# Patient Record
Sex: Female | Born: 1975 | Race: Black or African American | Hispanic: No | Marital: Married | Smoking: Current every day smoker
Health system: Southern US, Community
[De-identification: ages and names within clinical notes are randomized; demographics above are authoritative.]

## PROBLEM LIST (undated history)

## (undated) ENCOUNTER — Emergency Department (HOSPITAL_COMMUNITY)

## (undated) DIAGNOSIS — R42 Dizziness and giddiness: Secondary | ICD-10-CM

## (undated) DIAGNOSIS — R55 Syncope and collapse: Secondary | ICD-10-CM

## (undated) DIAGNOSIS — K219 Gastro-esophageal reflux disease without esophagitis: Secondary | ICD-10-CM

## (undated) DIAGNOSIS — R350 Frequency of micturition: Secondary | ICD-10-CM

## (undated) DIAGNOSIS — G08 Intracranial and intraspinal phlebitis and thrombophlebitis: Secondary | ICD-10-CM

## (undated) DIAGNOSIS — J189 Pneumonia, unspecified organism: Secondary | ICD-10-CM

## (undated) DIAGNOSIS — O24419 Gestational diabetes mellitus in pregnancy, unspecified control: Secondary | ICD-10-CM

## (undated) DIAGNOSIS — I2510 Atherosclerotic heart disease of native coronary artery without angina pectoris: Secondary | ICD-10-CM

## (undated) DIAGNOSIS — N921 Excessive and frequent menstruation with irregular cycle: Secondary | ICD-10-CM

## (undated) DIAGNOSIS — I213 ST elevation (STEMI) myocardial infarction of unspecified site: Secondary | ICD-10-CM

## (undated) DIAGNOSIS — I249 Acute ischemic heart disease, unspecified: Secondary | ICD-10-CM

## (undated) DIAGNOSIS — D573 Sickle-cell trait: Secondary | ICD-10-CM

## (undated) DIAGNOSIS — R011 Cardiac murmur, unspecified: Secondary | ICD-10-CM

## (undated) DIAGNOSIS — I5022 Chronic systolic (congestive) heart failure: Secondary | ICD-10-CM

## (undated) DIAGNOSIS — F17200 Nicotine dependence, unspecified, uncomplicated: Secondary | ICD-10-CM

## (undated) DIAGNOSIS — K8020 Calculus of gallbladder without cholecystitis without obstruction: Secondary | ICD-10-CM

## (undated) DIAGNOSIS — H5500 Unspecified nystagmus: Secondary | ICD-10-CM

## (undated) DIAGNOSIS — R070 Pain in throat: Secondary | ICD-10-CM

## (undated) DIAGNOSIS — I10 Essential (primary) hypertension: Secondary | ICD-10-CM

## (undated) DIAGNOSIS — J069 Acute upper respiratory infection, unspecified: Secondary | ICD-10-CM

## (undated) DIAGNOSIS — B351 Tinea unguium: Secondary | ICD-10-CM

## (undated) DIAGNOSIS — E669 Obesity, unspecified: Secondary | ICD-10-CM

## (undated) DIAGNOSIS — G43909 Migraine, unspecified, not intractable, without status migrainosus: Secondary | ICD-10-CM

## (undated) HISTORY — DX: Calculus of gallbladder without cholecystitis without obstruction: K80.20

## (undated) HISTORY — DX: Acute upper respiratory infection, unspecified: J06.9

## (undated) HISTORY — DX: Tinea unguium: B35.1

## (undated) HISTORY — DX: Nicotine dependence, unspecified, uncomplicated: F17.200

## (undated) HISTORY — DX: Morbid (severe) obesity due to excess calories: E66.01

## (undated) HISTORY — DX: Syncope and collapse: R55

## (undated) HISTORY — DX: ST elevation (STEMI) myocardial infarction of unspecified site: I21.3

## (undated) HISTORY — DX: Dizziness and giddiness: R42

## (undated) HISTORY — DX: Chronic systolic (congestive) heart failure: I50.22

## (undated) HISTORY — DX: Pain in throat: R07.0

## (undated) HISTORY — DX: Unspecified nystagmus: H55.00

## (undated) HISTORY — DX: Excessive and frequent menstruation with irregular cycle: N92.1

## (undated) HISTORY — DX: Frequency of micturition: R35.0

## (undated) HISTORY — DX: Obesity, unspecified: E66.9

---

## 1996-04-19 HISTORY — PX: WISDOM TOOTH EXTRACTION: SHX21

## 1997-07-25 ENCOUNTER — Emergency Department (HOSPITAL_COMMUNITY): Admission: EM | Admit: 1997-07-25 | Discharge: 1997-07-25 | Payer: Self-pay | Admitting: Emergency Medicine

## 1997-10-09 ENCOUNTER — Encounter: Admission: RE | Admit: 1997-10-09 | Discharge: 1997-10-09 | Payer: Self-pay | Admitting: Family Medicine

## 1997-10-22 ENCOUNTER — Encounter: Admission: RE | Admit: 1997-10-22 | Discharge: 1997-10-22 | Payer: Self-pay | Admitting: Family Medicine

## 1998-02-21 ENCOUNTER — Encounter: Admission: RE | Admit: 1998-02-21 | Discharge: 1998-02-21 | Payer: Self-pay | Admitting: Family Medicine

## 1998-05-07 ENCOUNTER — Encounter: Admission: RE | Admit: 1998-05-07 | Discharge: 1998-05-07 | Payer: Self-pay | Admitting: Family Medicine

## 1998-05-22 ENCOUNTER — Encounter: Admission: RE | Admit: 1998-05-22 | Discharge: 1998-05-22 | Payer: Self-pay | Admitting: Family Medicine

## 1998-06-02 ENCOUNTER — Encounter: Admission: RE | Admit: 1998-06-02 | Discharge: 1998-06-02 | Payer: Self-pay | Admitting: Sports Medicine

## 1998-06-26 ENCOUNTER — Inpatient Hospital Stay (HOSPITAL_COMMUNITY): Admission: AD | Admit: 1998-06-26 | Discharge: 1998-06-26 | Payer: Self-pay | Admitting: Obstetrics & Gynecology

## 1998-07-08 ENCOUNTER — Inpatient Hospital Stay (HOSPITAL_COMMUNITY): Admission: AD | Admit: 1998-07-08 | Discharge: 1998-07-08 | Payer: Self-pay | Admitting: *Deleted

## 1998-07-16 ENCOUNTER — Encounter: Admission: RE | Admit: 1998-07-16 | Discharge: 1998-07-16 | Payer: Self-pay | Admitting: Family Medicine

## 1998-07-16 ENCOUNTER — Other Ambulatory Visit: Admission: RE | Admit: 1998-07-16 | Discharge: 1998-07-16 | Payer: Self-pay | Admitting: Obstetrics & Gynecology

## 1998-07-23 ENCOUNTER — Encounter: Admission: RE | Admit: 1998-07-23 | Discharge: 1998-07-23 | Payer: Self-pay | Admitting: Family Medicine

## 1998-09-12 ENCOUNTER — Inpatient Hospital Stay (HOSPITAL_COMMUNITY): Admission: AD | Admit: 1998-09-12 | Discharge: 1998-09-12 | Payer: Self-pay | Admitting: Obstetrics & Gynecology

## 1998-10-06 ENCOUNTER — Emergency Department (HOSPITAL_COMMUNITY): Admission: EM | Admit: 1998-10-06 | Discharge: 1998-10-06 | Payer: Self-pay | Admitting: Emergency Medicine

## 1998-11-06 ENCOUNTER — Encounter: Admission: RE | Admit: 1998-11-06 | Discharge: 1998-11-06 | Payer: Self-pay | Admitting: Family Medicine

## 1999-01-05 ENCOUNTER — Inpatient Hospital Stay (HOSPITAL_COMMUNITY): Admission: AD | Admit: 1999-01-05 | Discharge: 1999-01-05 | Payer: Self-pay | Admitting: Obstetrics

## 1999-02-09 ENCOUNTER — Encounter: Admission: RE | Admit: 1999-02-09 | Discharge: 1999-02-09 | Payer: Self-pay | Admitting: Family Medicine

## 1999-03-09 ENCOUNTER — Emergency Department (HOSPITAL_COMMUNITY): Admission: EM | Admit: 1999-03-09 | Discharge: 1999-03-09 | Payer: Self-pay | Admitting: Emergency Medicine

## 1999-03-10 ENCOUNTER — Encounter: Payer: Self-pay | Admitting: Emergency Medicine

## 1999-07-07 ENCOUNTER — Emergency Department (HOSPITAL_COMMUNITY): Admission: EM | Admit: 1999-07-07 | Discharge: 1999-07-07 | Payer: Self-pay

## 1999-07-13 ENCOUNTER — Encounter: Admission: RE | Admit: 1999-07-13 | Discharge: 1999-07-13 | Payer: Self-pay | Admitting: Family Medicine

## 1999-08-26 ENCOUNTER — Inpatient Hospital Stay (HOSPITAL_COMMUNITY): Admission: EM | Admit: 1999-08-26 | Discharge: 1999-08-26 | Payer: Self-pay | Admitting: Obstetrics

## 1999-12-29 ENCOUNTER — Emergency Department (HOSPITAL_COMMUNITY): Admission: EM | Admit: 1999-12-29 | Discharge: 1999-12-29 | Payer: Self-pay | Admitting: *Deleted

## 1999-12-29 ENCOUNTER — Encounter: Payer: Self-pay | Admitting: Emergency Medicine

## 2000-02-03 ENCOUNTER — Encounter: Admission: RE | Admit: 2000-02-03 | Discharge: 2000-02-03 | Payer: Self-pay | Admitting: Family Medicine

## 2000-04-15 ENCOUNTER — Encounter: Payer: Self-pay | Admitting: Obstetrics

## 2000-04-15 ENCOUNTER — Inpatient Hospital Stay (HOSPITAL_COMMUNITY): Admission: AD | Admit: 2000-04-15 | Discharge: 2000-04-15 | Payer: Self-pay | Admitting: Obstetrics & Gynecology

## 2000-04-28 ENCOUNTER — Other Ambulatory Visit: Admission: RE | Admit: 2000-04-28 | Discharge: 2000-04-28 | Payer: Self-pay | Admitting: *Deleted

## 2000-06-24 ENCOUNTER — Inpatient Hospital Stay (HOSPITAL_COMMUNITY): Admission: AD | Admit: 2000-06-24 | Discharge: 2000-07-01 | Payer: Self-pay | Admitting: Obstetrics & Gynecology

## 2000-06-24 ENCOUNTER — Encounter: Payer: Self-pay | Admitting: Obstetrics & Gynecology

## 2000-07-02 ENCOUNTER — Inpatient Hospital Stay (HOSPITAL_COMMUNITY): Admission: AD | Admit: 2000-07-02 | Discharge: 2000-07-02 | Payer: Self-pay | Admitting: Obstetrics

## 2000-07-06 ENCOUNTER — Inpatient Hospital Stay (HOSPITAL_COMMUNITY): Admission: AD | Admit: 2000-07-06 | Discharge: 2000-07-06 | Payer: Self-pay | Admitting: Obstetrics

## 2000-07-06 ENCOUNTER — Encounter (INDEPENDENT_AMBULATORY_CARE_PROVIDER_SITE_OTHER): Payer: Self-pay

## 2000-07-09 ENCOUNTER — Inpatient Hospital Stay (HOSPITAL_COMMUNITY): Admission: AD | Admit: 2000-07-09 | Discharge: 2000-07-11 | Payer: Self-pay | Admitting: *Deleted

## 2000-07-09 ENCOUNTER — Encounter: Payer: Self-pay | Admitting: *Deleted

## 2000-07-13 ENCOUNTER — Encounter: Admission: RE | Admit: 2000-07-13 | Discharge: 2000-07-13 | Payer: Self-pay | Admitting: Obstetrics & Gynecology

## 2000-07-13 ENCOUNTER — Inpatient Hospital Stay (HOSPITAL_COMMUNITY): Admission: AD | Admit: 2000-07-13 | Discharge: 2000-07-13 | Payer: Self-pay | Admitting: Obstetrics

## 2000-07-13 ENCOUNTER — Encounter: Payer: Self-pay | Admitting: Obstetrics

## 2000-07-14 ENCOUNTER — Encounter (INDEPENDENT_AMBULATORY_CARE_PROVIDER_SITE_OTHER): Payer: Self-pay | Admitting: Specialist

## 2000-07-14 ENCOUNTER — Inpatient Hospital Stay (HOSPITAL_COMMUNITY): Admission: AD | Admit: 2000-07-14 | Discharge: 2000-07-15 | Payer: Self-pay | Admitting: Obstetrics

## 2000-09-24 ENCOUNTER — Inpatient Hospital Stay (HOSPITAL_COMMUNITY): Admission: AD | Admit: 2000-09-24 | Discharge: 2000-09-24 | Payer: Self-pay | Admitting: Obstetrics

## 2000-10-04 ENCOUNTER — Encounter: Admission: RE | Admit: 2000-10-04 | Discharge: 2000-10-04 | Payer: Self-pay | Admitting: Family Medicine

## 2001-07-15 ENCOUNTER — Inpatient Hospital Stay (HOSPITAL_COMMUNITY): Admission: AD | Admit: 2001-07-15 | Discharge: 2001-07-15 | Payer: Self-pay | Admitting: *Deleted

## 2001-07-24 ENCOUNTER — Encounter: Admission: RE | Admit: 2001-07-24 | Discharge: 2001-07-24 | Payer: Self-pay | Admitting: Family Medicine

## 2001-07-31 ENCOUNTER — Ambulatory Visit (HOSPITAL_COMMUNITY): Admission: RE | Admit: 2001-07-31 | Discharge: 2001-07-31 | Payer: Self-pay | Admitting: Family Medicine

## 2001-08-08 ENCOUNTER — Encounter: Admission: RE | Admit: 2001-08-08 | Discharge: 2001-08-08 | Payer: Self-pay | Admitting: Family Medicine

## 2001-09-07 ENCOUNTER — Encounter: Admission: RE | Admit: 2001-09-07 | Discharge: 2001-09-07 | Payer: Self-pay | Admitting: Family Medicine

## 2001-09-07 ENCOUNTER — Inpatient Hospital Stay (HOSPITAL_COMMUNITY): Admission: AD | Admit: 2001-09-07 | Discharge: 2001-09-07 | Payer: Self-pay | Admitting: *Deleted

## 2001-09-07 ENCOUNTER — Other Ambulatory Visit: Admission: RE | Admit: 2001-09-07 | Discharge: 2001-09-07 | Payer: Self-pay | Admitting: Family Medicine

## 2001-10-16 ENCOUNTER — Encounter: Admission: RE | Admit: 2001-10-16 | Discharge: 2001-10-16 | Payer: Self-pay | Admitting: Family Medicine

## 2001-10-18 ENCOUNTER — Encounter: Admission: RE | Admit: 2001-10-18 | Discharge: 2001-10-18 | Payer: Self-pay | Admitting: Family Medicine

## 2001-10-24 ENCOUNTER — Ambulatory Visit (HOSPITAL_COMMUNITY): Admission: RE | Admit: 2001-10-24 | Discharge: 2001-10-24 | Payer: Self-pay | Admitting: Family Medicine

## 2001-11-15 ENCOUNTER — Encounter: Admission: RE | Admit: 2001-11-15 | Discharge: 2001-11-15 | Payer: Self-pay | Admitting: Family Medicine

## 2001-11-16 ENCOUNTER — Encounter: Admission: RE | Admit: 2001-11-16 | Discharge: 2001-11-16 | Payer: Self-pay | Admitting: Obstetrics and Gynecology

## 2001-11-20 ENCOUNTER — Encounter: Admission: RE | Admit: 2001-11-20 | Discharge: 2001-11-20 | Payer: Self-pay | Admitting: Family Medicine

## 2001-11-27 ENCOUNTER — Encounter: Admission: RE | Admit: 2001-11-27 | Discharge: 2001-11-27 | Payer: Self-pay | Admitting: Family Medicine

## 2001-12-13 ENCOUNTER — Encounter: Admission: RE | Admit: 2001-12-13 | Discharge: 2001-12-13 | Payer: Self-pay | Admitting: Family Medicine

## 2001-12-28 ENCOUNTER — Encounter: Admission: RE | Admit: 2001-12-28 | Discharge: 2001-12-28 | Payer: Self-pay | Admitting: Family Medicine

## 2002-01-02 ENCOUNTER — Encounter: Admission: RE | Admit: 2002-01-02 | Discharge: 2002-01-02 | Payer: Self-pay | Admitting: Family Medicine

## 2002-01-12 ENCOUNTER — Encounter: Admission: RE | Admit: 2002-01-12 | Discharge: 2002-01-12 | Payer: Self-pay | Admitting: Family Medicine

## 2002-01-15 ENCOUNTER — Encounter: Admission: RE | Admit: 2002-01-15 | Discharge: 2002-01-15 | Payer: Self-pay | Admitting: Sports Medicine

## 2002-01-20 ENCOUNTER — Encounter: Payer: Self-pay | Admitting: *Deleted

## 2002-01-20 ENCOUNTER — Inpatient Hospital Stay (HOSPITAL_COMMUNITY): Admission: AD | Admit: 2002-01-20 | Discharge: 2002-01-23 | Payer: Self-pay | Admitting: *Deleted

## 2002-01-24 ENCOUNTER — Inpatient Hospital Stay (HOSPITAL_COMMUNITY): Admission: AD | Admit: 2002-01-24 | Discharge: 2002-01-24 | Payer: Self-pay | Admitting: Obstetrics and Gynecology

## 2002-01-26 ENCOUNTER — Encounter: Admission: RE | Admit: 2002-01-26 | Discharge: 2002-01-26 | Payer: Self-pay | Admitting: Family Medicine

## 2002-01-30 ENCOUNTER — Encounter: Admission: RE | Admit: 2002-01-30 | Discharge: 2002-01-30 | Payer: Self-pay | Admitting: Obstetrics and Gynecology

## 2002-02-08 ENCOUNTER — Encounter: Admission: RE | Admit: 2002-02-08 | Discharge: 2002-02-08 | Payer: Self-pay | Admitting: *Deleted

## 2002-02-09 ENCOUNTER — Encounter: Admission: RE | Admit: 2002-02-09 | Discharge: 2002-02-09 | Payer: Self-pay | Admitting: Family Medicine

## 2002-02-22 ENCOUNTER — Encounter: Admission: RE | Admit: 2002-02-22 | Discharge: 2002-02-22 | Payer: Self-pay | Admitting: *Deleted

## 2002-02-22 ENCOUNTER — Inpatient Hospital Stay (HOSPITAL_COMMUNITY): Admission: AD | Admit: 2002-02-22 | Discharge: 2002-02-22 | Payer: Self-pay | Admitting: *Deleted

## 2002-02-26 ENCOUNTER — Encounter: Admission: RE | Admit: 2002-02-26 | Discharge: 2002-02-26 | Payer: Self-pay | Admitting: Family Medicine

## 2002-02-28 ENCOUNTER — Inpatient Hospital Stay (HOSPITAL_COMMUNITY): Admission: RE | Admit: 2002-02-28 | Discharge: 2002-02-28 | Payer: Self-pay | Admitting: *Deleted

## 2002-03-06 ENCOUNTER — Encounter: Admission: RE | Admit: 2002-03-06 | Discharge: 2002-03-06 | Payer: Self-pay | Admitting: Sports Medicine

## 2002-03-07 ENCOUNTER — Encounter: Admission: RE | Admit: 2002-03-07 | Discharge: 2002-03-07 | Payer: Self-pay | Admitting: *Deleted

## 2002-03-07 ENCOUNTER — Encounter (HOSPITAL_COMMUNITY): Admission: RE | Admit: 2002-03-07 | Discharge: 2002-03-14 | Payer: Self-pay | Admitting: *Deleted

## 2002-03-13 ENCOUNTER — Encounter: Admission: RE | Admit: 2002-03-13 | Discharge: 2002-03-13 | Payer: Self-pay | Admitting: Family Medicine

## 2002-03-14 ENCOUNTER — Encounter: Admission: RE | Admit: 2002-03-14 | Discharge: 2002-03-14 | Payer: Self-pay | Admitting: *Deleted

## 2002-03-19 ENCOUNTER — Inpatient Hospital Stay (HOSPITAL_COMMUNITY): Admission: AD | Admit: 2002-03-19 | Discharge: 2002-03-21 | Payer: Self-pay | Admitting: *Deleted

## 2002-04-19 HISTORY — PX: BARTHOLIN GLAND CYST EXCISION: SHX565

## 2002-05-16 ENCOUNTER — Encounter: Admission: RE | Admit: 2002-05-16 | Discharge: 2002-05-16 | Payer: Self-pay | Admitting: Family Medicine

## 2002-05-30 ENCOUNTER — Encounter: Admission: RE | Admit: 2002-05-30 | Discharge: 2002-05-30 | Payer: Self-pay | Admitting: Family Medicine

## 2002-06-25 ENCOUNTER — Encounter: Admission: RE | Admit: 2002-06-25 | Discharge: 2002-06-25 | Payer: Self-pay | Admitting: Family Medicine

## 2002-07-02 ENCOUNTER — Encounter: Admission: RE | Admit: 2002-07-02 | Discharge: 2002-07-02 | Payer: Self-pay | Admitting: Family Medicine

## 2002-07-09 ENCOUNTER — Encounter: Admission: RE | Admit: 2002-07-09 | Discharge: 2002-07-09 | Payer: Self-pay | Admitting: Family Medicine

## 2002-07-30 ENCOUNTER — Encounter: Admission: RE | Admit: 2002-07-30 | Discharge: 2002-07-30 | Payer: Self-pay | Admitting: Sports Medicine

## 2002-08-28 ENCOUNTER — Encounter: Admission: RE | Admit: 2002-08-28 | Discharge: 2002-08-28 | Payer: Self-pay | Admitting: Family Medicine

## 2002-09-25 ENCOUNTER — Encounter: Admission: RE | Admit: 2002-09-25 | Discharge: 2002-09-25 | Payer: Self-pay | Admitting: Family Medicine

## 2002-11-28 ENCOUNTER — Encounter: Admission: RE | Admit: 2002-11-28 | Discharge: 2002-11-28 | Payer: Self-pay | Admitting: Family Medicine

## 2003-04-01 ENCOUNTER — Encounter: Admission: RE | Admit: 2003-04-01 | Discharge: 2003-04-01 | Payer: Self-pay | Admitting: Family Medicine

## 2003-04-09 ENCOUNTER — Encounter: Admission: RE | Admit: 2003-04-09 | Discharge: 2003-04-09 | Payer: Self-pay | Admitting: Family Medicine

## 2003-06-24 ENCOUNTER — Encounter: Admission: RE | Admit: 2003-06-24 | Discharge: 2003-06-24 | Payer: Self-pay | Admitting: Family Medicine

## 2003-07-07 ENCOUNTER — Emergency Department (HOSPITAL_COMMUNITY): Admission: EM | Admit: 2003-07-07 | Discharge: 2003-07-07 | Payer: Self-pay | Admitting: *Deleted

## 2003-07-07 ENCOUNTER — Emergency Department (HOSPITAL_COMMUNITY): Admission: AD | Admit: 2003-07-07 | Discharge: 2003-07-07 | Payer: Self-pay | Admitting: Family Medicine

## 2003-09-04 ENCOUNTER — Other Ambulatory Visit: Admission: RE | Admit: 2003-09-04 | Discharge: 2003-09-04 | Payer: Self-pay | Admitting: Family Medicine

## 2003-09-04 ENCOUNTER — Encounter (INDEPENDENT_AMBULATORY_CARE_PROVIDER_SITE_OTHER): Payer: Self-pay | Admitting: *Deleted

## 2003-09-04 ENCOUNTER — Encounter: Admission: RE | Admit: 2003-09-04 | Discharge: 2003-09-04 | Payer: Self-pay | Admitting: Family Medicine

## 2003-09-19 ENCOUNTER — Encounter: Admission: RE | Admit: 2003-09-19 | Discharge: 2003-09-19 | Payer: Self-pay | Admitting: Family Medicine

## 2004-04-19 HISTORY — PX: EXCISIONAL HEMORRHOIDECTOMY: SHX1541

## 2004-04-27 ENCOUNTER — Ambulatory Visit: Payer: Self-pay | Admitting: Family Medicine

## 2004-04-29 ENCOUNTER — Ambulatory Visit: Payer: Self-pay | Admitting: Family Medicine

## 2004-06-05 ENCOUNTER — Ambulatory Visit: Payer: Self-pay | Admitting: Family Medicine

## 2004-06-05 ENCOUNTER — Ambulatory Visit (HOSPITAL_COMMUNITY): Admission: RE | Admit: 2004-06-05 | Discharge: 2004-06-05 | Payer: Self-pay | Admitting: *Deleted

## 2004-07-21 ENCOUNTER — Emergency Department (HOSPITAL_COMMUNITY): Admission: EM | Admit: 2004-07-21 | Discharge: 2004-07-21 | Payer: Self-pay | Admitting: Family Medicine

## 2004-10-12 ENCOUNTER — Emergency Department (HOSPITAL_COMMUNITY): Admission: EM | Admit: 2004-10-12 | Discharge: 2004-10-12 | Payer: Self-pay | Admitting: Family Medicine

## 2004-10-20 ENCOUNTER — Emergency Department (HOSPITAL_COMMUNITY): Admission: EM | Admit: 2004-10-20 | Discharge: 2004-10-21 | Payer: Self-pay | Admitting: Emergency Medicine

## 2005-01-02 ENCOUNTER — Emergency Department (HOSPITAL_COMMUNITY): Admission: EM | Admit: 2005-01-02 | Discharge: 2005-01-03 | Payer: Self-pay | Admitting: Emergency Medicine

## 2005-01-04 ENCOUNTER — Encounter: Payer: Self-pay | Admitting: Obstetrics and Gynecology

## 2005-01-04 ENCOUNTER — Emergency Department (HOSPITAL_COMMUNITY): Admission: EM | Admit: 2005-01-04 | Discharge: 2005-01-04 | Payer: Self-pay | Admitting: Emergency Medicine

## 2005-01-06 ENCOUNTER — Inpatient Hospital Stay (HOSPITAL_COMMUNITY): Admission: AD | Admit: 2005-01-06 | Discharge: 2005-01-06 | Payer: Self-pay | Admitting: Obstetrics and Gynecology

## 2005-01-19 ENCOUNTER — Ambulatory Visit: Payer: Self-pay | Admitting: Sports Medicine

## 2005-01-27 ENCOUNTER — Ambulatory Visit (HOSPITAL_COMMUNITY): Admission: RE | Admit: 2005-01-27 | Discharge: 2005-01-27 | Payer: Self-pay | Admitting: Internal Medicine

## 2005-02-17 ENCOUNTER — Encounter (INDEPENDENT_AMBULATORY_CARE_PROVIDER_SITE_OTHER): Payer: Self-pay | Admitting: *Deleted

## 2005-02-17 ENCOUNTER — Encounter (INDEPENDENT_AMBULATORY_CARE_PROVIDER_SITE_OTHER): Payer: Self-pay | Admitting: Family Medicine

## 2005-02-17 LAB — CONVERTED CEMR LAB: Pap Smear: NORMAL

## 2005-02-19 ENCOUNTER — Inpatient Hospital Stay (HOSPITAL_COMMUNITY): Admission: AD | Admit: 2005-02-19 | Discharge: 2005-02-20 | Payer: Self-pay | Admitting: *Deleted

## 2005-02-19 ENCOUNTER — Ambulatory Visit: Payer: Self-pay | Admitting: Obstetrics and Gynecology

## 2005-02-26 ENCOUNTER — Ambulatory Visit: Payer: Self-pay | Admitting: Family Medicine

## 2005-03-28 ENCOUNTER — Emergency Department (HOSPITAL_COMMUNITY): Admission: EM | Admit: 2005-03-28 | Discharge: 2005-03-28 | Payer: Self-pay | Admitting: Emergency Medicine

## 2005-04-01 ENCOUNTER — Ambulatory Visit: Payer: Self-pay | Admitting: Family Medicine

## 2005-04-02 ENCOUNTER — Ambulatory Visit (HOSPITAL_COMMUNITY): Admission: RE | Admit: 2005-04-02 | Discharge: 2005-04-02 | Payer: Self-pay | Admitting: Family Medicine

## 2005-04-16 ENCOUNTER — Ambulatory Visit (HOSPITAL_COMMUNITY): Admission: RE | Admit: 2005-04-16 | Discharge: 2005-04-16 | Payer: Self-pay | Admitting: Family Medicine

## 2005-04-19 HISTORY — PX: TUBAL LIGATION: SHX77

## 2005-04-27 ENCOUNTER — Ambulatory Visit: Payer: Self-pay | Admitting: Family Medicine

## 2005-05-27 ENCOUNTER — Ambulatory Visit: Payer: Self-pay | Admitting: Family Medicine

## 2005-06-01 ENCOUNTER — Ambulatory Visit: Payer: Self-pay | Admitting: Sports Medicine

## 2005-06-02 ENCOUNTER — Ambulatory Visit: Payer: Self-pay | Admitting: Family Medicine

## 2005-06-24 ENCOUNTER — Ambulatory Visit: Payer: Self-pay | Admitting: Family Medicine

## 2005-07-08 ENCOUNTER — Ambulatory Visit: Payer: Self-pay | Admitting: Family Medicine

## 2005-07-20 ENCOUNTER — Ambulatory Visit: Payer: Self-pay | Admitting: Family Medicine

## 2005-08-03 ENCOUNTER — Ambulatory Visit: Payer: Self-pay | Admitting: Sports Medicine

## 2005-08-19 ENCOUNTER — Ambulatory Visit: Payer: Self-pay | Admitting: Family Medicine

## 2005-08-26 ENCOUNTER — Ambulatory Visit: Payer: Self-pay | Admitting: Family Medicine

## 2005-09-01 ENCOUNTER — Ambulatory Visit: Payer: Self-pay | Admitting: Family Medicine

## 2005-09-08 ENCOUNTER — Ambulatory Visit: Payer: Self-pay | Admitting: Family Medicine

## 2005-09-08 ENCOUNTER — Inpatient Hospital Stay (HOSPITAL_COMMUNITY): Admission: AD | Admit: 2005-09-08 | Discharge: 2005-09-10 | Payer: Self-pay | Admitting: Family Medicine

## 2005-10-21 ENCOUNTER — Ambulatory Visit: Payer: Self-pay | Admitting: Family Medicine

## 2006-06-01 ENCOUNTER — Emergency Department (HOSPITAL_COMMUNITY): Admission: EM | Admit: 2006-06-01 | Discharge: 2006-06-01 | Payer: Self-pay | Admitting: Family Medicine

## 2006-06-12 ENCOUNTER — Emergency Department (HOSPITAL_COMMUNITY): Admission: EM | Admit: 2006-06-12 | Discharge: 2006-06-12 | Payer: Self-pay | Admitting: Family Medicine

## 2006-06-16 DIAGNOSIS — N921 Excessive and frequent menstruation with irregular cycle: Secondary | ICD-10-CM | POA: Insufficient documentation

## 2006-06-16 DIAGNOSIS — F17200 Nicotine dependence, unspecified, uncomplicated: Secondary | ICD-10-CM | POA: Insufficient documentation

## 2006-06-16 DIAGNOSIS — D573 Sickle-cell trait: Secondary | ICD-10-CM | POA: Insufficient documentation

## 2006-06-16 DIAGNOSIS — E669 Obesity, unspecified: Secondary | ICD-10-CM | POA: Insufficient documentation

## 2006-06-17 ENCOUNTER — Encounter (INDEPENDENT_AMBULATORY_CARE_PROVIDER_SITE_OTHER): Payer: Self-pay | Admitting: *Deleted

## 2006-07-04 ENCOUNTER — Encounter: Payer: Self-pay | Admitting: *Deleted

## 2006-07-04 ENCOUNTER — Telehealth (INDEPENDENT_AMBULATORY_CARE_PROVIDER_SITE_OTHER): Payer: Self-pay | Admitting: *Deleted

## 2006-07-04 ENCOUNTER — Emergency Department (HOSPITAL_COMMUNITY): Admission: EM | Admit: 2006-07-04 | Discharge: 2006-07-04 | Payer: Self-pay | Admitting: Emergency Medicine

## 2006-07-05 ENCOUNTER — Encounter: Payer: Self-pay | Admitting: Family Medicine

## 2006-07-05 ENCOUNTER — Ambulatory Visit: Payer: Self-pay | Admitting: Family Medicine

## 2006-07-05 ENCOUNTER — Encounter (INDEPENDENT_AMBULATORY_CARE_PROVIDER_SITE_OTHER): Payer: Self-pay | Admitting: Family Medicine

## 2006-07-05 LAB — CONVERTED CEMR LAB
BUN: 9 mg/dL (ref 6–23)
CO2: 25 meq/L (ref 19–32)
Calcium: 9.9 mg/dL (ref 8.4–10.5)
Chloride: 106 meq/L (ref 96–112)
Creatinine, Ser: 0.66 mg/dL (ref 0.40–1.20)
TSH: 0.671 microintl units/mL (ref 0.350–5.50)
Total Bilirubin: 0.5 mg/dL (ref 0.3–1.2)

## 2006-07-11 ENCOUNTER — Emergency Department (HOSPITAL_COMMUNITY): Admission: EM | Admit: 2006-07-11 | Discharge: 2006-07-11 | Payer: Self-pay | Admitting: Emergency Medicine

## 2006-07-11 ENCOUNTER — Telehealth (INDEPENDENT_AMBULATORY_CARE_PROVIDER_SITE_OTHER): Payer: Self-pay | Admitting: *Deleted

## 2006-07-12 ENCOUNTER — Telehealth: Payer: Self-pay | Admitting: *Deleted

## 2006-07-13 ENCOUNTER — Ambulatory Visit: Payer: Self-pay | Admitting: Sports Medicine

## 2006-07-13 DIAGNOSIS — R12 Heartburn: Secondary | ICD-10-CM | POA: Insufficient documentation

## 2006-07-13 DIAGNOSIS — K8020 Calculus of gallbladder without cholecystitis without obstruction: Secondary | ICD-10-CM | POA: Insufficient documentation

## 2006-07-13 DIAGNOSIS — J069 Acute upper respiratory infection, unspecified: Secondary | ICD-10-CM | POA: Insufficient documentation

## 2006-09-19 ENCOUNTER — Ambulatory Visit: Payer: Self-pay | Admitting: Sports Medicine

## 2006-09-19 DIAGNOSIS — B351 Tinea unguium: Secondary | ICD-10-CM | POA: Insufficient documentation

## 2006-09-28 ENCOUNTER — Telehealth (INDEPENDENT_AMBULATORY_CARE_PROVIDER_SITE_OTHER): Payer: Self-pay | Admitting: Family Medicine

## 2006-10-03 ENCOUNTER — Telehealth (INDEPENDENT_AMBULATORY_CARE_PROVIDER_SITE_OTHER): Payer: Self-pay | Admitting: *Deleted

## 2006-10-06 ENCOUNTER — Telehealth (INDEPENDENT_AMBULATORY_CARE_PROVIDER_SITE_OTHER): Payer: Self-pay | Admitting: *Deleted

## 2006-10-07 ENCOUNTER — Emergency Department (HOSPITAL_COMMUNITY): Admission: EM | Admit: 2006-10-07 | Discharge: 2006-10-07 | Payer: Self-pay | Admitting: Emergency Medicine

## 2006-10-17 ENCOUNTER — Telehealth: Payer: Self-pay | Admitting: *Deleted

## 2006-10-19 ENCOUNTER — Telehealth: Payer: Self-pay | Admitting: *Deleted

## 2006-10-24 ENCOUNTER — Ambulatory Visit: Payer: Self-pay | Admitting: Family Medicine

## 2006-10-24 ENCOUNTER — Telehealth: Payer: Self-pay | Admitting: *Deleted

## 2006-10-24 ENCOUNTER — Encounter (INDEPENDENT_AMBULATORY_CARE_PROVIDER_SITE_OTHER): Payer: Self-pay | Admitting: Family Medicine

## 2006-10-24 DIAGNOSIS — R55 Syncope and collapse: Secondary | ICD-10-CM | POA: Insufficient documentation

## 2006-10-24 DIAGNOSIS — R070 Pain in throat: Secondary | ICD-10-CM | POA: Insufficient documentation

## 2007-03-02 ENCOUNTER — Encounter (INDEPENDENT_AMBULATORY_CARE_PROVIDER_SITE_OTHER): Payer: Self-pay | Admitting: Family Medicine

## 2008-02-26 ENCOUNTER — Encounter: Payer: Self-pay | Admitting: *Deleted

## 2008-07-10 ENCOUNTER — Encounter (INDEPENDENT_AMBULATORY_CARE_PROVIDER_SITE_OTHER): Payer: Self-pay | Admitting: Family Medicine

## 2008-07-10 ENCOUNTER — Ambulatory Visit: Payer: Self-pay | Admitting: Family Medicine

## 2008-07-10 ENCOUNTER — Encounter: Payer: Self-pay | Admitting: Family Medicine

## 2008-07-10 DIAGNOSIS — R350 Frequency of micturition: Secondary | ICD-10-CM | POA: Insufficient documentation

## 2008-07-10 LAB — CONVERTED CEMR LAB
ALT: 14 units/L (ref 0–35)
Bilirubin Urine: NEGATIVE
CO2: 25 meq/L (ref 19–32)
Calcium: 9.3 mg/dL (ref 8.4–10.5)
Chloride: 104 meq/L (ref 96–112)
Cholesterol: 164 mg/dL (ref 0–200)
Creatinine, Ser: 0.86 mg/dL (ref 0.40–1.20)
GC Probe Amp, Genital: NEGATIVE
Glucose, Urine, Semiquant: NEGATIVE
HCT: 36.1 % (ref 36.0–46.0)
Hemoglobin: 12.3 g/dL (ref 12.0–15.0)
Platelets: 381 10*3/uL (ref 150–400)
RBC: 4.4 M/uL (ref 3.87–5.11)
RPR Ser Ql: REACTIVE — AB
RPR Titer: 1:1 {titer}
Specific Gravity, Urine: 1.02
Total CHOL/HDL Ratio: 4
WBC: 11.1 10*3/uL — ABNORMAL HIGH (ref 4.0–10.5)
pH: 6

## 2008-07-11 ENCOUNTER — Encounter: Payer: Self-pay | Admitting: Family Medicine

## 2008-07-11 ENCOUNTER — Telehealth: Payer: Self-pay | Admitting: Family Medicine

## 2008-08-21 ENCOUNTER — Telehealth: Payer: Self-pay | Admitting: Family Medicine

## 2008-08-21 ENCOUNTER — Ambulatory Visit: Payer: Self-pay | Admitting: Family Medicine

## 2008-08-22 ENCOUNTER — Encounter: Payer: Self-pay | Admitting: Family Medicine

## 2008-08-29 ENCOUNTER — Ambulatory Visit: Payer: Self-pay | Admitting: Family Medicine

## 2008-08-29 DIAGNOSIS — R42 Dizziness and giddiness: Secondary | ICD-10-CM | POA: Insufficient documentation

## 2008-09-13 ENCOUNTER — Telehealth: Payer: Self-pay | Admitting: *Deleted

## 2009-02-27 ENCOUNTER — Emergency Department (HOSPITAL_COMMUNITY): Admission: EM | Admit: 2009-02-27 | Discharge: 2009-02-27 | Payer: Self-pay | Admitting: Emergency Medicine

## 2010-02-02 ENCOUNTER — Ambulatory Visit: Payer: Self-pay | Admitting: Family Medicine

## 2010-03-26 ENCOUNTER — Emergency Department (HOSPITAL_COMMUNITY): Admission: EM | Admit: 2010-03-26 | Discharge: 2009-07-02 | Payer: Self-pay | Admitting: Emergency Medicine

## 2010-05-15 ENCOUNTER — Emergency Department (HOSPITAL_COMMUNITY)
Admission: EM | Admit: 2010-05-15 | Discharge: 2010-05-15 | Payer: Self-pay | Source: Home / Self Care | Admitting: Emergency Medicine

## 2010-05-16 ENCOUNTER — Emergency Department (HOSPITAL_COMMUNITY)
Admission: EM | Admit: 2010-05-16 | Discharge: 2010-05-17 | Payer: Self-pay | Source: Home / Self Care | Admitting: Emergency Medicine

## 2010-05-17 ENCOUNTER — Ambulatory Visit (HOSPITAL_COMMUNITY)
Admission: RE | Admit: 2010-05-17 | Discharge: 2010-05-17 | Payer: Self-pay | Source: Home / Self Care | Attending: Emergency Medicine | Admitting: Emergency Medicine

## 2010-05-18 ENCOUNTER — Encounter: Payer: Self-pay | Admitting: *Deleted

## 2010-05-18 ENCOUNTER — Ambulatory Visit
Admission: RE | Admit: 2010-05-18 | Discharge: 2010-05-18 | Payer: Self-pay | Source: Home / Self Care | Attending: Family Medicine | Admitting: Family Medicine

## 2010-05-18 DIAGNOSIS — M79609 Pain in unspecified limb: Secondary | ICD-10-CM | POA: Insufficient documentation

## 2010-05-20 ENCOUNTER — Encounter: Payer: Self-pay | Admitting: *Deleted

## 2010-05-20 ENCOUNTER — Encounter: Payer: Self-pay | Admitting: Family Medicine

## 2010-05-20 ENCOUNTER — Ambulatory Visit (INDEPENDENT_AMBULATORY_CARE_PROVIDER_SITE_OTHER): Payer: Self-pay | Admitting: Family Medicine

## 2010-05-20 DIAGNOSIS — M79609 Pain in unspecified limb: Secondary | ICD-10-CM

## 2010-05-21 NOTE — Assessment & Plan Note (Signed)
Summary: BP/KH   Vital Signs:  Patient profile:   35 year old female Height:      67 inches Weight:      320.13 pounds BMI:     50.32 BSA:     2.47 Temp:     98.1 degrees F Pulse rate:   67 / minute BP sitting:   125 / 71  Vitals Entered By: Jone Baseman CMA (February 02, 2010 11:59 AM) CC: dizzy, elevated BP  Is Patient Diabetic? No Pain Assessment Patient in pain? no        Primary Care Provider:  . [facility]  CC:  dizzy and elevated BP .  History of Present Illness: 1) Lightheaded / elevated BP?: Single epsiode of lightheadedness at work when she stood up quickly - checked BP with nurse at job, BP was noted to be 190/97 - she is 125 / 71 today. Has history of postural hypotension. Has had Holter monitor in 2008 which showed occasional PACs and PVCs. Reports some "palpitations" - "feels like her heart is beating hard in her neck but not skipping beats".  Reports that she stays well hydrated - most of her liquid intake is in the form of caffeinated sodas as below. Labs from this month  BMET: 136 / 4.1 / 101 / 20 / 20 / 0.65 < 95 Calcium 9.4 LFTS: AST 16, ALT 20 HDL 48, LDL 114 TSH 1.810, free t3 2.6, free T4 9.5 (ALL WNL) UA: spec grav > 1.030, 1+ protien o/w unremarkable  CBC 15.7 > 13.4 / 39.3 < 450   2) Obesity: Drinks large amount (up to two 2L) of Pepsi per day (works for [employer]). Sedentary. Has tried multiple "diets" - slim fast, nutrisystems, weight watchers. Labs from this month at work with A1C 5.4, TSH 1.81  3) Tobacco use: 1/2 pack per day. Precontemplative.   4) Onychomycosis: Toenails bilaterally. Received script for Lamisil but never filled. Would like script refilled.   ROS: Denies chest pain, dyspnea, LE edema, plyuria, polydispia, vision change, focal neuro signs, headache, syncope,   No meds currently on med rec     Habits & Providers  Alcohol-Tobacco-Diet     Tobacco Status: never     Cigarette Packs/Day: 0.5  Allergies  (verified): No Known Drug Allergies  Family History: Reviewed history from 06/16/2006 and no changes required. mother and father have sickle cell trait, multiple family members have diabetes  Social History: Reviewed history from 09/19/2006 and no changes required. single; lives with 3 daughters, husband larry  ; smokes 5-6 cigs/day; Packs/Day:  0.5  Physical Exam  General:  Morbidly obese; nad Head:  Normocephalic and atraumatic without obvious abnormalities. No apparent alopecia or balding. Eyes:  pupils equal, round and reactive to light ,  Mouth:  moist membranes  Neck:  no thryomegaly, no carotid bruits  Lungs:  CTAD w/o wheeze or crackles  Heart:  RRR, no murmurs  Abdomen:  obese, NT/ND Pulses:  2+ radials  Extremities:  no edema  Neurologic:  alert & oriented X3, cranial nerves II-XII intact, and strength normal in all extremities.   Skin:  onychomycosis bilateral toenails    Impression & Recommendations:  Problem # 1:  POSTURAL LIGHTHEADEDNESS (ICD-780.4) Assessment Deteriorated  Likely secondary to mild dehydration (given UA w/ spec grav > 1.030), hemoconcentration on CBC. Advised to drink more water, decrease caffeine intake. Patient to follow with Dr. Karie Schwalbe in one month. Will keep track of episodes in symptom diary.   Orders: [facility]-  Est  Level 4 (99214)  Problem # 2:  DERMATOPHYTOSIS OF NAIL (ICD-110.1) Assessment: Unchanged  Refilled script for terbinafine.   Her updated medication list for this problem includes:    Terbinafine Hcl 250 Mg Tabs (Terbinafine hcl) .Marland Kitchen... Take 1 tab by mouth daily for 12 weeks  Orders: [facility]- Est  Level 4 (02725)  Problem # 3:  OBESITY, MORBID (ICD-278.01) Advised decreased soda and juice intake. Advised increased activity. Reviewed labs. Essentially normal. Will add to flowsheet. Follow with Dr. Karie Schwalbe in one month.   Problem # 4:  TOBACCO DEPENDENCE (ICD-305.1)  Precontemplative. Follow with Dr. Karie Schwalbe. Address again at next appointment.    Orders: [facility]- Est  Level 4 (36644)  Complete Medication List: 1)  Terbinafine Hcl 250 Mg Tabs (Terbinafine hcl) .... Take 1 tab by mouth daily for 12 weeks  Patient Instructions: 1)  Make an appointment with Dr. Karie Schwalbe. in one month 2)  Write down how often you are having palpitations. 3)  Cut back on caffeine - DRINK MORE WATER   Orders Added: 1)  [facility]- Est  Level 4 [03474]

## 2010-05-27 NOTE — Letter (Signed)
Summary: Work Excuse  [facility]  [address]   [place], [place] [address]   Phone: [phone]  Fax: [phone]    Today's Date: May 20, 2010  Name of Patient: Angelica Brown  The above named patient had a medical visit today at:  1:00 pm.  Please take this into consideration when reviewing the time away from work.    Special Instructions:  [  ] None  [  ] To be off the remainder of today, returning to the normal work  schedule tomorrow.  [  ] To be off until the next scheduled appointment on ______________________.  [  ] Other ________________________________________________________________ ________________________________________________________________________   Sincerely yours,   Loralee Pacas CMA

## 2010-05-27 NOTE — Letter (Signed)
Summary: Work Excuse  [facility]  [address]   [place], [place] [address]   Phone: [phone]  Fax: [phone]    Today's Date: May 18, 2010  Name of Patient: Angelica Brown  The above named patient had a medical visit today at:  11:30 am .  Please take this into consideration when reviewing the time away from work.    Special Instructions:  [  ] None  [ x ] To be off the remainder of today, returning to the normal work / school schedule 05/20/2010.  [  ] To be off until the next scheduled appointment on ______________________.  [ ]  Other ________________________________________________________________ ________________________________________________________________________   Sincerely yours,   Loralee Pacas CMA

## 2010-05-27 NOTE — Letter (Signed)
Summary: Work Excuse  [facility]  [address]   [place], [place] [address]   Phone: [phone]  Fax: [phone]    Today's Date: May 20, 2010  Name of Patient: Angelica Brown  The above named patient had a medical visit today.  Please take this into consideration when reviewing the time away from work/school.    Special Instructions:  [  ] None  [  ] To be off the remainder of today, returning to the normal work 05/22/2010.  [  ] To be off until the next scheduled appointment on ______________________.  [  ] Other ________________________________________________________________ ________________________________________________________________________   Sincerely yours,   Loralee Pacas CMA
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
## 2010-05-27 NOTE — Assessment & Plan Note (Signed)
Summary: severe leg pain/eo   Vital Signs:  Patient profile:   35 year old female Height:      67 inches Weight:      318 pounds Temp:     99.5 degrees F oral Pulse rate:   87 / minute Pulse rhythm:   regular BP sitting:   126 / 80  (left arm)  Vitals Entered By: Loralee Pacas CMA (May 18, 2010 2:24 PM)  Primary Provider:  . [facility]   History of Present Illness: 1. right leg pain and swelling Patient has had this pain and swelling that started one week prior. started with tingling in her right toes as well as lower extremity pain that was throbbing in nature, worse at rest, mildly better when walking. she was evaluated in the ER where she had an ultrasound of the right LE to r/o DVT. This was normal and did not show DVT. She has some sensory symptoms, she complains or her right foot feeling dead on light palpation. on deep palpation she is very tender. Her pulses are palpable. she has no diabetes. She does have non-pitting tender edema and varicose veins.   Allergies: No Known Drug Allergies  Review of Systems       see hpi  Physical Exam  Pulses:  R and L carotid,radial,femoral,dorsalis pedis and posterior tibial pulses are full and equal bilaterally Extremities:  right leg edema. varicose veins.  swelling of right calf. tender right lower ext. tense, not hot to touch, no cellulitis.   Impression & Recommendations:  Problem # 1:  LEG PAIN, RIGHT (ICD-729.5) Likely chronic venous insufficiency that has worsened. we will give her compression stockings and OTC NSAIDS. she will follow up in one week. She may need a referral to vascular surgery for arterial studies.  Orders: [facility]- Est Level  3 ([address]109)  Complete Medication List: 1)  Terbinafine Hcl 250 Mg Tabs (Terbinafine hcl) .... Take 1 tab by mouth daily for 12 weeks  Patient Instructions: 1)  follow up in one week. 2)  Please have fasting labs done prior to your next visit.   Orders Added: 1)   [facility]- Est Level  3 [60454]

## 2010-06-05 ENCOUNTER — Encounter: Payer: Self-pay | Admitting: *Deleted

## 2010-09-04 NOTE — Discharge Summary (Signed)
[facility]  Patient:    Angelica Brown, Angelica Brown                      MRN: [id number] Adm. Date:  [id number] Disc. Date: 07/01/00 Attending:  Antionette Char Dictator:   Ocie Doyne, M.D. CC:         Cheree Ditto, M.D.   Discharge Summary  DATE OF BIRTH:                1975-12-20  DISCHARGE DIAGNOSES:          1. A 17-week twin gestation.                               2. Intrauterine fetal demise of Twin A.                               3. Preterm contractions.  DISCHARGE MEDICATIONS:        1. Prenatal vitamins one p.o. q.d.                               2. Tylenol 650 mg p.o. q.6h. p.r.n. pain.                               3. Albuterol metered dose inhaler p.r.n.                                  wheezing.  ADMISSION HISTORY AND PHYSICAL:                 This 34 year old G3, P2-0-0-2 at 16-3/[redacted] weeks gestation presented to [facility] complaining of 12-hour history of mild abdominal cramping gradually increasing in intensity and vaginal bleeding and spotting. She denied passing any blood clots. She noted that the vaginal discharge was dark and had noted a two-day history of a change in vaginal odor, somewhat fishy smelling but denied any discharge other than the bleeding. She noted that her cramps, to her, felt like labor. Her prior prenatal care has been with Dr. Rodman Key at [facility] and she missed her February visit because she was in [place] for a funeral.  PAST OBSTETRIC HISTORY:       Remarkable for two normal spontaneous vaginal deliveries.  PAST MEDICAL HISTORY:         Otherwise noncontributory.  PHYSICAL EXAMINATION:         Vital signs were stable. She was not orthostatic. Abdomen was soft, nontender, nondistended. There was no rebound tenderness. On vaginal exam, there was a small amount of bright red blood. Cervix was closed, long, and high. There was no cervical motion tenderness, no adnexal tenderness.  ADMISSION  LABORATORIES:       Group B strep negative. GC and Chlamydia probes negative.  Complete OB ultrasound showed intrauterine fetal demise of Twin A at approximate gestational age of [redacted] weeks 4 days. There was no amniotic fluid surrounding the Twin A gestation. Twin B was in breech position with amniotic fluid within normal limits. No significant abnormalities were noted. Cervical length was recorded as 3.9 cm with a closed internal os.  HOSPITAL COURSE:  The patient was admitted to the antenatal unit on complete bed rest. Unasyn IV was started and fetal heart tones were checked by Doppler ultrasound b.i.d. She remained afebrile with stable vital signs. She continued to have intermittent abdominal pain and lower abdomen cramping with mild to moderate spotting and passing small clots approximately 2-3 cm in size. This patient was discussed in detail in rounds and our assessment was that she continued to pass blood and small clots most likely because of an intrauterine infection of Twin A. Following administration of antibiotics, she remained afebrile throughout her hospitalization with only occasional uterine irritability, did not settle into a contraction pattern, and remained stable. After hospital day two she was allowed to ambulate ad lib and was managed expectantly. On the date of discharge, March 15, she was continuing to have mild lower abdominal cramping which was adequately controlled with Tylenol and continued to pass small amounts of bright red blood from the vagina. Her cervical exam was fingertip, long, and high. The remainder of her physical exam was unremarkable and her vital signs were stable. Doppler fetal heart rate for Twin B was 130s-140s. She completed a seven-day course of IV Unasyn and was discharged home in stable condition. She will remain on partial bed rest at home with no heavy lifting, complete pelvic rest, and nothing per vagina until vagina. She has  been given preterm labor precautions and she will follow up in the [facility] next week. She will continue to follow up with Dr. Marina Goodell at [facility] for routine medical care. DD:  07/01/00 TD:  07/01/00 Job: [id number]

## 2010-09-04 NOTE — Op Note (Signed)
[name], Angelica Brown               ACCOUNT NO.:  [id number]   MEDICAL RECORD NO.:  [id number]          PATIENT TYPE:  INP   LOCATION:  9101                          FACILITY:  [facility]   PHYSICIAN:  Angie B. Merlene Morse, MD  DATE OF BIRTH:  Mar 12, 1976   DATE OF PROCEDURE:  09/09/2005  DATE OF DISCHARGE:                                 OPERATIVE REPORT   PREOPERATIVE DIAGNOSIS:  Multiparity, desires permanent sterilization.   POSTOPERATIVE DIAGNOSIS:  Multiparity, desires permanent sterilization.   PROCEDURE:  Postpartum tubal ligation, Filshie clip.   SURGEON:  Dr. Okey Dupre   ASSISTANT:  Dr. Kathlyn Sacramento   ANESTHESIA:  Epidural.   COMPLICATIONS:  None.   ESTIMATED BLOOD LOSS:  Less than 20 mL.   INDICATIONS:  A 35 year old G5, P4, postpartum day #1, status post SVD who  desires permanent sterilization.  The risks and benefits of the procedure  were discussed with the patient including risk of failure 35:1000 with  increased risk of ectopic gestation pregnancy.   FINDINGS:  Normal uterus, tubes, and ovaries.   DESCRIPTION OF PROCEDURE:  The patient was taken to the operating room where  epidural anesthesia was found to be adequate.  A small transverse  infraumbilical skin incision was then made with a scalpel.  The incision was  then carried through the underlying fascia until the peritoneum was  identified and entered.  The peritoneum was noted to be free of any  adhesions.  An incision was extended with the Metzenbaum scissors.  The  patient's right fallopian tube was identified and brought to the incision,  grasped with the Babcock.  Tube was then followed out to the fimbria, and a  Babcock clamp was then used to grasp the tube approximately 4 cm from the  cornual region.  A Filshie clip was placed.  Good hemostasis was noted, and  the tube was returned to the abdomen.  The left fallopian tube was then like  guided with Filshie in a similar fashion.  Excellent hemostasis was  noted,  and that tube was returned to the abdomen.  Peritoneum and fascia were  closed in a single layer.  Skin was closed in a subcuticular  fashion, and 6 mL of Marcaine was given after the procedure locally.  Dermabond was placed in the incision.  The patient tolerated the procedure  well.  Sponge, lap, and needle counts were correct x2.  The patient was  taken to the recovery room in stable condition.           ______________________________  August Saucer Merlene Morse, MD     ABC/MEDQ  D:  09/09/2005  T:  09/09/2005  Job:  [id number]

## 2010-09-04 NOTE — Discharge Summary (Signed)
   NAME:  Angelica Brown, Angelica Brown                         ACCOUNT NO.:  [id number]   MEDICAL RECORD NO.:  [id number]                   PATIENT TYPE:  INP   LOCATION:  9134                                 [facility]ILITY:  [facility]   PHYSICIAN:  Phil D. Okey Dupre, M.D.                  DATE OF BIRTH:  1976/04/08   DATE OF ADMISSION:  01/20/2002  DATE OF DISCHARGE:                                 DISCHARGE SUMMARY   HISTORY OF PRESENT ILLNESS:  The patient is a 35 year old gravida 4, para 2-  1-0-2 31 weeks 4 days gestation who was admitted on October 4 because of  recurrent Bartholin gland abscesses which were draining.  The patient was  seen on the a.m. of the sixth.  It was obvious that she was not draining the  Bartholin abscess which was huge on the right side.  She was taken to the OR  and under spinal anesthesia incision and drainage and marsupialization was  carried out with a pack of iodoform placed into the abscess opening.  During  the procedure it was noted that she also had a left Bartholin gland abscess,  but smaller, but significant in that it dissected up to the top of the labia  on the left side.  This was I&Dd and marsupialized as well and iodoform  packed.  Today, the seventh, on day of discharge the patient is doing well,  although still in some discomfort from the pack being in there.  After seven  previous attempts by different physicians to I&D this as an outpatient, she  is very willing to leave the pack in and to continue sitz baths at home.  The patient is a gestational diabetic.  We are awaiting diabetic teaching  prior to her discharge.  She will then be discharged on Zithromax and  Percocet and referred next week to the [facility] at [facility] to  be followed immediately by appointment for the GYN clinic to evaluate the  abscesses and consider removal of the packs.  She will be on sitz baths  three to four times a day at home.  She understands the seriousness of this  recurrence.   DISCHARGE DIAGNOSES:  1. Recurrent bilateral Bartholin gland abscesses.  2. Gestational diabetes.  3. A 34 and [redacted] week gestation, intrauterine.                                               Phil D. Okey Dupre, M.D.    PDR/MEDQ  D:  01/23/2002  T:  01/23/2002  Job:  [id number]   cc:   [facility]

## 2010-09-04 NOTE — Discharge Summary (Signed)
[facility]  Patient:    Angelica Brown, Angelica Brown                      MRN: [id number] Adm. Date:  [id number] Disc. Date: [id number] Attending:  Michaelle Copas Dictator:   Ocie Doyne, M.D.                           Discharge Summary  DISCHARGE DIAGNOSES:          Twin gestation at 25 weeks with missed abortion of twin A at 12 weeks, twin B is viable.  DISCHARGE MEDICATIONS:        1. Prenatal vitamins one p.o. b.i.d.                               2. Tylenol 325 mg one to two tabs p.o. q.6h.                                  p.r.n. pain.  HISTORY AND PHYSICAL:         This 35 year old G5, P2-0-2-2 at approximately 42 weeks and 3/7 gestational age of a twin pregnancy with missed abortion at 12 weeks and one viable fetus presented to maternity admissions complaining of abdominal cramping 10/10 in severity and increased vaginal bleeding with passage of several clots.  She had previously been seen in maternity admissions twice over the past one week and prior to that had been admitted for one week and treated with IV antibiotics.  She has not passed any recognizable fetal tissue.  PHYSICAL EXAMINATION  VITAL SIGNS:                  Afebrile with stable vital signs.  ABDOMEN:                      Soft, obese, nontender, nondistended with no rebound and no guarding.  Fetal heart rate was 160 baseline by Doppler.  LABORATORIES:                 CBC:  White blood cell count 20.0, hemoglobin 10.5, hematocrit 29.2, platelets 363,000 with an absolute neutrophil count of 15.2.                                OB ultrasound shows AFI within normal limits for [redacted] weeks gestation at 4.3 cm, a fundal posterior grade 1 placenta, a cervical length of 5.1, and baby A was noted to have no fluid and was difficult to image.  Baby B had adequate fluid and growth was concordant with approximately [redacted] weeks gestational age.  HOSPITAL COURSE:              Angelica Brown was  admitted for close monitoring and observation for a possible evolving infection.  Her pain was controlled with IV morphine and oral Percocet and she received IV rehydration.  Approximately 12 hours following admission she was feeling considerably better.  She described her abdominal pain as significantly milder and with decreased bleeding.  She continued to have approximately six uterine contractions an hour.  A repeat CBC showed white blood cells of 16.3, hemoglobin 9.1, and she was started on ferrous sulfate 325 mg b.i.d.  She continued  to do well over the next 24 hours, her primary complaint being difficulty getting comfortable in bed and trouble sleeping.  She did complain of a headache but stated that her cramping was essentially stable and did not have an increase in bleeding. At the time of discharge her white count was 15.8, hemoglobin 9.2.  She remained afebrile with a benign abdominal examination and the patient indicated that she was comfortable with discharge home with close followup. She will remain on bed rest at home.  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  Followup will be at the high risk clinic on July 13, 2000 and she will return to see Dr. Gavin Potters for prenatal care on July 19, 2000 at 1 p.m.  Preterm labor precautions were provided.  CONDITION ON DISCHARGE:       Stable. DD:  07/11/00 TD:  07/12/00 Job: [id number]

## 2010-09-04 NOTE — Discharge Summary (Signed)
[facility]  Patient:    Angelica Brown, Angelica Brown                      MRN: [id number] Adm. Date:  [id number] Disc. Date: 07/01/00 Attending:  Antionette Char Dictator:   Ocie Doyne, M.D. CC:         Cheree Ditto, M.D.   Discharge Summary  DATE OF BIRTH:                07-31-1975  DISCHARGE DIAGNOSES:          1. 17-week twin gestation.                               2. Intrauterine fetal demise of twin A.                               3. Preterm contractions.  DISCHARGE MEDICATIONS:        1. Prenatal vitamins one p.o. day.                               2. Tylenol 650 mg p.o. q.6h. p.r.n. pain.                               3. Albuterol metered dose inhaler p.r.n.                                  wheezing.  ADMISSION HISTORY AND PHYSICAL:                     This 35 year old, G3, P2-0-0-2, at 16-3/[redacted] weeks gestation presented to the [facility]U complaining of a 12-hour history of mild abdominal cramping gradually increasing in intensity and vaginal bleeding and spotting.  She denied passing blood clots, but noted that the vaginal discharge was dark.  She had noted a two-day history of a change in vaginal odor, somewhat fishy smelling, but denied any discharge other than the bleeding.  She noted that her cramps to her felt like labor.  Her prior prenatal care has been with Cheree Ditto, M.D., at [facility]. [facility].  She missed her February visit because she was in [place] for a funeral.  PAST OBSTETRICAL HISTORY:     Remarkable for two normal spontaneous vaginal deliveries.  PAST MEDICAL HISTORY:         Otherwise noncontributory.  PHYSICAL EXAMINATION:         Vital signs were stable.  She was not orthostatic.  ABDOMEN:                      Soft, nontender, nondistended.  There was no rebound tenderness.  PELVIC:                       On vaginal exam, there was a small amount of bright red blood.  The cervix was closed, long, and high.   There was no cervical motion tenderness and no adnexal tenderness.  ADMISSION LABORATORY DATA:    Group B streptococcus negative.  GC and chlamydia probes negative.  The complete OB ultrasound showed intrauterine fetal demise of twin A  at approximate gestational age of [redacted] weeks 4 days. There was no amniotic fluid surrounding the twin A gestation.  Twin B was in breech position.  Amniotic fluid within normal limits.  No significant abnormalities were noted.  Cervical length was recorded as 3.9 cm with a closed internal os.  HOSPITAL COURSE:              The patient was admitted to the antenatal unit on complete bed rest.  Unasyn IV was started and fetal heart tones were checked by Doppler ultrasound b.i.d.  She remained afebrile with stable vital signs.  She continued to have intermittent abdominal pain and lower abdominal cramping with mild to moderate spotting and passing small clots approximately 2-3 cm in size.  The patient was discussed in details in rounds.  Our assessment was that she continued to pass blood and small clots most likely because of an intrauterine infection of twin A.  Following administration of antibiotics, she remained afebrile throughout her hospitalization with only occasional uterine irritability.  She did not settle into a contraction pattern and remained stable.  After hospital day #2, she was allowed to ambulate and was managed expectantly.  On the day of discharge, she as continuing to have mild lower abdominal cramping which was adequately controlled with Tylenol and continued to pass small amounts of bright red blood from the vagina.  Her cervical exam was fingertip, long, and high.  The remainder of her physical exam was unremarkable and her vital signs were stable.  Doppler fetal heart rates for twin B were in the 130s-140s.  She completed a seven-day course of IV Unasyn.  DISPOSITION:                  She was discharged home in stable  condition.  ACTIVITY:                     She will remain on partial bed rest at home with no heavy lifting, complete pelvic rest, and nothing per vagina until delivery. She has been given preterm labor precautions.  FOLLOW-UP:                    She will follow up in the high-risk OB clinic next week.  She will continue to follow up with Cheree Ditto, M.D., at [facility]. [facility] for routine medical care. DD:  07/01/00 TD:  07/01/00 Job: [id number]

## 2010-09-04 NOTE — Op Note (Signed)
NAME:  Angelica Brown, Angelica Brown                         ACCOUNT NO.:  [id number]   MEDICAL RECORD NO.:  [id number]                   PATIENT TYPE:  INP   LOCATION:  9134                                 FACILITY:  [facility]   PHYSICIAN:  Phil D. Okey Dupre, M.D.                  DATE OF BIRTH:  07/28/1975   DATE OF PROCEDURE:  01/22/2002  DATE OF DISCHARGE:                                 OPERATIVE REPORT   PROCEDURE:  Incision and drainage and marsupialization bilateral Bartholin's  abscesses.   PREOPERATIVE DIAGNOSES:  Right Bartholin abscess, recurrent.   POSTOPERATIVE DIAGNOSES:  Bilateral Bartholin abscesses.   OPERATIVE FINDINGS:  On examination there was a large Bartholin abscess on  the right side draining from the top of the labia majoris on that side  through a small pin hole a heavy yellowish-green purulent material.  The  abscess encompassed most of the labia on that side and measured 7 x 5 x 5  cm.  When moved away from the left side of the vagina, a 3 x 3 cm abscess  could be delineated on that side.  We made a linear incision just inside the  hymenal ring in the lower third of the vagina on the right side.   PROCEDURE AS FOLLOWS:  Under satisfactory spinal anesthesia with the patient  in a dorsal supine position, the patient's perineum and vagina were prepped  and draped in the usual sterile manner.  Findings were as aforementioned.  The labia was held on the right side so that the hymenal ring could be  easily visualized and just outside the hymenal ring a linear incision of 2  cm was made vertically.  This was stretched with a hemostat to form a  circular incision of approximately 2 cm in diameter.  Approximately 40 cc of  foul purulent material extruded from the cyst cavity.  Interrupted figure-of-  eight sutures were used around the entire cavity to suture mucosa to assist  lining as forming a circular opening.  Once this was done, the stay sutures  were held long.  Attention went  to the left side in which a smaller incision  in the same area was made approximately 1.5 cm.  The cyst also on this side  extended well up into the labia and figure-of-eight sutures were placed  around __________ .  Both sides were then packed with iodoform gauze.  On  the left, especially, the patient was so friable that care had to be taken  to lay the sutures down very gently or it would tear through the tissues.  At the end there seemed to be minimal bleeding.  The iodoform packs were cut  short so that the weight of water from the sitz baths would hopefully keep  them in place for a while.  Minimal blood loss during the procedure, less  than 100 cc.  The patient tolerated  the procedure well and was transferred  to the recovery room in satisfactory condition.                                               Phil D. Okey Dupre, M.D.    PDR/MEDQ  D:  01/22/2002  T:  01/22/2002  Job:  [id number]

## 2010-09-04 NOTE — Discharge Summary (Signed)
[facility] [place]  Patient:    Angelica Brown, Angelica Brown                      MRN: [id number] Adm. Date:  [id number] Disc. Date: [id number] Attending:  Tammi Sou Dictator:   Ocie Doyne, M.D.                           Discharge Summary  DISCHARGE MEDICATIONS:        1. Ibuprofen 800 mg p.o. q.8h. p.r.n. pain.                               2. Prenatal vitamin one p.o. b.i.d. x 6 weeks.                               3. Ambien 10 mg p.o. q.h.s. p.r.n. sleep.  DISCHARGE DIAGNOSES:          1. Spontaneous vaginal delivery of twin                                  gestation at 19 weeks, nonviable.                               2. Blood loss anemia.  ADMISSION HISTORY AND PHYSICAL:                 This 35 year old G5, P2-0-2-2 at 19-6/[redacted] weeks gestation with intrauterine pregnancy of twins presented to [facility] via EMS complaining of cramping, vaginal bleeding, and passage of fetal tissue at home. Twin A was a known 12-week intrauterine fetal demise. She had been cramping intermittently over the past three weeks with bleeding but reported both had increased the night that she passed the fetal tissue. The patient brought Twin A for inspection and on examination it was found to be complete. Initially, Twin B had a Doppler fetal heart tone of 170. Sterile speculum exam revealed clots in the vagina and bulging membranes. Sterile vaginal exam: cervix dilated to 6 cm, 100% effaced with a bulging bag. The patient continued cramping, contracting approximately every two minutes and went on to deliver Twin B ______ . The cord was clamped and cut. The infant was wrapped in a towel and given to the patient to hold at her request. Twin B appeared approximately 18-20 weeks size, had no spontaneous respirations but some limb movement was present. The placenta delivered intact spontaneously, appeared partially calcified. Cervix, vagina, and perineum were explored; no lacerations  were identified. The uterine fundus was massaged and was firm. Estimated blood loss was less than 100 cc. The attending physician on call was notified of a delivery. The placenta and both fetuses were sent to pathology for evaluation. Twin B was pronounced dead approximately one to two minutes following delivery. There were no respirations, no cardiac activity, and no movement at that time.  HOSPITAL COURSE:              [name] was offered bereavement counseling and a pastoral care visit for support. She and the father of the baby had multiple questions about the reason for pregnancy loss and support was offered to both the patient and father of the baby.  She has had minimal lochia and pain is adequately controlled on p.o. medications, although she complains of some difficulty sleeping at night. On postpartum day zero, she had the opportunity to hold both Twin A and Twin B and was provided with photographs and footprints, in addition to bereavement counseling. 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 She is discharged home in stable condition and will follow up in six weeks for a postpartum check. She will continue on b.i.d. prenatal vitamins for treatment of her blood loss anemia. The patient is encouraged to seek counseling and support. She indicates that she has a strong support system at home and the patient was advised to return immediately if she is having thoughts of self harm or has any concerns about her safety. DD:  07/15/00 TD:  07/17/00 Job: [id number]

## 2011-05-11 ENCOUNTER — Emergency Department (INDEPENDENT_AMBULATORY_CARE_PROVIDER_SITE_OTHER)
Admission: EM | Admit: 2011-05-11 | Discharge: 2011-05-11 | Disposition: A | Discharge status: Home / Self Care | Payer: PRIVATE HEALTH INSURANCE | Source: Home / Self Care | Attending: Emergency Medicine | Admitting: Emergency Medicine

## 2011-05-11 ENCOUNTER — Encounter (HOSPITAL_COMMUNITY): Payer: Self-pay | Admitting: Emergency Medicine

## 2011-05-11 DIAGNOSIS — J029 Acute pharyngitis, unspecified: Secondary | ICD-10-CM

## 2011-05-11 MED ORDER — FEXOFENADINE-PSEUDOEPHED ER 60-120 MG PO TB12: 1.0000 | ORAL_TABLET | Freq: Two times a day (BID) | ORAL | Status: DC

## 2011-05-11 MED ORDER — ACETAMINOPHEN-CODEINE #3 300-30 MG PO TABS: 1.0000 | ORAL_TABLET | Freq: Four times a day (QID) | ORAL | Status: AC | PRN

## 2011-05-11 NOTE — ED Provider Notes (Signed)
History     CSN: [id number]  Arrival date & time 05/11/11  1754   First MD Initiated Contact with Patient 05/11/11 1806      Chief Complaint  Patient presents with  . Sore Throat    (Consider location/radiation/quality/duration/timing/severity/associated sxs/prior treatment) HPI Comments: Sore throat x 1 week, minimal cough , some congestion at first it was green, no clear mucous both eras hurt"  Patient is a 36 y.o. female presenting with pharyngitis. The history is provided by the patient.  Sore Throat This is a new problem. The current episode started more than 1 week ago. The problem occurs constantly. The problem has not changed since onset.Pertinent negatives include no chest pain, no abdominal pain, no headaches and no shortness of breath. The symptoms are aggravated by swallowing.    History reviewed. No pertinent past medical history.  History reviewed. No pertinent past surgical history.  History reviewed. No pertinent family history.  History  Substance Use Topics  . Smoking status: Current Everyday Smoker -- 0.5 packs/day for 10 years    Types: Cigarettes  . Smokeless tobacco: Not on file  . Alcohol Use: No    OB History    Grav Para Term Preterm Abortions TAB SAB Ect Mult Living                  Review of Systems  Constitutional: Negative for fever and diaphoresis.  HENT: Positive for ear pain, congestion and sore throat. Negative for rhinorrhea, trouble swallowing, neck stiffness, voice change, sinus pressure and ear discharge.   Respiratory: Negative for shortness of breath.   Cardiovascular: Negative for chest pain.  Gastrointestinal: Negative for abdominal pain.  Skin: Negative for rash.  Neurological: Negative for headaches.    Allergies  Review of patient's allergies indicates no known allergies.  Home Medications   Current Outpatient Rx  Name Route Sig Dispense Refill  . MELOXICAM 15 MG PO TABS Oral Take 15 mg by mouth daily. For pain       . TERBINAFINE HCL 250 MG PO TABS Oral Take 250 mg by mouth daily. For 12 weeks       BP 109/56  Pulse 86  Temp(Src) 99.1 F (37.3 C) (Oral)  Resp 20  SpO2 97%  LMP 04/13/2011  Physical Exam  Nursing note and vitals reviewed. Constitutional: She appears well-developed and well-nourished. No distress.  HENT:  Head: Normocephalic.  Right Ear: Tympanic membrane normal.  Left Ear: Tympanic membrane normal.  Mouth/Throat: Posterior oropharyngeal erythema present. No oropharyngeal exudate.  Neck: Neck supple. No thyromegaly present.  Cardiovascular: Normal rate.   Pulmonary/Chest: Effort normal and breath sounds normal. No respiratory distress.  Skin: Skin is warm. No erythema.    ED Course  Procedures (including critical care time)   Labs Reviewed  POCT RAPID STREP A ([facility] URG CARE ONLY)   No results found.   No diagnosis found.    MDM: PHARYNGITIS WITH OTALGIA- NEGATIVE STREP TEST-        Jimmie Molly, MD 05/11/11 [phone]

## 2011-05-11 NOTE — ED Notes (Signed)
Pt. Stated, I've had a sore throat for a week with a little cough

## 2011-05-28 ENCOUNTER — Emergency Department (INDEPENDENT_AMBULATORY_CARE_PROVIDER_SITE_OTHER)
Admission: EM | Admit: 2011-05-28 | Discharge: 2011-05-28 | Disposition: A | Discharge status: Home / Self Care | Payer: PRIVATE HEALTH INSURANCE | Source: Home / Self Care | Attending: Family Medicine | Admitting: Family Medicine

## 2011-05-28 ENCOUNTER — Encounter (HOSPITAL_COMMUNITY): Payer: Self-pay | Admitting: *Deleted

## 2011-05-28 DIAGNOSIS — J020 Streptococcal pharyngitis: Secondary | ICD-10-CM

## 2011-05-28 DIAGNOSIS — D573 Sickle-cell trait: Secondary | ICD-10-CM | POA: Insufficient documentation

## 2011-05-28 HISTORY — DX: Essential (primary) hypertension: I10

## 2011-05-28 HISTORY — DX: Sickle-cell trait: D57.3

## 2011-05-28 MED ORDER — PENICILLIN V POTASSIUM 500 MG PO TABS: 500.0000 mg | ORAL_TABLET | Freq: Three times a day (TID) | ORAL | Status: AC

## 2011-05-28 MED ORDER — IBUPROFEN 600 MG PO TABS: 600.0000 mg | ORAL_TABLET | Freq: Three times a day (TID) | ORAL | Status: AC | PRN

## 2011-05-28 NOTE — ED Notes (Signed)
Seen her 2 weeks for same.  Says getting worse - needs antibiotic. Cough- slightly productive. No fever. Has finished med ( except codeine) .

## 2011-05-28 NOTE — ED Provider Notes (Signed)
History     CSN: [id number]  Arrival date & time 05/28/11  4098   First MD Initiated Contact with Patient 05/28/11 2010      Chief Complaint  Patient presents with  . Sore Throat    (Consider location/radiation/quality/duration/timing/severity/associated sxs/prior treatment) HPI Comments: 36 y/o smoker  female h/o HTH here c/o sore throat for 2 weeks was seen here about 1 week ago diagnosed with a URI has a negative rapid strept test. Cold resolved but persistent sore throat, pain with swallowing and bilateral ear pain. No fever,  Sporadic cough with clear sputum, no chest pain no wheezing. No abdominal pain, nausea or vomiting. no rash.    Past Medical History  Diagnosis Date  . Sickle cell trait   . Hypertension     Past Surgical History  Procedure Date  . Tubal ligation 2007  . Wisdom tooth extraction 1998  . Bartholin gland cyst excision 2004    No family history on file.  History  Substance Use Topics  . Smoking status: Current Everyday Smoker -- 10 years    Types: Cigarettes  . Smokeless tobacco: Never Used  . Alcohol Use: Yes    OB History    Grav Para Term Preterm Abortions TAB SAB Ect Mult Living   5 5 4 1     1 4       Review of Systems  Constitutional: Negative for fever, chills and appetite change.  HENT: Positive for ear pain, sore throat and trouble swallowing. Negative for neck pain, voice change, sinus pressure and ear discharge.   Eyes: Negative for discharge.  Respiratory: Positive for cough. Negative for shortness of breath and wheezing.   Cardiovascular: Negative for chest pain and leg swelling.  Gastrointestinal: Negative for nausea, vomiting and abdominal pain.  Skin: Negative for rash.  Neurological: Negative for headaches.    Allergies  Review of patient's allergies indicates no known allergies.  Home Medications   Current Outpatient Rx  Name Route Sig Dispense Refill  . FEXOFENADINE-PSEUDOEPHED ER 60-120 MG PO TB12 Oral Take 1  tablet by mouth every 12 (twelve) hours. 30 tablet 0  . IBUPROFEN 600 MG PO TABS Oral Take 1 tablet (600 mg total) by mouth every 8 (eight) hours as needed for pain or fever. 20 tablet 0  . MELOXICAM 15 MG PO TABS Oral Take 15 mg by mouth daily. For pain     . PENICILLIN V POTASSIUM 500 MG PO TABS Oral Take 1 tablet (500 mg total) by mouth 3 (three) times daily. 30 tablet 0  . TERBINAFINE HCL 250 MG PO TABS Oral Take 250 mg by mouth daily. For 12 weeks       BP 126/71  Pulse 71  Temp(Src) 98 F (36.7 C) (Oral)  Resp 20  SpO2 99%  LMP 05/15/2011  Physical Exam  Nursing note and vitals reviewed. Constitutional: She is oriented to person, place, and time. She appears well-developed and well-nourished. No distress.  HENT:  Head: Normocephalic and atraumatic.       Nasal Congestion with erythema and swelling of nasal turbinates, clear rhinorrhea. Significant pharyngeal erythema no exudates. No uvula deviation. No trismus. TM's with increased vascular markings and some dullness bilaterally no swelling or bulging   Eyes: Conjunctivae and EOM are normal. Pupils are equal, round, and reactive to light. Right eye exhibits no discharge. Left eye exhibits no discharge.  Neck: Neck supple. No JVD present.  Cardiovascular: Normal rate, regular rhythm and normal heart sounds.  Pulmonary/Chest: Effort normal and breath sounds normal. No respiratory distress. She has no wheezes. She has no rales. She exhibits no tenderness.  Lymphadenopathy:    She has no cervical adenopathy.  Neurological: She is alert and oriented to person, place, and time.  Skin: No rash noted.    ED Course  Procedures (including critical care time)  Labs Reviewed  POCT RAPID STREP A ([facility] CARE ONLY) - Abnormal; Notable for the following:    Streptococcus, Group A Screen (Direct) POSITIVE (*)    All other components within normal limits  LAB REPORT - SCANNED   No results found.   1. Strep throat       MDM    Positive strept test. Treated with penicillin V, plus symptomatic treatment.         Sharin Grave, MD 05/29/11 1346

## 2011-07-27 ENCOUNTER — Encounter (HOSPITAL_COMMUNITY): Payer: Self-pay | Admitting: Emergency Medicine

## 2011-07-27 ENCOUNTER — Emergency Department (HOSPITAL_COMMUNITY): Payer: No Typology Code available for payment source

## 2011-07-27 ENCOUNTER — Emergency Department (HOSPITAL_COMMUNITY)
Admission: EM | Admit: 2011-07-27 | Discharge: 2011-07-27 | Disposition: A | Discharge status: Home / Self Care | Payer: No Typology Code available for payment source | Attending: Emergency Medicine | Admitting: Emergency Medicine

## 2011-07-27 DIAGNOSIS — R109 Unspecified abdominal pain: Secondary | ICD-10-CM | POA: Insufficient documentation

## 2011-07-27 DIAGNOSIS — R51 Headache: Secondary | ICD-10-CM | POA: Insufficient documentation

## 2011-07-27 DIAGNOSIS — M549 Dorsalgia, unspecified: Secondary | ICD-10-CM | POA: Insufficient documentation

## 2011-07-27 DIAGNOSIS — Y92410 Unspecified street and highway as the place of occurrence of the external cause: Secondary | ICD-10-CM | POA: Insufficient documentation

## 2011-07-27 MED ORDER — HYDROCODONE-ACETAMINOPHEN 5-325 MG PO TABS: 2.0000 | ORAL_TABLET | ORAL | Status: AC | PRN

## 2011-07-27 MED ORDER — DIAZEPAM 5 MG PO TABS: 5.0000 mg | ORAL_TABLET | Freq: Three times a day (TID) | ORAL | Status: AC | PRN

## 2011-07-27 MED ORDER — IBUPROFEN 800 MG PO TABS: 800.0000 mg | ORAL_TABLET | Freq: Three times a day (TID) | ORAL | Status: AC
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
## 2011-07-27 NOTE — ED Provider Notes (Signed)
Medical screening examination/treatment/procedure(s) were performed by non-physician practitioner and as supervising physician I was immediately available for consultation/collaboration.  Cayden Rautio, MD 07/27/11 2317 

## 2011-09-24 ENCOUNTER — Encounter: Payer: No Typology Code available for payment source | Admitting: Family Medicine

## 2011-10-15 ENCOUNTER — Encounter: Payer: No Typology Code available for payment source | Admitting: Family Medicine

## 2011-10-19 ENCOUNTER — Encounter: Payer: No Typology Code available for payment source | Admitting: Family Medicine

## 2011-11-30 ENCOUNTER — Encounter: Payer: No Typology Code available for payment source | Admitting: Family Medicine

## 2011-12-02 DIAGNOSIS — F17200 Nicotine dependence, unspecified, uncomplicated: Secondary | ICD-10-CM | POA: Insufficient documentation

## 2011-12-02 DIAGNOSIS — R404 Transient alteration of awareness: Secondary | ICD-10-CM | POA: Insufficient documentation

## 2011-12-02 DIAGNOSIS — I10 Essential (primary) hypertension: Secondary | ICD-10-CM | POA: Insufficient documentation

## 2011-12-02 DIAGNOSIS — D573 Sickle-cell trait: Secondary | ICD-10-CM | POA: Insufficient documentation

## 2011-12-02 LAB — COMPREHENSIVE METABOLIC PANEL
ALT: 17 U/L (ref 0–35)
AST: 16 U/L (ref 0–37)
Calcium: 9.3 mg/dL (ref 8.4–10.5)
Sodium: 143 mEq/L (ref 135–145)
Total Protein: 7.5 g/dL (ref 6.0–8.3)

## 2011-12-02 LAB — CBC WITH DIFFERENTIAL/PLATELET
Basophils Absolute: 0.1 10*3/uL (ref 0.0–0.1)
Eosinophils Absolute: 0.2 10*3/uL (ref 0.0–0.7)
Eosinophils Relative: 2 % (ref 0–5)
MCH: 29.1 pg (ref 26.0–34.0)
MCV: 81 fL (ref 78.0–100.0)
Platelets: 354 10*3/uL (ref 150–400)
RDW: 14.2 % (ref 11.5–15.5)
WBC: 13.4 10*3/uL — ABNORMAL HIGH (ref 4.0–10.5)
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
## 2011-12-03 ENCOUNTER — Emergency Department (HOSPITAL_COMMUNITY)
Admission: EM | Admit: 2011-12-03 | Discharge: 2011-12-03 | Disposition: A | Discharge status: Home / Self Care | Payer: Medicaid Other | Attending: Emergency Medicine | Admitting: Emergency Medicine

## 2011-12-03 DIAGNOSIS — R55 Syncope and collapse: Secondary | ICD-10-CM

## 2011-12-03 LAB — URINALYSIS, ROUTINE W REFLEX MICROSCOPIC
Bilirubin Urine: NEGATIVE
Hgb urine dipstick: NEGATIVE
Nitrite: NEGATIVE
Specific Gravity, Urine: 1.028 (ref 1.005–1.030)
pH: 6 (ref 5.0–8.0)

## 2011-12-03 LAB — URINE MICROSCOPIC-ADD ON

## 2011-12-03 NOTE — ED Notes (Signed)
Discharge instructions reviewed.  Pt verbalized understanding.  Pt condition stable, no respiratory compromise noted.  Skin warm and dry.  Alert and oriented x 4.  No signs of distress. Family present at discharge.
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
## 2011-12-06 ENCOUNTER — Ambulatory Visit (INDEPENDENT_AMBULATORY_CARE_PROVIDER_SITE_OTHER): Payer: Self-pay | Admitting: Cardiovascular Disease

## 2011-12-06 ENCOUNTER — Encounter: Payer: Self-pay | Admitting: *Deleted

## 2011-12-06 VITALS — BP 112/72 | HR 68 | Ht 69.0 in | Wt 329.8 lb

## 2011-12-06 DIAGNOSIS — R55 Syncope and collapse: Secondary | ICD-10-CM

## 2011-12-06 DIAGNOSIS — E669 Obesity, unspecified: Secondary | ICD-10-CM

## 2011-12-06 DIAGNOSIS — R079 Chest pain, unspecified: Secondary | ICD-10-CM

## 2011-12-06 NOTE — Assessment & Plan Note (Signed)
In setting of nonspecific ECG changes and syncope will have her come back for stress myovue Document any abnormal response to exercise and R/O CAD
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
## 2011-12-06 NOTE — Assessment & Plan Note (Signed)
Discussed low carb diet and exercise  

## 2011-12-06 NOTE — Assessment & Plan Note (Signed)
Vagal sounding episodes  Previous negative w/u including holter.  F/U echo and event monitor.

## 2011-12-06 NOTE — Patient Instructions (Signed)
Your physician recommends that you schedule a follow-up appointment in:  AS NEEDED Your physician has requested that you have an echocardiogram. Echocardiography is a painless test that uses sound waves to create images of your heart. It provides your doctor with information about the size and shape of your heart and how well your heart's chambers and valves are working. This procedure takes approximately one hour. There are no restrictions for this procedure. DX SYNCOPE Your physician has requested that you have en exercise stress myoview. For further information please visit [email or web address] Please follow instruction sheet, as given. DX CHEST PAIN Your physician has recommended that you wear an event monitor. Event monitors are medical devices that record the heart's electrical activity. Doctors most often [place] these monitors to diagnose arrhythmias. Arrhythmias are problems with the speed or rhythm of the heartbeat. The monitor is a small, portable device. You can wear one while you do your normal daily activities. This is usually used to diagnose what is causing palpitations/syncope (passing out). DX SYNCOPE

## 2011-12-08 ENCOUNTER — Ambulatory Visit (HOSPITAL_COMMUNITY): Payer: Medicaid Other | Attending: Cardiology | Admitting: Radiology

## 2011-12-08 ENCOUNTER — Encounter (INDEPENDENT_AMBULATORY_CARE_PROVIDER_SITE_OTHER): Payer: Medicaid Other

## 2011-12-08 VITALS — BP 101/60 | Ht 69.0 in | Wt 330.0 lb

## 2011-12-08 DIAGNOSIS — R55 Syncope and collapse: Secondary | ICD-10-CM | POA: Insufficient documentation

## 2011-12-08 DIAGNOSIS — R079 Chest pain, unspecified: Secondary | ICD-10-CM

## 2011-12-08 DIAGNOSIS — F17200 Nicotine dependence, unspecified, uncomplicated: Secondary | ICD-10-CM | POA: Insufficient documentation

## 2011-12-08 DIAGNOSIS — R002 Palpitations: Secondary | ICD-10-CM | POA: Insufficient documentation

## 2011-12-08 DIAGNOSIS — R5383 Other fatigue: Secondary | ICD-10-CM | POA: Insufficient documentation

## 2011-12-08 DIAGNOSIS — E669 Obesity, unspecified: Secondary | ICD-10-CM | POA: Insufficient documentation

## 2011-12-08 DIAGNOSIS — R0789 Other chest pain: Secondary | ICD-10-CM | POA: Insufficient documentation

## 2011-12-08 DIAGNOSIS — R0602 Shortness of breath: Secondary | ICD-10-CM

## 2011-12-08 DIAGNOSIS — R5381 Other malaise: Secondary | ICD-10-CM | POA: Insufficient documentation

## 2011-12-08 MED ORDER — TECHNETIUM TC 99M TETROFOSMIN IV KIT
30.0000 | PACK | Freq: Once | INTRAVENOUS | Status: AC | PRN
  Administered 2011-12-08: 30 via INTRAVENOUS

## 2011-12-08 NOTE — Progress Notes (Signed)
[facility] SITE 3 NUCLEAR MED [address] [place] [address] [phone]  Cardiology Nuclear Med Study  Angelica Brown is a 36 y.o. female     MRN : [id number]     DOB: [birth year]  Procedure Date: 12/08/2011  Nuclear Med Background Indication for Stress Test:  Evaluation for Ischemia, and [facility] ED on 12-03-11:Syncope. Follow -Up office visit with Dr. Charlton Haws on 12-06-11 History:  No prior known history of CAD Cardiac Risk Factors: Lipids, Obesity and Smoker  Symptoms: Chest Pressure and Chest Tightness with/without exertion (last occurrence 2 months ago),  Dizziness, DOE, Fatigue with Exertion, Light-Headedness, Nausea, Near Syncope, Palpitations, Rapid HR, SOB and Syncope   Nuclear Pre-Procedure Caffeine/Decaff Intake:  None > 12 hrs NPO After: 11:30pm   Lungs:  clear O2 Sat: 97% on room air. IV 0.9% NS with Angio Cath:  20g  IV Site: L Antecubital x 1, tolerated well IV Started by:  Irean Hong, RN  Chest Size (in):  42 Cup Size: DD  Height: 5\' 9"  (1.753 m)  Weight:  330 lb (149.687 kg)  BMI:  Body mass index is 48.73 kg/(m^2). Tech Comments:  Baseline BP 87/58, HR 61 sitting, and BP 101/60 , HR 84 standing     Nuclear Med Study 1 or 2 day study: 2 day  Stress Test Type:  Stress  Reading UY:QIHKV Crenshaw,M.D. Order Authorizing Provider:  Charlton Haws, MD  Resting Radionuclide: Technetium 17m Tetrofosmin  Resting Radionuclide Dose: 33.0 mCi   On    12-09-11  Stress Radionuclide:  Technetium 50m Tetrofosmin  Stress Radionuclide Dose: 33.0 mCi   On       12-08-11          Stress Protocol Rest HR: 61 Stress HR: 162  Rest BP: 101/60 Stress BP: 164/52  Exercise Time (min): 7:16 METS: 8.5   Predicted Max HR: 186 bpm % Max HR: 87.1 bpm Rate Pressure Product: 42595   Dose of Adenosine (mg):  n/a Dose of Lexiscan: n/a mg  Dose of Atropine (mg): n/a Dose of Dobutamine: n/a mcg/kg/min (at max HR)  Stress Test Technologist: Irean Hong, RN   Nuclear Technologist:  Domenic Polite, CNMT     Rest Procedure:  Myocardial perfusion imaging was performed at rest 45 minutes following the intravenous administration of Technetium 14m Tetrofosmin. Rest ECG: NSR  With T wave inversion with standing (inferior, anterior,Lateral) Stress Procedure:  The patient performed treadmill exercise using a Bruce  Protocol for 7 minutes and 16 seconds, RPE-17 minutes. The patient stopped due to DOE, Fatigue, and denied any chest pain.  There were nonspecific ST-T wave changes.  There were occasional PVC's, Bigeminy and Trigeminy.Technetium 84m Tetrofosmin was injected at peak exercise and myocardial perfusion imaging was performed after a brief delay. Stress ECG: No significant ST segment change suggestive of ischemia.  QPS Raw Data Images:  Acquisition technically good; LVE. Stress Images:  There is decreased uptake in the anterior wall. Rest Images:  There is decreased uptake in the anterior wall. Subtraction (SDS):  No evidence of ischemia. Transient Ischemic Dilatation (Normal <1.22):  0.87 Lung/Heart Ratio (Normal <0.45):  0.40  Quantitative Gated Spect Images QGS EDV:  149 ml QGS ESV:  78 ml  Impression Exercise Capacity:  Fair exercise capacity. BP Response:  Normal blood pressure response. Clinical Symptoms:  There is dyspnea. ECG Impression:  No significant ST segment change suggestive of ischemia. Comparison with Prior Nuclear Study: No previous nuclear study performed  Overall Impression:  Normal stress nuclear study with a small, mild, fixed anterior defect suggestive of soft tissue attenuation; no ischemia. Cannot R/O left breast nodule; suggest clinical exam and mammogram.  LV Ejection Fraction: 48%.  LV Wall Motion:  LV function appears better than calculated EF; suggest echocardiogram to better assess.   Angelica Brown

## 2011-12-09 ENCOUNTER — Ambulatory Visit (HOSPITAL_COMMUNITY): Payer: Medicaid Other | Attending: Cardiology | Admitting: Radiology

## 2011-12-09 DIAGNOSIS — F17200 Nicotine dependence, unspecified, uncomplicated: Secondary | ICD-10-CM | POA: Insufficient documentation

## 2011-12-09 DIAGNOSIS — I079 Rheumatic tricuspid valve disease, unspecified: Secondary | ICD-10-CM | POA: Insufficient documentation

## 2011-12-09 DIAGNOSIS — R55 Syncope and collapse: Secondary | ICD-10-CM | POA: Insufficient documentation

## 2011-12-09 DIAGNOSIS — R0989 Other specified symptoms and signs involving the circulatory and respiratory systems: Secondary | ICD-10-CM

## 2011-12-09 DIAGNOSIS — I059 Rheumatic mitral valve disease, unspecified: Secondary | ICD-10-CM | POA: Insufficient documentation

## 2011-12-09 MED ORDER — TECHNETIUM TC 99M TETROFOSMIN IV KIT
30.0000 | PACK | Freq: Once | INTRAVENOUS | Status: AC | PRN
  Administered 2011-12-09: 30 via INTRAVENOUS

## 2011-12-09 NOTE — Progress Notes (Signed)
Echocardiogram performed.  

## 2011-12-27 ENCOUNTER — Encounter: Payer: Self-pay | Admitting: *Deleted

## 2012-01-06 ENCOUNTER — Telehealth: Payer: Self-pay | Admitting: *Deleted

## 2012-01-06 NOTE — Telephone Encounter (Signed)
LMTCB RE MONITOR RESULTS PER DR NISHAN  NSR  NO ARRYTHMIAS./CY

## 2012-01-12 ENCOUNTER — Encounter: Payer: Self-pay | Admitting: Family Medicine

## 2012-01-13 ENCOUNTER — Encounter: Payer: Self-pay | Admitting: *Deleted

## 2012-01-13 NOTE — Telephone Encounter (Signed)
RESULT NOTE  MAILED TO PT .Angelica Brown

## 2013-01-17 ENCOUNTER — Ambulatory Visit (INDEPENDENT_AMBULATORY_CARE_PROVIDER_SITE_OTHER): Payer: Medicaid Other | Admitting: Family Medicine

## 2013-01-17 ENCOUNTER — Other Ambulatory Visit (HOSPITAL_COMMUNITY)
Admission: RE | Admit: 2013-01-17 | Discharge: 2013-01-17 | Disposition: A | Discharge status: Home / Self Care | Payer: Medicaid Other | Source: Ambulatory Visit | Attending: Family Medicine | Admitting: Family Medicine

## 2013-01-17 ENCOUNTER — Telehealth: Payer: Self-pay | Admitting: Genetic Counselor

## 2013-01-17 ENCOUNTER — Encounter: Payer: Self-pay | Admitting: Family Medicine

## 2013-01-17 VITALS — BP 109/76 | HR 56 | Temp 97.9°F | Ht 69.0 in | Wt 323.0 lb

## 2013-01-17 DIAGNOSIS — Z131 Encounter for screening for diabetes mellitus: Secondary | ICD-10-CM

## 2013-01-17 DIAGNOSIS — Z0000 Encounter for general adult medical examination without abnormal findings: Secondary | ICD-10-CM

## 2013-01-17 DIAGNOSIS — Z803 Family history of malignant neoplasm of breast: Secondary | ICD-10-CM

## 2013-01-17 DIAGNOSIS — Z113 Encounter for screening for infections with a predominantly sexual mode of transmission: Secondary | ICD-10-CM | POA: Insufficient documentation

## 2013-01-17 DIAGNOSIS — Z1151 Encounter for screening for human papillomavirus (HPV): Secondary | ICD-10-CM | POA: Insufficient documentation

## 2013-01-17 DIAGNOSIS — Z01419 Encounter for gynecological examination (general) (routine) without abnormal findings: Secondary | ICD-10-CM | POA: Insufficient documentation

## 2013-01-17 DIAGNOSIS — Z13220 Encounter for screening for lipoid disorders: Secondary | ICD-10-CM

## 2013-01-17 DIAGNOSIS — Z124 Encounter for screening for malignant neoplasm of cervix: Secondary | ICD-10-CM

## 2013-01-17 DIAGNOSIS — F17200 Nicotine dependence, unspecified, uncomplicated: Secondary | ICD-10-CM

## 2013-01-17 LAB — LIPID PANEL
Cholesterol: 176 mg/dL (ref 0–200)
LDL Cholesterol: 112 mg/dL — ABNORMAL HIGH (ref 0–99)
VLDL: 15 mg/dL (ref 0–40)

## 2013-01-17 LAB — POCT WET PREP (WET MOUNT): Clue Cells Wet Prep Whiff POC: NEGATIVE

## 2013-01-17 NOTE — Telephone Encounter (Signed)
lvom for pt to return call in re to referral.  °

## 2013-01-17 NOTE — Assessment & Plan Note (Signed)
Patient has a strong family history of breast cancer. She is currently 3 years younger than her grandmother was at the time she was diagnosed. Neither myself nor the patient are sure if any genetic testing has been done on her family members. Clinical breast exam today is non-concerning. After discussion with Dr. Mauricio Po and review of the available literature, will refer patient to genetics for genetic counseling and determination of how to proceed with her breast cancer screening. She may require a mammogram, but I would like her to meet with the genetic counselor first in the event there is additional testing that needs to be done. Patient is agreeable to this plan.
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
## 2013-01-17 NOTE — Assessment & Plan Note (Signed)
Discussed importance of regular exercise, with heart rate increasing during exercise. Patient also desires to go to nutritionist for dietary counseling. Given business card for Dr. Gerilyn Pilgrim.

## 2013-01-17 NOTE — Assessment & Plan Note (Addendum)
Encouraged patient's desire to quit smoking. Given card for 1800quitnow line.

## 2013-01-17 NOTE — Patient Instructions (Addendum)
It was nice to meet you today!  I will call you if your test results are not normal.  Otherwise, I will send you a letter.  If you do not hear from me with in 2 weeks please call our office.     Come back to talk about the dizziness and toenails at a separate visit.  I am referring you to genetics for your history of breast cancer. You will get a phone call to schedule this appointment.  See the card regarding the 1800 quit now line to stop smoking. Also see Dr. Gerilyn Pilgrim card regarding nutrition.  Be well, Dr. Pollie Meyer

## 2013-01-18 LAB — T.PALLIDUM AB, TOTAL: T pallidum Antibodies (TP-PA): 0.17 S/CO (ref <0.90)

## 2013-01-25 ENCOUNTER — Encounter: Payer: Self-pay | Admitting: Family Medicine

## 2013-02-06 ENCOUNTER — Telehealth: Payer: Self-pay | Admitting: *Deleted

## 2013-02-06 NOTE — Telephone Encounter (Signed)
Received referral in workque.  Called and left a message for pt to return my call so I can schedule her for a genetic appt.

## 2013-02-14 ENCOUNTER — Ambulatory Visit: Payer: Medicaid Other | Admitting: Family Medicine

## 2013-02-14 ENCOUNTER — Telehealth: Payer: Self-pay | Admitting: *Deleted

## 2013-02-14 NOTE — Telephone Encounter (Signed)
Confirmed 04/05/13 genetic appt w/ pt.

## 2013-04-05 ENCOUNTER — Encounter: Payer: Medicaid Other | Admitting: Genetic Counselor

## 2013-04-05 ENCOUNTER — Other Ambulatory Visit: Payer: Medicaid Other

## 2013-04-09 ENCOUNTER — Telehealth: Payer: Self-pay | Admitting: *Deleted

## 2013-04-09 NOTE — Telephone Encounter (Signed)
Received a referral in the workque to get pt rescheduled for genetics because she missed her appt.  Left her a message to call me back.

## 2013-04-16 ENCOUNTER — Telehealth: Payer: Self-pay | Admitting: *Deleted

## 2013-04-16 ENCOUNTER — Ambulatory Visit (INDEPENDENT_AMBULATORY_CARE_PROVIDER_SITE_OTHER): Payer: Medicaid Other | Admitting: Family Medicine

## 2013-04-16 ENCOUNTER — Encounter: Payer: Self-pay | Admitting: Family Medicine

## 2013-04-16 VITALS — BP 106/74 | HR 55 | Temp 98.1°F | Ht 69.0 in | Wt 334.0 lb

## 2013-04-16 DIAGNOSIS — B351 Tinea unguium: Secondary | ICD-10-CM

## 2013-04-16 LAB — COMPREHENSIVE METABOLIC PANEL
ALT: 27 U/L (ref 0–35)
AST: 20 U/L (ref 0–37)
Alkaline Phosphatase: 59 U/L (ref 39–117)
BUN: 11 mg/dL (ref 6–23)
Creat: 0.73 mg/dL (ref 0.50–1.10)
Sodium: 140 mEq/L (ref 135–145)
Total Bilirubin: 0.4 mg/dL (ref 0.3–1.2)
Total Protein: 6.6 g/dL (ref 6.0–8.3)

## 2013-04-16 MED ORDER — TERBINAFINE HCL 250 MG PO TABS: 250.0000 mg | ORAL_TABLET | Freq: Every day | ORAL | Status: DC

## 2013-04-16 NOTE — Progress Notes (Signed)
Patient ID: Angelica Brown, female   DOB: 09-26-1975, 37 y.o.   MRN: [id number]  HPI:  Toenails: has had fungal infection of her toenails for 6-7 years. Several years ago she took terbinafine twice daily for about 30 days. It did not help. Her toenails get very sore. She has tried multiple over the counter topical fungal medicines but these haven't worked. Denies fevers. Sometimes her toes get red when they are irritated. She soaks them in warm water.   ROS: See HPI  PMFSH: hx onychomycosis  PHYSICAL EXAM: BP 106/74  Pulse 55  Temp(Src) 98.1 F (36.7 C) (Oral)  Ht 5\' 9"  (1.753 m)  Wt 334 lb (151.501 kg)  BMI 49.30 kg/m2  LMP 04/09/2013 Gen: NAD, pleasant, cooperative HEENT: NCAT Lungs: NWOB Neuro: grossly nonfocal, speech intact Ext: 2+DP pulses bilaterally. Mild tinea pedis present in between fourth and fifth toes bilaterally. No other skin breakdown or sores. All toenails on both feet show signs of onychomycosis with thickening and pointing of the nails.  ASSESSMENT/PLAN:  See problem based charting for assessment/plan.   FOLLOW UP: F/u in 6 weeks for repeat CMET. F/u in 6 months for recheck of toenails.  [name], MD [facility]

## 2013-04-16 NOTE — Patient Instructions (Addendum)
It was great to see you again today!  I am prescribing terbinafine for your toenails.  Return in 6 weeks to have your labs redrawn (you can schedule this on the way out today).  Return for an office visit in 6 months to recheck the toenails.  Be well, Dr. Pollie Meyer   Terbinafine tablets What is this medicine? TERBINAFINE (TER bin a feen) is an antifungal medicine. It is used to treat certain kinds of fungal or yeast infections. This medicine may be used for other purposes; ask your health care provider or pharmacist if you have questions. COMMON BRAND NAME(S): Lamisil, Terbinex What should I tell my health care provider before I take this medicine? They need to know if you have any of these conditions: -drink alcoholic beverages -kidney disease -liver disease -an unusual or allergic reaction to terbinafine, other medicines, foods, dyes, or preservatives -pregnant or trying to get pregnant -breast-feeding How should I use this medicine? Take this medicine by mouth with a full glass of water. Follow the directions on the prescription label. You can take this medicine with food or on an empty stomach. Take your medicine at regular intervals. Do not take your medicine more often than directed. Do not skip doses or stop your medicine early even if you feel better. Do not stop taking except on your doctor's advice. Talk to your pediatrician regarding the use of this medicine in children. Special care may be needed. Overdosage: If you think you have taken too much of this medicine contact a poison control center or emergency room at once. NOTE: This medicine is only for you. Do not share this medicine with others. What if I miss a dose? If you miss a dose, take it as soon as you can. If it is almost time for your next dose, take only that dose. Do not take double or extra doses. What may interact with this medicine? Do not take this medicine with any of the following  medications: -thioridazine This medicine may also interact with the following medications: -beta-blockers -caffeine -cimetidine -cyclosporine -medicines for depression, anxiety, or psychotic disturbances -medicines for fungal infections like fluconazole and ketoconazole -medicines for irregular heartbeat like amiodarone, flecainide and propafenone -rifampin -warfarin This list may not describe all possible interactions. Give your health care provider a list of all the medicines, herbs, non-prescription drugs, or dietary supplements you use. Also tell them if you smoke, drink alcohol, or use illegal drugs. Some items may interact with your medicine. What should I watch for while using this medicine? Visit your doctor or health care provider regularly. Tell your doctor right away if you have nausea or vomiting, loss of appetite, stomach pain on your right upper side, yellow skin, dark urine, light stools, or are over tired. Some fungal infections need many weeks or months of treatment to cure. If you are taking this medicine for a long time, you will need to have important blood work done. What side effects may I notice from receiving this medicine? Side effects that you should report to your doctor or health care professional as soon as possible: -allergic reactions like skin rash or hives, swelling of the face, lips, or tongue -changes in vision -dark urine -fever or infection -general ill feeling or flu-like symptoms -light-colored stools -loss of appetite, nausea -redness, blistering, peeling or loosening of the skin, including inside the mouth -right upper belly pain -unusually weak or tired -yellowing of the eyes or skin Side effects that usually do not require medical  attention (report to your doctor or health care professional if they continue or are bothersome): -changes in taste -diarrhea -hair loss -muscle or joint pain -stomach gas -stomach upset This list may not  describe all possible side effects. Call your doctor for medical advice about side effects. You may report side effects to FDA at 1-800-FDA-1088. Where should I keep my medicine? Keep out of the reach of children. Store at room temperature below 25 degrees C (77 degrees F). Protect from light. Throw away any unused medicine after the expiration date. NOTE: This sheet is a summary. It may not cover all possible information. If you have questions about this medicine, talk to your doctor, pharmacist, or health care provider.  2014, Elsevier/Gold Standard. (2007-06-16 16:28:07)    Onychomycosis/Fungal Toenails  WHAT IS IT? An infection that lies within the keratin of your nail plate that is caused by a fungus.  WHY ME? Fungal infections affect all ages, sexes, races, and creeds.  There may be many factors that predispose you to a fungal infection such as age, coexisting medical conditions such as diabetes, or an autoimmune disease; stress, medications, fatigue, genetics, etc.  Bottom line: fungus thrives in a warm, moist environment and your shoes offer such a location.  IS IT CONTAGIOUS? Theoretically, yes.  You do not want to share shoes, nail clippers or files with someone who has fungal toenails.  Walking around barefoot in the same room or sleeping in the same bed is unlikely to transfer the organism.  It is important to realize, however, that fungus can spread easily from one nail to the next on the same foot.  HOW DO WE TREAT THIS?  There are several ways to treat this condition.  Treatment may depend on many factors such as age, medications, pregnancy, liver and kidney conditions, etc.  It is best to ask your doctor which options are available to you.  1. No treatment.   Unlike many other medical concerns, you can live with this condition.  However for many people this can be a painful condition and may lead to ingrown toenails or a bacterial infection.  It is recommended that you keep the  nails cut short to help reduce the amount of fungal nail. 2. Topical treatment.  These range from herbal remedies to prescription strength nail lacquers.  About 40-50% effective, topicals require twice daily application for approximately 9 to 12 months or until an entirely new nail has grown out.  The most effective topicals are medical grade medications available through physicians offices. 3. Oral antifungal medications.  With an 80-90% cure rate, the most common oral medication requires 3 to 4 months of therapy and stays in your system for a year as the new nail grows out.  Oral antifungal medications do require blood work to make sure it is a safe drug for you.  A liver function panel will be performed prior to starting the medication and after the first month of treatment.  It is important to have the blood work performed to avoid any harmful side effects.  In general, this medication safe but blood work is required. 4. Laser Therapy.  This treatment is performed by applying a specialized laser to the affected nail plate.  This therapy is noninvasive, fast, and non-painful.  It is not covered by insurance and is therefore, out of pocket.  The results have been very good with a 80-95% cure rate.  The [facility] is the only practice in the area to  offer this therapy. 5. Permanent Nail Avulsion.  Removing the entire nail so that a new nail will not grow back.

## 2013-04-16 NOTE — Telephone Encounter (Signed)
Left message for pt to return my call so I can reschedule her missed genetic appt.

## 2013-04-16 NOTE — Assessment & Plan Note (Addendum)
Will treat with terbinafine x 3 months. Check CMET today to ensure baseline LFT's normal. Will repeat CMET in 6 weeks to monitor for safety. Handout given to pt and course of treatment explained. F/u in 6 months to recheck toenails.

## 2013-04-17 ENCOUNTER — Encounter: Payer: Self-pay | Admitting: Family Medicine

## 2013-04-20 ENCOUNTER — Telehealth: Payer: Self-pay | Admitting: *Deleted

## 2013-04-20 NOTE — Telephone Encounter (Signed)
Left message for a return phone call to reschedule patient's missed genetics appt. Awaiting patient response.

## 2013-04-23 ENCOUNTER — Encounter: Payer: Self-pay | Admitting: *Deleted

## 2013-04-23 NOTE — Progress Notes (Signed)
Unable to get in touch with pt.  Mailed letter for pt to contact the office.  Emailed referring to make them aware.

## 2013-05-21 ENCOUNTER — Other Ambulatory Visit: Payer: Medicaid Other

## 2013-05-21 DIAGNOSIS — B351 Tinea unguium: Secondary | ICD-10-CM

## 2013-05-21 LAB — COMPREHENSIVE METABOLIC PANEL
ALBUMIN: 3.8 g/dL (ref 3.5–5.2)
ALK PHOS: 59 U/L (ref 39–117)
ALT: 10 U/L (ref 0–35)
AST: 10 U/L (ref 0–37)
BILIRUBIN TOTAL: 0.3 mg/dL (ref 0.2–1.2)
BUN: 10 mg/dL (ref 6–23)
CO2: 28 mEq/L (ref 19–32)
Calcium: 9.1 mg/dL (ref 8.4–10.5)
Chloride: 104 mEq/L (ref 96–112)
Creat: 0.66 mg/dL (ref 0.50–1.10)
GLUCOSE: 89 mg/dL (ref 70–99)
POTASSIUM: 4.1 meq/L (ref 3.5–5.3)
SODIUM: 142 meq/L (ref 135–145)
TOTAL PROTEIN: 6.6 g/dL (ref 6.0–8.3)

## 2013-05-21 NOTE — Progress Notes (Signed)
CMP DONE TODAY Angelica Brown 

## 2013-05-22 ENCOUNTER — Encounter: Payer: Self-pay | Admitting: Family Medicine

## 2013-10-24 ENCOUNTER — Ambulatory Visit: Payer: Medicaid Other | Admitting: Family Medicine

## 2013-12-06 ENCOUNTER — Emergency Department (INDEPENDENT_AMBULATORY_CARE_PROVIDER_SITE_OTHER): Payer: Self-pay

## 2013-12-06 ENCOUNTER — Encounter (HOSPITAL_COMMUNITY): Payer: Self-pay | Admitting: Emergency Medicine

## 2013-12-06 ENCOUNTER — Emergency Department (INDEPENDENT_AMBULATORY_CARE_PROVIDER_SITE_OTHER)
Admission: EM | Admit: 2013-12-06 | Discharge: 2013-12-06 | Disposition: A | Discharge status: Home / Self Care | Payer: Self-pay | Source: Home / Self Care | Attending: Emergency Medicine | Admitting: Emergency Medicine

## 2013-12-06 DIAGNOSIS — J069 Acute upper respiratory infection, unspecified: Secondary | ICD-10-CM

## 2013-12-06 DIAGNOSIS — J189 Pneumonia, unspecified organism: Secondary | ICD-10-CM

## 2013-12-06 LAB — CBC WITH DIFFERENTIAL/PLATELET
Basophils Absolute: 0.1 10*3/uL (ref 0.0–0.1)
Basophils Relative: 0 % (ref 0–1)
Eosinophils Absolute: 0.2 10*3/uL (ref 0.0–0.7)
Eosinophils Relative: 1 % (ref 0–5)
HCT: 37.5 % (ref 36.0–46.0)
HEMOGLOBIN: 13.5 g/dL (ref 12.0–15.0)
Lymphocytes Relative: 18 % (ref 12–46)
Lymphs Abs: 2.7 10*3/uL (ref 0.7–4.0)
MCH: 29.3 pg (ref 26.0–34.0)
MCHC: 36 g/dL (ref 30.0–36.0)
MCV: 81.3 fL (ref 78.0–100.0)
MONO ABS: 1 10*3/uL (ref 0.1–1.0)
Monocytes Relative: 7 % (ref 3–12)
Neutro Abs: 11 10*3/uL — ABNORMAL HIGH (ref 1.7–7.7)
Neutrophils Relative %: 74 % (ref 43–77)
Platelets: 362 10*3/uL (ref 150–400)
RBC: 4.61 MIL/uL (ref 3.87–5.11)
RDW: 14.4 % (ref 11.5–15.5)
WBC: 14.9 10*3/uL — ABNORMAL HIGH (ref 4.0–10.5)

## 2013-12-06 LAB — POCT I-STAT, CHEM 8
BUN: 4 mg/dL — ABNORMAL LOW (ref 6–23)
CALCIUM ION: 1.19 mmol/L (ref 1.12–1.23)
CHLORIDE: 104 meq/L (ref 96–112)
Creatinine, Ser: 0.8 mg/dL (ref 0.50–1.10)
Glucose, Bld: 99 mg/dL (ref 70–99)
HEMATOCRIT: 42 % (ref 36.0–46.0)
Hemoglobin: 14.3 g/dL (ref 12.0–15.0)
Potassium: 3.9 mEq/L (ref 3.7–5.3)
SODIUM: 139 meq/L (ref 137–147)
TCO2: 25 mmol/L (ref 0–100)

## 2013-12-06 MED ORDER — DEXAMETHASONE SODIUM PHOSPHATE 10 MG/ML IJ SOLN
INTRAMUSCULAR | Status: AC
  Filled 2013-12-06: qty 1

## 2013-12-06 MED ORDER — ALBUTEROL SULFATE (2.5 MG/3ML) 0.083% IN NEBU
5.0000 mg | INHALATION_SOLUTION | Freq: Once | RESPIRATORY_TRACT | Status: AC
  Administered 2013-12-06: 5 mg via RESPIRATORY_TRACT

## 2013-12-06 MED ORDER — DEXAMETHASONE SODIUM PHOSPHATE 10 MG/ML IJ SOLN
10.0000 mg | Freq: Once | INTRAMUSCULAR | Status: AC
  Administered 2013-12-06: 10 mg via INTRAMUSCULAR

## 2013-12-06 MED ORDER — MINOCYCLINE HCL 100 MG PO CAPS: 100.0000 mg | ORAL_CAPSULE | Freq: Two times a day (BID) | ORAL | Status: DC

## 2013-12-06 MED ORDER — IPRATROPIUM BROMIDE 0.02 % IN SOLN
0.5000 mg | Freq: Once | RESPIRATORY_TRACT | Status: AC
  Administered 2013-12-06: 0.5 mg via RESPIRATORY_TRACT

## 2013-12-06 MED ORDER — ALBUTEROL SULFATE (2.5 MG/3ML) 0.083% IN NEBU
INHALATION_SOLUTION | RESPIRATORY_TRACT | Status: AC
  Filled 2013-12-06: qty 3

## 2013-12-06 MED ORDER — ALBUTEROL SULFATE HFA 108 (90 BASE) MCG/ACT IN AERS: 2.0000 | INHALATION_SPRAY | RESPIRATORY_TRACT | Status: DC | PRN

## 2013-12-06 MED ORDER — CEFTRIAXONE SODIUM 1 G IJ SOLR
1.0000 g | Freq: Once | INTRAMUSCULAR | Status: AC
  Administered 2013-12-06: 1 g via INTRAMUSCULAR

## 2013-12-06 MED ORDER — AEROCHAMBER PLUS FLO-VU LARGE MISC: 1.0000 | Freq: Once | Status: DC

## 2013-12-06 MED ORDER — CEFTRIAXONE SODIUM 1 G IJ SOLR
INTRAMUSCULAR | Status: AC
  Filled 2013-12-06: qty 10

## 2013-12-06 MED ORDER — LIDOCAINE HCL (PF) 1 % IJ SOLN
INTRAMUSCULAR | Status: AC
  Filled 2013-12-06: qty 5

## 2013-12-06 MED ORDER — IPRATROPIUM-ALBUTEROL 0.5-2.5 (3) MG/3ML IN SOLN
RESPIRATORY_TRACT | Status: AC
  Filled 2013-12-06: qty 3

## 2013-12-06 MED ORDER — PREDNISONE 10 MG PO TABS: ORAL_TABLET | ORAL | Status: DC

## 2013-12-06 MED ORDER — ALBUTEROL SULFATE HFA 108 (90 BASE) MCG/ACT IN AERS
INHALATION_SPRAY | RESPIRATORY_TRACT | Status: AC
  Filled 2013-12-06: qty 6.7

## 2013-12-06 MED ORDER — GUAIFENESIN-CODEINE 100-10 MG/5ML PO SOLN: 10.0000 mL | ORAL | Status: DC | PRN

## 2013-12-06 NOTE — ED Provider Notes (Signed)
CSN: [id number]     Arrival date & time 12/06/13  1759 History   First MD Initiated Contact with Patient 12/06/13 1809     Chief Complaint  Patient presents with  . Fever   (Consider location/radiation/quality/duration/timing/severity/associated sxs/prior Treatment) HPI Comments: 38 year old female presents complaining of being sick for 4 days. She started with a scratchy throat and has since developed gradual worsening of cough, chest tightness, vomiting, shortness of breath, chest heaviness, wheezing, right ear pain, headache, and body aches. She denies any recent travel or sick contacts. She has been taking over-the-counter medications without relief.  Patient is a 38 y.o. female presenting with fever.  Fever Associated symptoms: chest pain, chills, congestion, cough, ear pain, headaches, myalgias, sore throat and vomiting   Associated symptoms: no dysuria, no nausea and no rash     Past Medical History  Diagnosis Date  . Sickle cell trait   . Hypertension   . Dermatophytosis of nail   . OBESITY, NOS   . OBESITY, MORBID   . TOBACCO DEPENDENCE   . URI   . CHOLELITHIASIS   . Metrorrhagia   . SYNCOPE   . POSTURAL LIGHTHEADEDNESS   . Throat pain   . Heartburn   . Urinary frequency    Past Surgical History  Procedure Laterality Date  . Tubal ligation  2007  . Wisdom tooth extraction  1998  . Bartholin gland cyst excision  2004   History reviewed. No pertinent family history. History  Substance Use Topics  . Smoking status: Current Every Day Smoker -- 10 years    Types: Cigarettes  . Smokeless tobacco: Never Used  . Alcohol Use: No   OB History   Grav Para Term Preterm Abortions TAB SAB Ect Mult Living   5 5 4 1     1 4      Review of Systems  Constitutional: Positive for fever, chills and fatigue.  HENT: Positive for congestion, ear pain and sore throat. Negative for postnasal drip and sinus pressure.   Eyes: Negative for visual disturbance.  Respiratory: Positive  for cough, chest tightness, shortness of breath and wheezing.   Cardiovascular: Positive for chest pain. Negative for palpitations and leg swelling.  Gastrointestinal: Positive for vomiting. Negative for nausea and abdominal pain.  Endocrine: Negative for polydipsia and polyuria.  Genitourinary: Negative for dysuria, urgency and frequency.  Musculoskeletal: Positive for arthralgias and myalgias.  Skin: Negative for rash.  Neurological: Positive for headaches. Negative for dizziness, weakness and light-headedness.  All other systems reviewed and are negative.   Allergies  Review of patient's allergies indicates no known allergies.  Home Medications   Prior to Admission medications   Medication Sig Start Date End Date Taking? Authorizing Provider  Pseudoeph-Doxylamine-DM-APAP (NYQUIL PO) Take by mouth.   Yes Historical Provider, MD  guaiFENesin-codeine 100-10 MG/5ML syrup Take 10 mLs by mouth every 4 (four) hours as needed for cough. 12/06/13   Graylon Good, PA-C  minocycline (MINOCIN) 100 MG capsule Take 1 capsule (100 mg total) by mouth 2 (two) times daily. 12/06/13   Graylon Good, PA-C  predniSONE (DELTASONE) 10 MG tablet 4 tabs PO QD for 4 days; 3 tabs PO QD for 3 days; 2 tabs PO QD for 2 days; 1 tab PO QD for 1 day 12/06/13   Graylon Good, PA-C  terbinafine (LAMISIL) 250 MG tablet Take 1 tablet (250 mg total) by mouth daily. 04/16/13   Latrelle Dodrill, MD   BP 134/82  Pulse 109  Temp(Src) 101.2 F (38.4 C) (Oral)  Resp 20  SpO2 96%  LMP 09/29/2013 Physical Exam  Nursing note and vitals reviewed. Constitutional: She is oriented to person, place, and time. Vital signs are normal. She appears well-developed and well-nourished. No distress.  HENT:  Head: Normocephalic and atraumatic.  Right Ear: External ear normal. Tympanic membrane is injected.  Left Ear: External ear normal. Tympanic membrane is not injected.  Nose: Nose normal.  Mouth/Throat: Oropharynx is clear  and moist. No oropharyngeal exudate.  Eyes: Conjunctivae are normal. Right eye exhibits no discharge. Left eye exhibits no discharge.  Neck: Normal range of motion. Neck supple.  Cardiovascular: Normal rate, regular rhythm and normal heart sounds.   Pulmonary/Chest: Effort normal. No respiratory distress. She has wheezes (diffuse, expiratory). She has no rhonchi. She has rales (LUL).  Neurological: She is alert and oriented to person, place, and time. She has normal strength. Coordination normal.  Skin: Skin is warm and dry. No rash noted. She is not diaphoretic.  Psychiatric: She has a normal mood and affect. Judgment normal.    ED Course  Procedures (including critical care time) Labs Review Labs Reviewed  CBC WITH DIFFERENTIAL - Abnormal; Notable for the following:    WBC 14.9 (*)    Neutro Abs 11.0 (*)    All other components within normal limits  POCT I-STAT, CHEM 8 - Abnormal; Notable for the following:    BUN 4 (*)    All other components within normal limits    Imaging Review Dg Chest 2 View  12/06/2013   CLINICAL DATA:  Fever.  Wheezing.  EXAM: CHEST  2 VIEW  COMPARISON:  Chest x-ray 07/27/2011.  FINDINGS: Mediastinum and hilar structures normal. Borderline cardiomegaly. Normal pulmonary vascularity. Diffuse interstitial prominence noted. This suggests mild pneumonitis. No pleural effusion or pneumothorax.  IMPRESSION: 1. Diffuse mild interstitial prominence suggesting pneumonitis. 2. Borderline cardiomegaly. Normal pulmonary vascularity. No pleural effusion.   Electronically Signed   By: Maisie Fus  Register   On: 12/06/2013 19:20     MDM   1. URI (upper respiratory infection)   2. Pneumonitis    After 2 breathing treatments, she is breathing comfortably. Oxygen saturation is 97%. Wheezes have resolved. Her symptoms are suggestive of pneumonia but her chest x-ray does not support this. This may be an atypical pneumonia versus very early, versus viral infection. Given the  gradual worsening, her level of discomfort, will cover with antibiotics. She was given a gram of Rocephin and discharged with minocycline to cover atypical pneumonia. Also given a shot of Decadron here and discharged with prednisone. She has been instructed to followup here on Saturday for a recheck. She will go to the emergency department if worsening.   Meds ordered this encounter  Medications  . Pseudoeph-Doxylamine-DM-APAP (NYQUIL PO)    Sig: Take by mouth.  Marland Kitchen albuterol (PROVENTIL) (2.5 MG/3ML) 0.083% nebulizer solution 5 mg    Sig:   . ipratropium (ATROVENT) nebulizer solution 0.5 mg    Sig:   . cefTRIAXone (ROCEPHIN) injection 1 g    Sig:   . dexamethasone (DECADRON) injection 10 mg    Sig:   . albuterol (PROVENTIL) (2.5 MG/3ML) 0.083% nebulizer solution 5 mg    Sig:   . ipratropium (ATROVENT) nebulizer solution 0.5 mg    Sig:   . minocycline (MINOCIN) 100 MG capsule    Sig: Take 1 capsule (100 mg total) by mouth 2 (two) times daily.    Dispense:  14 capsule  Refill:  0    Order Specific Question:  Supervising Provider    Answer:  Lorenz CoasterKELLER, DAVID C [id number]  . guaiFENesin-codeine 100-10 MG/5ML syrup    Sig: Take 10 mLs by mouth every 4 (four) hours as needed for cough.    Dispense:  120 mL    Refill:  0    Order Specific Question:  Supervising Provider    Answer:  Lorenz CoasterKELLER, DAVID C [id number]]  . predniSONE (DELTASONE) 10 MG tablet    Sig: 4 tabs PO QD for 4 days; 3 tabs PO QD for 3 days; 2 tabs PO QD for 2 days; 1 tab PO QD for 1 day    Dispense:  30 tablet    Refill:  0    Order Specific Question:  Supervising Provider    Answer:  Lorenz CoasterKELLER, DAVID C [id number]]  . albuterol (PROVENTIL HFA;VENTOLIN HFA) 108 (90 BASE) MCG/ACT inhaler 2 puff    Sig:   . AEROCHAMBER PLUS FLO-VU LARGE MISC 1 each    Sig:       Graylon GoodZachary H Terrill Wauters, PA-C 12/06/13 2200

## 2013-12-06 NOTE — ED Notes (Signed)
Pt has  Symptoms  Of  fever    Cough   /  Congested      With  Body  Aches       And      Shortness  Of breath            X   4  Days         Pt  Reports  Symptoms  Not  releived  By otc  meds

## 2013-12-06 NOTE — Discharge Instructions (Signed)
Upper Respiratory Infection, Adult °An upper respiratory infection (URI) is also sometimes known as the common cold. The upper respiratory tract includes the nose, sinuses, throat, trachea, and bronchi. Bronchi are the airways leading to the lungs. Most people improve within 1 week, but symptoms can last up to 2 weeks. A residual cough may last even longer.  °CAUSES °Many different viruses can infect the tissues lining the upper respiratory tract. The tissues become irritated and inflamed and often become very moist. Mucus production is also common. A cold is contagious. You can easily spread the virus to others by oral contact. This includes kissing, sharing a glass, coughing, or sneezing. Touching your mouth or nose and then touching a surface, which is then touched by another person, can also spread the virus. °SYMPTOMS  °Symptoms typically develop 1 to 3 days after you come in contact with a cold virus. Symptoms vary from person to person. They may include: °· Runny nose. °· Sneezing. °· Nasal congestion. °· Sinus irritation. °· Sore throat. °· Loss of voice (laryngitis). °· Cough. °· Fatigue. °· Muscle aches. °· Loss of appetite. °· Headache. °· Low-grade fever. °DIAGNOSIS  °You might diagnose your own cold based on familiar symptoms, since most people get a cold 2 to 3 times a year. Your caregiver can confirm this based on your exam. Most importantly, your caregiver can check that your symptoms are not due to another disease such as strep throat, sinusitis, pneumonia, asthma, or epiglottitis. Blood tests, throat tests, and X-rays are not necessary to diagnose a common cold, but they may sometimes be helpful in excluding other more serious diseases. Your caregiver will decide if any further tests are required. °RISKS AND COMPLICATIONS  °You may be at risk for a more severe case of the common cold if you smoke cigarettes, have chronic heart disease (such as heart failure) or lung disease (such as asthma), or if  you have a weakened immune system. The very young and very old are also at risk for more serious infections. Bacterial sinusitis, middle ear infections, and bacterial pneumonia can complicate the common cold. The common cold can worsen asthma and chronic obstructive pulmonary disease (COPD). Sometimes, these complications can require emergency medical care and may be life-threatening. °PREVENTION  °The best way to protect against getting a cold is to practice good hygiene. Avoid oral or hand contact with people with cold symptoms. Wash your hands often if contact occurs. There is no clear evidence that vitamin C, vitamin E, echinacea, or exercise reduces the chance of developing a cold. However, it is always recommended to get plenty of rest and practice good nutrition. °TREATMENT  °Treatment is directed at relieving symptoms. There is no cure. Antibiotics are not effective, because the infection is caused by a virus, not by bacteria. Treatment may include: °· Increased fluid intake. Sports drinks offer valuable electrolytes, sugars, and fluids. °· Breathing heated mist or steam (vaporizer or shower). °· Eating chicken soup or other clear broths, and maintaining good nutrition. °· Getting plenty of rest. °· Using gargles or lozenges for comfort. °· Controlling fevers with ibuprofen or acetaminophen as directed by your caregiver. °· Increasing usage of your inhaler if you have asthma. °Zinc gel and zinc lozenges, taken in the first 24 hours of the common cold, can shorten the duration and lessen the severity of symptoms. Pain medicines may help with fever, muscle aches, and throat pain. A variety of non-prescription medicines are available to treat congestion and runny nose. Your caregiver   can make recommendations and may suggest nasal or lung inhalers for other symptoms.  HOME CARE INSTRUCTIONS   Only take over-the-counter or prescription medicines for pain, discomfort, or fever as directed by your  caregiver.  Use a warm mist humidifier or inhale steam from a shower to increase air moisture. This may keep secretions moist and make it easier to breathe.  Drink enough water and fluids to keep your urine clear or pale yellow.  Rest as needed.  Return to work when your temperature has returned to normal or as your caregiver advises. You may need to stay home longer to avoid infecting others. You can also use a face mask and careful hand washing to prevent spread of the virus. SEEK MEDICAL CARE IF:   After the first few days, you feel you are getting worse rather than better.  You need your caregiver's advice about medicines to control symptoms.  You develop chills, worsening shortness of breath, or brown or red sputum. These may be signs of pneumonia.  You develop yellow or brown nasal discharge or pain in the face, especially when you bend forward. These may be signs of sinusitis.  You develop a fever, swollen neck glands, pain with swallowing, or white areas in the back of your throat. These may be signs of strep throat. SEEK IMMEDIATE MEDICAL CARE IF:   You have a fever.  You develop severe or persistent headache, ear pain, sinus pain, or chest pain.  You develop wheezing, a prolonged cough, cough up blood, or have a change in your usual mucus (if you have chronic lung disease).  You develop sore muscles or a stiff neck. Document Released: 09/29/2000 Document Revised: 06/28/2011 Document Reviewed: 07/11/2013 [facility] Patient Information 2015 [employer], [place]. This information is not intended to replace advice given to you by your health care provider. Make sure you discuss any questions you have with your health care provider.  Pneumonia Pneumonia is an infection of the lungs.  CAUSES Pneumonia may be caused by bacteria or a virus. Usually, these infections are caused by breathing infectious particles into the lungs (respiratory tract). SIGNS AND SYMPTOMS    Cough.  Fever.  Chest pain.  Increased rate of breathing.  Wheezing.  Mucus production. DIAGNOSIS  If you have the common symptoms of pneumonia, your health care provider will typically confirm the diagnosis with a chest X-ray. The X-ray will show an abnormality in the lung (pulmonary infiltrate) if you have pneumonia. Other tests of your blood, urine, or sputum may be done to find the specific cause of your pneumonia. Your health care provider may also do tests (blood gases or pulse oximetry) to see how well your lungs are working. TREATMENT  Some forms of pneumonia may be spread to other people when you cough or sneeze. You may be asked to wear a mask before and during your exam. Pneumonia that is caused by bacteria is treated with antibiotic medicine. Pneumonia that is caused by the influenza virus may be treated with an antiviral medicine. Most other viral infections must run their course. These infections will not respond to antibiotics.  HOME CARE INSTRUCTIONS   Cough suppressants may be used if you are losing too much rest. However, coughing protects you by clearing your lungs. You should avoid using cough suppressants if you can.  Your health care provider may have prescribed medicine if he or she thinks your pneumonia is caused by bacteria or influenza. Finish your medicine even if you start to feel better.  Your health care provider may also prescribe an expectorant. This loosens the mucus to be coughed up.  Take medicines only as directed by your health care provider.  Do not smoke. Smoking is a common cause of bronchitis and can contribute to pneumonia. If you are a smoker and continue to smoke, your cough may last several weeks after your pneumonia has cleared.  A cold steam vaporizer or humidifier in your room or home may help loosen mucus.  Coughing is often worse at night. Sleeping in a semi-upright position in a recliner or using a couple pillows under your head will  help with this.  Get rest as you feel it is needed. Your body will usually let you know when you need to rest. PREVENTION A pneumococcal shot (vaccine) is available to prevent a common bacterial cause of pneumonia. This is usually suggested for:  People over 24 years old.  Patients on chemotherapy.  People with chronic lung problems, such as bronchitis or emphysema.  People with immune system problems. If you are over 65 or have a high risk condition, you may receive the pneumococcal vaccine if you have not received it before. In some countries, a routine influenza vaccine is also recommended. This vaccine can help prevent some cases of pneumonia.You may be offered the influenza vaccine as part of your care. If you smoke, it is time to quit. You may receive instructions on how to stop smoking. Your health care provider can provide medicines and counseling to help you quit. SEEK MEDICAL CARE IF: You have a fever. SEEK IMMEDIATE MEDICAL CARE IF:   Your illness becomes worse. This is especially true if you are elderly or weakened from any other disease.  You cannot control your cough with suppressants and are losing sleep.  You begin coughing up blood.  You develop pain which is getting worse or is uncontrolled with medicines.  Any of the symptoms which initially brought you in for treatment are getting worse rather than better.  You develop shortness of breath or chest pain. MAKE SURE YOU:   Understand these instructions.  Will watch your condition.  Will get help right away if you are not doing well or get worse. Document Released: 04/05/2005 Document Revised: 08/20/2013 Document Reviewed: 06/25/2010 [facility] Patient Information 2015 [place], [place]. This information is not intended to replace advice given to you by your health care provider. Make sure you discuss any questions you have with your health care provider.

## 2013-12-06 NOTE — ED Provider Notes (Signed)
Medical screening examination/treatment/procedure(s) were performed by non-physician practitioner and as supervising physician I was immediately available for consultation/collaboration.  Adama Ferber, M.D.  Bryley Kovacevic C Inez Stantz, MD 12/06/13 2255 

## 2014-02-18 ENCOUNTER — Encounter (HOSPITAL_COMMUNITY): Payer: Self-pay | Admitting: Emergency Medicine

## 2014-03-07 ENCOUNTER — Emergency Department (HOSPITAL_COMMUNITY): Payer: Self-pay

## 2014-03-07 ENCOUNTER — Encounter (HOSPITAL_COMMUNITY): Payer: Self-pay | Admitting: Emergency Medicine

## 2014-03-07 ENCOUNTER — Emergency Department (HOSPITAL_COMMUNITY)
Admission: EM | Admit: 2014-03-07 | Discharge: 2014-03-07 | Disposition: A | Discharge status: Home / Self Care | Payer: Self-pay | Attending: Emergency Medicine | Admitting: Emergency Medicine

## 2014-03-07 DIAGNOSIS — Y9289 Other specified places as the place of occurrence of the external cause: Secondary | ICD-10-CM | POA: Insufficient documentation

## 2014-03-07 DIAGNOSIS — Z792 Long term (current) use of antibiotics: Secondary | ICD-10-CM | POA: Insufficient documentation

## 2014-03-07 DIAGNOSIS — R52 Pain, unspecified: Secondary | ICD-10-CM

## 2014-03-07 DIAGNOSIS — Z720 Tobacco use: Secondary | ICD-10-CM | POA: Insufficient documentation

## 2014-03-07 DIAGNOSIS — S6992XA Unspecified injury of left wrist, hand and finger(s), initial encounter: Secondary | ICD-10-CM | POA: Insufficient documentation

## 2014-03-07 DIAGNOSIS — Y998 Other external cause status: Secondary | ICD-10-CM | POA: Insufficient documentation

## 2014-03-07 DIAGNOSIS — Z8709 Personal history of other diseases of the respiratory system: Secondary | ICD-10-CM | POA: Insufficient documentation

## 2014-03-07 DIAGNOSIS — Z8742 Personal history of other diseases of the female genital tract: Secondary | ICD-10-CM | POA: Insufficient documentation

## 2014-03-07 DIAGNOSIS — Z8719 Personal history of other diseases of the digestive system: Secondary | ICD-10-CM | POA: Insufficient documentation

## 2014-03-07 DIAGNOSIS — Z8619 Personal history of other infectious and parasitic diseases: Secondary | ICD-10-CM | POA: Insufficient documentation

## 2014-03-07 DIAGNOSIS — Z862 Personal history of diseases of the blood and blood-forming organs and certain disorders involving the immune mechanism: Secondary | ICD-10-CM | POA: Insufficient documentation

## 2014-03-07 DIAGNOSIS — I10 Essential (primary) hypertension: Secondary | ICD-10-CM | POA: Insufficient documentation

## 2014-03-07 DIAGNOSIS — Z79899 Other long term (current) drug therapy: Secondary | ICD-10-CM | POA: Insufficient documentation

## 2014-03-07 DIAGNOSIS — W228XXA Striking against or struck by other objects, initial encounter: Secondary | ICD-10-CM | POA: Insufficient documentation

## 2014-03-07 DIAGNOSIS — Y9389 Activity, other specified: Secondary | ICD-10-CM | POA: Insufficient documentation

## 2014-03-07 MED ORDER — IBUPROFEN 600 MG PO TABS: 600.0000 mg | ORAL_TABLET | Freq: Four times a day (QID) | ORAL | Status: DC | PRN

## 2014-03-07 MED ORDER — HYDROCODONE-ACETAMINOPHEN 5-325 MG PO TABS: 2.0000 | ORAL_TABLET | ORAL | Status: DC | PRN

## 2014-03-07 NOTE — ED Notes (Signed)
Carrying bag of cans on left arm yesterday, went to drop them and they got hung on wrist. Went to work today and felt something pop in wrist. Pain radiating from wrist up left forearm since

## 2014-03-07 NOTE — ED Provider Notes (Signed)
CSN: [id number]     Arrival date & time 03/07/14  2035 History   First MD Initiated Contact with Patient 03/07/14 2038    This chart was scribed for NP, Earley Favor, working with Nelia Shi, MD by Marica Otter, ED Scribe. This patient was seen in room WTR8/WTR8 and the patient's care was started at 8:58 PM.  Chief Complaint  Patient presents with  . Wrist Pain   The history is provided by the patient. No language interpreter was used.   PCP: Levert Feinstein, MD HPI Comments: Angelica Brown is a 38 y.o. female, with medical Hx noted below, who presents to the Emergency Department complaining of traumatic, acute, sudden onset left wrist pain onset yesterday after a bag of groceries got stuck in her finger causing her entire wrist to bend backwards. Pt complains of associated decreased ROM stating that she cannot make a fist. Pt reports taking three tylenol, icing and heating the area without relief.  Past Medical History  Diagnosis Date  . Sickle cell trait   . Hypertension   . Dermatophytosis of nail   . OBESITY, NOS   . OBESITY, MORBID   . TOBACCO DEPENDENCE   . URI   . CHOLELITHIASIS   . Metrorrhagia   . SYNCOPE   . POSTURAL LIGHTHEADEDNESS   . Throat pain   . Heartburn   . Urinary frequency    Past Surgical History  Procedure Laterality Date  . Tubal ligation  2007  . Wisdom tooth extraction  1998  . Bartholin gland cyst excision  2004   History reviewed. No pertinent family history. History  Substance Use Topics  . Smoking status: Current Every Day Smoker -- 10 years    Types: Cigarettes  . Smokeless tobacco: Never Used  . Alcohol Use: No   OB History    Gravida Para Term Preterm AB TAB SAB Ectopic Multiple Living   5 5 4 1     1 4      Review of Systems  Constitutional: Negative for fever and chills.  Musculoskeletal:       Left wrist pain with associated decreased ROM  Psychiatric/Behavioral: Negative for confusion.  All other systems reviewed  and are negative.     Allergies  Review of patient's allergies indicates no known allergies.  Home Medications   Prior to Admission medications   Medication Sig Start Date End Date Taking? Authorizing Provider  acetaminophen (TYLENOL) 325 MG tablet Take 650 mg by mouth every 6 (six) hours as needed for moderate pain (arm pain).   Yes Historical Provider, MD  guaiFENesin-codeine 100-10 MG/5ML syrup Take 10 mLs by mouth every 4 (four) hours as needed for cough. 12/06/13   Graylon Good, PA-C  HYDROcodone-acetaminophen (NORCO/VICODIN) 5-325 MG per tablet Take 2 tablets by mouth every 4 (four) hours as needed. 03/07/14   Arman Filter, NP  ibuprofen (ADVIL,MOTRIN) 600 MG tablet Take 1 tablet (600 mg total) by mouth every 6 (six) hours as needed. 03/07/14   Arman Filter, NP  minocycline (MINOCIN) 100 MG capsule Take 1 capsule (100 mg total) by mouth 2 (two) times daily. 12/06/13   Graylon Good, PA-C  predniSONE (DELTASONE) 10 MG tablet 4 tabs PO QD for 4 days; 3 tabs PO QD for 3 days; 2 tabs PO QD for 2 days; 1 tab PO QD for 1 day 12/06/13   Graylon Good, PA-C  Pseudoeph-Doxylamine-DM-APAP (NYQUIL PO) Take by mouth.    Historical Provider, MD  terbinafine (LAMISIL) 250 MG tablet Take 1 tablet (250 mg total) by mouth daily. 04/16/13   Latrelle DodrillBrittany J McIntyre, MD   Triage Vitals: BP 131/81 mmHg  Pulse 79  Temp(Src) 98.1 F (36.7 C) (Oral)  Resp 18  SpO2 98%  LMP 02/13/2014 Physical Exam  Constitutional: She is oriented to person, place, and time. She appears well-developed and well-nourished. No distress.  HENT:  Head: Normocephalic and atraumatic.  Eyes: Conjunctivae and EOM are normal.  Neck: Neck supple. No tracheal deviation present.  Cardiovascular: Normal rate.   Pulmonary/Chest: Effort normal. No respiratory distress.  Musculoskeletal: Normal range of motion.  Neurological: She is alert and oriented to person, place, and time.  Skin: Skin is warm and dry.  Psychiatric: She  has a normal mood and affect. Her behavior is normal.  Nursing note and vitals reviewed.   ED Course  Procedures (including critical care time) DIAGNOSTIC STUDIES: Oxygen Saturation is 98% on RA, nl by my interpretation.    COORDINATION OF CARE: 9:00 PM-Discussed treatment plan which includes imaging with pt at bedside and pt agreed to plan.   Labs Review Labs Reviewed - No data to display  Imaging Review Dg Wrist Complete Left  03/07/2014   CLINICAL DATA:  Hyper extension wrist injury yesterday was carrying grocery bags. Worsening pain and limited range of motion.  EXAM: LEFT WRIST - COMPLETE 3+ VIEW  COMPARISON:  None.  FINDINGS: There is no evidence of fracture or dislocation. There is no evidence of arthropathy or other focal bone abnormality. Subcentimeter carpal intra-articular fluffy calcification. Soft tissues are unremarkable.  IMPRESSION: No acute fracture point dislocation, no acute osseous process.  Intra-articular calcification may reflect CPPD or, less likely loose body.   Electronically Signed   By: Awilda Metroourtnay  Bloomer   On: 03/07/2014 21:30     EKG Interpretation None     wrist splint applied  MDM   Final diagnoses:  Pain        Arman FilterGail K Ismahan Lippman, NP 03/07/14 2153  Nelia Shiobert L Beaton, MD 03/13/14 2142

## 2014-04-06 ENCOUNTER — Encounter (HOSPITAL_COMMUNITY): Payer: Self-pay

## 2014-04-06 ENCOUNTER — Emergency Department (HOSPITAL_COMMUNITY)
Admission: EM | Admit: 2014-04-06 | Discharge: 2014-04-06 | Disposition: A | Discharge status: Home / Self Care | Payer: Self-pay | Attending: Emergency Medicine | Admitting: Emergency Medicine

## 2014-04-06 ENCOUNTER — Emergency Department (HOSPITAL_COMMUNITY): Payer: Self-pay

## 2014-04-06 DIAGNOSIS — Z8719 Personal history of other diseases of the digestive system: Secondary | ICD-10-CM | POA: Insufficient documentation

## 2014-04-06 DIAGNOSIS — Z8742 Personal history of other diseases of the female genital tract: Secondary | ICD-10-CM | POA: Insufficient documentation

## 2014-04-06 DIAGNOSIS — Z720 Tobacco use: Secondary | ICD-10-CM | POA: Insufficient documentation

## 2014-04-06 DIAGNOSIS — M25531 Pain in right wrist: Secondary | ICD-10-CM | POA: Insufficient documentation

## 2014-04-06 DIAGNOSIS — Z8619 Personal history of other infectious and parasitic diseases: Secondary | ICD-10-CM | POA: Insufficient documentation

## 2014-04-06 DIAGNOSIS — Z792 Long term (current) use of antibiotics: Secondary | ICD-10-CM | POA: Insufficient documentation

## 2014-04-06 DIAGNOSIS — R52 Pain, unspecified: Secondary | ICD-10-CM

## 2014-04-06 DIAGNOSIS — I10 Essential (primary) hypertension: Secondary | ICD-10-CM | POA: Insufficient documentation

## 2014-04-06 DIAGNOSIS — Z862 Personal history of diseases of the blood and blood-forming organs and certain disorders involving the immune mechanism: Secondary | ICD-10-CM | POA: Insufficient documentation

## 2014-04-06 DIAGNOSIS — Z8709 Personal history of other diseases of the respiratory system: Secondary | ICD-10-CM | POA: Insufficient documentation

## 2014-04-06 MED ORDER — KETOROLAC TROMETHAMINE 60 MG/2ML IM SOLN
60.0000 mg | Freq: Once | INTRAMUSCULAR | Status: AC
  Administered 2014-04-06: 60 mg via INTRAMUSCULAR
  Filled 2014-04-06: qty 2

## 2014-04-06 MED ORDER — PREDNISONE 20 MG PO TABS: ORAL_TABLET | ORAL | Status: DC

## 2014-04-06 MED ORDER — HYDROCODONE-ACETAMINOPHEN 5-325 MG PO TABS: 1.0000 | ORAL_TABLET | Freq: Every evening | ORAL | Status: DC | PRN

## 2014-04-06 NOTE — ED Notes (Signed)
Pt states pain in rt wrist hand and shooting up to elbow. Does not recall injury.  Is using meds, ice, creams at home with no relief.  Pt is on computer all day

## 2014-04-06 NOTE — Discharge Instructions (Signed)
Read the information below.  Use the prescribed medication as directed.  Please discuss all new medications with your pharmacist.  Do not take additional tylenol while taking the prescribed pain medication to avoid overdose.  You may return to the Emergency Department at any time for worsening condition or any new symptoms that concern you.    If you develop uncontrolled pain, weakness or numbness of the extremity, severe discoloration of the skin, or you are unable to use your hand, return to the ER for a recheck.    °

## 2014-04-06 NOTE — ED Provider Notes (Signed)
CSN: [id number]     Arrival date & time 04/06/14  1249 History  This chart was scribed for non-physician practitioner working with Donnetta HutchingBrian Cook, MD, by Lionel DecemberHatice Demirci, ED Scribe. This patient was seen in room WTR5/WTR5 and the patient's care was started at 1:46 PM.   Chief Complaint  Patient presents with  . Wrist Pain     (Consider location/radiation/quality/duration/timing/severity/associated sxs/prior Treatment) The history is provided by the patient. No language interpreter was used.    HPI Comments: Angelica Brown is a 38 y.o. female who presents to the Emergency Department complaining of constant right wrist pain that began four days ago, but has worsened in the last three days.  She came in three weeks ago due to soft tissue swelling on her left wrist however she states that has since been resolved. There is associated numbness/ tingling in her three fingertips (thumb, index and middle) of her right hand, decreased ROM of the thumb index and middle finger, and swelling to the right wrist. She has tried  Bengay, Advil, ibuprofen without any significant relief.   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 She denies any recent falls, injury, bites, activity, or heavy lifting.  She has no other symptoms.  States the current symptoms on the right side are exactly the same as the ones she had on the left that resolved spontaneously after a few days.   Past Medical History  Diagnosis Date  . Sickle cell trait   . Hypertension   . Dermatophytosis of nail   . OBESITY, NOS   . OBESITY, MORBID   . TOBACCO DEPENDENCE   . URI   . CHOLELITHIASIS   . Metrorrhagia   . SYNCOPE   . POSTURAL LIGHTHEADEDNESS   . Throat pain   . Heartburn   . Urinary frequency    Past Surgical History  Procedure Laterality Date  . Tubal ligation  2007  . Wisdom tooth extraction  1998  . Bartholin gland cyst excision  2004   History reviewed. No pertinent family  history. History  Substance Use Topics  . Smoking status: Current Every Day Smoker -- 10 years    Types: Cigarettes  . Smokeless tobacco: Never Used  . Alcohol Use: No   OB History    Gravida Para Term Preterm AB TAB SAB Ectopic Multiple Living   5 5 4 1     1 4      Review of Systems  Constitutional: Negative for fever and activity change.  Musculoskeletal: Positive for arthralgias (right wrist).  Skin: Negative for color change and wound.  Allergic/Immunologic: Negative for immunocompromised state.  Neurological: Positive for weakness and numbness.  Psychiatric/Behavioral: Negative for self-injury.      Allergies  Review of patient's allergies indicates no known allergies.  Home Medications   Prior to Admission medications   Medication Sig Start Date End Date Taking? Authorizing Provider  acetaminophen (TYLENOL) 325 MG tablet Take 650 mg by mouth every 6 (six) hours as needed for moderate pain (arm pain).    Historical Provider, MD  guaiFENesin-codeine 100-10 MG/5ML syrup Take 10 mLs by mouth every 4 (four) hours as needed for cough. 12/06/13   Graylon GoodZachary H Baker, PA-C  HYDROcodone-acetaminophen (NORCO/VICODIN) 5-325 MG per tablet Take 2 tablets by mouth every 4 (four) hours as needed. 03/07/14   Arman FilterGail K Schulz, NP  ibuprofen (ADVIL,MOTRIN) 600 MG tablet Take 1 tablet (600 mg total) by mouth every 6 (six) hours  as needed. 03/07/14   Arman Filter, NP  minocycline (MINOCIN) 100 MG capsule Take 1 capsule (100 mg total) by mouth 2 (two) times daily. 12/06/13   Graylon Good, PA-C  predniSONE (DELTASONE) 10 MG tablet 4 tabs PO QD for 4 days; 3 tabs PO QD for 3 days; 2 tabs PO QD for 2 days; 1 tab PO QD for 1 day 12/06/13   Graylon Good, PA-C  Pseudoeph-Doxylamine-DM-APAP (NYQUIL PO) Take by mouth.    Historical Provider, MD  terbinafine (LAMISIL) 250 MG tablet Take 1 tablet (250 mg total) by mouth daily. 04/16/13   Latrelle Dodrill, MD   BP 132/76 mmHg  Pulse 79  Temp(Src)  98.5 F (36.9 C) (Oral)  Resp 20  SpO2 97%  LMP 03/13/2014 Physical Exam  Constitutional: She appears well-developed and well-nourished. No distress.  HENT:  Head: Normocephalic and atraumatic.  Neck: Neck supple.  Pulmonary/Chest: Effort normal.  Musculoskeletal:       Right wrist: She exhibits decreased range of motion, tenderness and swelling. She exhibits no effusion, no crepitus, no deformity and no laceration.       Arms:      Right hand: She exhibits decreased range of motion and tenderness. She exhibits normal capillary refill, no deformity and no laceration. Normal sensation noted.  Right hand/wrist with very mild edema over ventral wrist and palm of hand.  Capillary refill < 2 seconds.  Sensation intact.  Pt does not move fingers secondary to pain.  She is able to move fingers somewhat.  Does have pain with passive ROM.  No erythema, warmth of any joint.  No focal tenderness.  No induration or fluctance, no bony tenderness.    Neurological: She is alert.  Skin: She is not diaphoretic.  Nursing note and vitals reviewed.   ED Course  Procedures (including critical care time) DIAGNOSTIC STUDIES: Oxygen Saturation is 97% on RA, normal by my interpretation.    COORDINATION OF CARE: 1:51 PM- Pt advised of plan for treatment and pt agrees.  Labs Review Labs Reviewed - No data to display  Imaging Review Dg Wrist Complete Right  04/06/2014   CLINICAL DATA:  Wrist pain since Tuesday, no known trauma  EXAM: RIGHT WRIST - COMPLETE 3+ VIEW  COMPARISON:  None.  FINDINGS: No fracture or dislocation is seen.  Negative ulnar variance.  Joint spaces are essentially preserved.  Capsular calcification versus dystrophic soft tissue calcification along the dorsal aspect of the distal radius on the lateral view. This appearance does not look suspicious for avulsion fracture.  IMPRESSION: No fracture or dislocation is seen.   Electronically Signed   By: Charline Bills M.D.   On: 04/06/2014  13:44     EKG Interpretation None      MDM   Final diagnoses:  Pain  Right wrist pain    Afebrile, nontoxic patient with right wrist pain that is similar to prior left wrist pain she has had.  Has some tingling and pain in the medial distribution, somewhat in the radial distribution as well.  Clinically there is no e/o septic joint or gout.  Suspect carpal tunnel.  Xray negative.   D/C home with norco, wrist splint, short course prednisone.  Pt has ibuprofen at home.  Hand surgery follow up.  Discussed result, findings, treatment, and follow up  with patient.  Pt given return precautions.  Pt verbalizes understanding and agrees with plan.        I personally performed the  services described in this documentation, which was scribed in my presence. The recorded information has been reviewed and is accurate.    Trixie Dredge, PA-C 04/06/14 1510  Donnetta Hutching, MD 04/06/14 [id number]

## 2014-11-08 ENCOUNTER — Encounter: Payer: Self-pay | Admitting: Family Medicine

## 2014-11-18 DIAGNOSIS — J189 Pneumonia, unspecified organism: Secondary | ICD-10-CM

## 2014-11-18 HISTORY — DX: Pneumonia, unspecified organism: J18.9

## 2014-11-23 ENCOUNTER — Encounter (HOSPITAL_COMMUNITY): Payer: Self-pay | Admitting: Emergency Medicine

## 2014-11-23 ENCOUNTER — Emergency Department (HOSPITAL_COMMUNITY)
Admission: EM | Admit: 2014-11-23 | Discharge: 2014-11-23 | Disposition: A | Discharge status: Home / Self Care | Payer: Commercial Managed Care - HMO | Attending: Emergency Medicine | Admitting: Emergency Medicine

## 2014-11-23 ENCOUNTER — Emergency Department (HOSPITAL_COMMUNITY): Payer: Commercial Managed Care - HMO

## 2014-11-23 DIAGNOSIS — I10 Essential (primary) hypertension: Secondary | ICD-10-CM | POA: Insufficient documentation

## 2014-11-23 DIAGNOSIS — Z720 Tobacco use: Secondary | ICD-10-CM | POA: Insufficient documentation

## 2014-11-23 DIAGNOSIS — H9201 Otalgia, right ear: Secondary | ICD-10-CM | POA: Diagnosis not present

## 2014-11-23 DIAGNOSIS — Z8709 Personal history of other diseases of the respiratory system: Secondary | ICD-10-CM | POA: Insufficient documentation

## 2014-11-23 DIAGNOSIS — Z862 Personal history of diseases of the blood and blood-forming organs and certain disorders involving the immune mechanism: Secondary | ICD-10-CM | POA: Diagnosis not present

## 2014-11-23 DIAGNOSIS — R42 Dizziness and giddiness: Secondary | ICD-10-CM

## 2014-11-23 DIAGNOSIS — Z8742 Personal history of other diseases of the female genital tract: Secondary | ICD-10-CM | POA: Diagnosis not present

## 2014-11-23 DIAGNOSIS — Z8619 Personal history of other infectious and parasitic diseases: Secondary | ICD-10-CM | POA: Insufficient documentation

## 2014-11-23 DIAGNOSIS — Z7952 Long term (current) use of systemic steroids: Secondary | ICD-10-CM | POA: Insufficient documentation

## 2014-11-23 MED ORDER — MECLIZINE HCL 25 MG PO TABS
25.0000 mg | ORAL_TABLET | Freq: Once | ORAL | Status: AC
  Administered 2014-11-23: 25 mg via ORAL
  Filled 2014-11-23: qty 1

## 2014-11-23 MED ORDER — MECLIZINE HCL 25 MG PO TABS: 25.0000 mg | ORAL_TABLET | Freq: Three times a day (TID) | ORAL | Status: DC | PRN

## 2014-11-23 NOTE — ED Notes (Addendum)
Pt ambulatory to and from restroom without asistance and with steady gait.

## 2014-11-23 NOTE — Discharge Instructions (Signed)
Take meclizine as needed for dizziness.   Follow up with your doctor.   Return to ER if you have worse dizziness, vertigo, falling.    Vertigo Vertigo means you feel like you are moving when you are not. Vertigo can make you feel like things around you are moving when they are not. This problem often goes away on its own.  HOME CARE   Follow your doctor's instructions.  Avoid driving.  Avoid using heavy machinery.  Avoid doing any activity that could be dangerous if you have a vertigo attack.  Tell your doctor if a medicine seems to cause your vertigo. GET HELP RIGHT AWAY IF:   Your medicines do not help or make you feel worse.  You have trouble talking or walking.  You feel weak or have trouble using your arms, hands, or legs.  You have bad headaches.  You keep feeling sick to your stomach (nauseous) or throwing up (vomiting).  Your vision changes.  A family member notices changes in your behavior.  Your problems get worse. MAKE SURE YOU:  Understand these instructions.  Will watch your condition.  Will get help right away if you are not doing well or get worse. Document Released: 01/13/2008 Document Revised: 06/28/2011 Document Reviewed: 10/22/2010 [facility] Patient Information 2015 [place], [place]. This information is not intended to replace advice given to you by your health care provider. Make sure you discuss any questions you have with your health care provider.

## 2014-11-23 NOTE — ED Provider Notes (Signed)
CSN: [id number]     Arrival date & time 11/23/14  2116 History   First MD Initiated Contact with Patient 11/23/14 2152     Chief Complaint  Patient presents with  . Dizziness  . Otalgia     (Consider location/radiation/quality/duration/timing/severity/associated sxs/prior Treatment) The history is provided by the patient.  Angelica Brown is a 39 y.o. female hx of HTN, BPPV here with dizziness. States that is a history of peripheral vertigo. Intermittently she has dizzy spells. 3 weeks ago she woke up at night and turned her head and had sudden dizziness and vertigo. Since then she has more dizzy spells. Over the last 2 days she states that she sometimes walked into the wall and has worse dizziness. Denies any weakness or numbness or trouble with speech.     Past Medical History  Diagnosis Date  . Sickle cell trait   . Hypertension   . Dermatophytosis of nail   . OBESITY, NOS   . OBESITY, MORBID   . TOBACCO DEPENDENCE   . URI   . CHOLELITHIASIS   . Metrorrhagia   . SYNCOPE   . POSTURAL LIGHTHEADEDNESS   . Throat pain   . Heartburn   . Urinary frequency    Past Surgical History  Procedure Laterality Date  . Tubal ligation  2007  . Wisdom tooth extraction  1998  . Bartholin gland cyst excision  2004   History reviewed. No pertinent family history. History  Substance Use Topics  . Smoking status: Current Every Day Smoker -- 10 years    Types: Cigarettes  . Smokeless tobacco: Never Used  . Alcohol Use: No   OB History    Gravida Para Term Preterm AB TAB SAB Ectopic Multiple Living   5 5 4 1     1 4      Review of Systems  HENT: Positive for ear pain.   Neurological: Positive for dizziness.  All other systems reviewed and are negative.     Allergies  Strawberry  Home Medications   Prior to Admission medications   Medication Sig Start Date End Date Taking? Authorizing Provider  acetaminophen (TYLENOL) 650 MG CR tablet Take 650 mg by mouth every 8 (eight)  hours as needed for pain.    Historical Provider, MD  guaiFENesin-codeine 100-10 MG/5ML syrup Take 10 mLs by mouth every 4 (four) hours as needed for cough. Patient not taking: Reported on 04/06/2014 12/06/13   Graylon Good, PA-C  HYDROcodone-acetaminophen (NORCO/VICODIN) 5-325 MG per tablet Take 1-2 tablets by mouth at bedtime as needed for moderate pain or severe pain. 04/06/14   Trixie Dredge, PA-C  ibuprofen (ADVIL,MOTRIN) 200 MG tablet Take 400-600 mg by mouth every 4 (four) hours as needed for moderate pain.    Historical Provider, MD  ibuprofen (ADVIL,MOTRIN) 600 MG tablet Take 1 tablet (600 mg total) by mouth every 6 (six) hours as needed. Patient not taking: Reported on 04/06/2014 03/07/14   Earley Favor, NP  minocycline (MINOCIN) 100 MG capsule Take 1 capsule (100 mg total) by mouth 2 (two) times daily. Patient not taking: Reported on 04/06/2014 12/06/13   Graylon Good, PA-C  predniSONE (DELTASONE) 20 MG tablet Take 2 tabs PO daily (40mg ) 04/06/14   Trixie Dredge, PA-C  terbinafine (LAMISIL) 250 MG tablet Take 1 tablet (250 mg total) by mouth daily. Patient not taking: Reported on 04/06/2014 04/16/13   Latrelle Dodrill, MD   BP 124/63 mmHg  Pulse 85  Temp(Src) 98.3 F (36.8 C) (  Oral)  Resp 18  Ht  (1.753 m)  Wt 347 lb (157.398 kg)  BMI 51.22 kg/m2  SpO2 96%  LMP 11/14/2014 Physical Exam  Constitutional: She is oriented to person, place, and time. She appears well-developed and well-nourished.  HENT:  Head: Normocephalic.  Right Ear: External ear normal.  Left Ear: External ear normal.  Mouth/Throat: Oropharynx is clear and moist.  Eyes: Conjunctivae and EOM are normal. Pupils are equal, round, and reactive to light.  No obvious nystagmus.   Neck: Normal range of motion. Neck supple.  Cardiovascular: Normal rate, regular rhythm and normal heart sounds.   Pulmonary/Chest: Effort normal and breath sounds normal. No respiratory distress. She has no wheezes. She has no  rales.  Abdominal: Soft. Bowel sounds are normal. She exhibits no distension. There is no tenderness. There is no rebound.  Musculoskeletal: Normal range of motion. She exhibits no edema or tenderness.  Neurological: She is alert and oriented to person, place, and time. No cranial nerve deficit. Coordination normal.  CN 2-12 intact. Nl strength throughout. Nl finger to nose. Neg rhomberg. Nl gait   Skin: Skin is warm and dry.  Psychiatric: She has a normal mood and affect. Her behavior is normal. Judgment and thought content normal.  Nursing note and vitals reviewed.   ED Course  Procedures (including critical care time) Labs Review Labs Reviewed - No data to display  Imaging Review Ct Head Wo Contrast  11/23/2014   CLINICAL DATA:  Dizziness for 3 weeks, right-side ear pain and pressure, history sickle cell trait, hypertension, smoking  EXAM: CT HEAD WITHOUT CONTRAST  TECHNIQUE: Contiguous axial images were obtained from the base of the skull through the vertex without intravenous contrast.  COMPARISON:  None  FINDINGS: Normal ventricular morphology.  No midline shift or mass effect.  Normal appearance of brain parenchyma.  No intracranial hemorrhage, mass lesion, or acute infarction.  Visualized paranasal sinuses and mastoid air cells clear.  Bones unremarkable.  IMPRESSION: Normal exam.   Electronically Signed   By: Ulyses Southward M.D.   On: 11/23/2014 22:33     EKG Interpretation None      MDM   Final diagnoses:  None    Angelica Brown is a 39 y.o. female here with dizziness, vertigo. Likely peripheral vertigo. Will give meclizine and reassess.   10:57 PM Felt better after meclizine. CT head unremarkable. Will dc home with meclizine. Likely peripheral vertigo.     Richardean Canal, MD 11/23/14 [phone]

## 2014-11-23 NOTE — ED Notes (Signed)
Pt arrived to the ED with a complaintof dizziness that has been present for three weeks.  Pt states that she also has right sided ear pain.  Pt states the ear pain feels like a pressure.  Pt states the last two days the dizziness has been progressively worse.

## 2015-02-18 ENCOUNTER — Ambulatory Visit: Payer: Commercial Managed Care - HMO | Admitting: Family Medicine

## 2015-02-21 ENCOUNTER — Ambulatory Visit: Payer: Commercial Managed Care - HMO | Admitting: Family Medicine

## 2015-02-28 ENCOUNTER — Ambulatory Visit (INDEPENDENT_AMBULATORY_CARE_PROVIDER_SITE_OTHER): Payer: Commercial Managed Care - HMO | Admitting: Family Medicine

## 2015-02-28 ENCOUNTER — Other Ambulatory Visit (HOSPITAL_COMMUNITY)
Admission: RE | Admit: 2015-02-28 | Discharge: 2015-02-28 | Disposition: A | Discharge status: Home / Self Care | Payer: Commercial Managed Care - HMO | Source: Ambulatory Visit | Attending: Family Medicine | Admitting: Family Medicine

## 2015-02-28 ENCOUNTER — Encounter: Payer: Self-pay | Admitting: Family Medicine

## 2015-02-28 VITALS — BP 126/78 | HR 67 | Temp 98.0°F | Ht 69.0 in | Wt 350.0 lb

## 2015-02-28 DIAGNOSIS — IMO0001 Reserved for inherently not codable concepts without codable children: Secondary | ICD-10-CM

## 2015-02-28 DIAGNOSIS — Z113 Encounter for screening for infections with a predominantly sexual mode of transmission: Secondary | ICD-10-CM

## 2015-02-28 DIAGNOSIS — N760 Acute vaginitis: Secondary | ICD-10-CM | POA: Diagnosis present

## 2015-02-28 DIAGNOSIS — B351 Tinea unguium: Secondary | ICD-10-CM | POA: Diagnosis not present

## 2015-02-28 DIAGNOSIS — R030 Elevated blood-pressure reading, without diagnosis of hypertension: Secondary | ICD-10-CM

## 2015-02-28 DIAGNOSIS — M79605 Pain in left leg: Secondary | ICD-10-CM

## 2015-02-28 DIAGNOSIS — Z01818 Encounter for other preprocedural examination: Secondary | ICD-10-CM | POA: Diagnosis not present

## 2015-02-28 LAB — BASIC METABOLIC PANEL
BUN: 10 mg/dL (ref 7–25)
CALCIUM: 9.3 mg/dL (ref 8.6–10.2)
CO2: 29 mmol/L (ref 20–31)
Chloride: 105 mmol/L (ref 98–110)
Creat: 0.65 mg/dL (ref 0.50–1.10)
Glucose, Bld: 75 mg/dL (ref 65–99)
POTASSIUM: 4.6 mmol/L (ref 3.5–5.3)
SODIUM: 143 mmol/L (ref 135–146)

## 2015-02-28 LAB — POCT WET PREP (WET MOUNT): Clue Cells Wet Prep Whiff POC: POSITIVE

## 2015-02-28 LAB — RPR TITER: RPR Titer: 1:2 {titer}

## 2015-02-28 LAB — RPR: RPR Ser Ql: REACTIVE — AB

## 2015-02-28 LAB — POCT URINE PREGNANCY: PREG TEST UR: NEGATIVE

## 2015-02-28 NOTE — Patient Instructions (Addendum)
Referring to GYN to talk about tubal ligation reversal Referring to food doctor for your toenails  STD check today - I'll call you with results  For your leg - go get knee xrays Take tylenol for pain  Be well, Dr. Pollie Meyer

## 2015-03-01 LAB — HIV ANTIBODY (ROUTINE TESTING W REFLEX): HIV: NONREACTIVE

## 2015-03-01 LAB — HEPATITIS PANEL, ACUTE
HCV AB: NEGATIVE
HEP A IGM: NONREACTIVE
HEP B C IGM: NONREACTIVE
HEP B S AG: NEGATIVE

## 2015-03-02 NOTE — Progress Notes (Signed)
Date of Visit: 02/28/2015   HPI:  Patient arrives appx 30 minutes late to her visit today. She has multiple items she would like to discuss:  -STD check - wants to be checked for STDs. Having discharge. Periods previously were very heavy but the last 2 months has had just light/spotting. Does have some discomfort with intercourse. Sexually active with one female partner in the last year. No itching or odor. Discharge is creamy white.  -tubal ligation reversal - wants to discuss this with OB/GYN. Willing to be referred for that discussion.  -toenail fungus - previously treated with terbinafine without relief. Toenails hurt. Would like referral to podiatrist.  -left leg pain - for the last 3.5 weeks has had pain ever since got out of car and twisted her ankle. Pain mainly located now in her L knee. No swelling or redness.   ROS: See HPI.  PMFSH: history of onychomycosis, obesity, sickle cell trait, tobacco abuse  PHYSICAL EXAM: BP 126/78 mmHg  Pulse 67  Temp(Src) 98 F (36.7 C) (Oral)  Ht 5\' 9"  (1.753 m)  Wt 350 lb (158.759 kg)  BMI 51.66 kg/m2  LMP 02/14/2015 Gen: NAD, pleasant, cooperative HEENT: normocephalic, atraumatic GU: normal appearing external genitalia without lesions. Vagina is moist with white discharge. Cervix normal in appearance. No cervical motion tenderness or tenderness on bimanual exam. No adnexal masses.  Extremities: L knee atraumatic, no effusion, warmth or erythema. Full strength with knee flexion & extension. MCL & LCL intact to varus & valgus stressing bilaterally. Patella normally mobile.  ASSESSMENT/PLAN:  Tubal ligation reversal: Referred to OB/GYN for discussion of reversal of tubal ligation  STD check/vaginal discharge: -will check gc/chlamydia/trichomonas, wet prep, HIV, RPR, acute hepatitis panel today   L knee pain: -no abnormalities on exam today -check xray of knee -trial of tylenol for pain  Onychomycosis Refer to podiatry per patient  request.  Note - BP initially elevated at 182/93, but was 126/78 on recheck. Continue to monitor at future visits.  FOLLOW UP: F/u as needed if symptoms worsen or do not improve. Referral to GYN & podiatry  Estevan Ryder. Pollie Meyer, MD [facility]

## 2015-03-03 LAB — CERVICOVAGINAL ANCILLARY ONLY
CHLAMYDIA, DNA PROBE: NEGATIVE
Neisseria Gonorrhea: NEGATIVE
Trichomonas: NEGATIVE

## 2015-03-03 LAB — FLUORESCENT TREPONEMAL AB(FTA)-IGG-BLD: FLUORESCENT TREPONEMAL ABS: NONREACTIVE

## 2015-03-04 ENCOUNTER — Telehealth: Payer: Self-pay | Admitting: Family Medicine

## 2015-03-04 MED ORDER — METRONIDAZOLE 500 MG PO TABS: 500.0000 mg | ORAL_TABLET | Freq: Two times a day (BID) | ORAL | Status: DC

## 2015-03-04 NOTE — Assessment & Plan Note (Signed)
Refer to podiatry per patient request.

## 2015-03-04 NOTE — Telephone Encounter (Signed)
Called patient to discuss labs - all STD tests negative but does have BV. Flagyl rx sent in for patient. She was appreciative.  Latrelle Dodrill, MD

## 2015-05-02 ENCOUNTER — Ambulatory Visit (INDEPENDENT_AMBULATORY_CARE_PROVIDER_SITE_OTHER): Payer: Commercial Managed Care - HMO | Admitting: Gynecology

## 2015-05-02 ENCOUNTER — Encounter: Payer: Self-pay | Admitting: Gynecology

## 2015-05-02 ENCOUNTER — Other Ambulatory Visit (HOSPITAL_COMMUNITY)
Admission: RE | Admit: 2015-05-02 | Discharge: 2015-05-02 | Disposition: A | Discharge status: Home / Self Care | Payer: Commercial Managed Care - HMO | Source: Ambulatory Visit | Attending: Gynecology | Admitting: Gynecology

## 2015-05-02 VITALS — BP 117/78 | Ht 67.0 in | Wt 324.0 lb

## 2015-05-02 DIAGNOSIS — Z01419 Encounter for gynecological examination (general) (routine) without abnormal findings: Secondary | ICD-10-CM | POA: Insufficient documentation

## 2015-05-02 DIAGNOSIS — N915 Oligomenorrhea, unspecified: Secondary | ICD-10-CM

## 2015-05-02 DIAGNOSIS — Z1151 Encounter for screening for human papillomavirus (HPV): Secondary | ICD-10-CM | POA: Insufficient documentation

## 2015-05-02 LAB — CBC WITH DIFFERENTIAL/PLATELET
BASOS ABS: 0.1 10*3/uL (ref 0.0–0.1)
Basophils Relative: 1 % (ref 0–1)
EOS ABS: 0.3 10*3/uL (ref 0.0–0.7)
EOS PCT: 2 % (ref 0–5)
HCT: 40.9 % (ref 36.0–46.0)
Hemoglobin: 13.7 g/dL (ref 12.0–15.0)
LYMPHS PCT: 40 % (ref 12–46)
Lymphs Abs: 5 10*3/uL — ABNORMAL HIGH (ref 0.7–4.0)
MCH: 28.8 pg (ref 26.0–34.0)
MCHC: 33.5 g/dL (ref 30.0–36.0)
MCV: 85.9 fL (ref 78.0–100.0)
MPV: 10.4 fL (ref 8.6–12.4)
Monocytes Absolute: 1.3 10*3/uL — ABNORMAL HIGH (ref 0.1–1.0)
Monocytes Relative: 10 % (ref 3–12)
Neutro Abs: 5.9 10*3/uL (ref 1.7–7.7)
Neutrophils Relative %: 47 % (ref 43–77)
PLATELETS: 402 10*3/uL — AB (ref 150–400)
RBC: 4.76 MIL/uL (ref 3.87–5.11)
RDW: 16 % — AB (ref 11.5–15.5)
WBC: 12.5 10*3/uL — AB (ref 4.0–10.5)

## 2015-05-02 LAB — COMPREHENSIVE METABOLIC PANEL
ALK PHOS: 63 U/L (ref 33–115)
ALT: 25 U/L (ref 6–29)
AST: 18 U/L (ref 10–30)
Albumin: 3.9 g/dL (ref 3.6–5.1)
BUN: 10 mg/dL (ref 7–25)
CO2: 28 mmol/L (ref 20–31)
Calcium: 10 mg/dL (ref 8.6–10.2)
Chloride: 103 mmol/L (ref 98–110)
Creat: 0.69 mg/dL (ref 0.50–1.10)
GLUCOSE: 83 mg/dL (ref 65–99)
Potassium: 4.4 mmol/L (ref 3.5–5.3)
Sodium: 140 mmol/L (ref 135–146)
Total Bilirubin: 0.5 mg/dL (ref 0.2–1.2)
Total Protein: 6.9 g/dL (ref 6.1–8.1)

## 2015-05-02 LAB — CHOLESTEROL, TOTAL: Cholesterol: 175 mg/dL (ref 125–200)

## 2015-05-02 NOTE — Progress Notes (Signed)
Angelica Brown Mar 06, 1976 [id number]   History:    40 y.o.  for annual gyn exam who is a new patient to the practice probable. Patient is a gravida 5 para 5 (4 vaginal deliveries at term, one preterm delivery twins of which one died in utero and one died 2 hours after delivery at [redacted] weeks gestation). Patient with prior tubal ligation. Patient has a new partner and would like to Children with her new partner. Patient reports cycles every 28-30 days although at times she will go up to 2 months without a cycle. She denies any nipple discharge, any unusual headaches or blurry vision. She is morbidly obese with a weight of 324,000 the BMI 50.75 kg/m. Her PCP from the [facility] to our office. They had been doing her blood work. Review of her record indicated That she had a STD screen in November of this year. All was negative with exception that her RPR was positive at a titer of 1-2 which was the same as in 2014 which would indicate prior history of infection. Patient denied any past history of any abnormal Pap smear. She did inform me that her mother was diagnosed with breast cancer [place] of 65 and patient has a mammogram scheduled for next week.  Past medical history,surgical history, family history and social history were all reviewed and documented in the EPIC chart.  Gynecologic History Patient's last menstrual period was 04/12/2015. Contraception: tubal ligation Last Pap: 2014. Results were: normal Last mammogram: No previous study. Results were: No previous study  Obstetric History OB History  Gravida Para Term Preterm AB SAB TAB Ectopic Multiple Living  5 5 4 1     1 4     # Outcome Date GA Lbr Len/2nd Weight Sex Delivery Anes PTL Lv  5 Term           4 Term           3 Term           2 Term           1 Preterm                ROS: A ROS was performed and pertinent positives and negatives are included in the history.  GENERAL: No fevers or chills. HEENT: No  change in vision, no earache, sore throat or sinus congestion. NECK: No pain or stiffness. CARDIOVASCULAR: No chest pain or pressure. No palpitations. PULMONARY: No shortness of breath, cough or wheeze. GASTROINTESTINAL: No abdominal pain, nausea, vomiting or diarrhea, melena or bright red blood per rectum. GENITOURINARY: No urinary frequency, urgency, hesitancy or dysuria. MUSCULOSKELETAL: No joint or muscle pain, no back pain, no recent trauma. DERMATOLOGIC: No rash, no itching, no lesions. ENDOCRINE: No polyuria, polydipsia, no heat or cold intolerance. No recent change in weight. HEMATOLOGICAL: No anemia or easy bruising or bleeding. NEUROLOGIC: No headache, seizures, numbness, tingling or weakness. PSYCHIATRIC: No depression, no loss of interest in normal activity or change in sleep pattern.     Exam: chaperone present  BP 117/78 mmHg  Ht 5\' 7"  (1.702 m)  Wt 324 lb (146.965 kg)  BMI 50.73 kg/m2  LMP 04/12/2015  Body mass index is 50.73 kg/(m^2).  General appearance : Well developed well nourished female. No acute distress HEENT: Eyes: no retinal hemorrhage or exudates,  Neck supple, trachea midline, no carotid bruits, no thyroidmegaly Lungs: Clear to auscultation, no rhonchi or wheezes, or rib retractions  Heart: Regular rate  and rhythm, no murmurs or gallops Breast:Examined in sitting and supine position were symmetrical in appearance, no palpable masses or tenderness,  no skin retraction, no nipple inversion, no nipple discharge, no skin discoloration, no axillary or supraclavicular lymphadenopathy Abdomen: no palpable masses or tenderness, no rebound or guarding Extremities: no edema or skin discoloration or tenderness  Pelvic:  Bartholin, Urethra, Skene Glands: Within normal limits             Vagina: No gross lesions or discharge  Cervix: No gross lesions or discharge  Uterus  anteverted, normal size, shape and consistency, non-tender and mobile  Adnexa  Without masses or  tenderness  Anus and perineum  normal   Rectovaginal  normal sphincter tone without palpated masses or tenderness             Hemoccult not indicated     Assessment/Plan:  40 y.o. female for annual exam who is morbidly obese with prior history of tubal ligation as a new partner would like to achieve pregnancy. I am going to refer to the reproductive endocrinologist for consideration of in vitro fertilization. Literature information was provided. Pap smear with HPV screening was done today. The following labs ordered today: CBC, screening cholesterol, comprehensive metabolic panel, TSH, and urinalysis. Because of her history of oligomenorrhea with wanted to check a prolactin and also an [facility] because of some minor vasomotor symptoms that she was expressing as well. Patient declined flu vaccine today.   Ok Edwards MD, 2:51 PM 05/02/2015

## 2015-05-02 NOTE — Patient Instructions (Signed)
In Vitro Fertilization  In vitro fertilization (IVF) is a type of assisted reproductive technology. IVF involves a series of procedures used to treat fertility or genetic problems in order to assist with the conception of a baby. During IVF, eggs are retrieved from the ovaries and combined with sperm in a lab to fertilize the eggs. One or more of the fertilized eggs (embryos) are inserted into the uterus through the cervix. Candidates for IVF include:  · People who are infertile.  · Women who underwent premature menopause or ovarian failure.  · Women who had both ovaries removed. In this case, donor eggs must be used.  · Women who have damaged or blocked fallopian tubes.  There is no age limit for having IVF, but it is not recommended for postmenopausal women. The ideal age for IVF is 35 years old or younger. Women age 41 and older are often counseled to consider using donor eggs during IVF to increase the chances of success.  LET YOUR HEALTH CARE PROVIDER KNOW ABOUT:   · Any allergies you have.  · All medicines you are taking, including vitamins, herbs, eye drops, creams, and over-the-counter medicines.  · Previous problems you or members of your family have had with the use of anesthetics.  · Any medical or genetic problems that you or members of your family have had.  · Any blood disorders you have.  · Previous surgeries you have had.  · Previous pregnancies you have had.  · Medical conditions you have.  · Any excessive alcohol drinking or smoking you have done.  · Any history of taking illegal drugs.  RISKS AND COMPLICATIONS   Generally, IVF procedures are safe. However, as with any procedure, complications can occur. Possible complications include:  · Bleeding and infection.  · Problems with anesthesia.  · Blood clots.  · The procedure not working.  · Having twins or multiples.  · Increased risk of early delivery.  BEFORE THE PROCEDURE   Before beginning a cycle of IVF, you and the donor may need various  screenings to make sure IVF is the correct treatment option. Some people may not benefit from this type of assisted reproductive technology.   · You and the donor will need to provide a complete medical history and the medical history of your families.  · You and the donor will undergo a physical exam.  · You and the donor may need blood tests to check for infectious diseases, including HIV.  · Other tests that may be done include:  ¨ Testing of your ovaries to determine the quality and quantity of your eggs.   ¨ Hormone tests and ovulation testing.  ¨ An exam of your uterus. This may be done by using ultrasound after liquid is injected into your uterus through your cervix (sonohysterography) or by using a thin, flexible tube with a tiny light and camera on the end of it (hysteroscope).  ¨ The donor's sperm will be taken and analyzed to see if they are normal, there are enough present to fertilize the egg, and they act normally after sexual intercourse (postcoital exam).  PROCEDURE   IVF involves several steps. These procedures can be done at the health care provider's office or clinic. One cycle of IVF can take about 2 weeks, and more than one cycle may be required. The steps of IVF are:  · Ovarian stimulation. If using your own eggs during IVF, at the start of a cycle you will begin treatment with man-made (synthetic) hormones.   This stimulates your ovaries to produce multiple eggs, rather than the single egg that normally develops each month. Multiple eggs are needed because some eggs will not fertilize or develop normally after fertilization.    · Egg retrieval. Using ultrasound images as a guide, your health care provider will insert a thin needle through your vagina and into the ovary and sacs (follicles) containing the eggs. The needle is connected to a suction device, which pulls the eggs and fluid out of each follicle, one at a time. The procedure is repeated for the other ovary.  · Insemination and  fertilization. The sperm is mixed together with your eggs (insemination) and stored in an environmentally controlled chamber. The sperm usually enters (fertilizes) an egg a few hours after insemination.  · Embryo transfer. Your health care provider will place the embryos into your uterus using a thin tube (catheter) containing the embryos. The catheter is inserted into your vagina, through your cervix, and into your uterus. The embryo transfer usually takes place 2-6 days after the egg retrieval. If successful, the embryo will stick to (implant) in the lining of your uterus about 6-10 days after egg retrieval. If an embryo implants in the lining of the uterus and grows, pregnancy results.  AFTER THE PROCEDURE   · You may have to continue lying down for an hour or more before going home.  · You may need to continue to take hormone therapy for about 3 months or as directed by your health care provider.  · You may resume your normal diet and activities.     This information is not intended to replace advice given to you by your health care provider. Make sure you discuss any questions you have with your health care provider.     Document Released: 03/18/2008 Document Revised: 12/06/2012 Document Reviewed: 10/12/2012  Elsevier Interactive Patient Education ©2016 [employer]

## 2015-05-03 LAB — URINALYSIS W MICROSCOPIC + REFLEX CULTURE
BACTERIA UA: NONE SEEN [HPF]
Bilirubin Urine: NEGATIVE
CASTS: NONE SEEN [LPF]
Crystals: NONE SEEN [HPF]
GLUCOSE, UA: NEGATIVE
HGB URINE DIPSTICK: NEGATIVE
KETONES UR: NEGATIVE
LEUKOCYTES UA: NEGATIVE
NITRITE: NEGATIVE
PH: 6.5 (ref 5.0–8.0)
PROTEIN: NEGATIVE
Specific Gravity, Urine: 1.023 (ref 1.001–1.035)
WBC, UA: NONE SEEN WBC/HPF (ref <=5)
YEAST: NONE SEEN [HPF]

## 2015-05-03 LAB — FOLLICLE STIMULATING HORMONE: FSH: 7.2 m[IU]/mL

## 2015-05-03 LAB — TSH: TSH: 1.572 u[IU]/mL (ref 0.350–4.500)

## 2015-05-03 LAB — PROLACTIN: Prolactin: 5.1 ng/mL

## 2015-05-06 LAB — URINE CULTURE: Colony Count: 30000

## 2015-05-06 LAB — CYTOLOGY - PAP

## 2015-05-21 ENCOUNTER — Other Ambulatory Visit: Payer: Self-pay | Admitting: Gynecology

## 2015-05-21 DIAGNOSIS — R7989 Other specified abnormal findings of blood chemistry: Secondary | ICD-10-CM

## 2015-05-21 DIAGNOSIS — D72829 Elevated white blood cell count, unspecified: Secondary | ICD-10-CM

## 2015-05-21 MED ORDER — AMPICILLIN 250 MG PO CAPS: 250.0000 mg | ORAL_CAPSULE | Freq: Two times a day (BID) | ORAL | Status: DC

## 2015-05-22 ENCOUNTER — Other Ambulatory Visit: Payer: Commercial Managed Care - HMO

## 2015-05-22 DIAGNOSIS — R7989 Other specified abnormal findings of blood chemistry: Secondary | ICD-10-CM

## 2015-05-22 DIAGNOSIS — D72829 Elevated white blood cell count, unspecified: Secondary | ICD-10-CM

## 2015-05-22 LAB — CBC WITH DIFFERENTIAL/PLATELET
BASOS ABS: 0 10*3/uL (ref 0.0–0.1)
Basophils Relative: 0 % (ref 0–1)
EOS ABS: 0.1 10*3/uL (ref 0.0–0.7)
EOS PCT: 1 % (ref 0–5)
HEMATOCRIT: 39.8 % (ref 36.0–46.0)
Hemoglobin: 13.3 g/dL (ref 12.0–15.0)
LYMPHS ABS: 5.1 10*3/uL — AB (ref 0.7–4.0)
LYMPHS PCT: 36 % (ref 12–46)
MCH: 28.5 pg (ref 26.0–34.0)
MCHC: 33.4 g/dL (ref 30.0–36.0)
MCV: 85.4 fL (ref 78.0–100.0)
MONO ABS: 1.1 10*3/uL — AB (ref 0.1–1.0)
MPV: 10.3 fL (ref 8.6–12.4)
Monocytes Relative: 8 % (ref 3–12)
Neutro Abs: 7.8 10*3/uL — ABNORMAL HIGH (ref 1.7–7.7)
Neutrophils Relative %: 55 % (ref 43–77)
PLATELETS: 404 10*3/uL — AB (ref 150–400)
RBC: 4.66 MIL/uL (ref 3.87–5.11)
RDW: 15.2 % (ref 11.5–15.5)
WBC: 14.1 10*3/uL — AB (ref 4.0–10.5)

## 2015-05-27 ENCOUNTER — Ambulatory Visit (INDEPENDENT_AMBULATORY_CARE_PROVIDER_SITE_OTHER): Payer: Commercial Managed Care - HMO | Admitting: Family Medicine

## 2015-05-27 ENCOUNTER — Encounter: Payer: Self-pay | Admitting: Family Medicine

## 2015-05-27 VITALS — BP 146/82 | HR 72 | Temp 98.1°F | Ht 67.0 in | Wt 355.0 lb

## 2015-05-27 DIAGNOSIS — D72829 Elevated white blood cell count, unspecified: Secondary | ICD-10-CM

## 2015-05-27 NOTE — Patient Instructions (Signed)
Return in 1 week to check labs Stop ampicillin Use hydrocortisone cream on your neck  Be well, Dr. Pollie Meyer

## 2015-05-30 DIAGNOSIS — D72829 Elevated white blood cell count, unspecified: Secondary | ICD-10-CM | POA: Insufficient documentation

## 2015-05-30 NOTE — Progress Notes (Signed)
Date of Visit: 05/27/2015   HPI:  Patient presents for a same day appointment to discuss elevated WBC count.  Was seen at her GYN office on 1/13 and had routine labwork drawn. WBC was noted to be elevated at that time to 12.5. Also had urine culture obtained despite no symptoms and normal urinalysis. Urine culture grew 30k colonies of proteus mirablis, sensitive to ampicillin. Started on ampicillin 229m twice daily for 7 days. Patient then had repeat CBC drawn on 2/2 with WBC of 14.1. Was instructed to follow up with her PCP for persistently elevated wbc count.  Of note, the night that patient began taking the ampicillin, reports developing rash on the back of her neck. Quite pruritic. Has not spread elsewhere. No sores in mouth or genitals. No fevers. Tolerating PO. No vomiting or pain anywhere. Has continued to take ampicillin and rash has also therefore continued. Denies any history of penicillin allergy in the past.   Does report a family history of multiple myeloma in her father. He died from this two years ago. Also has family history of breast cancer.  ROS: See HPI  PCabazon history of onychomycosis, obesity, sickle cell trait  PHYSICAL EXAM: BP 146/82 mmHg  Pulse 72  Temp(Src) 98.1 F (36.7 C) (Oral)  Ht _0  (1.702 m)  Wt 355 lb (161.027 kg)  BMI 55.59 kg/m2  LMP 04/12/2015 Gen: NAD, pleasant, cooperative, well appearing HEENT: normocephalic, atraumatic. oropharynx clear and moist. No anterior cervical or supraclavicular lymphadenopathy.  Heart: regular rate and rhythm, no murmur Lungs: clear to auscultation bilaterally, normal work of breathing  Abdomen: soft, nontender to palpation, no masses Neuro: alert, grossly nonfocal, speech normal Skin: erythematous maculopapular rash on back of neck. No skin breakdown or weeping.  ASSESSMENT/PLAN:  Leukocytosis Etiology unclear. No findings on history to suggest obvious cause. Patient was treated for bacteriuria, but as she had  no symptoms and a negative urinalysis, this would classify as asymptomatic bacteriuria and would not warrant treatment as UTI, so I doubt this to be the cause of leukocytosis. As she had reaction to the ampicillin, will stop that antibiotic now. Recommend topical hydrocortisone cream as needed for itching. Will repeat CBC with diff and obtain additional labwork (CMET, CRP, sed rate, peripheral smear). Given medication reaction, asked patient to return in 1 week to have these labs drawn as she may have systemic inflammation just due to the reaction alone. Patient agreeable to this plan.    FOLLOW UP: Follow up in 1 week for lab visit  BTanzaniaJ. MArdelia Mems MTieton

## 2015-05-30 NOTE — Assessment & Plan Note (Signed)
Etiology unclear. No findings on history to suggest obvious cause. Patient was treated for bacteriuria, but as she had no symptoms and a negative urinalysis, this would classify as asymptomatic bacteriuria and would not warrant treatment as UTI, so I doubt this to be the cause of leukocytosis. As she had reaction to the ampicillin, will stop that antibiotic now. Recommend topical hydrocortisone cream as needed for itching. Will repeat CBC with diff and obtain additional labwork (CMET, CRP, sed rate, peripheral smear). Given medication reaction, asked patient to return in 1 week to have these labs drawn as she may have systemic inflammation just due to the reaction alone. Patient agreeable to this plan.

## 2015-12-02 ENCOUNTER — Encounter: Payer: Self-pay | Admitting: Family Medicine

## 2015-12-02 ENCOUNTER — Ambulatory Visit (INDEPENDENT_AMBULATORY_CARE_PROVIDER_SITE_OTHER): Payer: Commercial Managed Care - HMO | Admitting: Family Medicine

## 2015-12-02 VITALS — BP 144/82 | HR 76 | Temp 97.6°F | Ht 67.0 in | Wt 360.6 lb

## 2015-12-02 DIAGNOSIS — H4710 Unspecified papilledema: Secondary | ICD-10-CM | POA: Diagnosis not present

## 2015-12-02 DIAGNOSIS — B351 Tinea unguium: Secondary | ICD-10-CM | POA: Diagnosis not present

## 2015-12-02 DIAGNOSIS — M79676 Pain in unspecified toe(s): Secondary | ICD-10-CM | POA: Diagnosis not present

## 2015-12-02 DIAGNOSIS — D72829 Elevated white blood cell count, unspecified: Secondary | ICD-10-CM | POA: Diagnosis not present

## 2015-12-02 LAB — POCT SEDIMENTATION RATE: POCT SED RATE: 22 mm/hr (ref 0–22)

## 2015-12-02 LAB — POCT GLYCOSYLATED HEMOGLOBIN (HGB A1C): Hemoglobin A1C: 5.7

## 2015-12-02 NOTE — Patient Instructions (Addendum)
Checking lots of labs & MRI Schedule appointment with me in a few weeks to discuss further  Get the eye doctor to send [place] records  Referring to eye physician and podiatry  Be well, Dr. Pollie Meyer

## 2015-12-02 NOTE — Progress Notes (Signed)
Date of Visit: 12/02/2015   HPI:  Patient presents today to discuss several items. Unfortunately she arrived over 20 minutes late for the visit, so we had to be very efficient with our time.  - saw her eye doctor on Tuesday for a routine eye exam. 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 Has appointment scheduled, but not until October 12. The eye doctor was an optometrist at [facility] on [place]. Has not had any recent imaging of her head. Endorses episodic vertigo. Also has migraines on the Rside. Occasionally sees sparkles of light, or her eye goes black. Wants to be evaluated for multiple sclerosis, as she has tingling/cramping in her feet that makes it very hard for her to get through her day.   - podiatry referral - was never contacted about podiatry referral for her painful toenails. Would like this done.  - lymphocytosis - I had seen her for this previously, and she had been instructed to follow up for labs, though never returned for lab appointment.   ROS: See HPI.  PMFSH: history of obesity, tobacco abuse, sickle cell trait  PHYSICAL EXAM: BP (!) 144/82   Pulse 76   Temp 97.6 F (36.4 C) (Oral)   Ht 5\' 7"  (1.702 m)   Wt (!) 360 lb 9.6 oz (163.6 kg)   BMI 56.48 kg/m  Gen: NAD, pleasant, cooperative HEENT: normocephalic, atraumatic, moist mucous membranes, pupils equal round and reactive to light  Heart: regular rate and rhythm, no murmur Lungs: clear to auscultation bilaterally, normal work of breathing  Neuro: cranial nerves II-XII tested and intact. Speech normal. Full strength bilat upper and lower ext. Normal FNF.  Ext: No appreciable lower extremity edema bilaterally. Toenails thickened and angulated (tenting in the middle). No erythema or drainage.  ASSESSMENT/PLAN:  Leukocytosis Recheck labs to see current state: sed rate, CPR, CMET, CBC diff, peripheral smear, A1c Follow up closely with me in 2 weeks to review workup & discuss  further.  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 Patient does endorse ophthalmologic symptoms. Normal neurological exam today. Blood pressure not markedly elevated. Will have patient get optometrist to send [place] records, as we have no records of this encounter. Checking labs: sed rate, CPR, CMET, CBC diff, peripheral smear, A1c Also schedule MRI brain. Refer to ophthalmology. Already has appointment with neurology.  Follow up closely with me in 2 weeks to review workup & discuss further.  Toenail pain Referral entered to podiatry  FOLLOW UP: Follow up in 2-3 weeks with me for above issues Referring to ophtho Referring to podiatry  [name], MD [facility]

## 2015-12-03 LAB — CBC WITH DIFFERENTIAL/PLATELET
BASOS ABS: 0 {cells}/uL (ref 0–200)
Basophils Relative: 0 %
EOS ABS: 360 {cells}/uL (ref 15–500)
Eosinophils Relative: 3 %
HEMATOCRIT: 40.9 % (ref 35.0–45.0)
HEMOGLOBIN: 13.7 g/dL (ref 11.7–15.5)
Lymphs Abs: 4080 cells/uL — ABNORMAL HIGH (ref 850–3900)
MCH: 27.9 pg (ref 27.0–33.0)
MCHC: 33.5 g/dL (ref 32.0–36.0)
MCV: 83.3 fL (ref 80.0–100.0)
MPV: 10.9 fL (ref 7.5–12.5)
Monocytes Absolute: 1080 cells/uL — ABNORMAL HIGH (ref 200–950)
Monocytes Relative: 9 %
Neutro Abs: 6480 cells/uL (ref 1500–7800)
Neutrophils Relative %: 54 %
Platelets: 385 10*3/uL (ref 140–400)
RBC: 4.91 MIL/uL (ref 3.80–5.10)
RDW: 15.8 % — ABNORMAL HIGH (ref 11.0–15.0)
WBC: 12 10*3/uL — AB (ref 3.8–10.8)

## 2015-12-03 LAB — COMPREHENSIVE METABOLIC PANEL
ALK PHOS: 66 U/L (ref 33–115)
ALT: 25 U/L (ref 6–29)
AST: 20 U/L (ref 10–30)
Albumin: 3.8 g/dL (ref 3.6–5.1)
BILIRUBIN TOTAL: 0.5 mg/dL (ref 0.2–1.2)
BUN: 9 mg/dL (ref 7–25)
CALCIUM: 9.4 mg/dL (ref 8.6–10.2)
CO2: 23 mmol/L (ref 20–31)
Chloride: 104 mmol/L (ref 98–110)
Creat: 0.74 mg/dL (ref 0.50–1.10)
Glucose, Bld: 89 mg/dL (ref 65–99)
Potassium: 4.1 mmol/L (ref 3.5–5.3)
Sodium: 140 mmol/L (ref 135–146)
Total Protein: 7 g/dL (ref 6.1–8.1)

## 2015-12-03 LAB — C-REACTIVE PROTEIN: CRP: 1.1 mg/dL — ABNORMAL HIGH (ref <0.60)

## 2015-12-03 LAB — PATHOLOGIST SMEAR REVIEW

## 2015-12-08 DIAGNOSIS — H4710 Unspecified papilledema: Secondary | ICD-10-CM | POA: Insufficient documentation

## 2015-12-08 NOTE — Assessment & Plan Note (Addendum)
Reported by optometrist during routine eye exam. Patient does endorse ophthalmologic symptoms. Normal neurological exam today. Blood pressure not markedly elevated. Will have patient get optometrist to send [place] records, as we have no records of this encounter. Checking labs: sed rate, CPR, CMET, CBC diff, peripheral smear, A1c Also schedule MRI brain. Refer to ophthalmology. Already has appointment with neurology.  Follow up closely with me in 2 weeks to review workup & discuss further.

## 2015-12-08 NOTE — Assessment & Plan Note (Signed)
Recheck labs to see current state: sed rate, CPR, CMET, CBC diff, peripheral smear, A1c Follow up closely with me in 2 weeks to review workup & discuss further.

## 2015-12-09 ENCOUNTER — Ambulatory Visit (HOSPITAL_COMMUNITY)
Admission: RE | Admit: 2015-12-09 | Discharge: 2015-12-09 | Disposition: A | Discharge status: Home / Self Care | Payer: Commercial Managed Care - HMO | Source: Ambulatory Visit | Attending: Family Medicine | Admitting: Family Medicine

## 2015-12-09 DIAGNOSIS — H4710 Unspecified papilledema: Secondary | ICD-10-CM | POA: Diagnosis present

## 2015-12-09 MED ORDER — GADOBENATE DIMEGLUMINE 529 MG/ML IV SOLN
20.0000 mL | Freq: Once | INTRAVENOUS | Status: AC
  Administered 2015-12-09: 20 mL via INTRAVENOUS

## 2015-12-15 ENCOUNTER — Telehealth: Payer: Self-pay | Admitting: Family Medicine

## 2015-12-15 DIAGNOSIS — D72829 Elevated white blood cell count, unspecified: Secondary | ICD-10-CM

## 2015-12-15 NOTE — Telephone Encounter (Signed)
Called patient to discuss results Labs with persistent leukocytosis. Unclear etiology. Will refer to heme/onc for them to weigh in. MRI normal Has upcoming appointment with eye doctor Patient appreciative.  Angelica Dodrill, MD

## 2015-12-17 ENCOUNTER — Encounter: Payer: Self-pay | Admitting: Hematology

## 2015-12-23 ENCOUNTER — Ambulatory Visit: Payer: Commercial Managed Care - HMO | Admitting: Podiatry

## 2015-12-31 ENCOUNTER — Ambulatory Visit: Payer: Commercial Managed Care - HMO | Admitting: Neurology

## 2016-01-01 ENCOUNTER — Encounter: Payer: Self-pay | Admitting: Podiatry

## 2016-01-01 ENCOUNTER — Ambulatory Visit (INDEPENDENT_AMBULATORY_CARE_PROVIDER_SITE_OTHER): Payer: Commercial Managed Care - HMO | Admitting: Podiatry

## 2016-01-01 VITALS — BP 141/88 | HR 67 | Resp 16

## 2016-01-01 DIAGNOSIS — L600 Ingrowing nail: Secondary | ICD-10-CM | POA: Diagnosis not present

## 2016-01-01 DIAGNOSIS — L603 Nail dystrophy: Secondary | ICD-10-CM | POA: Diagnosis not present

## 2016-01-01 MED ORDER — NEOMYCIN-POLYMYXIN-HC 1 % OT SOLN: OTIC | 1 refills | Status: DC

## 2016-01-01 NOTE — Progress Notes (Signed)
   Subjective:    Patient ID: Angelica Brown, female    DOB: 08-08-1975, 40 y.o.   MRN: [id number]  HPI: She presents today with a chief complaint of thick painful toenails that seemed to be ingrowing. She states they are so sharply turned in that they are painful for even the sheets to touch her toes at nighttime. She states that she has been tested for fungus before which came back positive and she had 2 rounds of Lamisil medication orally which did not help her at all. She states that she would like to have the toenails removed and she knows that more than likely many of the others will have to be removed as well. 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    Review of Systems  Constitutional: Positive for fatigue.  HENT: Positive for sinus pressure.   Eyes: Positive for visual disturbance.  Cardiovascular: Positive for palpitations.  Skin: Positive for rash.  Neurological: Positive for dizziness.  All other systems reviewed and are negative.      Objective:   Physical Exam: Vital signs are stable alert and oriented 3. Pulses are palpable. Neurologic sensorium is intact per Semmes-Weinstein monofilament. Deep tendon reflexes are intact. Muscle strength is 5 over 5 dorsiflexion plantar flexors and inverters everters on his musculature is intact. Orthopedic evaluation demonstrates all joints distal to the ankle for range of motion without crepitation. Cutaneous evaluation Mr. supple well-hydrated cutis her toenails have sharp incurvated nail margins and grossly greater kyphotic onychocryptosis with severe pain on palpation.  Assessment: Ingrown nail paronychia onychocryptosis    Assessment & Plan:  Assessment: Ingrown nail paronychia onychocryptosis hallux bilateral.  Plan: Total nail avulsion was performed today after local anesthesia was achieved. Chemical matrixectomy was performed bilaterally. Both oral and home-going  instructions were provided to the patient for soaking twice daily. She was also provided with a prescription for Cortisporin Otic be applied twice daily after soaking. We will follow-up with her in 1-2 weeks by sure she is doing well.

## 2016-01-01 NOTE — Patient Instructions (Signed)

## 2016-01-02 ENCOUNTER — Ambulatory Visit: Payer: Commercial Managed Care - HMO | Admitting: Neurology

## 2016-01-02 ENCOUNTER — Telehealth: Payer: Self-pay | Admitting: Neurology

## 2016-01-02 NOTE — Telephone Encounter (Signed)
Angelica Brown 03-26-1976. She was late for her appointment today and she has no showed or canceled another one prior to today's appointment. We did reschedule the appointment.  I did call her back once she had left the office  and explained why we could not reschedule her and she asked to speak with him or someone to explain the situation. Her # is [phone]. Thank you

## 2016-01-02 NOTE — Telephone Encounter (Signed)
Message relayed to patient. Verbalized understanding and denied questions.   

## 2016-01-08 ENCOUNTER — Ambulatory Visit: Payer: Commercial Managed Care - HMO | Admitting: Hematology

## 2016-01-14 ENCOUNTER — Ambulatory Visit: Payer: Commercial Managed Care - HMO | Admitting: Podiatry

## 2016-01-19 ENCOUNTER — Ambulatory Visit: Payer: Commercial Managed Care - HMO | Admitting: Podiatry

## 2016-01-26 ENCOUNTER — Ambulatory Visit (INDEPENDENT_AMBULATORY_CARE_PROVIDER_SITE_OTHER): Payer: Commercial Managed Care - HMO | Admitting: Podiatry

## 2016-01-26 DIAGNOSIS — L89891 Pressure ulcer of other site, stage 1: Secondary | ICD-10-CM | POA: Diagnosis not present

## 2016-01-26 DIAGNOSIS — S91109D Unspecified open wound of unspecified toe(s) without damage to nail, subsequent encounter: Secondary | ICD-10-CM

## 2016-01-26 DIAGNOSIS — M79676 Pain in unspecified toe(s): Secondary | ICD-10-CM

## 2016-01-26 DIAGNOSIS — L03039 Cellulitis of unspecified toe: Secondary | ICD-10-CM

## 2016-01-26 MED ORDER — GENTAMICIN SULFATE 0.1 % EX CREA: 1.0000 "application " | TOPICAL_CREAM | Freq: Three times a day (TID) | CUTANEOUS | 1 refills | Status: DC

## 2016-01-26 NOTE — Progress Notes (Signed)
Subjective: Patient presents today 2 weeks post ingrown nail permanent nail avulsion procedure. Patient states that the toe and nail fold is feeling much better.  Objective: Skin is warm, dry and supple. Nail and respective nail fold appears to be healing appropriately. Open wound to the associated nail fold with a granular wound base and moderate amount of fibrotic tissue. Minimal drainage noted. Mild erythema around the periungual region likely due to phenol chemical matricectomy.  Assessment: #1 postop permanent partial nail avulsion #2 open wound periungual nail fold of respective digit.   Plan of care: #1 patient was evaluated  #2 debridement of open wound was performed to the periungual border of the respective toe using a currette. Antibiotic ointment and Band-Aid was applied. #3 patient is to return to clinic on a PRN  Basis. #4 today prescription for gentamicin cream was dispensed #5 patient is return to clinic for total nail avulsion of second digits bilaterally

## 2016-01-29 ENCOUNTER — Ambulatory Visit: Payer: Commercial Managed Care - HMO | Admitting: Neurology

## 2016-02-07 ENCOUNTER — Encounter (HOSPITAL_COMMUNITY): Payer: Self-pay | Admitting: *Deleted

## 2016-02-07 ENCOUNTER — Ambulatory Visit (HOSPITAL_COMMUNITY)
Admission: EM | Admit: 2016-02-07 | Discharge: 2016-02-07 | Disposition: A | Discharge status: Home / Self Care | Payer: Commercial Managed Care - HMO | Attending: Family Medicine | Admitting: Family Medicine

## 2016-02-07 DIAGNOSIS — H6982 Other specified disorders of Eustachian tube, left ear: Secondary | ICD-10-CM

## 2016-02-07 MED ORDER — PREDNISONE 10 MG (21) PO TBPK: ORAL_TABLET | ORAL | 0 refills | Status: DC

## 2016-02-07 NOTE — Discharge Instructions (Signed)
Start Prednisone daily as directed. May use Sudafed 60mg  every 8 hours as needed for congestion. Continue Flonase and Zyrtec as directed. Follow-up with your primary care provider in 3 days if not improving. May need to see an ENT specialist if symptoms persist.

## 2016-02-07 NOTE — ED Provider Notes (Signed)
CSN: [id number]     Arrival date & time 02/07/16  1837 History   First MD Initiated Contact with Patient 02/07/16 2000     Chief Complaint  Patient presents with  . Ear Pain   (Consider location/radiation/quality/duration/timing/severity/associated sxs/prior Treatment) 40 year old female presents with left ear discomfort that started 3 days ago. Had gone to ER about 3 weeks ago with concern over right ear discomfort and fluid. Diagnosed with mild fluid in right ear and left ear infection. Was given antibiotic (uncertain of name but thinks it was Amoxicillin and took it without reaction even though she is allergic to PCN)  which has helped the right ear but now left ear is hurting more. Denies any fever, congestion or cough now. Has been taking Zyrtec and Flonase daily with minimal relief.    The history is provided by the patient.    Past Medical History:  Diagnosis Date  . CHOLELITHIASIS   . Dermatophytosis of nail   . Heartburn   . Hypertension   . Metrorrhagia   . OBESITY, MORBID   . OBESITY, NOS   . POSTURAL LIGHTHEADEDNESS   . Sickle cell trait (HCC)   . SYNCOPE   . Throat pain   . TOBACCO DEPENDENCE   . URI   . Urinary frequency    Past Surgical History:  Procedure Laterality Date  . BARTHOLIN GLAND CYST EXCISION  2004  . TUBAL LIGATION  2007  . WISDOM TOOTH EXTRACTION  1998   Family History  Problem Relation Age of Onset  . Breast cancer Mother   . Cancer Father     MELANOMA  . Diabetes Father   . Diabetes Paternal Grandmother   . Diabetes Paternal Grandfather    Social History  Substance Use Topics  . Smoking status: Current Every Day Smoker    Years: 10.00    Types: Cigarettes  . Smokeless tobacco: Never Used  . Alcohol use No   OB History    Gravida Para Term Preterm AB Living   5 5 4 1   4    SAB TAB Ectopic Multiple Live Births         1       Review of Systems  Constitutional: Negative for chills, fatigue and fever.  HENT: Positive for ear  pain, hearing loss and postnasal drip. Negative for congestion, ear discharge, rhinorrhea, sinus pressure, sneezing and sore throat.   Respiratory: Negative for cough, chest tightness, shortness of breath and wheezing.   Cardiovascular: Negative for chest pain.  Gastrointestinal: Negative for abdominal pain, diarrhea, nausea and vomiting.  Musculoskeletal: Negative for neck pain and neck stiffness.  Neurological: Negative for dizziness, weakness, light-headedness, numbness and headaches.    Allergies  Strawberry extract and Ampicillin  Home Medications   Prior to Admission medications   Medication Sig Start Date End Date Taking? Authorizing Provider  predniSONE (STERAPRED UNI-PAK 21 TAB) 10 MG (21) TBPK tablet Take 6 tabs by mouth 1st day then decrease by 1 tablet each day until finished. 02/07/16   Sudie GrumblingAnn Berry Nelvin Tomb, NP   Meds Ordered and Administered this Visit  Medications - No data to display  BP 130/69 (BP Location: Left Wrist)   Pulse 81   Temp 99 F (37.2 C) (Oral)   Resp 18   LMP 12/31/2015 (Exact Date) Comment: States very irregular and is perimenopausal  SpO2 99%  No data found.   Physical Exam  Constitutional: She is oriented to person, place, and time. She appears  well-developed and well-nourished. No distress.  HENT:  Head: Normocephalic and atraumatic.  Right Ear: Hearing, tympanic membrane, external ear and ear canal normal. Tympanic membrane is not injected and not erythematous.  Left Ear: Hearing, external ear and ear canal normal. No drainage. Tympanic membrane is bulging. Tympanic membrane is not injected and not erythematous. A middle ear effusion is present.  Nose: Nose normal. Right sinus exhibits no maxillary sinus tenderness and no frontal sinus tenderness. Left sinus exhibits no maxillary sinus tenderness and no frontal sinus tenderness.  Mouth/Throat: Uvula is midline, oropharynx is clear and moist and mucous membranes are normal.  Neck: Normal range of  motion. Neck supple.  Cardiovascular: Normal rate, regular rhythm and normal heart sounds.   Pulmonary/Chest: Effort normal and breath sounds normal. She has no wheezes. She has no rales.  Lymphadenopathy:    She has no cervical adenopathy.  Neurological: She is alert and oriented to person, place, and time.  Skin: Skin is warm and dry. Capillary refill takes less than 2 seconds.  Psychiatric: She has a normal mood and affect. Her behavior is normal. Judgment and thought content normal.    Urgent Care Course   Clinical Course    Procedures (including critical care time)  Labs Review Labs Reviewed - No data to display  Imaging Review No results found.   Visual Acuity Review  Right Eye Distance:   Left Eye Distance:   Bilateral Distance:    Right Eye Near:   Left Eye Near:    Bilateral Near:         MDM   1. Dysfunction of left eustachian tube    Discussed with patient that she no longer has an ear infection. She does still have fluid present in left ear. Since she is already on Zyrtec and Flonase, recommend trial Prednisone 10mg  6 day dose pack as directed. May take Ibuprofen 600mg  every 8 hours as needed for pain. Recommend follow-up with her PCP if minimal improvement within 3 to 4 days or go to an ENT for further evaluation if symptoms persist.     Sudie Grumbling, NP 02/08/16 1107

## 2016-02-07 NOTE — ED Triage Notes (Signed)
Diagnosed with left ear infection (without sxs) after going to an ED for right hearing problems.  Finished abx (unk name).  Over past few days c/o significant left earache.  Has been taking Flonase, Tyl, and Zyrtec.

## 2016-02-16 ENCOUNTER — Ambulatory Visit: Payer: Commercial Managed Care - HMO | Admitting: Podiatry

## 2016-02-18 ENCOUNTER — Ambulatory Visit (INDEPENDENT_AMBULATORY_CARE_PROVIDER_SITE_OTHER): Payer: Commercial Managed Care - HMO | Admitting: Podiatry

## 2016-02-18 ENCOUNTER — Encounter: Payer: Self-pay | Admitting: Podiatry

## 2016-02-18 VITALS — BP 117/73 | HR 58 | Resp 16

## 2016-02-18 DIAGNOSIS — L03039 Cellulitis of unspecified toe: Secondary | ICD-10-CM

## 2016-02-18 DIAGNOSIS — M79676 Pain in unspecified toe(s): Secondary | ICD-10-CM

## 2016-02-18 DIAGNOSIS — S91109D Unspecified open wound of unspecified toe(s) without damage to nail, subsequent encounter: Secondary | ICD-10-CM | POA: Diagnosis not present

## 2016-02-18 MED ORDER — SULFAMETHOXAZOLE-TRIMETHOPRIM 800-160 MG PO TABS: 1.0000 | ORAL_TABLET | Freq: Two times a day (BID) | ORAL | 0 refills | Status: DC

## 2016-02-22 NOTE — Progress Notes (Signed)
Subjective: Patient presents today approximately 6 weeks post ingrown nail permanent nail avulsion procedure. Patient states that the toe and nail fold is feeling much better.  Objective: Skin is warm, dry and supple. Open wound to the associated nail fold with a granular wound base and moderate amount of fibrotic tissue. Minimal drainage noted. Mild erythema around the periungual region likely due to localized cellulitis   Assessment: #1 postop permanent partial nail avulsion #2 open wound periungual nail fold of respective digit.   Plan of care: #1 patient was evaluated  #2 prescription for Bactrim 2 weeks  #3 medically necessary debridement of open wound was performed using a curet and a tissue nipper.  #4 continue gentamicin cream #5 recommend application of Band-Aid daily #6 patient is to return to clinic in 2 weeks #7 today no for open toe shoes to be worn at work was given

## 2016-03-03 ENCOUNTER — Ambulatory Visit: Payer: Commercial Managed Care - HMO | Admitting: Neurology

## 2016-03-04 ENCOUNTER — Ambulatory Visit: Payer: Commercial Managed Care - HMO | Admitting: Podiatry

## 2016-03-17 ENCOUNTER — Ambulatory Visit: Payer: Commercial Managed Care - HMO | Admitting: Podiatry

## 2016-05-24 ENCOUNTER — Emergency Department (HOSPITAL_COMMUNITY)
Admission: EM | Admit: 2016-05-24 | Discharge: 2016-05-25 | Disposition: A | Discharge status: Home / Self Care | Payer: Commercial Managed Care - HMO | Source: Home / Self Care | Attending: Emergency Medicine | Admitting: Emergency Medicine

## 2016-05-24 ENCOUNTER — Encounter (HOSPITAL_COMMUNITY): Payer: Self-pay | Admitting: Emergency Medicine

## 2016-05-24 DIAGNOSIS — R519 Headache, unspecified: Secondary | ICD-10-CM

## 2016-05-24 DIAGNOSIS — Z7982 Long term (current) use of aspirin: Secondary | ICD-10-CM | POA: Insufficient documentation

## 2016-05-24 DIAGNOSIS — G08 Intracranial and intraspinal phlebitis and thrombophlebitis: Secondary | ICD-10-CM | POA: Diagnosis not present

## 2016-05-24 DIAGNOSIS — I10 Essential (primary) hypertension: Secondary | ICD-10-CM | POA: Insufficient documentation

## 2016-05-24 DIAGNOSIS — F17210 Nicotine dependence, cigarettes, uncomplicated: Secondary | ICD-10-CM | POA: Insufficient documentation

## 2016-05-24 DIAGNOSIS — R51 Headache: Principal | ICD-10-CM

## 2016-05-24 MED ORDER — PROCHLORPERAZINE EDISYLATE 5 MG/ML IJ SOLN
10.0000 mg | Freq: Once | INTRAMUSCULAR | Status: AC
  Administered 2016-05-24: 10 mg via INTRAVENOUS
  Filled 2016-05-24: qty 2

## 2016-05-24 MED ORDER — KETOROLAC TROMETHAMINE 30 MG/ML IJ SOLN
30.0000 mg | Freq: Once | INTRAMUSCULAR | Status: AC
Start: 2016-05-24 — End: 2016-05-24
  Administered 2016-05-24: 30 mg via INTRAVENOUS
  Filled 2016-05-24: qty 1

## 2016-05-24 MED ORDER — OXYCODONE-ACETAMINOPHEN 5-325 MG PO TABS
1.0000 | ORAL_TABLET | ORAL | Status: DC | PRN
  Administered 2016-05-24: 1 via ORAL
  Filled 2016-05-24: qty 1

## 2016-05-24 MED ORDER — DIAZEPAM 5 MG PO TABS
5.0000 mg | ORAL_TABLET | Freq: Once | ORAL | Status: AC
  Administered 2016-05-24: 5 mg via ORAL
  Filled 2016-05-24: qty 1

## 2016-05-24 MED ORDER — SODIUM CHLORIDE 0.9 % IV BOLUS (SEPSIS)
1000.0000 mL | Freq: Once | INTRAVENOUS | Status: AC
  Administered 2016-05-24: 1000 mL via INTRAVENOUS

## 2016-05-24 MED ORDER — DIPHENHYDRAMINE HCL 50 MG/ML IJ SOLN
25.0000 mg | Freq: Once | INTRAMUSCULAR | Status: AC
  Administered 2016-05-24: 25 mg via INTRAVENOUS
  Filled 2016-05-24: qty 1

## 2016-05-24 NOTE — ED Provider Notes (Signed)
[facility]-EMERGENCY DEPT Provider Note   CSN: [id number] Arrival date & time: 05/24/16  1410   By signing my name below, I, Nelwyn Salisbury, attest that this documentation has been prepared under the direction and in the presence of non-physician practitioner, Trixie Dredge, PA-C. Electronically Signed: Nelwyn Salisbury, Scribe. 05/24/2016. 10:10 PM.   History   Chief Complaint Chief Complaint  Patient presents with  . Migraine   The history is provided by the patient. No language interpreter was used.    HPI Comments:  Angelica Brown is a 41 y.o. female with pmhx of HTN who presents to the Emergency Department complaining of gradual-onset, constant, worsening headache beginning 6 days ago. Pt describes her symptoms as if "her brain is coming through her skull". Her pain began initially over her right eye but has worsened to a diffuse pain throughout her head. She reports associated photophobia, blurry vision, epistaxis, vomiting, nausea and left sided neck pain. Pt was recently seen by [facility] and given a migraine cocktail, as well as a prescription for Zithromax, Mucinex, Ibuprofen, and Flonase. She has taken these medications, as well as Excedrin migraine, at home with no relief. Pt denies any fevers, cough or rhinorrhea. She also denies any recent trauma or changes in lifestyle. Pt has been diagnosed with migraines in the past but states that this episode has been particularly severe.   Past Medical History:  Diagnosis Date  . CHOLELITHIASIS   . Dermatophytosis of nail   . Heartburn   . Hypertension   . Metrorrhagia   . OBESITY, MORBID   . OBESITY, NOS   . POSTURAL LIGHTHEADEDNESS   . Sickle cell trait (HCC)   . SYNCOPE   . Throat pain   . TOBACCO DEPENDENCE   . URI   . Urinary frequency     Patient Active Problem List   Diagnosis Date Noted  . 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 Leukocytosis 05/30/2015  . Onychomycosis 04/16/2013  . Family history of breast cancer 01/17/2013  . Chest  pain 12/06/2011  . Sickle cell trait (HCC)   . OBESITY, MORBID 07/10/2008  . SYNCOPE 10/24/2006  . CHOLELITHIASIS 07/13/2006  . HEARTBURN 07/13/2006  . TOBACCO DEPENDENCE 06/16/2006  . METRORRHAGIA 06/16/2006    Past Surgical History:  Procedure Laterality Date  . BARTHOLIN GLAND CYST EXCISION  2004  . TUBAL LIGATION  2007  . WISDOM TOOTH EXTRACTION  1998    OB History    Gravida Para Term Preterm AB Living   5 5 4 1   4    SAB TAB Ectopic Multiple Live Births         1         Home Medications    Prior to Admission medications   Medication Sig Start Date End Date Taking? Authorizing Provider  aspirin-acetaminophen-caffeine (EXCEDRIN MIGRAINE) [phone] MG tablet Take 2 tablets by mouth every 6 (six) hours as needed for headache.   Yes Historical Provider, MD  fluticasone (FLONASE) 50 MCG/ACT nasal spray Place 1 spray into the nose daily.  01/16/16 01/15/17 Yes Historical Provider, MD  ibuprofen (ADVIL,MOTRIN) 800 MG tablet Take 800 mg by mouth every 6 (six) hours as needed for moderate pain.  01/08/16  Yes Historical Provider, MD  sulfamethoxazole-trimethoprim (BACTRIM DS,SEPTRA DS) 800-160 MG tablet Take 1 tablet by mouth 2 (two) times daily. Patient not taking: Reported on 05/24/2016 02/18/16   Felecia Shelling, DPM    Family History Family History  Problem Relation Age of Onset  . Breast cancer  Mother   . Cancer Father     MELANOMA  . Diabetes Father   . Diabetes Paternal Grandmother   . Diabetes Paternal Grandfather     Social History Social History  Substance Use Topics  . Smoking status: Current Every Day Smoker    Years: 10.00    Types: Cigarettes  . Smokeless tobacco: Never Used  . Alcohol use No     Allergies   Prednisone; Strawberry extract; and Ampicillin   Review of Systems Review of Systems  Constitutional: Negative for fever.  HENT: Positive for nosebleeds. Negative for rhinorrhea.   Eyes: Positive for photophobia and visual disturbance.    Respiratory: Negative for cough.   Gastrointestinal: Positive for nausea and vomiting.  Musculoskeletal: Positive for neck pain.  Neurological: Positive for dizziness.  All other systems reviewed and are negative.    Physical Exam Updated Vital Signs BP 134/96   Pulse (!) 58   Temp 98.5 F (36.9 C)   Resp 18   SpO2 94%   Physical Exam  Constitutional: She appears well-developed and well-nourished. No distress.  HENT:  Head: Normocephalic and atraumatic.  Mouth/Throat: Oropharynx is clear and moist. No oropharyngeal exudate.  Bilateral serous effusion. Right TM with some erythema. Canals normal.   Eyes: Conjunctivae are normal.  Neck: Normal range of motion. Neck supple.    Cardiovascular: Normal rate and regular rhythm.   Pulmonary/Chest: Effort normal and breath sounds normal. No respiratory distress. She has no wheezes. She has no rales.  Neurological: She is alert.  CN II-XII intact, EOMs intact, no pronator drift, grip strengths equal bilaterally; strength 5/5 in all extremities, sensation intact in all extremities; finger to nose, heel to shin, rapid alternating movements normal; gait is normal.     Skin: She is not diaphoretic.  Nursing note and vitals reviewed.    ED Treatments / Results  DIAGNOSTIC STUDIES:  Oxygen Saturation is 100% on RA, normal by my interpretation.    COORDINATION OF CARE:  10:19 PM Discussed treatment plan with pt at bedside which includes pain medication and imaging and pt agreed to plan.  12:47 AM Pt states that her initial 20/10 pain is now a 2/10 after medications.   Labs (all labs ordered are listed, but only abnormal results are displayed) Labs Reviewed - No data to display  EKG  EKG Interpretation None       Radiology No results found.  Procedures Procedures (including critical care time)  Medications Ordered in ED Medications  oxyCODONE-acetaminophen (PERCOCET/ROXICET) 5-325 MG per tablet 1 tablet (1 tablet  Oral Given 05/24/16 1725)  sodium chloride 0.9 % bolus 1,000 mL (0 mLs Intravenous Stopped 05/25/16 0036)  diphenhydrAMINE (BENADRYL) injection 25 mg (25 mg Intravenous Given 05/24/16 2259)  prochlorperazine (COMPAZINE) injection 10 mg (10 mg Intravenous Given 05/24/16 2259)  ketorolac (TORADOL) 30 MG/ML injection 30 mg (30 mg Intravenous Given 05/24/16 2259)  diazepam (VALIUM) tablet 5 mg (5 mg Oral Given 05/24/16 2259)     Initial Impression / Assessment and Plan / ED Course  I have reviewed the triage vital signs and the nursing notes.  Pertinent labs & imaging results that were available during my care of the patient were reviewed by me and considered in my medical decision making (see chart for details).     Afebrile, nontoxic patient with typical but persistent migraine headache.  No red flags including head trauma, fevers, meningeal signs, focal neurologic deficits.  Migraine cocktail given with relief.    D/C home  with PCP follow up.  Discussed result, findings, treatment, and follow up  with patient.  Pt given return precautions.  Pt verbalizes understanding and agrees with plan.     Final Clinical Impressions(s) / ED Diagnoses   Final diagnoses:  Bad headache    New Prescriptions New Prescriptions   No medications on file    I personally performed the services described in this documentation, which was scribed in my presence. The recorded information has been reviewed and is accurate.     [place], PA-C 05/25/16 7622    Azalia Bilis, MD 05/25/16 0200

## 2016-05-24 NOTE — ED Triage Notes (Signed)
Patient c/o migraine since Tuesday last week. Patient reports that she had nose bleed  But none since. Patient went to [facility] Thursday went to [facility] and was seen for migraine and fluid behind right ear. Patient reports migraine hasnt stopped and now she is nauseated and vomiting and has blurred vision.  Patient reports pain is across the front and on right side.

## 2016-05-25 ENCOUNTER — Ambulatory Visit: Payer: Commercial Managed Care - HMO | Admitting: Family Medicine

## 2016-05-25 NOTE — Discharge Instructions (Signed)
Read the information below.  You may return to the Emergency Department at any time for worsening condition or any new symptoms that concern you.  ° °You are having a headache. No specific cause was found today for your headache. It may have been a migraine or other cause of headache. Stress, anxiety, fatigue, and depression are common triggers for headaches. Your headache today does not appear to be life-threatening or require hospitalization, but often the exact cause of headaches is not determined in the emergency department. Therefore, follow-up with your doctor is very important to find out what may have caused your headache, and whether or not you need any further diagnostic testing or treatment. Sometimes headaches can appear benign (not harmful), but then more serious symptoms can develop which should prompt an immediate re-evaluation by your doctor or the emergency department. °SEEK MEDICAL ATTENTION IF: °You develop possible problems with medications prescribed.  °The medications don't resolve your headache, if it recurs , or if you have multiple episodes of vomiting or can't take fluids. °You have a change from the usual headache. °RETURN IMMEDIATELY IF you develop a sudden, severe headache or confusion, become poorly responsive or faint, develop a fever above 100.4F or problem breathing, have a change in speech, vision, swallowing, or understanding, or develop new weakness, numbness, tingling, incoordination, or have a seizure.  °

## 2016-05-26 ENCOUNTER — Encounter: Payer: Self-pay | Admitting: Family Medicine

## 2016-05-26 ENCOUNTER — Ambulatory Visit: Payer: Commercial Managed Care - HMO | Admitting: Family Medicine

## 2016-05-26 ENCOUNTER — Ambulatory Visit (INDEPENDENT_AMBULATORY_CARE_PROVIDER_SITE_OTHER): Payer: Commercial Managed Care - HMO | Admitting: Family Medicine

## 2016-05-26 DIAGNOSIS — H4710 Unspecified papilledema: Secondary | ICD-10-CM

## 2016-05-26 DIAGNOSIS — R519 Headache, unspecified: Secondary | ICD-10-CM

## 2016-05-26 DIAGNOSIS — R51 Headache: Secondary | ICD-10-CM

## 2016-05-26 MED ORDER — ONDANSETRON HCL 4 MG PO TABS: 4.0000 mg | ORAL_TABLET | Freq: Three times a day (TID) | ORAL | 1 refills | Status: DC | PRN

## 2016-05-26 MED ORDER — IBUPROFEN 800 MG PO TABS: 800.0000 mg | ORAL_TABLET | Freq: Four times a day (QID) | ORAL | 1 refills | Status: DC | PRN

## 2016-05-26 MED ORDER — CYCLOBENZAPRINE HCL 10 MG PO TABS: 10.0000 mg | ORAL_TABLET | Freq: Three times a day (TID) | ORAL | 1 refills | Status: DC | PRN

## 2016-05-26 NOTE — Progress Notes (Signed)
   Subjective:    Patient ID: Angelica Brown, female    DOB: [birth year], 41 y.o.   MRN: [id number]  HPI I will preface my note by saying this patient was dissatisfied with my visit.  "No one is listening to me.  There is something wrong in my head and you only want to give me pain meds."  With that caveat, 41 yo female with intractable headache x 8 days.  Intitially worked through the pain but left work early on 2/2 because of pain and nausea.  She did go to the ER evening of 2/1 and was given meds and told migraine.  She complains of nausea, nosebleed x 1, right eye fullness, neck tightness.  Also, seen in ER on 2/4 with same dx and no complaints.    She does have a history of migraines, which typically cause nausea and last one day.  This headache is substantially worse, lasting 8 days.  No focal neuro symptoms.  She had an optometry visit last Aug, which raised the concern for papiledema of Right eye.  She did see her PCP in FU.  An MRI was obtained and a neuro consult was ordered.  MRI of 12/09/15 was reviewed and officially read as normal.  At the time, the possible papiledema was an incidental finding not associated with headache.    Review of Systems     Objective:   Physical Exam  Very tender over temporalis muscles bilaterally, both occipital ridges, neck extensors and both trapezius.   EOMI, PERRLA. Left disc normal.  Rt disc poor view,  No papiledema.  Did seem to have deep cupping.   Neuro, CN 2-12 intact, Visual acuity Lt, Rt an both 20/20/ Motor and sensory grossly intact.         Assessment & Plan:

## 2016-05-26 NOTE — Patient Instructions (Addendum)
I am sorry you feel I am not helping.   I put in two referrals, one to an eye doctor and one to a neurologist.  Both are marked urgent. I did sent in three medications.  Pain, nausea and muscle relaxer.  The muscle relaxer is the one that can make you sleepy. I am sorry you were offended by my offer for a counselor.  I did so because you are obviously upset.  I do not think you are mentally ill.

## 2016-05-26 NOTE — Assessment & Plan Note (Signed)
I doubt related to headache.  Pseudo tumor cerebri unlikely given normal MRI.  Will get ophthalmology referral.

## 2016-05-26 NOTE — Assessment & Plan Note (Signed)
Patient became upset when I said a repeat MRI not indicated.  Asked to speak to clinic director (me.)  Demanded an MRI and told she would get a lawyer if I did not order.  Offered integrated care visit because she was obviously upset.  Became angrier: "I am not crazy.  There is something wrong in my head."  Intractable HA, now mixed migraine and muscle contraction is the most likely on differential. Will obtain neuro consult.  I believe she was calmer by the end of the visit and accepted my offer of medications.  She did apologize for cursing at me.

## 2016-05-27 ENCOUNTER — Emergency Department (HOSPITAL_COMMUNITY): Payer: Commercial Managed Care - HMO

## 2016-05-27 ENCOUNTER — Inpatient Hospital Stay (HOSPITAL_COMMUNITY)
Admission: EM | Admit: 2016-05-27 | Discharge: 2016-06-02 | DRG: 091 | Disposition: A | Discharge status: Home Health | Payer: Commercial Managed Care - HMO | Attending: Family Medicine | Admitting: Family Medicine

## 2016-05-27 ENCOUNTER — Encounter (HOSPITAL_COMMUNITY): Payer: Self-pay | Admitting: Emergency Medicine

## 2016-05-27 DIAGNOSIS — R51 Headache: Secondary | ICD-10-CM

## 2016-05-27 DIAGNOSIS — G932 Benign intracranial hypertension: Secondary | ICD-10-CM | POA: Diagnosis present

## 2016-05-27 DIAGNOSIS — R001 Bradycardia, unspecified: Secondary | ICD-10-CM

## 2016-05-27 DIAGNOSIS — Z86718 Personal history of other venous thrombosis and embolism: Secondary | ICD-10-CM

## 2016-05-27 DIAGNOSIS — G039 Meningitis, unspecified: Secondary | ICD-10-CM | POA: Diagnosis present

## 2016-05-27 DIAGNOSIS — R519 Headache, unspecified: Secondary | ICD-10-CM

## 2016-05-27 DIAGNOSIS — H7011 Chronic mastoiditis, right ear: Secondary | ICD-10-CM | POA: Diagnosis present

## 2016-05-27 DIAGNOSIS — H6691 Otitis media, unspecified, right ear: Secondary | ICD-10-CM | POA: Diagnosis present

## 2016-05-27 DIAGNOSIS — Z91018 Allergy to other foods: Secondary | ICD-10-CM

## 2016-05-27 DIAGNOSIS — Z6841 Body Mass Index (BMI) 40.0 and over, adult: Secondary | ICD-10-CM

## 2016-05-27 DIAGNOSIS — I5030 Unspecified diastolic (congestive) heart failure: Secondary | ICD-10-CM | POA: Diagnosis present

## 2016-05-27 DIAGNOSIS — Z79899 Other long term (current) drug therapy: Secondary | ICD-10-CM

## 2016-05-27 DIAGNOSIS — Z832 Family history of diseases of the blood and blood-forming organs and certain disorders involving the immune mechanism: Secondary | ICD-10-CM

## 2016-05-27 DIAGNOSIS — G43909 Migraine, unspecified, not intractable, without status migrainosus: Secondary | ICD-10-CM | POA: Diagnosis present

## 2016-05-27 DIAGNOSIS — Z803 Family history of malignant neoplasm of breast: Secondary | ICD-10-CM

## 2016-05-27 DIAGNOSIS — D573 Sickle-cell trait: Secondary | ICD-10-CM | POA: Diagnosis present

## 2016-05-27 DIAGNOSIS — G08 Intracranial and intraspinal phlebitis and thrombophlebitis: Principal | ICD-10-CM

## 2016-05-27 DIAGNOSIS — Z8632 Personal history of gestational diabetes: Secondary | ICD-10-CM

## 2016-05-27 DIAGNOSIS — H4710 Unspecified papilledema: Secondary | ICD-10-CM | POA: Diagnosis present

## 2016-05-27 DIAGNOSIS — Z880 Allergy status to penicillin: Secondary | ICD-10-CM

## 2016-05-27 DIAGNOSIS — K219 Gastro-esophageal reflux disease without esophagitis: Secondary | ICD-10-CM | POA: Diagnosis present

## 2016-05-27 DIAGNOSIS — Z888 Allergy status to other drugs, medicaments and biological substances status: Secondary | ICD-10-CM

## 2016-05-27 DIAGNOSIS — Z7982 Long term (current) use of aspirin: Secondary | ICD-10-CM

## 2016-05-27 DIAGNOSIS — I82C11 Acute embolism and thrombosis of right internal jugular vein: Secondary | ICD-10-CM | POA: Diagnosis present

## 2016-05-27 DIAGNOSIS — I82611 Acute embolism and thrombosis of superficial veins of right upper extremity: Secondary | ICD-10-CM

## 2016-05-27 DIAGNOSIS — Z808 Family history of malignant neoplasm of other organs or systems: Secondary | ICD-10-CM

## 2016-05-27 DIAGNOSIS — F17210 Nicotine dependence, cigarettes, uncomplicated: Secondary | ICD-10-CM | POA: Diagnosis present

## 2016-05-27 DIAGNOSIS — R011 Cardiac murmur, unspecified: Secondary | ICD-10-CM | POA: Diagnosis present

## 2016-05-27 DIAGNOSIS — H7091 Unspecified mastoiditis, right ear: Secondary | ICD-10-CM

## 2016-05-27 DIAGNOSIS — Z833 Family history of diabetes mellitus: Secondary | ICD-10-CM

## 2016-05-27 HISTORY — DX: Pneumonia, unspecified organism: J18.9

## 2016-05-27 HISTORY — DX: Gastro-esophageal reflux disease without esophagitis: K21.9

## 2016-05-27 HISTORY — DX: Cardiac murmur, unspecified: R01.1

## 2016-05-27 HISTORY — DX: Gestational diabetes mellitus in pregnancy, unspecified control: O24.419

## 2016-05-27 HISTORY — DX: Intracranial and intraspinal phlebitis and thrombophlebitis: G08

## 2016-05-27 HISTORY — DX: Migraine, unspecified, not intractable, without status migrainosus: G43.909

## 2016-05-27 LAB — CBC WITH DIFFERENTIAL/PLATELET
BASOS PCT: 0 %
Basophils Absolute: 0 10*3/uL (ref 0.0–0.1)
EOS ABS: 0.3 10*3/uL (ref 0.0–0.7)
EOS PCT: 2 %
HEMATOCRIT: 35.8 % — AB (ref 36.0–46.0)
HEMOGLOBIN: 12.6 g/dL (ref 12.0–15.0)
Lymphocytes Relative: 41 %
Lymphs Abs: 6 10*3/uL — ABNORMAL HIGH (ref 0.7–4.0)
MCH: 28.6 pg (ref 26.0–34.0)
MCHC: 35.2 g/dL (ref 30.0–36.0)
MCV: 81.2 fL (ref 78.0–100.0)
MONOS PCT: 8 %
Monocytes Absolute: 1.2 10*3/uL — ABNORMAL HIGH (ref 0.1–1.0)
NEUTROS ABS: 7.1 10*3/uL (ref 1.7–7.7)
NEUTROS PCT: 49 %
PLATELETS: 339 10*3/uL (ref 150–400)
RBC: 4.41 MIL/uL (ref 3.87–5.11)
RDW: 15.6 % — ABNORMAL HIGH (ref 11.5–15.5)
WBC: 14.6 10*3/uL — ABNORMAL HIGH (ref 4.0–10.5)

## 2016-05-27 LAB — BASIC METABOLIC PANEL
ANION GAP: 9 (ref 5–15)
BUN: 7 mg/dL (ref 6–20)
CHLORIDE: 102 mmol/L (ref 101–111)
CO2: 30 mmol/L (ref 22–32)
Calcium: 9.2 mg/dL (ref 8.9–10.3)
Creatinine, Ser: 0.74 mg/dL (ref 0.44–1.00)
GFR calc non Af Amer: >60 mL/min (ref >60)
GLUCOSE: 100 mg/dL — AB (ref 65–99)
POTASSIUM: 3.3 mmol/L — AB (ref 3.5–5.1)
Sodium: 141 mmol/L (ref 135–145)

## 2016-05-27 MED ORDER — HEPARIN (PORCINE) IN NACL 100-0.45 UNIT/ML-% IJ SOLN: 1000.0000 [IU]/h | INTRAMUSCULAR | Status: DC

## 2016-05-27 MED ORDER — DIPHENHYDRAMINE HCL 50 MG/ML IJ SOLN
25.0000 mg | Freq: Once | INTRAMUSCULAR | Status: AC
  Administered 2016-05-27: 25 mg via INTRAVENOUS
  Filled 2016-05-27: qty 1

## 2016-05-27 MED ORDER — HYDROMORPHONE HCL 2 MG/ML IJ SOLN
1.0000 mg | Freq: Once | INTRAMUSCULAR | Status: AC
  Administered 2016-05-27: 1 mg via INTRAVENOUS
  Filled 2016-05-27: qty 1

## 2016-05-27 MED ORDER — LIDOCAINE HCL (PF) 1 % IJ SOLN
30.0000 mL | Freq: Once | INTRAMUSCULAR | Status: AC
  Administered 2016-05-27: 30 mL via INTRADERMAL
  Filled 2016-05-27: qty 30

## 2016-05-27 MED ORDER — HEPARIN (PORCINE) IN NACL 100-0.45 UNIT/ML-% IJ SOLN
2400.0000 [IU]/h | INTRAMUSCULAR | Status: DC
  Administered 2016-05-28: 1750 [IU]/h via INTRAVENOUS
  Administered 2016-05-28: 1300 [IU]/h via INTRAVENOUS
  Administered 2016-05-29: 2100 [IU]/h via INTRAVENOUS
  Administered 2016-05-30: 2400 [IU]/h via INTRAVENOUS
  Filled 2016-05-27 (×7): qty 250

## 2016-05-27 MED ORDER — SODIUM CHLORIDE 0.9 % IV BOLUS (SEPSIS): 1000.0000 mL | Freq: Once | INTRAVENOUS | Status: DC

## 2016-05-27 MED ORDER — PROCHLORPERAZINE EDISYLATE 5 MG/ML IJ SOLN
10.0000 mg | Freq: Once | INTRAMUSCULAR | Status: AC
  Administered 2016-05-27: 10 mg via INTRAVENOUS
  Filled 2016-05-27: qty 2

## 2016-05-27 NOTE — ED Notes (Signed)
Patient transported to CT 

## 2016-05-27 NOTE — ED Provider Notes (Signed)
[facility]-EMERGENCY DEPT Provider Note   CSN: [id number] Arrival date & time: 05/27/16  1044     History   Chief Complaint Chief Complaint  Patient presents with  . Headache    HPI Angelica Brown is a 41 y.o. female with pertinent past medical history of migraines, obesity, tobacco abuse, DVT during pregnancy (2011) presents to the emergency department from ophthalmologist office reporting 10/10 headache associated with blurry vision, nausea, vomiting and left-sided posterior neck pain 8 days. Patient states she has a history of migraines that usually resolve after Excedrin or Aleve, she states this headache is "not like my migraines at all". Patient was seen at [facility] ED a week ago was given migraine cocktail and Zithromax for right sided TM effusion. Patient's headache continued and she presented to [facility] ED on 05/26/16 again for headache, was given a migraine cocktail without any relief and discharged with PCP follow-up. Patient followed up with PCP on 05/26/16 who referred her to neurology and ophthalmology and prescribed her Flexeril, ibuprofen and Zofran for her symptoms. Patient then followed up with ophthalmologist today (dr. Georges Mouse at [facility]) who referred her to the ED for urgent evaluation of bilateral disc edema on dilated eye exam this morning concerning for idiopathic intracranial hypertension and CVST. Patient brings no from Dr. Clelia Croft who recommends lumbar puncture, MRI brain and orbits to rule out IIH and CVST.  Patient currently endorses 10/10 headache but denies changes in her vision, nausea or vomiting.  HPI  Past Medical History:  Diagnosis Date  . CHOLELITHIASIS   . Dermatophytosis of nail   . Heartburn   . Hypertension   . Metrorrhagia   . OBESITY, MORBID   . OBESITY, NOS   . POSTURAL LIGHTHEADEDNESS   . Sickle cell trait (HCC)   . SYNCOPE   . Throat pain   . TOBACCO DEPENDENCE   . URI   . Urinary frequency     Patient Active  Problem List   Diagnosis Date Noted  . Intractable headache 05/26/2016  . 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 Leukocytosis 05/30/2015  . Onychomycosis 04/16/2013  . Family history of breast cancer 01/17/2013  . Chest pain 12/06/2011  . Sickle cell trait (HCC)   . OBESITY, MORBID 07/10/2008  . SYNCOPE 10/24/2006  . CHOLELITHIASIS 07/13/2006  . HEARTBURN 07/13/2006  . TOBACCO DEPENDENCE 06/16/2006  . METRORRHAGIA 06/16/2006    Past Surgical History:  Procedure Laterality Date  . BARTHOLIN GLAND CYST EXCISION  2004  . TUBAL LIGATION  2007  . WISDOM TOOTH EXTRACTION  1998    OB History    Gravida Para Term Preterm AB Living   5 5 4 1   4    SAB TAB Ectopic Multiple Live Births         1         Home Medications    Prior to Admission medications   Medication Sig Start Date End Date Taking? Authorizing Provider  aspirin-acetaminophen-caffeine (EXCEDRIN MIGRAINE) [phone] MG tablet Take 2 tablets by mouth every 6 (six) hours as needed for headache.   Yes Historical Provider, MD  cyclobenzaprine (FLEXERIL) 10 MG tablet Take 1 tablet (10 mg total) by mouth 3 (three) times daily as needed for muscle spasms. 05/26/16  Yes Moses Manners, MD  fluticasone (FLONASE) 50 MCG/ACT nasal spray Place 1 spray into the nose daily as needed for allergies.  01/16/16 01/15/17 Yes Historical Provider, MD  ibuprofen (ADVIL,MOTRIN) 800 MG tablet Take 1 tablet (800 mg total)  by mouth every 6 (six) hours as needed for moderate pain. 05/26/16  Yes Moses Manners, MD  Isopropyl Alcohol (SWIMMERS EAR DROPS OT) Place 1 drop into both ears daily as needed (fluid in ears).   Yes Historical Provider, MD  ondansetron (ZOFRAN) 4 MG tablet Take 1 tablet (4 mg total) by mouth every 8 (eight) hours as needed for nausea or vomiting. 05/26/16  Yes Moses Manners, MD  sulfamethoxazole-trimethoprim (BACTRIM DS,SEPTRA DS) 800-160 MG tablet Take 1 tablet by mouth 2 (two) times daily. Patient not taking: Reported on 05/24/2016  02/18/16   Felecia Shelling, DPM    Family History Family History  Problem Relation Age of Onset  . Breast cancer Mother   . Cancer Father     MELANOMA  . Diabetes Father   . Diabetes Paternal Grandmother   . Diabetes Paternal Grandfather     Social History Social History  Substance Use Topics  . Smoking status: Current Every Day Smoker    Years: 10.00    Types: Cigarettes  . Smokeless tobacco: Never Used  . Alcohol use No     Allergies   Prednisone; Strawberry extract; and Ampicillin   Review of Systems Review of Systems  Constitutional: Negative for appetite change, chills and fever.  HENT: Negative for congestion and sore throat.   Eyes: Positive for visual disturbance.  Respiratory: Negative for cough, choking, chest tightness and shortness of breath.   Cardiovascular: Negative for chest pain, palpitations and leg swelling.  Gastrointestinal: Positive for nausea and vomiting. Negative for abdominal pain, constipation and diarrhea.  Genitourinary: Negative for difficulty urinating and hematuria.  Musculoskeletal: Positive for neck pain. Negative for arthralgias.  Skin: Negative for rash and wound.  Neurological: Positive for headaches. Negative for dizziness, seizures, syncope, weakness, light-headedness and numbness.  Hematological: Does not bruise/bleed easily.  Psychiatric/Behavioral: Negative.      Physical Exam Updated Vital Signs BP 133/75 (BP Location: Left Arm)   Pulse 60   Temp 98.4 F (36.9 C) (Oral)   Resp 18   SpO2 97%   Physical Exam  Constitutional: She is oriented to person, place, and time. She appears well-developed and well-nourished.  NAD.  HENT:  Head: Normocephalic and atraumatic.  Nose: Nose normal.  Mouth/Throat: Oropharynx is clear and moist. No oropharyngeal exudate.  Moist mucous membranes.  No nasal mucosa edema. Sinuses non tender. Oropharynx and tonsils normal without edema, erythema, exudates or lesions.  Uvula midline. No  trismus.   Eyes: Conjunctivae and EOM are normal. Pupils are equal, round, and reactive to light.  Dilated, non reactive pupils (patient had dilated ophthalmology exam PTA).  Neck: Normal range of motion. Neck supple. No JVD present.  Left cervical paraspinal muscular tenderness.  Cardiovascular: Normal rate, regular rhythm, normal heart sounds and intact distal pulses.   No murmur heard. Pulmonary/Chest: Effort normal and breath sounds normal. No respiratory distress. She has no wheezes. She has no rales. She exhibits no tenderness.  Abdominal: Soft. Bowel sounds are normal. She exhibits no distension and no mass. There is no tenderness. There is no rebound and no guarding.  Musculoskeletal: Normal range of motion. She exhibits no deformity.  Lymphadenopathy:    She has no cervical adenopathy.  Neurological: She is alert and oriented to person, place, and time. No sensory deficit.  Pt is alert and oriented.   Speech and phonation normal.   Thought process coherent.   Strength 5/5 in upper and lower extremities.   Sensation to light touch  intact in upper and lower extremities.  Gait normal.   Negative Romberg. No leg drift.  Intact finger to nose test. CN I not tested CN II full visual fields  CN III, IV, VI PEERL and EOM intact CN V light touch intact in all 3 divisions of trigeminal nerve CN VII facial nerve movements intact, symmetric CN VIII hearing intact to finger rub CN IX, X no uvula deviation, symmetric soft palate rise CN XI 5/5 SCM and trapezius strength  CN XII Tongue midline with symmetric L/R movement  Skin: Skin is warm and dry. Capillary refill takes less than 2 seconds.  Psychiatric: She has a normal mood and affect. Her behavior is normal. Judgment and thought content normal.  Nursing note and vitals reviewed.    ED Treatments / Results  Labs (all labs ordered are listed, but only abnormal results are displayed) Labs Reviewed  BASIC METABOLIC PANEL - Abnormal;  Notable for the following:       Result Value   Potassium 3.3 (*)    Glucose, Bld 100 (*)    All other components within normal limits  CBC WITH DIFFERENTIAL/PLATELET - Abnormal; Notable for the following:    WBC 14.6 (*)    HCT 35.8 (*)    RDW 15.6 (*)    Lymphs Abs 6.0 (*)    Monocytes Absolute 1.2 (*)    All other components within normal limits    EKG  EKG Interpretation None       Radiology No results found.  Procedures Procedures (including critical care time)  Medications Ordered in ED Medications  prochlorperazine (COMPAZINE) injection 10 mg (10 mg Intravenous Given 05/27/16 1452)  diphenhydrAMINE (BENADRYL) injection 25 mg (25 mg Intravenous Given 05/27/16 1451)  HYDROmorphone (DILAUDID) injection 1 mg (1 mg Intravenous Given 05/27/16 1848)  lidocaine (PF) (XYLOCAINE) 1 % injection 30 mL (30 mLs Intradermal Given by Other 05/27/16 1848)     Initial Impression / Assessment and Plan / ED Course  I have reviewed the triage vital signs and the nursing notes.  Pertinent labs & imaging results that were available during my care of the patient were reviewed by me and considered in my medical decision making (see chart for details).  Clinical Course as of May 27 2014  Thu May 27, 2016  1904 Leukocytosis WBC: (!) 14.6 [CG]  1908 Initially hypertensive BP: (!) 165/102 [CG]  1909 SBP decreased BP: 133/75 [CG]  1909 Discussed LB with patient.  Dr. Erma Heritage to perform at bedside   [CG]  2006 Bedside LP unsuccessful, will call radiology to request LP under fluoro  [CG]    Clinical Course User Index [CG] Liberty Handy, PA-C   Concerns for intracranial concerned for idiopathic intracranial hypertension, less likely intracranial mass or abnormality causing recurrent headache. Patient had a dilated eye exam this morning PTA by ophthalmologist who found bilateral papilledema and sent patient to ED for further workup. Ophthalmologist is concerned for idiopathic intracranial  hypertension or CVST.  Ophtho recommended LP and MRI brain and orbit w/ w/o contrast and MRV to r/o intracranial mass and cerebral venous sinus thrombosis.  Pt does have h/o DVT during pregnancy ~2011.  Neurological exam normal today. Initially hypertensive at SBP 165, however this improved to 133.  Patient was given Compazine and Benadryl in the ED for migraine which did not help her symptoms. We will attempt lumbar puncture in the ED with Dr. Erma Heritage. If unsuccessful we'll consult interventional radiology for LP under fluoroscopy.  If  opening pressure normal may consider MRI and possible MRV with neurology consult. If opening pressure normal will d/c pt with diamox and neurology f/u.  Final Clinical Impressions(s) / ED Diagnoses   Final diagnoses:  Papilledema of both eyes  Acute nonintractable headache, unspecified headache type    New Prescriptions New Prescriptions   No medications on file     Liberty Handy, PA-C 05/27/16 1914    Liberty Handy, PA-C 05/27/16 2016    Raeford Razor, MD 06/07/16 [phone]

## 2016-05-27 NOTE — ED Provider Notes (Addendum)
Patient originally seen by Dr. Juleen China, awaiting Fluoro LP for evaluation of concern for IIH, sent by Ophtho for bilateral papilledem and request for LP. No CN deficits and recent MR/imaging reportedly negative. Notified after multiple hours by PA that pt cannot receive fluoro LP 2/2 no attempts in ED. I reviewed labs, imaging. Neuro exam non-focal. LP attempted x 1 without success. However, during LP pt stated to me that HA originally began acutely. She also now endorses a h/o blood clots during pregnancy and reportedly recently had a ear infection. Based on this, LP attempts stopped due to concern for intracranial lesion and/or dural venous thrombosis. CT head obtained and shows decreased flow in the dural sinuses. Will d/c fluoro LP, obtain MR/MRV, and plan for admission. Pt understandably upset by long ED stay but is appreciative. Will give analgesia for HA.      Shaune Pollack, MD 05/27/16 2259

## 2016-05-27 NOTE — Progress Notes (Signed)
ANTICOAGULATION CONSULT NOTE - Initial Consult  Pharmacy Consult for heparin Indication: suspected sinus venous thrombosis  Allergies  Allergen Reactions  . Prednisone Nausea And Vomiting and Other (See Comments)    Cramping in legs, could barely walk  . Strawberry Extract Itching and Swelling  . Ampicillin Rash    Patient Measurements: Height: 5\' 7"  (170.2 cm) Weight: (!) 354 lb 8 oz (160.8 kg) IBW/kg (Calculated) : 61.6 Heparin Dosing Weight: 105kg  Vital Signs: BP: 131/71 (02/08 2115) Pulse Rate: 48 (02/08 2115)  Labs:  Recent Labs  05/27/16 1425  HGB 12.6  HCT 35.8*  PLT 339  CREATININE 0.74    Estimated Creatinine Clearance: 151 mL/min (by C-G formula based on SCr of 0.74 mg/dL).   Medical History: Past Medical History:  Diagnosis Date  . CHOLELITHIASIS   . Dermatophytosis of nail   . Heartburn   . Hypertension   . Metrorrhagia   . OBESITY, MORBID   . OBESITY, NOS   . POSTURAL LIGHTHEADEDNESS   . Sickle cell trait (HCC)   . SYNCOPE   . Throat pain   . TOBACCO DEPENDENCE   . URI   . Urinary frequency     Assessment: 41yo female had been seen in ED 2/1 and 2/5 and had OV 2/7 all c/o intractable HA not relieved by pain meds and muscle relaxers offered by clinicians, today head CT shows increased density of dural venous sinuses w/ concern for thrombosis, to begin heparin; NP requests no bolus and low goal.  Goal of Therapy:  Heparin level 0.3-0.5 units/ml Monitor platelets by anticoagulation protocol: Yes   Plan:  Will begin heparin gtt at 1300 units/hr and monitor heparin levels and CBC.  Angelica Brown, PharmD, BCPS  05/27/2016,11:23 PM

## 2016-05-27 NOTE — ED Notes (Signed)
Pt states she is going to stay for LP at this time.

## 2016-05-27 NOTE — ED Provider Notes (Signed)
Medical screening examination/treatment/procedure(s) were conducted as a shared visit with non-physician practitioner(s) and myself.  I personally evaluated the patient during the encounter.   EKG Interpretation None       41 year old female with headaches. She's had intermittent headaches previously, but current headache has been present for almost a week and a half. She had an MRI in August which is fairly unremarkable. She went to ophthalmology today and noted to have bilateral papilledema so sent her to emergency room for further evaluation. I suspect that she may have idiopathic intracranial hypertension. Will obtain an LP to assess for opening pressure. If this is elevated I would start her on acetazolamide and have her follow-up with neurology. If her opening pressure is not elevated, I would obtain repeat imaging to evaluate for possible cerebral venous sinus thrombosis.   Raeford Razor, MD 06/09/16 [phone]

## 2016-05-27 NOTE — ED Notes (Signed)
Report attempted, RN to call back. 

## 2016-05-27 NOTE — ED Notes (Signed)
Patient transported to MRI 

## 2016-05-27 NOTE — ED Notes (Signed)
EDP performing LP at the bedside.

## 2016-05-27 NOTE — ED Notes (Signed)
patient very upset about wait/delay for MRI. She's been here 10h and wants to leave. Delay in LP earlier because of miscommunication. Pt now waiting MRI. Call to MRI - T.[facility] Explained it may be up to 90 minutes before this patient's turn. Explained plan to patient, who said she was leaving. Explained elevated WBC, asking her to stay. Pt c/o of not having anything to drink. Spoke with MD about delay and ok to give ice chips. Patient agreed to stay 90 minutes.

## 2016-05-27 NOTE — ED Provider Notes (Signed)
I spoke with Dr. Georg Ruddle for admission  Discussed CT finding which is concerning for Dura Sinus Thrombosis, MRI head, orbits and MR venogram ordered and in process  Heparin has been order as  benefits outweigh risk, have been considered    Earley Favor, NP 05/27/16 2314    Shaune Pollack, MD 05/28/16 1704    Shaune Pollack, MD 05/28/16 1705

## 2016-05-27 NOTE — ED Triage Notes (Signed)
Pt sent here from eye center for neuro eval and MRI after having migraine x 9 days

## 2016-05-27 NOTE — ED Notes (Addendum)
Pt called this RN to the room stating she needs to leave to pick up her niece. I called x-ray to see when pt would be able to have fluoro guided lumbar puncture and x-ray reports that EDP would have to attempt LP in the room prior to having the procedure in x-ray. Made EDP aware and will contact radiology.

## 2016-05-28 ENCOUNTER — Telehealth: Payer: Self-pay | Admitting: Family Medicine

## 2016-05-28 ENCOUNTER — Other Ambulatory Visit: Payer: Self-pay

## 2016-05-28 ENCOUNTER — Encounter (HOSPITAL_COMMUNITY): Payer: Self-pay | Admitting: General Practice

## 2016-05-28 DIAGNOSIS — Z880 Allergy status to penicillin: Secondary | ICD-10-CM | POA: Diagnosis not present

## 2016-05-28 DIAGNOSIS — I10 Essential (primary) hypertension: Secondary | ICD-10-CM

## 2016-05-28 DIAGNOSIS — Z832 Family history of diseases of the blood and blood-forming organs and certain disorders involving the immune mechanism: Secondary | ICD-10-CM

## 2016-05-28 DIAGNOSIS — R001 Bradycardia, unspecified: Secondary | ICD-10-CM | POA: Diagnosis not present

## 2016-05-28 DIAGNOSIS — Z8759 Personal history of other complications of pregnancy, childbirth and the puerperium: Secondary | ICD-10-CM

## 2016-05-28 DIAGNOSIS — H4710 Unspecified papilledema: Secondary | ICD-10-CM | POA: Insufficient documentation

## 2016-05-28 DIAGNOSIS — I676 Nonpyogenic thrombosis of intracranial venous system: Secondary | ICD-10-CM | POA: Diagnosis not present

## 2016-05-28 DIAGNOSIS — Z888 Allergy status to other drugs, medicaments and biological substances status: Secondary | ICD-10-CM | POA: Diagnosis not present

## 2016-05-28 DIAGNOSIS — I82C11 Acute embolism and thrombosis of right internal jugular vein: Secondary | ICD-10-CM | POA: Diagnosis present

## 2016-05-28 DIAGNOSIS — G009 Bacterial meningitis, unspecified: Secondary | ICD-10-CM | POA: Insufficient documentation

## 2016-05-28 DIAGNOSIS — G43909 Migraine, unspecified, not intractable, without status migrainosus: Secondary | ICD-10-CM | POA: Diagnosis present

## 2016-05-28 DIAGNOSIS — Z8379 Family history of other diseases of the digestive system: Secondary | ICD-10-CM | POA: Diagnosis not present

## 2016-05-28 DIAGNOSIS — H7091 Unspecified mastoiditis, right ear: Secondary | ICD-10-CM | POA: Diagnosis not present

## 2016-05-28 DIAGNOSIS — Z6841 Body Mass Index (BMI) 40.0 and over, adult: Secondary | ICD-10-CM | POA: Diagnosis not present

## 2016-05-28 DIAGNOSIS — E669 Obesity, unspecified: Secondary | ICD-10-CM

## 2016-05-28 DIAGNOSIS — R7881 Bacteremia: Secondary | ICD-10-CM | POA: Diagnosis not present

## 2016-05-28 DIAGNOSIS — M7989 Other specified soft tissue disorders: Secondary | ICD-10-CM | POA: Diagnosis not present

## 2016-05-28 DIAGNOSIS — G932 Benign intracranial hypertension: Secondary | ICD-10-CM | POA: Diagnosis present

## 2016-05-28 DIAGNOSIS — R51 Headache: Secondary | ICD-10-CM

## 2016-05-28 DIAGNOSIS — I808 Phlebitis and thrombophlebitis of other sites: Secondary | ICD-10-CM | POA: Diagnosis not present

## 2016-05-28 DIAGNOSIS — R011 Cardiac murmur, unspecified: Secondary | ICD-10-CM | POA: Diagnosis present

## 2016-05-28 DIAGNOSIS — H7011 Chronic mastoiditis, right ear: Secondary | ICD-10-CM | POA: Diagnosis present

## 2016-05-28 DIAGNOSIS — G08 Intracranial and intraspinal phlebitis and thrombophlebitis: Secondary | ICD-10-CM | POA: Diagnosis present

## 2016-05-28 DIAGNOSIS — Z86718 Personal history of other venous thrombosis and embolism: Secondary | ICD-10-CM | POA: Diagnosis not present

## 2016-05-28 DIAGNOSIS — Z831 Family history of other infectious and parasitic diseases: Secondary | ICD-10-CM

## 2016-05-28 DIAGNOSIS — M79609 Pain in unspecified limb: Secondary | ICD-10-CM | POA: Diagnosis not present

## 2016-05-28 DIAGNOSIS — I82611 Acute embolism and thrombosis of superficial veins of right upper extremity: Secondary | ICD-10-CM | POA: Diagnosis not present

## 2016-05-28 DIAGNOSIS — K219 Gastro-esophageal reflux disease without esophagitis: Secondary | ICD-10-CM | POA: Diagnosis present

## 2016-05-28 DIAGNOSIS — Z7982 Long term (current) use of aspirin: Secondary | ICD-10-CM | POA: Diagnosis not present

## 2016-05-28 DIAGNOSIS — Z91018 Allergy to other foods: Secondary | ICD-10-CM | POA: Diagnosis not present

## 2016-05-28 DIAGNOSIS — Z8632 Personal history of gestational diabetes: Secondary | ICD-10-CM | POA: Diagnosis not present

## 2016-05-28 DIAGNOSIS — H6691 Otitis media, unspecified, right ear: Secondary | ICD-10-CM | POA: Diagnosis present

## 2016-05-28 DIAGNOSIS — I5030 Unspecified diastolic (congestive) heart failure: Secondary | ICD-10-CM | POA: Diagnosis present

## 2016-05-28 DIAGNOSIS — Z79899 Other long term (current) drug therapy: Secondary | ICD-10-CM | POA: Diagnosis not present

## 2016-05-28 DIAGNOSIS — F17210 Nicotine dependence, cigarettes, uncomplicated: Secondary | ICD-10-CM | POA: Diagnosis present

## 2016-05-28 DIAGNOSIS — G039 Meningitis, unspecified: Secondary | ICD-10-CM | POA: Diagnosis present

## 2016-05-28 DIAGNOSIS — D573 Sickle-cell trait: Secondary | ICD-10-CM | POA: Diagnosis present

## 2016-05-28 HISTORY — DX: Intracranial and intraspinal phlebitis and thrombophlebitis: G08

## 2016-05-28 LAB — BASIC METABOLIC PANEL
ANION GAP: 12 (ref 5–15)
BUN: 6 mg/dL (ref 6–20)
CHLORIDE: 101 mmol/L (ref 101–111)
CO2: 24 mmol/L (ref 22–32)
Calcium: 9.1 mg/dL (ref 8.9–10.3)
Creatinine, Ser: 0.74 mg/dL (ref 0.44–1.00)
GFR calc non Af Amer: >60 mL/min (ref >60)
Glucose, Bld: 111 mg/dL — ABNORMAL HIGH (ref 65–99)
Potassium: 3.6 mmol/L (ref 3.5–5.1)
Sodium: 137 mmol/L (ref 135–145)

## 2016-05-28 LAB — HCG, SERUM, QUALITATIVE: Preg, Serum: NEGATIVE

## 2016-05-28 LAB — CBC
HCT: 36.6 % (ref 36.0–46.0)
Hemoglobin: 12.8 g/dL (ref 12.0–15.0)
MCH: 28.2 pg (ref 26.0–34.0)
MCHC: 35 g/dL (ref 30.0–36.0)
MCV: 80.6 fL (ref 78.0–100.0)
Platelets: 348 10*3/uL (ref 150–400)
RBC: 4.54 MIL/uL (ref 3.87–5.11)
RDW: 15.1 % (ref 11.5–15.5)
WBC: 16.6 10*3/uL — AB (ref 4.0–10.5)

## 2016-05-28 LAB — HEPARIN LEVEL (UNFRACTIONATED)
HEPARIN UNFRACTIONATED: 0.18 [IU]/mL — AB (ref 0.30–0.70)
Heparin Unfractionated: 0.13 IU/mL — ABNORMAL LOW (ref 0.30–0.70)
Heparin Unfractionated: 0.24 IU/mL — ABNORMAL LOW (ref 0.30–0.70)

## 2016-05-28 LAB — MRSA PCR SCREENING: MRSA by PCR: NEGATIVE

## 2016-05-28 LAB — STREP PNEUMONIAE URINARY ANTIGEN: STREP PNEUMO URINARY ANTIGEN: NEGATIVE

## 2016-05-28 MED ORDER — ONDANSETRON HCL 4 MG/2ML IJ SOLN
4.0000 mg | Freq: Once | INTRAMUSCULAR | Status: AC
  Administered 2016-05-28: 4 mg via INTRAVENOUS
  Filled 2016-05-28: qty 2

## 2016-05-28 MED ORDER — SODIUM CHLORIDE 0.9% FLUSH: 3.0000 mL | INTRAVENOUS | Status: DC | PRN

## 2016-05-28 MED ORDER — VANCOMYCIN HCL 10 G IV SOLR
1500.0000 mg | Freq: Three times a day (TID) | INTRAVENOUS | Status: DC
  Administered 2016-05-28 – 2016-06-02 (×14): 1500 mg via INTRAVENOUS
  Filled 2016-05-28 (×20): qty 1500

## 2016-05-28 MED ORDER — VANCOMYCIN HCL 10 G IV SOLR
2500.0000 mg | Freq: Once | INTRAVENOUS | Status: AC
  Administered 2016-05-28: 2500 mg via INTRAVENOUS
  Filled 2016-05-28: qty 2500

## 2016-05-28 MED ORDER — HEPARIN BOLUS VIA INFUSION
3000.0000 [IU] | Freq: Once | INTRAVENOUS | Status: AC
  Administered 2016-05-28: 3000 [IU] via INTRAVENOUS
  Filled 2016-05-28: qty 3000

## 2016-05-28 MED ORDER — SODIUM CHLORIDE 0.9% FLUSH
3.0000 mL | Freq: Two times a day (BID) | INTRAVENOUS | Status: DC
  Administered 2016-05-28 – 2016-05-31 (×7): 3 mL via INTRAVENOUS

## 2016-05-28 MED ORDER — MORPHINE SULFATE (PF) 2 MG/ML IV SOLN
2.0000 mg | INTRAVENOUS | Status: DC | PRN
  Administered 2016-05-28 – 2016-05-29 (×2): 2 mg via INTRAVENOUS
  Filled 2016-05-28 (×2): qty 1

## 2016-05-28 MED ORDER — METRONIDAZOLE 500 MG PO TABS
500.0000 mg | ORAL_TABLET | Freq: Four times a day (QID) | ORAL | Status: DC
  Administered 2016-05-28 – 2016-06-02 (×22): 500 mg via ORAL
  Filled 2016-05-28 (×22): qty 1

## 2016-05-28 MED ORDER — GADOBENATE DIMEGLUMINE 529 MG/ML IV SOLN
20.0000 mL | Freq: Once | INTRAVENOUS | Status: AC | PRN
  Administered 2016-05-28: 20 mL via INTRAVENOUS

## 2016-05-28 MED ORDER — HYDROMORPHONE HCL 2 MG/ML IJ SOLN
1.0000 mg | Freq: Once | INTRAMUSCULAR | Status: AC
  Administered 2016-05-28: 1 mg via INTRAVENOUS
  Filled 2016-05-28: qty 1

## 2016-05-28 MED ORDER — MORPHINE SULFATE (PF) 2 MG/ML IV SOLN: 2.0000 mg | Freq: Four times a day (QID) | INTRAVENOUS | Status: DC

## 2016-05-28 MED ORDER — SODIUM CHLORIDE 0.9 % IV SOLN
8.0000 mg | Freq: Three times a day (TID) | INTRAVENOUS | Status: DC | PRN
  Filled 2016-05-28: qty 4

## 2016-05-28 MED ORDER — CEFEPIME HCL 2 G IJ SOLR
2.0000 g | Freq: Three times a day (TID) | INTRAMUSCULAR | Status: DC
  Administered 2016-05-28 (×2): 2 g via INTRAVENOUS
  Filled 2016-05-28 (×2): qty 2

## 2016-05-28 MED ORDER — MORPHINE SULFATE (PF) 4 MG/ML IV SOLN
2.0000 mg | INTRAVENOUS | Status: DC | PRN
  Administered 2016-05-28 (×4): 2 mg via INTRAVENOUS
  Filled 2016-05-28 (×4): qty 1

## 2016-05-28 MED ORDER — SODIUM CHLORIDE 0.9 % IV SOLN: 250.0000 mL | INTRAVENOUS | Status: DC | PRN

## 2016-05-28 MED ORDER — SODIUM CHLORIDE 0.9% FLUSH
3.0000 mL | Freq: Two times a day (BID) | INTRAVENOUS | Status: DC
  Administered 2016-05-28 – 2016-05-31 (×6): 3 mL via INTRAVENOUS

## 2016-05-28 MED ORDER — DEXTROSE 5 % IV SOLN
2.0000 g | Freq: Two times a day (BID) | INTRAVENOUS | Status: DC
  Administered 2016-05-28 – 2016-06-02 (×10): 2 g via INTRAVENOUS
  Filled 2016-05-28 (×12): qty 2

## 2016-05-28 NOTE — ED Notes (Signed)
Heparin verified with Alver Fisher, RN

## 2016-05-28 NOTE — ED Notes (Signed)
Pt has been gone to restroom for a bit. Trying to start new bag of Heparin when pt returns.

## 2016-05-28 NOTE — Progress Notes (Signed)
Pharmacy Antibiotic Note  Angelica Brown is a 41 y.o. female admitted on 05/27/2016 with suspected mastoiditis.  Pharmacy has been consulted for Vancomycin and Cefepime dosing.  Plan: Cefepime 2gm IV q8h Vancomycin 2500gm IV now then 1500mg  IV q8h Will f/u micro data, renal function, and pt's clinical condition Vanc trough at Css   Height: 5\' 7"  (170.2 cm) Weight: (!) 354 lb 8 oz (160.8 kg) IBW/kg (Calculated) : 61.6  Temp (24hrs), Avg:98.4 F (36.9 C), Min:98.4 F (36.9 C), Max:98.4 F (36.9 C)   Recent Labs Lab 05/27/16 1425 05/28/16 0357  WBC 14.6* 16.6*  CREATININE 0.74 0.74    Estimated Creatinine Clearance: 151 mL/min (by C-G formula based on SCr of 0.74 mg/dL).    Allergies  Allergen Reactions  . Prednisone Nausea And Vomiting and Other (See Comments)    Cramping in legs, could barely walk  . Strawberry Extract Itching and Swelling  . Ampicillin Rash    Antimicrobials this admission: 2/9 Cefepime >>  2/9 Vanc >>   Dose adjustments this admission: n/a  Microbiology results: 2/9 BCx x2:   Thank you for allowing pharmacy to be a part of this patient's care.  Christoper Fabian, PharmD, BCPS Clinical pharmacist, pager [phone] 05/28/2016 5:50 AM

## 2016-05-28 NOTE — H&P (Signed)
Family Medicine Teaching [facility] Admission History and Physical Service Pager: [phone]  Patient name: Angelica Brown Medical record number: [id number] Date of birth: [birth year] Age: 41 y.o. Gender: female  Primary Care Provider: Levert Feinstein, MD Consultants: Neurology, Infectious Disease Code Status: Full  Chief Complaint: Intractable Headache  Assessment and Plan: Angelica Brown is a 41 y.o. female presenting with intractable headaches, found to have acute dural venous sinus thrombosis . PMH is significant for migraine, tobacco use, cholelithiasis  Acute dural venous sinus thrombosis - 4 days intractable headaches initially thought to be migraine but noted to have papilledema at ophthalmologist, now found to have dural venous sinus thrombosis on CT head, confirmed on MRI head.  Inciting factor thought to be mastitis. Non-focal neuro exam, mentating clearly, AAOx3. Neurology consulted in ED. Thinks thrombosis is likely 2/2 suspected mastoiditis. ID consulted for antibiotics recommendations given suspected inciting mastitis. - admit to FPTS stepdown - attending Dr. Deirdre Priest  - appreciate neurology recs:  heparin gtt  - ID consulted, appreciate recs - morphine 2 mg q6hPRN pain - Zofran PRN nausea - will order EKG for qTC  - Neuro checks - Vanc/Zosyn per pharm for now >> may change  Right otitis media/mastoiditis - WBC 14.6, +right and left otitis media effusions appreciated, soft tissue swelling noted on MRI, no mastoid tenderness, patient endorses intermittent shooting pain in mastoid region. Afebrile. - ID consult in AM for antibiotic recommendations - monitor fever curve - starting broad spec abx per pharm for now  Tobacco use - 11 pack/year tobacco history - tobacco cessation counseling when appropriate  FEN/GI: Regular diet Prophylaxis: Full dose heparin  Disposition: Admit to step down for heparin drip  History of Present Illness:  Angelica Brown is a 41 y.o. female presenting with intractable headaches of 4 days duration. Headache began on Tuesday 2/5 and was constant, diffuse throughout her entire head, with associated blurry vision secondary to pain. She developed nausea and vomiting with many episodes of emesis.  She does note right ear fullness with intermittent pangs of pain in the region of the right mastoid.  No congestion or rhinorrhea. The patient denies recent fevers or weight loss. No chest pain or palpitations, no diarrhea.  She endorses a history of one episode of DVT in the past when she was pregnant 11 years ago.  The patient was seen in the ED on 2/5 for headache and left neck pain and was treated for migraine. She was noted to have right ear effusion and was started on an azithromycin course. On 2/7 she was seen by her PCP for headache, nausea, and neck tightness, and she was treated for migraine thought to be secondary to neck spasm. 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 This week at her PCP the patient was referred to ophthalmology and neurology. At the ophthalmologist she was noted to have R papilledema and sent to the ED for imaging, where she underwent head CT and MRI that demonstrated sinus venous thrombosis.    Review Of Systems: Per HPI.  ROS  Patient Active Problem List   Diagnosis Date Noted  . 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 Intractable headache 05/26/2016  . 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 Leukocytosis 05/30/2015  . Onychomycosis 04/16/2013  . Family history of breast cancer 01/17/2013  .  Chest pain 12/06/2011  . Sickle cell trait (HCC)   . OBESITY, MORBID 07/10/2008  . SYNCOPE 10/24/2006  . CHOLELITHIASIS 07/13/2006  . HEARTBURN 07/13/2006  . TOBACCO DEPENDENCE 06/16/2006  . METRORRHAGIA  06/16/2006    Past Medical History: Past Medical History:  Diagnosis Date  . CHOLELITHIASIS   . Dermatophytosis of nail   . Heartburn   . Hypertension   . Metrorrhagia   . OBESITY, MORBID   . OBESITY, NOS   . POSTURAL LIGHTHEADEDNESS   . Sickle cell trait (HCC)   . SYNCOPE   . Throat pain   . TOBACCO DEPENDENCE   . URI   . Urinary frequency     Past Surgical History: Past Surgical History:  Procedure Laterality Date  . BARTHOLIN GLAND CYST EXCISION  2004  . TUBAL LIGATION  2007  . WISDOM TOOTH EXTRACTION  1998    Social History: Social History  Substance Use Topics  . Smoking status: Current Every Day Smoker    Years: 10.00    Types: Cigarettes  . Smokeless tobacco: Never Used  . Alcohol use No    Family History: Family History  Problem Relation Age of Onset  . Breast cancer Mother   . Cancer Father     MELANOMA  . Diabetes Father   . Diabetes Paternal Grandmother   . Diabetes Paternal Grandfather     Allergies and Medications: Allergies  Allergen Reactions  . Prednisone Nausea And Vomiting and Other (See Comments)    Cramping in legs, could barely walk  . Strawberry Extract Itching and Swelling  . Ampicillin Rash   No current facility-administered medications on file prior to encounter.    Current Outpatient Prescriptions on File Prior to Encounter  Medication Sig Dispense Refill  . aspirin-acetaminophen-caffeine (EXCEDRIN MIGRAINE) 250-250-65 MG tablet Take 2 tablets by mouth every 6 (six) hours as needed for headache.    . cyclobenzaprine (FLEXERIL) 10 MG tablet Take 1 tablet (10 mg total) by mouth 3 (three) times daily as needed for muscle spasms. 30 tablet 1  . fluticasone (FLONASE) 50 MCG/ACT nasal spray Place 1 spray into the nose daily as needed for allergies.     Marland Kitchen ibuprofen (ADVIL,MOTRIN) 800 MG tablet Take 1 tablet (800 mg total) by mouth every 6 (six) hours as needed for moderate pain. 60 tablet 1  . ondansetron (ZOFRAN) 4 MG tablet Take  1 tablet (4 mg total) by mouth every 8 (eight) hours as needed for nausea or vomiting. 20 tablet 1  . sulfamethoxazole-trimethoprim (BACTRIM DS,SEPTRA DS) 800-160 MG tablet Take 1 tablet by mouth 2 (two) times daily. (Patient not taking: Reported on 05/24/2016) 28 tablet 0    Objective: BP 130/73   Pulse (!) 48   Temp 98.4 F (36.9 C) (Oral)   Resp 13   Ht 5\' 7"  (1.702 m)   Wt (!) 354 lb 8 oz (160.8 kg)   SpO2 95%   BMI 55.52 kg/m  Exam: General: NAD, rests comfortably in bed, pleasant Eyes: PERRL, EOMI, no conjunctival pallor or injection ENTM: + otitis media effusions noted bilaterally, +irritation in the right ear canal, no rhinorrhea or congestion, no mastoidal tenderness or tenderness with moving the ear, no pain with opening the mouth or moving the jaw, no pain with clenching the teeth Neck: no LAD Cardiovascular: RRR, no m/r/g, heart sounds distant 2/2 large body habitus Respiratory: CTA bil, no W/R/R Gastrointestinal: soft, nontender, nondistended MSK: moves 4 extremiteis equally Derm: no rashes or lesions Neuro: CN  II-XII intact, non focal exam Psych: AAOx3, affect appropriate  Labs and Imaging: CBC BMET   Recent Labs Lab 05/27/16 1425  WBC 14.6*  HGB 12.6  HCT 35.8*  PLT 339    Recent Labs Lab 05/27/16 1425  NA 141  K 3.3*  CL 102  CO2 30  BUN 7  CREATININE 0.74  GLUCOSE 100*  CALCIUM 9.2      Howard Pouch, MD 05/28/2016, 3:50 AM PGY-1, [facility] FPTS Intern pager: [phone], text pages welcome   Upper Level Addendum:  I have seen and evaluated this patient along with Dr. Mosetta Putt and reviewed the above note, making necessary revisions in red.   Kathee Delton, MD,MS,  PGY3 05/28/2016 5:49 AM

## 2016-05-28 NOTE — ED Notes (Signed)
Pt eating breakfast tray 

## 2016-05-28 NOTE — Progress Notes (Signed)
FPTS Interim Progress Note  S: Patient did well overnight with no acute events. Pain at 8/10, receiving PRN morphine while I'm in the room. No focal neurologic complaints.  O: BP 115/59   Pulse (!) 54   Temp 98.4 F (36.9 C) (Oral)   Resp 17   Ht 5\' 7"  (1.702 m)   Wt (!) 354 lb 8 oz (160.8 kg)   SpO2 96%   BMI 55.52 kg/m   GEN: NAD, rests comfortably on ED stretcher, talks on phone to her mother  CARD: RRR/ no m/r/g PULM: CTA bil, no W/R/R NEURO: CN II-XII grossly intact, no focal deficits  A/P: 1. Dural venous sinus thrombosis - symptomatic support with zofran, morphine - frequent neuro checks - f/u neuro recs - appreciate ID recs  2. Right otitis media/mastoiditis - vanc/cefepine as above - appreciate ID recs - add flagyl 500 mg QID, pneumococcal Ag - ENT consult this AM, will f/u recs - blood cultures pending (before abx)  FEN/GI - regular diet, SLIV - full dose heparin  Dispo: Monitor in stepdown while on heparin gtt  Howard Pouch, MD 05/28/2016, 9:14 AM PGY-1, [facility] Family Medicine Service pager [phone]

## 2016-05-28 NOTE — Telephone Encounter (Signed)
Pt wanted to let Dr. Leveda Anna know that the opthalmologist found something and sent her to [facility] for a CT. Blood clots were found on the brain and pt is admitted. ep

## 2016-05-28 NOTE — Telephone Encounter (Signed)
Tried to contact pt and her daughter answered the phone and said her mom was at the hospital and I told her she could have her call or she could just tell her that Dr. Leveda Anna would be out of town until next week. Lamonte Sakai, April D, [place]

## 2016-05-28 NOTE — Telephone Encounter (Signed)
Dear [facility] Team Please let her know that Dr. Leveda Anna is out of town until next Thursday. THANKS! Denny Levy

## 2016-05-28 NOTE — Progress Notes (Signed)
05/28/2016 patient transfer from the emergency room to 4east on heparin gtt at 1837. She is alert, oriented and ambulatory. She receive CHG and MRSA swab. Patient skin intact and heels dry. Central monitoring and Elink was called. [facility] RN.

## 2016-05-28 NOTE — Progress Notes (Signed)
ANTICOAGULATION CONSULT NOTE  Pharmacy Consult for heparin Indication: sinus venous thrombosis  Allergies  Allergen Reactions  . Ampicillin Shortness Of Breath    Throat swelling  . Prednisone Nausea And Vomiting and Other (See Comments)    Cramping in legs, could barely walk  . Strawberry Extract Itching and Swelling    Patient Measurements: Height: 5\' 9"  (175.3 cm) Weight: (!) 354 lb 1.6 oz (160.6 kg) IBW/kg (Calculated) : 66.2 Heparin Dosing Weight: 105kg  Vital Signs: Temp: 99.5 F (37.5 C) (02/09 1900) Temp Source: Oral (02/09 1900) BP: 134/71 (02/09 1900) Pulse Rate: 64 (02/09 2044)  Labs:  Recent Labs  05/27/16 1425 05/28/16 0357 05/28/16 0724 05/28/16 1529 05/28/16 2149  HGB 12.6 12.8  --   --   --   HCT 35.8* 36.6  --   --   --   PLT 339 348  --   --   --   HEPARINUNFRC  --   --  0.13* 0.18* 0.24*  CREATININE 0.74 0.74  --   --   --     Estimated Creatinine Clearance: 155 mL/min (by C-G formula based on SCr of 0.74 mg/dL).  Assessment: 41yo female with sinus venous thrombosis seen on imaging. Patient was started on heparin WITHOUT bolus at lower goal at ED provider's request. Per family medicine, OK to target normal heparin goal of 0.3-0.7 given no intracranial bleed.  Heparin level remains SUBtherapeutic with upward trend to 0.24. No issues with line or bleeding reported per RN.  Goal of Therapy:  Heparin level 0.3-0.7 units/ml Monitor platelets by anticoagulation protocol: Yes   Plan:  -Heparin bolus 3000 units x1 then increase infusion to 2100 units/hr -Will f/u 6 hr heparin level  Christoper Fabian, PharmD, BCPS Clinical pharmacist, pager [phone] 05/28/2016 11:42 PM

## 2016-05-28 NOTE — Consult Note (Signed)
Reason for Consult: Dural venous thrombosis Referring Physician: [facility]  Angelica Brown is an 41 y.o. female with obesity, papilledema, and history of migraines who developed severe right-sided headache about 10 days ago.  She woke up with headache and nasal congestion which did not improve with Excedrin or Aleve. Two days later, she went to ED with right ear pressure and was treated with a migraine cocktail and was discharged with fluticasone, ibuprofen, and azithromycin. Two days later, she developed vomiting 6-7 times/day, nausea, intermittent blurry vision. She went back to ED on 2/5 at which time she passed out while vomiting. She went to PCP the following day at which time she was referred to opthalmology.  Her ophthalmologist yesterday recommended she seek help at the ED immediately for worsened papilledema.  MR imaging here was notable for acute dural venous sinus thrombosis and right mastoid effusion. Blood cultures were drawn, and she was started on vancomycin, cefepime, and metronidazole. ID was consulted for further management.  Additionally, she was treated in September with azithromycin for right ear pain and muffled hearing loss and symptoms improved.  She has had some right ear popping since then.  Last week, the right ear muffled again with pain when she was again treated with azithromycin.  She has family history of blood clots.   Past Medical History:  Diagnosis Date  . CHOLELITHIASIS   . Dermatophytosis of nail   . Heartburn   . Hypertension   . Metrorrhagia   . OBESITY, MORBID   . OBESITY, NOS   . POSTURAL LIGHTHEADEDNESS   . Sickle cell trait ([place])   . SYNCOPE   . Throat pain   . TOBACCO DEPENDENCE   . URI   . Urinary frequency     Past Surgical History:  Procedure Laterality Date  . [place] GLAND CYST EXCISION  2004  . TUBAL LIGATION  2007  . WISDOM TOOTH EXTRACTION  1998    Family History  Problem Relation Age of Onset  . Breast cancer  Mother   . Cancer Father     MELANOMA  . Diabetes Father   . Diabetes Paternal Grandmother   . Diabetes Paternal Grandfather     Social History:  reports that she has been smoking Cigarettes.  She has smoked for the past 10.00 years. She has never used smokeless tobacco. She reports that she does not drink alcohol or use drugs.  Allergies:  Allergies  Allergen Reactions  . Ampicillin Shortness Of Breath    Throat swelling  . Prednisone Nausea And Vomiting and Other (See Comments)    Cramping in legs, could barely walk  . Strawberry Extract Itching and Swelling    Medications: I have reviewed the patient's current medications.  Results for orders placed or performed during the hospital encounter of 05/27/16 (from the past 48 hour(s))  Basic metabolic panel     Status: Abnormal   Collection Time: 05/27/16  2:25 PM  Result Value Ref Range   Sodium 141 135 - 145 mmol/L   Potassium 3.3 (L) 3.5 - 5.1 mmol/L   Chloride 102 101 - 111 mmol/L   CO2 30 22 - 32 mmol/L   Glucose, Bld 100 (H) 65 - 99 mg/dL   BUN 7 6 - 20 mg/dL   Creatinine, Ser 0.74 0.44 - 1.00 mg/dL   Calcium 9.2 8.9 - 10.3 mg/dL   GFR calc non Af Amer >60 >60 mL/min   GFR calc Af Amer >60 >60 mL/min  Comment: (NOTE) The eGFR has been calculated using the CKD EPI equation. This calculation has not been validated in all clinical situations. eGFR's persistently <60 mL/min signify possible Chronic Kidney Disease.    Anion gap 9 5 - 15  CBC with Differential     Status: Abnormal   Collection Time: 05/27/16  2:25 PM  Result Value Ref Range   WBC 14.6 (H) 4.0 - 10.5 K/uL   RBC 4.41 3.87 - 5.11 MIL/uL   Hemoglobin 12.6 12.0 - 15.0 g/dL   HCT 35.8 (L) 36.0 - 46.0 %   MCV 81.2 78.0 - 100.0 fL   MCH 28.6 26.0 - 34.0 pg   MCHC 35.2 30.0 - 36.0 g/dL   RDW 15.6 (H) 11.5 - 15.5 %   Platelets 339 150 - 400 K/uL   Neutrophils Relative % 49 %   Lymphocytes Relative 41 %   Monocytes Relative 8 %   Eosinophils Relative 2  %   Basophils Relative 0 %   Neutro Abs 7.1 1.7 - 7.7 K/uL   Lymphs Abs 6.0 (H) 0.7 - 4.0 K/uL   Monocytes Absolute 1.2 (H) 0.1 - 1.0 K/uL   Eosinophils Absolute 0.3 0.0 - 0.7 K/uL   Basophils Absolute 0.0 0.0 - 0.1 K/uL   RBC Morphology TARGET CELLS    WBC Morphology ATYPICAL LYMPHOCYTES   Culture, blood (Routine X 2) w Reflex to ID Panel     Status: None (Preliminary result)   Collection Time: 05/28/16  3:50 AM  Result Value Ref Range   Specimen Description BLOOD RIGHT HAND    Special Requests BOTTLES DRAWN AEROBIC ONLY 5ML    Culture PENDING    Report Status PENDING   CBC     Status: Abnormal   Collection Time: 05/28/16  3:57 AM  Result Value Ref Range   WBC 16.6 (H) 4.0 - 10.5 K/uL   RBC 4.54 3.87 - 5.11 MIL/uL   Hemoglobin 12.8 12.0 - 15.0 g/dL   HCT 36.6 36.0 - 46.0 %   MCV 80.6 78.0 - 100.0 fL   MCH 28.2 26.0 - 34.0 pg   MCHC 35.0 30.0 - 36.0 g/dL   RDW 15.1 11.5 - 15.5 %   Platelets 348 150 - 400 K/uL  Basic metabolic panel     Status: Abnormal   Collection Time: 05/28/16  3:57 AM  Result Value Ref Range   Sodium 137 135 - 145 mmol/L   Potassium 3.6 3.5 - 5.1 mmol/L   Chloride 101 101 - 111 mmol/L   CO2 24 22 - 32 mmol/L   Glucose, Bld 111 (H) 65 - 99 mg/dL   BUN 6 6 - 20 mg/dL   Creatinine, Ser 0.74 0.44 - 1.00 mg/dL   Calcium 9.1 8.9 - 10.3 mg/dL   GFR calc non Af Amer >60 >60 mL/min   GFR calc Af Amer >60 >60 mL/min    Comment: (NOTE) The eGFR has been calculated using the CKD EPI equation. This calculation has not been validated in all clinical situations. eGFR's persistently <60 mL/min signify possible Chronic Kidney Disease.    Anion gap 12 5 - 15  Heparin level (unfractionated)     Status: Abnormal   Collection Time: 05/28/16  7:24 AM  Result Value Ref Range   Heparin Unfractionated 0.13 (L) 0.30 - 0.70 IU/mL    Comment:        IF HEPARIN RESULTS ARE BELOW EXPECTED VALUES, AND PATIENT DOSAGE HAS BEEN CONFIRMED, SUGGEST FOLLOW UP TESTING OF  ANTITHROMBIN III LEVELS.   hCG, serum, qualitative     Status: None   Collection Time: 05/28/16 12:59 PM  Result Value Ref Range   Preg, Serum NEGATIVE NEGATIVE    Comment:        THE SENSITIVITY OF THIS METHODOLOGY IS >10 mIU/mL.   Strep pneumoniae urinary antigen     Status: None   Collection Time: 05/28/16  2:55 PM  Result Value Ref Range   Strep Pneumo Urinary Antigen NEGATIVE NEGATIVE    Comment:        Infection due to S. pneumoniae cannot be absolutely ruled out since the antigen present may be below the detection limit of the test.   Heparin level (unfractionated)     Status: Abnormal   Collection Time: 05/28/16  3:29 PM  Result Value Ref Range   Heparin Unfractionated 0.18 (L) 0.30 - 0.70 IU/mL    Comment:        IF HEPARIN RESULTS ARE BELOW EXPECTED VALUES, AND PATIENT DOSAGE HAS BEEN CONFIRMED, SUGGEST FOLLOW UP TESTING OF ANTITHROMBIN III LEVELS.     Ct Head Wo Contrast  Result Date: 05/27/2016 CLINICAL DATA:  Headache with vomiting EXAM: CT HEAD WITHOUT CONTRAST TECHNIQUE: Contiguous axial images were obtained from the base of the skull through the vertex without intravenous contrast. COMPARISON:  MRI 12/09/2015, CT brain 11/23/2014 FINDINGS: Brain: No acute territorial infarction or hemorrhage is visualized. No focal mass, mass effect or midline shift. Ventricles nonenlarged. Vascular: Mildly hyperdense dural venous sinuses. No hyperdense arteries. No unexpected calcifications. Skull: Normal. Negative for fracture or focal lesion. Fluid in the inferior mastoids. Sinuses/Orbits: Mild mucosal thickening in the ethmoid sinuses. No acute orbital abnormality. Other: None IMPRESSION: 1. No hemorrhage, territorial infarct or mass is visualized. 2. Slight increased density of the dural venous sinuses, could be related to slow flow or possible dehydration. If venous thrombosis is suspected, further evaluation with MRI could be obtained. Electronically Signed   By: Donavan Foil M.D.   On: 05/27/2016 22:11   Mr Jeri Cos And Wo Contrast  Result Date: 05/28/2016 CLINICAL DATA:  41 y/o F; suspected idiopathic intracranial hypertension with sudden onset headache. EXAM: MRI ORBITS WITHOUT AND WITH CONTRAST MRI HEAD WITHOUT AND WITH CONTRAST MRI VENOGRAM WITHOUT CONTRAST TECHNIQUE: Multiplanar, multiecho pulse sequences of the brain and surrounding structures were obtained without and with intravenous contrast. Multiplanar, multiecho pulse sequences of the orbits and surrounding structures were obtained including fat saturation techniques, before and after intravenous contrast administration. MR venogram was performed without intravenous contrast. CONTRAST:  20 cc MultiHance COMPARISON:  05/27/2016 CT head FINDINGS: MRI HEAD FINDINGS Brain: No acute infarction, hemorrhage, hydrocephalus, extra-axial collection or mass lesion. No abnormal enhancement of brain parenchyma. Mild diffuse smooth pachymeningeal enhancement. Stable 2 mm tonsillar ectasia. Vascular: Increased T1 signal and loss of flow void on of superior sagittal sinus, right transverse sinus, right sigmoid sinus, and right upper internal jugular vein. Normal signal within the straight sinus, and left transverse sinus to upper internal jugular vein. Normal proximal arterial flow voids. Skull and upper cervical spine: Normal marrow signal. Other: None. MRI ORBITS FINDINGS Orbits: No traumatic or inflammatory finding. Globes, optic nerves, orbital fat, extraocular muscles, vascular structures, and lacrimal glands are normal. Bilateral dural ectasia of the optic nerve sheaths. Visualized sinuses: Right mastoid effusion. No abnormal signal of left mastoid air cells. Mucosal thickening of anterior ethmoid air cells and of the right frontal sinus. Soft tissues: Negative. Limited intracranial: As above. MRI VENOGRAM FINDINGS  Absent flow related signal within the mid and posterosuperior sagittal sinus, right transverse sinus, right  sigmoid sinus, and right upper internal jugular vein. Normal flow related signal within the straight sinus, internal cerebral veins, left transverse sinus, left sigmoid sinus, and left upper internal jugular vein. There are enlarged cortical veins likely resultant from collateral circulation. The anterior superior sagittal sinus demonstrates normal flow related signal. IMPRESSION: 1. Acute dural venous sinus thrombosis of the mid and posterior segments of the superior sagittal sinus, right transverse sinus, right sigmoid sinus, and right upper internal jugular vein. No hemorrhage or acute infarction of the brain. 2. Diffuse smooth pachymeningeal enhancement is likely related to dural venous sinus thrombosis. Meningeal infection/inflammation or intracranial hypotension is considered less likely. 3. Right mastoid effusion. No associate extra or intracranial abscess identified. 4. Bilateral optic nerve sheath dural ectasia is nonspecific which can be seen with idiopathic intracranial hypertension. These results were called by telephone at the time of interpretation on 05/28/2016 at 1:25 am to Dr. Junius Creamer , who verbally acknowledged these results. Electronically Signed   By: Kristine Garbe M.D.   On: 05/28/2016 01:30   Mr Annell Greening Head  Result Date: 05/28/2016 CLINICAL DATA:  41 y/o F; suspected idiopathic intracranial hypertension with sudden onset headache. EXAM: MRI ORBITS WITHOUT AND WITH CONTRAST MRI HEAD WITHOUT AND WITH CONTRAST MRI VENOGRAM WITHOUT CONTRAST TECHNIQUE: Multiplanar, multiecho pulse sequences of the brain and surrounding structures were obtained without and with intravenous contrast. Multiplanar, multiecho pulse sequences of the orbits and surrounding structures were obtained including fat saturation techniques, before and after intravenous contrast administration. MR venogram was performed without intravenous contrast. CONTRAST:  20 cc MultiHance COMPARISON:  05/27/2016 CT head  FINDINGS: MRI HEAD FINDINGS Brain: No acute infarction, hemorrhage, hydrocephalus, extra-axial collection or mass lesion. No abnormal enhancement of brain parenchyma. Mild diffuse smooth pachymeningeal enhancement. Stable 2 mm tonsillar ectasia. Vascular: Increased T1 signal and loss of flow void on of superior sagittal sinus, right transverse sinus, right sigmoid sinus, and right upper internal jugular vein. Normal signal within the straight sinus, and left transverse sinus to upper internal jugular vein. Normal proximal arterial flow voids. Skull and upper cervical spine: Normal marrow signal. Other: None. MRI ORBITS FINDINGS Orbits: No traumatic or inflammatory finding. Globes, optic nerves, orbital fat, extraocular muscles, vascular structures, and lacrimal glands are normal. Bilateral dural ectasia of the optic nerve sheaths. Visualized sinuses: Right mastoid effusion. No abnormal signal of left mastoid air cells. Mucosal thickening of anterior ethmoid air cells and of the right frontal sinus. Soft tissues: Negative. Limited intracranial: As above. MRI VENOGRAM FINDINGS Absent flow related signal within the mid and posterosuperior sagittal sinus, right transverse sinus, right sigmoid sinus, and right upper internal jugular vein. Normal flow related signal within the straight sinus, internal cerebral veins, left transverse sinus, left sigmoid sinus, and left upper internal jugular vein. There are enlarged cortical veins likely resultant from collateral circulation. The anterior superior sagittal sinus demonstrates normal flow related signal. IMPRESSION: 1. Acute dural venous sinus thrombosis of the mid and posterior segments of the superior sagittal sinus, right transverse sinus, right sigmoid sinus, and right upper internal jugular vein. No hemorrhage or acute infarction of the brain. 2. Diffuse smooth pachymeningeal enhancement is likely related to dural venous sinus thrombosis. Meningeal  infection/inflammation or intracranial hypotension is considered less likely. 3. Right mastoid effusion. No associate extra or intracranial abscess identified. 4. Bilateral optic nerve sheath dural ectasia is nonspecific which can be seen with idiopathic  intracranial hypertension. These results were called by telephone at the time of interpretation on 05/28/2016 at 1:25 am to Dr. Junius Creamer , who verbally acknowledged these results. Electronically Signed   By: Kristine Garbe M.D.   On: 05/28/2016 01:30   Mr Orbits W Wo Contrast  Result Date: 05/28/2016 CLINICAL DATA:  41 y/o F; suspected idiopathic intracranial hypertension with sudden onset headache. EXAM: MRI ORBITS WITHOUT AND WITH CONTRAST MRI HEAD WITHOUT AND WITH CONTRAST MRI VENOGRAM WITHOUT CONTRAST TECHNIQUE: Multiplanar, multiecho pulse sequences of the brain and surrounding structures were obtained without and with intravenous contrast. Multiplanar, multiecho pulse sequences of the orbits and surrounding structures were obtained including fat saturation techniques, before and after intravenous contrast administration. MR venogram was performed without intravenous contrast. CONTRAST:  20 cc MultiHance COMPARISON:  05/27/2016 CT head FINDINGS: MRI HEAD FINDINGS Brain: No acute infarction, hemorrhage, hydrocephalus, extra-axial collection or mass lesion. No abnormal enhancement of brain parenchyma. Mild diffuse smooth pachymeningeal enhancement. Stable 2 mm tonsillar ectasia. Vascular: Increased T1 signal and loss of flow void on of superior sagittal sinus, right transverse sinus, right sigmoid sinus, and right upper internal jugular vein. Normal signal within the straight sinus, and left transverse sinus to upper internal jugular vein. Normal proximal arterial flow voids. Skull and upper cervical spine: Normal marrow signal. Other: None. MRI ORBITS FINDINGS Orbits: No traumatic or inflammatory finding. Globes, optic nerves, orbital fat,  extraocular muscles, vascular structures, and lacrimal glands are normal. Bilateral dural ectasia of the optic nerve sheaths. Visualized sinuses: Right mastoid effusion. No abnormal signal of left mastoid air cells. Mucosal thickening of anterior ethmoid air cells and of the right frontal sinus. Soft tissues: Negative. Limited intracranial: As above. MRI VENOGRAM FINDINGS Absent flow related signal within the mid and posterosuperior sagittal sinus, right transverse sinus, right sigmoid sinus, and right upper internal jugular vein. Normal flow related signal within the straight sinus, internal cerebral veins, left transverse sinus, left sigmoid sinus, and left upper internal jugular vein. There are enlarged cortical veins likely resultant from collateral circulation. The anterior superior sagittal sinus demonstrates normal flow related signal. IMPRESSION: 1. Acute dural venous sinus thrombosis of the mid and posterior segments of the superior sagittal sinus, right transverse sinus, right sigmoid sinus, and right upper internal jugular vein. No hemorrhage or acute infarction of the brain. 2. Diffuse smooth pachymeningeal enhancement is likely related to dural venous sinus thrombosis. Meningeal infection/inflammation or intracranial hypotension is considered less likely. 3. Right mastoid effusion. No associate extra or intracranial abscess identified. 4. Bilateral optic nerve sheath dural ectasia is nonspecific which can be seen with idiopathic intracranial hypertension. These results were called by telephone at the time of interpretation on 05/28/2016 at 1:25 am to Dr. Junius Creamer , who verbally acknowledged these results. Electronically Signed   By: Kristine Garbe M.D.   On: 05/28/2016 01:30    Review of Systems  HENT: Positive for ear pain and hearing loss.   Eyes: Positive for blurred vision.  Gastrointestinal: Positive for nausea and vomiting.  Neurological: Positive for headaches.  All other  systems reviewed and are negative.  Blood pressure 129/75, pulse 66, temperature 98.4 F (36.9 C), temperature source Oral, resp. rate 20, height 5' 7" (1.702 m), weight (!) 354 lb 8 oz (160.8 kg), SpO2 97 %. Physical Exam  Constitutional: She is oriented to person, place, and time. She appears well-developed and well-nourished. No distress.  HENT:  Head: Normocephalic and atraumatic.  Right Ear: External ear normal.  Left  Ear: External ear normal.  Nose: Nose normal.  Mouth/Throat: Oropharynx is clear and moist.  TMs intact, MEs aerated, no inflammation.  Eyes: Conjunctivae and EOM are normal. Pupils are equal, round, and reactive to light.  Neck: Normal range of motion. Neck supple.  Cardiovascular: Normal rate.   Respiratory: Effort normal.  Musculoskeletal: Normal range of motion.  Neurological: She is alert and oriented to person, place, and time. No cranial nerve deficit.  Skin: Skin is warm and dry.  Psychiatric: She has a normal mood and affect. Her behavior is normal. Judgment and thought content normal.    Assessment/Plan: Cerebral venous thrombosis, likely acute septic, right mastoid effusion  I personally reviewed her brain MRI and head CT.  Right-sided mastoid effusion is minimal and there is no sign of acute middle ear infection or effusion.  Given her history, it seems most likely that her venous thrombosis is related to her right ear infections but there is no current need for surgical intervention such as myringotomy tube placement or mastoidectomy.  She is being appropriately treated with broad spectrum antibiotics and anti-coagulation per her admitting team and infectious disease.  Further evaluation for other causes such as a hypercoagulable state may still be worthwhile.  I will plan to have her follow-up with me after discharge.  We may consider myringotomy tube placement for long-term management.  Call with any further concerns.  Breeze Angell 05/28/2016, 6:46 PM

## 2016-05-28 NOTE — Progress Notes (Signed)
ANTICOAGULATION CONSULT NOTE  Pharmacy Consult for heparin Indication: sinus venous thrombosis  Allergies  Allergen Reactions  . Ampicillin Shortness Of Breath    Throat swelling  . Prednisone Nausea And Vomiting and Other (See Comments)    Cramping in legs, could barely walk  . Strawberry Extract Itching and Swelling    Patient Measurements: Height: 5\' 7"  (170.2 cm) Weight: (!) 354 lb 8 oz (160.8 kg) IBW/kg (Calculated) : 61.6 Heparin Dosing Weight: 105kg  Vital Signs: BP: 129/75 (02/09 1430) Pulse Rate: 66 (02/09 1430)  Labs:  Recent Labs  05/27/16 1425 05/28/16 0357 05/28/16 0724 05/28/16 1529  HGB 12.6 12.8  --   --   HCT 35.8* 36.6  --   --   PLT 339 348  --   --   HEPARINUNFRC  --   --  0.13* 0.18*  CREATININE 0.74 0.74  --   --     Estimated Creatinine Clearance: 151 mL/min (by C-G formula based on SCr of 0.74 mg/dL).   Medical History: Past Medical History:  Diagnosis Date  . CHOLELITHIASIS   . Dermatophytosis of nail   . Heartburn   . Hypertension   . Metrorrhagia   . OBESITY, MORBID   . OBESITY, NOS   . POSTURAL LIGHTHEADEDNESS   . Sickle cell trait ([facility])   . SYNCOPE   . Throat pain   . TOBACCO DEPENDENCE   . URI   . Urinary frequency     Assessment: 41yo female with sinus venous thrombosis seen on imaging. Patient was started on heparin WITHOUT bolus at lower goal at ED provider's request. Per family medicine, OK to target normal heparin goal of 0.3-0.7 given no intracranial bleed.  Heparin level remains SUBtherapeutic.   Goal of Therapy:  Heparin level 0.3-0.7 units/ml Monitor platelets by anticoagulation protocol: Yes   Plan:  -Heparin bolus 3000 units x1 then increase infusion to 1750 units/hr -Daily HL, CBC -Check level tonight   Agapito Games, PharmD, BCPS Clinical Pharmacist 05/28/2016 4:16 PM

## 2016-05-28 NOTE — Consult Note (Signed)
NEURO HOSPITALIST CONSULT NOTE   Requestig physician: Dr. Deirdre Priest  Reason for Consult: Severe headache with papilledema  History obtained from:  Patient and Chart     HPI:                                                                                                                                          Angelica Brown is an 41 y.o. female who presents for re-evaluation of severe headache. She has been to different EDs several times as well as to an outpatient clinic and an ophthalmologist for this. Her symptoms began last Tuesday (Jan 30) with a holocephalic headache and progressed to include N/V last Wednesday. The next day, Thursday, she began experiencing sharp intermittent right mastoid pain. She went to the ED for an evaluation and was diagnosed with what sounds like a right middle ear effusion ("water on my ear") and migraine headache. She was prescribed an antibiotic and was treated with pain medications. Her headache continued to persist and "I felt something just wasn't right". She also states, "this did not feel like one of my migraines". She presented again to ED but with no intervention that resolved her headache. She was then referred to an ophthalmologist who saw papilledema on exam and recommended that she be re-evaluated in the ED. Here at [facility] ED, a CT head was obtained revealing probable sagittal sinus and right transverse sinus thrombosis. IV heparin was started and an MRI with and without contrast in addition to MRV were obtained, confirming the diagnosis. Also noted on MRI was a right mastoid effusion, which is suspicious for possible infection given the thrombosis seen in the adjacent sigmoid sinus.   Past Medical History:  Diagnosis Date  . CHOLELITHIASIS   . Dermatophytosis of nail   . Heartburn   . Hypertension   . Metrorrhagia   . OBESITY, MORBID   . OBESITY, NOS   . POSTURAL LIGHTHEADEDNESS   . Sickle cell trait (HCC)   . SYNCOPE   .  Throat pain   . TOBACCO DEPENDENCE   . URI   . Urinary frequency     Past Surgical History:  Procedure Laterality Date  . BARTHOLIN GLAND CYST EXCISION  2004  . TUBAL LIGATION  2007  . WISDOM TOOTH EXTRACTION  1998    Family History  Problem Relation Age of Onset  . Breast cancer Mother   . Cancer Father     MELANOMA  . Diabetes Father   . Diabetes Paternal Grandmother   . Diabetes Paternal Grandfather    Social History:  reports that she has been smoking Cigarettes.  She has smoked for the past 10.00 years. She has never used smokeless tobacco. She reports that she does not drink alcohol or use drugs.  Allergies  Allergen Reactions  . Prednisone Nausea And Vomiting and Other (See Comments)    Cramping in legs, could barely walk  . Strawberry Extract Itching and Swelling  . Ampicillin Rash    HOME MEDICATIONS:                                                                                                                     aspirin-acetaminophen-caffeine (EXCEDRIN MIGRAINE) 250-250-65 MG tablet Take 2 tablets by mouth every 6 (six) hours as needed for headache. Howard Pouch, MD Not Ordered  cyclobenzaprine (FLEXERIL) 10 MG tablet Take 1 tablet (10 mg total) by mouth 3 (three) times daily as needed for muscle spasms. Howard Pouch, MD Not Ordered  fluticasone (FLONASE) 50 MCG/ACT nasal spray Place 1 spray into the nose daily as needed for allergies.  Howard Pouch, MD Not Ordered  ibuprofen (ADVIL,MOTRIN) 800 MG tablet Take 1 tablet (800 mg total) by mouth every 6 (six) hours as needed for moderate pain. Howard Pouch, MD Not Ordered  Isopropyl Alcohol (SWIMMERS EAR DROPS OT) Place 1 drop into both ears daily as needed (fluid in ears). Howard Pouch, MD Not Ordered  ondansetron (ZOFRAN) 4 MG tablet Take 1 tablet (4 mg total) by mouth every 8 (eight) hours as needed for nausea or vomiting. Howard Pouch, MD Not Ordered  sulfamethoxazole-trimethoprim (BACTRIM DS,SEPTRA DS) 800-160 MG tablet  Take 1 tablet by mouth 2 (two) times daily. Howard Pouch, MD Not Ordered   aspirin-acetaminophen-caffeine (EXCEDRIN MIGRAINE) 920-865-4245 MG tablet Take 2 tablets by mouth every 6 (six) hours as needed for headache. Howard Pouch, MD Not Ordered  cyclobenzaprine (FLEXERIL) 10 MG tablet Take 1 tablet (10 mg total) by mouth 3 (three) times daily as needed for muscle spasms. Howard Pouch, MD Not Ordered  fluticasone (FLONASE) 50 MCG/ACT nasal spray Place 1 spray into the nose daily as needed for allergies.  Howard Pouch, MD Not Ordered  ibuprofen (ADVIL,MOTRIN) 800 MG tablet Take 1 tablet (800 mg total) by mouth every 6 (six) hours as needed for moderate pain. Howard Pouch, MD Not Ordered  Isopropyl Alcohol (SWIMMERS EAR DROPS OT) Place 1 drop into both ears daily as needed (fluid in ears). Howard Pouch, MD Not Ordered  ondansetron (ZOFRAN) 4 MG tablet Take 1 tablet (4 mg total) by mouth every 8 (eight) hours as needed for nausea or vomiting. Howard Pouch, MD Not Ordered  sulfamethoxazole-trimethoprim (BACTRIM DS,SEPTRA DS) 800-160 MG tablet Take 1 tablet by mouth 2 (two) times daily. Howard Pouch, MD Not Ordered    ROS:  Denies visual blurring or tunnel vision. No lateralized weakness. Denies confusion or speech deficit. Denies fevers, chills or other symptoms of infection other than right mastoid pain.   Blood pressure 131/71, pulse (!) 48, temperature 98.4 F (36.9 C), temperature source Oral, resp. rate 18, height 5\' 7"  (1.702 m), weight (!) 160.8 kg (354 lb 8 oz), SpO2 95 %.  General Examination:                                                                                                      HEENT-  Inflammation to right tympanic membrane noted on otoscopic exam. Subtle subcutaneous edema is appreciated on palpation of right mastoid process.  Lungs- No gross wheezing.  Respirations unlabored.  Integument: Morbidly obese Extremities- Warm and well perfused  Neurological Examination Mental Status: Alert, oriented, thought content appropriate.  Speech fluent without evidence of aphasia.  Able to follow all commands without difficulty. Cranial Nerves: II: Fundoscopic exam reveals papilledema. Visual fields intact to confrontation with eyes tested individually. PERRL.  III,IV, VI: ptosis not present, EOMI without nystagmus V,VII: smile symmetric, facial temperature sensation normal bilaterally VIII: hearing intact to conversation IX,X: no hypophonia or hoarseness XI: Symmetric XII: midline tongue extension Motor: Right : Upper extremity   5/5    Left:     Upper extremity   5/5  Lower extremity   5/5     Lower extremity   5/5 Normal tone throughout; no atrophy noted Sensory: Temperature and light touch intact throughout, bilaterally Deep Tendon Reflexes: 1+ and symmetric throughout Plantars: Right: downgoing   Left: downgoing Cerebellar: No ataxia with FNF bilaterally Gait: Deferred   Lab Results: Basic Metabolic Panel:  Recent Labs Lab 05/27/16 1425  NA 141  K 3.3*  CL 102  CO2 30  GLUCOSE 100*  BUN 7  CREATININE 0.74  CALCIUM 9.2    Liver Function Tests: No results for input(s): AST, ALT, ALKPHOS, BILITOT, PROT, ALBUMIN in the last 168 hours. No results for input(s): LIPASE, AMYLASE in the last 168 hours. No results for input(s): AMMONIA in the last 168 hours.  CBC:  Recent Labs Lab 05/27/16 1425  WBC 14.6*  NEUTROABS 7.1  HGB 12.6  HCT 35.8*  MCV 81.2  PLT 339    Cardiac Enzymes: No results for input(s): CKTOTAL, CKMB, CKMBINDEX, TROPONINI in the last 168 hours.  Lipid Panel: No results for input(s): CHOL, TRIG, HDL, CHOLHDL, VLDL, LDLCALC in the last 168 hours.  CBG: No results for input(s): GLUCAP in the last 168 hours.  Microbiology: Results for orders placed or performed in visit on 05/02/15  Urine culture      Status: None   Collection Time: 05/02/15  2:46 PM  Result Value Ref Range Status   Colony Count 30,000 COLONIES/ML  Final   Organism ID, Bacteria PROTEUS MIRABILIS  Final    Comment: This organism may show imipenem resistance by mechanisms other than a carbapenemase.       Susceptibility   Proteus mirabilis -  (no method available)    AMPICILLIN <=2 Sensitive  AMOX/CLAVULANIC <=2 Sensitive     AMPICILLIN/SULBACTAM <=2 Sensitive     PIP/TAZO <=4 Sensitive     IMIPENEM 2 Intermediate     CEFAZOLIN <=4 Not Reportable     CEFTRIAXONE <=1 Sensitive     CEFTAZIDIME <=1 Sensitive     CEFEPIME <=1 Sensitive     GENTAMICIN <=1 Sensitive     TOBRAMYCIN <=1 Sensitive     CIPROFLOXACIN <=0.25 Sensitive     LEVOFLOXACIN <=0.12 Sensitive     NITROFURANTOIN 128 Resistant     TRIMETH/SULFA <=20 Sensitive     Coagulation Studies: No results for input(s): LABPROT, INR in the last 72 hours.  Imaging: Ct Head Wo Contrast  Result Date: 05/27/2016 CLINICAL DATA:  Headache with vomiting EXAM: CT HEAD WITHOUT CONTRAST TECHNIQUE: Contiguous axial images were obtained from the base of the skull through the vertex without intravenous contrast. COMPARISON:  MRI 12/09/2015, CT brain 11/23/2014 FINDINGS: Brain: No acute territorial infarction or hemorrhage is visualized. No focal mass, mass effect or midline shift. Ventricles nonenlarged. Vascular: Mildly hyperdense dural venous sinuses. No hyperdense arteries. No unexpected calcifications. Skull: Normal. Negative for fracture or focal lesion. Fluid in the inferior mastoids. Sinuses/Orbits: Mild mucosal thickening in the ethmoid sinuses. No acute orbital abnormality. Other: None IMPRESSION: 1. No hemorrhage, territorial infarct or mass is visualized. 2. Slight increased density of the dural venous sinuses, could be related to slow flow or possible dehydration. If venous thrombosis is suspected, further evaluation with MRI could be obtained. Electronically  Signed   By: Jasmine Pang M.D.   On: 05/27/2016 22:11   Mr Laqueta Jean And Wo Contrast  Result Date: 05/28/2016 CLINICAL DATA:  41 y/o F; suspected idiopathic intracranial hypertension with sudden onset headache. EXAM: MRI ORBITS WITHOUT AND WITH CONTRAST MRI HEAD WITHOUT AND WITH CONTRAST MRI VENOGRAM WITHOUT CONTRAST TECHNIQUE: Multiplanar, multiecho pulse sequences of the brain and surrounding structures were obtained without and with intravenous contrast. Multiplanar, multiecho pulse sequences of the orbits and surrounding structures were obtained including fat saturation techniques, before and after intravenous contrast administration. MR venogram was performed without intravenous contrast. CONTRAST:  20 cc MultiHance COMPARISON:  05/27/2016 CT head FINDINGS: MRI HEAD FINDINGS Brain: No acute infarction, hemorrhage, hydrocephalus, extra-axial collection or mass lesion. No abnormal enhancement of brain parenchyma. Mild diffuse smooth pachymeningeal enhancement. Stable 2 mm tonsillar ectasia. Vascular: Increased T1 signal and loss of flow void on of superior sagittal sinus, right transverse sinus, right sigmoid sinus, and right upper internal jugular vein. Normal signal within the straight sinus, and left transverse sinus to upper internal jugular vein. Normal proximal arterial flow voids. Skull and upper cervical spine: Normal marrow signal. Other: None. MRI ORBITS FINDINGS Orbits: No traumatic or inflammatory finding. Globes, optic nerves, orbital fat, extraocular muscles, vascular structures, and lacrimal glands are normal. Bilateral dural ectasia of the optic nerve sheaths. Visualized sinuses: Right mastoid effusion. No abnormal signal of left mastoid air cells. Mucosal thickening of anterior ethmoid air cells and of the right frontal sinus. Soft tissues: Negative. Limited intracranial: As above. MRI VENOGRAM FINDINGS Absent flow related signal within the mid and posterosuperior sagittal sinus, right  transverse sinus, right sigmoid sinus, and right upper internal jugular vein. Normal flow related signal within the straight sinus, internal cerebral veins, left transverse sinus, left sigmoid sinus, and left upper internal jugular vein. There are enlarged cortical veins likely resultant from collateral circulation. The anterior superior sagittal sinus demonstrates normal flow related signal. IMPRESSION: 1. Acute dural venous sinus thrombosis  of the mid and posterior segments of the superior sagittal sinus, right transverse sinus, right sigmoid sinus, and right upper internal jugular vein. No hemorrhage or acute infarction of the brain. 2. Diffuse smooth pachymeningeal enhancement is likely related to dural venous sinus thrombosis. Meningeal infection/inflammation or intracranial hypotension is considered less likely. 3. Right mastoid effusion. No associate extra or intracranial abscess identified. 4. Bilateral optic nerve sheath dural ectasia is nonspecific which can be seen with idiopathic intracranial hypertension. These results were called by telephone at the time of interpretation on 05/28/2016 at 1:25 am to Dr. Earley Favor , who verbally acknowledged these results. Electronically Signed   By: Mitzi Hansen M.D.   On: 05/28/2016 01:30   Mr Susie Cassette Head  Result Date: 05/28/2016 CLINICAL DATA:  41 y/o F; suspected idiopathic intracranial hypertension with sudden onset headache. EXAM: MRI ORBITS WITHOUT AND WITH CONTRAST MRI HEAD WITHOUT AND WITH CONTRAST MRI VENOGRAM WITHOUT CONTRAST TECHNIQUE: Multiplanar, multiecho pulse sequences of the brain and surrounding structures were obtained without and with intravenous contrast. Multiplanar, multiecho pulse sequences of the orbits and surrounding structures were obtained including fat saturation techniques, before and after intravenous contrast administration. MR venogram was performed without intravenous contrast. CONTRAST:  20 cc MultiHance COMPARISON:   05/27/2016 CT head FINDINGS: MRI HEAD FINDINGS Brain: No acute infarction, hemorrhage, hydrocephalus, extra-axial collection or mass lesion. No abnormal enhancement of brain parenchyma. Mild diffuse smooth pachymeningeal enhancement. Stable 2 mm tonsillar ectasia. Vascular: Increased T1 signal and loss of flow void on of superior sagittal sinus, right transverse sinus, right sigmoid sinus, and right upper internal jugular vein. Normal signal within the straight sinus, and left transverse sinus to upper internal jugular vein. Normal proximal arterial flow voids. Skull and upper cervical spine: Normal marrow signal. Other: None. MRI ORBITS FINDINGS Orbits: No traumatic or inflammatory finding. Globes, optic nerves, orbital fat, extraocular muscles, vascular structures, and lacrimal glands are normal. Bilateral dural ectasia of the optic nerve sheaths. Visualized sinuses: Right mastoid effusion. No abnormal signal of left mastoid air cells. Mucosal thickening of anterior ethmoid air cells and of the right frontal sinus. Soft tissues: Negative. Limited intracranial: As above. MRI VENOGRAM FINDINGS Absent flow related signal within the mid and posterosuperior sagittal sinus, right transverse sinus, right sigmoid sinus, and right upper internal jugular vein. Normal flow related signal within the straight sinus, internal cerebral veins, left transverse sinus, left sigmoid sinus, and left upper internal jugular vein. There are enlarged cortical veins likely resultant from collateral circulation. The anterior superior sagittal sinus demonstrates normal flow related signal. IMPRESSION: 1. Acute dural venous sinus thrombosis of the mid and posterior segments of the superior sagittal sinus, right transverse sinus, right sigmoid sinus, and right upper internal jugular vein. No hemorrhage or acute infarction of the brain. 2. Diffuse smooth pachymeningeal enhancement is likely related to dural venous sinus thrombosis. Meningeal  infection/inflammation or intracranial hypotension is considered less likely. 3. Right mastoid effusion. No associate extra or intracranial abscess identified. 4. Bilateral optic nerve sheath dural ectasia is nonspecific which can be seen with idiopathic intracranial hypertension. These results were called by telephone at the time of interpretation on 05/28/2016 at 1:25 am to Dr. Earley Favor , who verbally acknowledged these results. Electronically Signed   By: Mitzi Hansen M.D.   On: 05/28/2016 01:30   Mr Rockwell [place] ZO Contrast  Result Date: 05/28/2016 CLINICAL DATA:  41 y/o F; suspected idiopathic intracranial hypertension with sudden onset headache. EXAM: MRI ORBITS WITHOUT AND WITH CONTRAST  MRI HEAD WITHOUT AND WITH CONTRAST MRI VENOGRAM WITHOUT CONTRAST TECHNIQUE: Multiplanar, multiecho pulse sequences of the brain and surrounding structures were obtained without and with intravenous contrast. Multiplanar, multiecho pulse sequences of the orbits and surrounding structures were obtained including fat saturation techniques, before and after intravenous contrast administration. MR venogram was performed without intravenous contrast. CONTRAST:  20 cc MultiHance COMPARISON:  05/27/2016 CT head FINDINGS: MRI HEAD FINDINGS Brain: No acute infarction, hemorrhage, hydrocephalus, extra-axial collection or mass lesion. No abnormal enhancement of brain parenchyma. Mild diffuse smooth pachymeningeal enhancement. Stable 2 mm tonsillar ectasia. Vascular: Increased T1 signal and loss of flow void on of superior sagittal sinus, right transverse sinus, right sigmoid sinus, and right upper internal jugular vein. Normal signal within the straight sinus, and left transverse sinus to upper internal jugular vein. Normal proximal arterial flow voids. Skull and upper cervical spine: Normal marrow signal. Other: None. MRI ORBITS FINDINGS Orbits: No traumatic or inflammatory finding. Globes, optic nerves, orbital fat,  extraocular muscles, vascular structures, and lacrimal glands are normal. Bilateral dural ectasia of the optic nerve sheaths. Visualized sinuses: Right mastoid effusion. No abnormal signal of left mastoid air cells. Mucosal thickening of anterior ethmoid air cells and of the right frontal sinus. Soft tissues: Negative. Limited intracranial: As above. MRI VENOGRAM FINDINGS Absent flow related signal within the mid and posterosuperior sagittal sinus, right transverse sinus, right sigmoid sinus, and right upper internal jugular vein. Normal flow related signal within the straight sinus, internal cerebral veins, left transverse sinus, left sigmoid sinus, and left upper internal jugular vein. There are enlarged cortical veins likely resultant from collateral circulation. The anterior superior sagittal sinus demonstrates normal flow related signal. IMPRESSION: 1. Acute dural venous sinus thrombosis of the mid and posterior segments of the superior sagittal sinus, right transverse sinus, right sigmoid sinus, and right upper internal jugular vein. No hemorrhage or acute infarction of the brain. 2. Diffuse smooth pachymeningeal enhancement is likely related to dural venous sinus thrombosis. Meningeal infection/inflammation or intracranial hypotension is considered less likely. 3. Right mastoid effusion. No associate extra or intracranial abscess identified. 4. Bilateral optic nerve sheath dural ectasia is nonspecific which can be seen with idiopathic intracranial hypertension. These results were called by telephone at the time of interpretation on 05/28/2016 at 1:25 am to Dr. Earley Favor , who verbally acknowledged these results. Electronically Signed   By: Mitzi Hansen M.D.   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 MRI confirms presence of acute/subacute dural venous sinus thrombosis extending into the right drainage  pathway. The thrombosis involves the mid and posterior segments of the superior sagittal sinus, right transverse sinus, right sigmoid sinus, and right upper internal jugular vein. No hemorrhage or acute infarction of the brain is seen. There is associated diffuse smooth pachymeningeal enhancement, likely related to venous engorgement secondary to dural venous sinus thrombosis. Meningeal infection/inflammation is considered less likely. 2. Also seen on MRI is a right mastoid effusion. Given her symptoms of right mastoid pain and elevated white count, mastoiditis is likely and may be the underlying precipitant of her venous sinus thrombosis. No associated extra or intracranial abscess identified. 3. Bilateral widening of the perioptic CSF spaces as well as optic nerve sheath dural ectasia are seen on MRI of orbits. These findings are most likely related to intracranial hypertension secondary to the confirmed venous sinus thromboses.  Recommendations: 1. Continue heparin drip. Monitor PTT. Will need to  be transitioned to oral anticoagulation with duration of therapy at least 6 months.  2. ID consult for empiric treatment of probable right mastoiditis.  3. ENT consult in the AM for right mastoid effusion/mastoiditis.  4. Headache pain management.  5. If she deteriorates, rescan with CT and obtain Neurointerventional Radiology consultation for possible catheter-based local thrombolytic therapy.   Electronically signed: Dr. Caryl Pina 05/28/2016, 1:55 AM

## 2016-05-28 NOTE — Consult Note (Signed)
Date of Admission:  05/27/2016  Date of Consult:  05/28/2016  Reason for Consult: dural venous sinus thrombosis, fever Referring Physician: Dr. Burr Medico   HPI: Angelica Brown is an 41 y.o. female with obesity, migraines with ten-day history of severe headache. Ten days ago, she woke up with headache and nasal congestion which did not improve with Excedrin or Aleve. Two days later, she went to [facility] ED with right ear pressure and was treated with a migraine cocktail. She was discharged with fluticasone, ibuprofen, and azithromycin. Two days later, she developed vomiting 6-7 times/day, nausea, intermittent blurry vision. She went to the ED on 2/5 at which time she passed out while vomiting. She went to clinic the following day at which time she was referred to neurology and opthalmology, and her ophthalmologist yesterday recommended she seek help at the ED immediately for papilledema and optic nerve swelling. MR imaging here was notable for acute dural venous sinus thrombosis and right mastoid effusion. Blood cultures were drawn, and she was started on vancomycin, cefepime, and metronidazole. ID was consulted for further management.  She also had five pregnancies, of which one she did not carry to full-term [24 weeks] for unknown reasons. Of interest, her paternal aunt had problems with blood clots, and her father also had a blood clot at age 63 in his leg which spread to his lung though he also had cirrhosis and HCV infection. Her mother was diagnosed with breast cancer at age 42. She denies a family history of lupus or other autoimmune diseases. 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   Past Medical History:  Diagnosis Date  . CHOLELITHIASIS   . Dermatophytosis of nail   . Heartburn   . Hypertension   . Metrorrhagia     . OBESITY, MORBID   . OBESITY, NOS   . POSTURAL LIGHTHEADEDNESS   . Sickle cell trait ([place])   . SYNCOPE   . Throat pain   . TOBACCO DEPENDENCE   . URI   . Urinary frequency     Past Surgical History:  Procedure Laterality Date  . [place] GLAND CYST EXCISION  2004  . TUBAL LIGATION  2007  . [place] EXTRACTION  1998    Social History:  reports that she has been smoking Cigarettes.  She has smoked for the past 10.00 years. She has never used smokeless tobacco. She reports that she does not drink alcohol or use drugs.   Family History  Problem Relation Age of Onset  . Breast cancer Mother   . Cancer Father     MELANOMA  . Diabetes Father   . Diabetes Paternal Grandmother   . Diabetes Paternal Grandfather     Allergies  Allergen Reactions  . Prednisone Nausea And Vomiting and Other (See Comments)    Cramping in legs, could barely walk  . Strawberry Extract Itching and Swelling  . Ampicillin Rash     Medications: I have reviewed patients current medications as documented in Epic Anti-infectives    Start     Dose/Rate Route Frequency Ordered Stop   05/28/16 1400  vancomycin (VANCOCIN) 1,500 mg in sodium chloride 0.9 % 500 mL IVPB     1,500 mg 250 mL/hr over 120 Minutes Intravenous Every 8 hours 05/28/16 0555     05/28/16  1200  metroNIDAZOLE (FLAGYL) tablet 500 mg     500 mg Oral Every 6 hours 05/28/16 0908     05/28/16 0630  vancomycin (VANCOCIN) 2,500 mg in sodium chloride 0.9 % 500 mL IVPB     2,500 mg 250 mL/hr over 120 Minutes Intravenous  Once 05/28/16 0555 05/28/16 0950   05/28/16 0600  ceFEPIme (MAXIPIME) 2 g in dextrose 5 % 50 mL IVPB     2 g 100 mL/hr over 30 Minutes Intravenous Every 8 hours 05/28/16 0558        ROS: As noted in HPI otherwise remainder of 12 point Review of Systems is negative.   Blood pressure 107/58, pulse (!) 50, temperature 98.4 F (36.9 C), temperature source Oral, resp. rate 16, height _0  (1.702 m), weight (!) 354 lb 8  oz (160.8 kg), SpO2 95 %. General: obese female, resting in bed, tired-appearing HEENT: PERRL, EOMI, no scleral icterus, oropharynx clear, unable to visualize optic discs bilaterally, R tympanic membrane with erythema along the superior margin Cardiac: intermittent bradycardia, no rubs, murmurs or gallops Pulm: clear to auscultation bilaterally, no wheezes, rales, or rhonchi Abd: soft, nontender, nondistended, BS present Ext: warm and well perfused, no pedal edema Neuro: CN II-XII intact, 5/5 upper and lower extremity strength, 2+ grip strength, unable to elicit patellar reflexes, finger to nose intact  Results for orders placed or performed during the hospital encounter of 05/27/16 (from the past 48 hour(s))  Basic metabolic panel     Status: Abnormal   Collection Time: 05/27/16  2:25 PM  Result Value Ref Range   Sodium 141 135 - 145 mmol/L   Potassium 3.3 (L) 3.5 - 5.1 mmol/L   Chloride 102 101 - 111 mmol/L   CO2 30 22 - 32 mmol/L   Glucose, Bld 100 (H) 65 - 99 mg/dL   BUN 7 6 - 20 mg/dL   Creatinine, Ser 0.74 0.44 - 1.00 mg/dL   Calcium 9.2 8.9 - 10.3 mg/dL   GFR calc non Af Amer >60 >60 mL/min   GFR calc Af Amer >60 >60 mL/min    Comment: (NOTE) The eGFR has been calculated using the CKD EPI equation. This calculation has not been validated in all clinical situations. eGFR's persistently <60 mL/min signify possible Chronic Kidney Disease.    Anion gap 9 5 - 15  CBC with Differential     Status: Abnormal   Collection Time: 05/27/16  2:25 PM  Result Value Ref Range   WBC 14.6 (H) 4.0 - 10.5 K/uL   RBC 4.41 3.87 - 5.11 MIL/uL   Hemoglobin 12.6 12.0 - 15.0 g/dL   HCT 35.8 (L) 36.0 - 46.0 %   MCV 81.2 78.0 - 100.0 fL   MCH 28.6 26.0 - 34.0 pg   MCHC 35.2 30.0 - 36.0 g/dL   RDW 15.6 (H) 11.5 - 15.5 %   Platelets 339 150 - 400 K/uL   Neutrophils Relative % 49 %   Lymphocytes Relative 41 %   Monocytes Relative 8 %   Eosinophils Relative 2 %   Basophils Relative 0 %   Neutro  Abs 7.1 1.7 - 7.7 K/uL   Lymphs Abs 6.0 (H) 0.7 - 4.0 K/uL   Monocytes Absolute 1.2 (H) 0.1 - 1.0 K/uL   Eosinophils Absolute 0.3 0.0 - 0.7 K/uL   Basophils Absolute 0.0 0.0 - 0.1 K/uL   RBC Morphology TARGET CELLS    WBC Morphology ATYPICAL LYMPHOCYTES   Culture, blood (Routine X 2) w  Reflex to ID Panel     Status: None (Preliminary result)   Collection Time: 05/28/16  3:50 AM  Result Value Ref Range   Specimen Description BLOOD RIGHT HAND    Special Requests BOTTLES DRAWN AEROBIC ONLY 5ML    Culture PENDING    Report Status PENDING   CBC     Status: Abnormal   Collection Time: 05/28/16  3:57 AM  Result Value Ref Range   WBC 16.6 (H) 4.0 - 10.5 K/uL   RBC 4.54 3.87 - 5.11 MIL/uL   Hemoglobin 12.8 12.0 - 15.0 g/dL   HCT 36.6 36.0 - 46.0 %   MCV 80.6 78.0 - 100.0 fL   MCH 28.2 26.0 - 34.0 pg   MCHC 35.0 30.0 - 36.0 g/dL   RDW 15.1 11.5 - 15.5 %   Platelets 348 150 - 400 K/uL  Basic metabolic panel     Status: Abnormal   Collection Time: 05/28/16  3:57 AM  Result Value Ref Range   Sodium 137 135 - 145 mmol/L   Potassium 3.6 3.5 - 5.1 mmol/L   Chloride 101 101 - 111 mmol/L   CO2 24 22 - 32 mmol/L   Glucose, Bld 111 (H) 65 - 99 mg/dL   BUN 6 6 - 20 mg/dL   Creatinine, Ser 0.74 0.44 - 1.00 mg/dL   Calcium 9.1 8.9 - 10.3 mg/dL   GFR calc non Af Amer >60 >60 mL/min   GFR calc Af Amer >60 >60 mL/min    Comment: (NOTE) The eGFR has been calculated using the CKD EPI equation. This calculation has not been validated in all clinical situations. eGFR's persistently <60 mL/min signify possible Chronic Kidney Disease.    Anion gap 12 5 - 15  Heparin level (unfractionated)     Status: Abnormal   Collection Time: 05/28/16  7:24 AM  Result Value Ref Range   Heparin Unfractionated 0.13 (L) 0.30 - 0.70 IU/mL    Comment:        IF HEPARIN RESULTS ARE BELOW EXPECTED VALUES, AND PATIENT DOSAGE HAS BEEN CONFIRMED, SUGGEST FOLLOW UP TESTING OF ANTITHROMBIN III LEVELS.     _0 (sdes,specrequest,cult,reptstatus)   ) Recent Results (from the past 720 hour(s))  Culture, blood (Routine X 2) w Reflex to ID Panel     Status: None (Preliminary result)   Collection Time: 05/28/16  3:50 AM  Result Value Ref Range Status   Specimen Description BLOOD RIGHT HAND  Final   Special Requests BOTTLES DRAWN AEROBIC ONLY 5ML  Final   Culture PENDING  Incomplete   Report Status PENDING  Incomplete     Impression/Recommendation  Active Problems:   Dural venous sinus thrombosis    Talitha Y Cooper-Wade is a 41 y.o. female with obesity, hypertension hospitalized for acute dural venous sinus thrombosis.   Acute dural venous sinus thrombosis: Concern for infection given right mastoid effusion though not all of her symptoms are consistent with acute otitis media. Other risk factors would be hypercoaguable state given her family history.  -Continue vancomycin, cefepime, metronidazole [Day 1] though may discontinue cefepime given low risk of Pseudomonas. No history of diabetes or signs of otitis externa. -Recommend outpatient workup of hypercoaguability. Lupus is known to confer false positive with RPR testing as would other autoimmune disorders. Recheck HIV and pregnancy for now.   05/28/2016, 10:07 AM   Thank you so much for this interesting consult  [facility] [place] [phone] (pager) [phone] (office) 05/28/2016, 10:07  AM  Posey Pronto, [place] 05/28/2016, 10:07 AM

## 2016-05-28 NOTE — ED Notes (Signed)
Pt eating lunch tray  

## 2016-05-28 NOTE — ED Notes (Signed)
Lunch tray ordered for patient.

## 2016-05-28 NOTE — Progress Notes (Signed)
ANTICOAGULATION CONSULT NOTE - Follow up Consult  Pharmacy Consult for heparin Indication: sinus venous thrombosis  Allergies  Allergen Reactions  . Prednisone Nausea And Vomiting and Other (See Comments)    Cramping in legs, could barely walk  . Strawberry Extract Itching and Swelling  . Ampicillin Rash    Patient Measurements: Height: 5\' 7"  (170.2 cm) Weight: (!) 354 lb 8 oz (160.8 kg) IBW/kg (Calculated) : 61.6 Heparin Dosing Weight: 105kg  Vital Signs: BP: 115/59 (02/09 0908) Pulse Rate: 54 (02/09 0908)  Labs:  Recent Labs  05/27/16 1425 05/28/16 0357 05/28/16 0724  HGB 12.6 12.8  --   HCT 35.8* 36.6  --   PLT 339 348  --   HEPARINUNFRC  --   --  0.13*  CREATININE 0.74 0.74  --     Estimated Creatinine Clearance: 151 mL/min (by C-G formula based on SCr of 0.74 mg/dL).   Medical History: Past Medical History:  Diagnosis Date  . CHOLELITHIASIS   . Dermatophytosis of nail   . Heartburn   . Hypertension   . Metrorrhagia   . OBESITY, MORBID   . OBESITY, NOS   . POSTURAL LIGHTHEADEDNESS   . Sickle cell trait ([facility])   . SYNCOPE   . Throat pain   . TOBACCO DEPENDENCE   . URI   . Urinary frequency     Assessment: 41yo female with sinus venous thrombosis seen on imaging. Patient was started on heparin WITHOUT bolus at lower goal at ED provider's request. Per family medicine, OK to target normal heparin goal of 0.3-0.7 given no intracranial bleed.   Heparin level SUBtherapeutic on 1300 units/hr. CBC stable. Per RN - no interruptions in infusion.   Goal of Therapy:  Heparin level 0.3-0.7 units/ml Monitor platelets by anticoagulation protocol: Yes   Plan:  Heparin 3000 units bolus x1 Increase heparin infusion rate to 1500 units/hr 6 hr heparin level (@1500 ) Daily heparin level, CBC Monitor for s/sx bleeding, clinical course  Allena Katz, Pharm.D. PGY1 Pharmacy Resident 2/9/20189:24 AM Pager [phone]

## 2016-05-29 DIAGNOSIS — Z888 Allergy status to other drugs, medicaments and biological substances status: Secondary | ICD-10-CM

## 2016-05-29 DIAGNOSIS — Z833 Family history of diabetes mellitus: Secondary | ICD-10-CM

## 2016-05-29 DIAGNOSIS — Z91018 Allergy to other foods: Secondary | ICD-10-CM

## 2016-05-29 DIAGNOSIS — H7011 Chronic mastoiditis, right ear: Secondary | ICD-10-CM

## 2016-05-29 DIAGNOSIS — Z808 Family history of malignant neoplasm of other organs or systems: Secondary | ICD-10-CM

## 2016-05-29 DIAGNOSIS — Z880 Allergy status to penicillin: Secondary | ICD-10-CM

## 2016-05-29 DIAGNOSIS — F17210 Nicotine dependence, cigarettes, uncomplicated: Secondary | ICD-10-CM

## 2016-05-29 DIAGNOSIS — Z803 Family history of malignant neoplasm of breast: Secondary | ICD-10-CM

## 2016-05-29 DIAGNOSIS — H7091 Unspecified mastoiditis, right ear: Secondary | ICD-10-CM

## 2016-05-29 LAB — BLOOD CULTURE ID PANEL (REFLEXED)
Acinetobacter baumannii: NOT DETECTED
CANDIDA ALBICANS: NOT DETECTED
CANDIDA KRUSEI: NOT DETECTED
CANDIDA PARAPSILOSIS: NOT DETECTED
Candida glabrata: NOT DETECTED
Candida tropicalis: NOT DETECTED
ENTEROBACTERIACEAE SPECIES: NOT DETECTED
ESCHERICHIA COLI: NOT DETECTED
Enterobacter cloacae complex: NOT DETECTED
Enterococcus species: NOT DETECTED
Haemophilus influenzae: NOT DETECTED
KLEBSIELLA OXYTOCA: NOT DETECTED
Klebsiella pneumoniae: NOT DETECTED
LISTERIA MONOCYTOGENES: NOT DETECTED
METHICILLIN RESISTANCE: NOT DETECTED
Neisseria meningitidis: NOT DETECTED
PSEUDOMONAS AERUGINOSA: NOT DETECTED
Proteus species: NOT DETECTED
STREPTOCOCCUS AGALACTIAE: NOT DETECTED
STREPTOCOCCUS PNEUMONIAE: NOT DETECTED
STREPTOCOCCUS PYOGENES: NOT DETECTED
Serratia marcescens: NOT DETECTED
Staphylococcus aureus (BCID): NOT DETECTED
Staphylococcus species: DETECTED — AB
Streptococcus species: NOT DETECTED

## 2016-05-29 LAB — BASIC METABOLIC PANEL
Anion gap: 13 (ref 5–15)
BUN: 5 mg/dL — AB (ref 6–20)
CO2: 24 mmol/L (ref 22–32)
Calcium: 9 mg/dL (ref 8.9–10.3)
Chloride: 100 mmol/L — ABNORMAL LOW (ref 101–111)
Creatinine, Ser: 0.71 mg/dL (ref 0.44–1.00)
GFR calc Af Amer: >60 mL/min (ref >60)
GLUCOSE: 98 mg/dL (ref 65–99)
POTASSIUM: 3.5 mmol/L (ref 3.5–5.1)
Sodium: 137 mmol/L (ref 135–145)

## 2016-05-29 LAB — HEPARIN LEVEL (UNFRACTIONATED)
Heparin Unfractionated: 0.29 IU/mL — ABNORMAL LOW (ref 0.30–0.70)
Heparin Unfractionated: 0.53 IU/mL (ref 0.30–0.70)

## 2016-05-29 LAB — CBC
HEMATOCRIT: 36.7 % (ref 36.0–46.0)
Hemoglobin: 12.8 g/dL (ref 12.0–15.0)
MCH: 28.2 pg (ref 26.0–34.0)
MCHC: 34.9 g/dL (ref 30.0–36.0)
MCV: 80.8 fL (ref 78.0–100.0)
PLATELETS: 363 10*3/uL (ref 150–400)
RBC: 4.54 MIL/uL (ref 3.87–5.11)
RDW: 15.1 % (ref 11.5–15.5)
WBC: 18.5 10*3/uL — ABNORMAL HIGH (ref 4.0–10.5)

## 2016-05-29 LAB — HIV ANTIBODY (ROUTINE TESTING W REFLEX): HIV Screen 4th Generation wRfx: NONREACTIVE

## 2016-05-29 MED ORDER — PROCHLORPERAZINE EDISYLATE 5 MG/ML IJ SOLN: 10.0000 mg | INTRAMUSCULAR | Status: DC | PRN

## 2016-05-29 MED ORDER — TOPIRAMATE 25 MG PO TABS
25.0000 mg | ORAL_TABLET | Freq: Two times a day (BID) | ORAL | Status: DC
  Administered 2016-05-29 – 2016-06-02 (×9): 25 mg via ORAL
  Filled 2016-05-29 (×10): qty 1

## 2016-05-29 MED ORDER — ACETAMINOPHEN 325 MG PO TABS
650.0000 mg | ORAL_TABLET | Freq: Four times a day (QID) | ORAL | Status: DC | PRN
  Administered 2016-05-31 – 2016-06-02 (×2): 650 mg via ORAL
  Filled 2016-05-29 (×3): qty 2

## 2016-05-29 MED ORDER — MORPHINE SULFATE (PF) 4 MG/ML IV SOLN
4.0000 mg | INTRAVENOUS | Status: DC | PRN
  Administered 2016-05-29 – 2016-05-30 (×2): 4 mg via INTRAVENOUS
  Filled 2016-05-29 (×2): qty 1

## 2016-05-29 MED ORDER — MORPHINE SULFATE (PF) 2 MG/ML IV SOLN
2.0000 mg | INTRAVENOUS | Status: DC
  Administered 2016-05-29 – 2016-05-30 (×9): 2 mg via INTRAVENOUS
  Filled 2016-05-29 (×9): qty 1

## 2016-05-29 MED ORDER — KETOROLAC TROMETHAMINE 30 MG/ML IJ SOLN
30.0000 mg | Freq: Once | INTRAMUSCULAR | Status: AC
  Administered 2016-05-29: 30 mg via INTRAVENOUS
  Filled 2016-05-29: qty 1

## 2016-05-29 NOTE — Progress Notes (Signed)
ANTICOAGULATION CONSULT NOTE  Pharmacy Consult for Heparin Indication: sinus venous thrombosis  Assessment: 41yo female with sinus venous thrombosis seen on imaging. Patient was started on heparin WITHOUT bolus at lower goal at ED provider's request. Per family medicine, OK to target normal heparin goal of 0.3-0.7 given no intracranial bleed. Heparin level is now therapeutic at 0.53. No issues with line or bleeding reported per RN, CBC stable.  Goal of Therapy:  Heparin level 0.3-0.7 units/ml Monitor platelets by anticoagulation protocol: Yes   Plan:  Continue heparin gtt 2400 units/hr Daily heparin level and CBC   Allergies  Allergen Reactions  . Ampicillin Shortness Of Breath    Throat swelling  . Prednisone Nausea And Vomiting and Other (See Comments)    Cramping in legs, could barely walk  . Strawberry Extract Itching and Swelling    Patient Measurements: Height: 5\' 9"  (175.3 cm) Weight: (!) 354 lb 1.6 oz (160.6 kg) IBW/kg (Calculated) : 66.2 Heparin Dosing Weight: 105kg  Vital Signs: Temp: 98.7 F (37.1 C) (02/10 1700) Temp Source: Oral (02/10 1700) BP: 140/83 (02/10 1700) Pulse Rate: 55 (02/10 1700)  Labs:  Recent Labs  05/27/16 1425 05/28/16 0357  05/28/16 2149 05/29/16 0144 05/29/16 0924 05/29/16 1850  HGB 12.6 12.8  --   --  12.8  --   --   HCT 35.8* 36.6  --   --  36.7  --   --   PLT 339 348  --   --  363  --   --   HEPARINUNFRC  --   --   < > 0.24*  --  0.29* 0.53  CREATININE 0.74 0.74  --   --  0.71  --   --   < > = values in this interval not displayed.  Estimated Creatinine Clearance: 155 mL/min (by C-G formula based on SCr of 0.71 mg/dL).   Lysle Pearl, PharmD, BCPS Pager # [phone] 05/29/2016 7:43 PM

## 2016-05-29 NOTE — Progress Notes (Signed)
Family Medicine Teaching Service Daily Progress Note Intern Pager: [phone]  Patient name: Angelica Brown Medical record number: [id number] Date of birth: Nov 18, 1975 Age: 41 y.o. Gender: female  Primary Care Provider: Levert Feinstein, MD Consultants: Neurology, ENT, ID Code Status: Full  Pt Overview and Major Events to Date:  2/9 - Admitted with dural venous sinus thrombosis  Assessment and Plan: Angelica Brown is a 41 y.o. female presenting with intractable headaches, found to have acute dural venous sinus thrombosis . PMH is significant for migraine, tobacco use, cholelithiasis  Acute dural venous sinus thrombosis - Confirmed on CT and MRI. Unclear inciting factor. Possible mastoiditis may be contributing. Not on hormonal therapy. Nonfocal neuro exam. - Heparin drip per neuro recs - Will need transition to oral anticoagulation  - morphine 2 mg q6hPRN pain - Zofran prn nausea - Low threshold to rescan head if symptoms worsen, repeat prior to D/C per neuro recs - Check pregnancy test  Possible right mastoiditis - Effusion noted on MRI. Patient has shooting pain in mastoid region, but no other clinic signs.  - Vanc/CTX/Flagyl per ID recs - Will need 3 weeks of IV antibiotics per ID - ENT consulted - no need for surgical intervention at this time, will follow up outpatient.  - BCx - coag neg staph in 1/2 bottles, likely contaminant   Chronic Migraines - Start topamax 25mg  bid per neuro recs  Tobacco use - 11 pack/year tobacco history - tobacco cessation counseling when appropriate  FEN/GI: Regular diet Prophylaxis: Full dose heparin  Disposition: Admitted pending above management.   Subjective:  Doing better this morning.Headaches about a 3 this morning.   Objective: Temp:  [98.7 F (37.1 C)-99.5 F (37.5 C)] 98.7 F (37.1 C) (02/10 0400) Pulse Rate:  [50-86] 86 (02/10 0400) Resp:  [12-21] 12 (02/10 0400) BP: (100-134)/(58-82) 129/66 (02/10  0400) SpO2:  [95 %-99 %] 96 % (02/10 0400) Weight:  [354 lb 1.6 oz (160.6 kg)] 354 lb 1.6 oz (160.6 kg) (02/09 1900) Physical Exam: General: 40yo female in NAD resting comfortably in bed HEENT: No mastoid tenderness.  Cardiovascular: RRR, no murmurs, distant hear sounds Respiratory: NWOB, CTAB Abdomen: Obese, S, NT, ND Extremities: No cyanosis Neuro: CN2-12 grossly intact. Strength 5/5 in upper and lower extremities. Sensation to light touch grossly intact.   Laboratory:  Recent Labs Lab 05/27/16 1425 05/28/16 0357 05/29/16 0144  WBC 14.6* 16.6* 18.5*  HGB 12.6 12.8 12.8  HCT 35.8* 36.6 36.7  PLT 339 348 363    Recent Labs Lab 05/27/16 1425 05/28/16 0357 05/29/16 0144  NA 141 137 137  K 3.3* 3.6 3.5  CL 102 101 100*  CO2 30 24 24   BUN 7 6 5*  CREATININE 0.74 0.74 0.71  CALCIUM 9.2 9.1 9.0  GLUCOSE 100* 111* 98   Imaging/Diagnostic Tests: None New  Angelica Dark, MD 05/29/2016, 8:28 AM PGY-3, [facility] FPTS Intern pager: [phone], text pages welcome

## 2016-05-29 NOTE — Progress Notes (Signed)
ANTICOAGULATION CONSULT NOTE  Pharmacy Consult for Heparin Indication: sinus venous thrombosis  Assessment: 41yo female with sinus venous thrombosis seen on imaging. Patient was started on heparin WITHOUT bolus at lower goal at ED provider's request. Per family medicine, OK to target normal heparin goal of 0.3-0.7 given no intracranial bleed.  Heparin level is 0.29 (subtherapeutic). No issues with line or bleeding reported per RN, CBC stable.  Goal of Therapy:  Heparin level 0.3-0.7 units/ml Monitor platelets by anticoagulation protocol: Yes   Plan:  -Heparin increase infusion to 2400 units/hr -Will f/u 6 hr heparin level -Monitor daily HL, CBC, s/sx's of bleeding -Follow-up long-term anticoagulation plan   Allergies  Allergen Reactions  . Ampicillin Shortness Of Breath    Throat swelling  . Prednisone Nausea And Vomiting and Other (See Comments)    Cramping in legs, could barely walk  . Strawberry Extract Itching and Swelling    Patient Measurements: Height: 5\' 9"  (175.3 cm) Weight: (!) 354 lb 1.6 oz (160.6 kg) IBW/kg (Calculated) : 66.2 Heparin Dosing Weight: 105kg  Vital Signs: Temp: 97.8 F (36.6 C) (02/10 0800) Temp Source: Axillary (02/10 0800) BP: 122/73 (02/10 0800) Pulse Rate: 69 (02/10 0800)  Labs:  Recent Labs  05/27/16 1425 05/28/16 0357  05/28/16 1529 05/28/16 2149 05/29/16 0144 05/29/16 0924  HGB 12.6 12.8  --   --   --  12.8  --   HCT 35.8* 36.6  --   --   --  36.7  --   PLT 339 348  --   --   --  363  --   HEPARINUNFRC  --   --   < > 0.18* 0.24*  --  0.29*  CREATININE 0.74 0.74  --   --   --  0.71  --   < > = values in this interval not displayed.  Estimated Creatinine Clearance: 155 mL/min (by C-G formula based on SCr of 0.71 mg/dL).  Angelica Brown, PharmD PGY1 Pharmacy Resident [phone] (Pager) 05/29/2016 12:20 PM

## 2016-05-29 NOTE — Progress Notes (Signed)
PHARMACY - PHYSICIAN COMMUNICATION CRITICAL VALUE ALERT - BLOOD CULTURE IDENTIFICATION (BCID)  Results for orders placed or performed during the hospital encounter of 05/27/16  Blood Culture ID Panel (Reflexed) (Collected: 05/28/2016  3:57 AM)  Result Value Ref Range   Enterococcus species NOT DETECTED NOT DETECTED   Listeria monocytogenes NOT DETECTED NOT DETECTED   Staphylococcus species DETECTED (A) NOT DETECTED   Staphylococcus aureus NOT DETECTED NOT DETECTED   Methicillin resistance NOT DETECTED NOT DETECTED   Streptococcus species NOT DETECTED NOT DETECTED   Streptococcus agalactiae NOT DETECTED NOT DETECTED   Streptococcus pneumoniae NOT DETECTED NOT DETECTED   Streptococcus pyogenes NOT DETECTED NOT DETECTED   Acinetobacter baumannii NOT DETECTED NOT DETECTED   Enterobacteriaceae species NOT DETECTED NOT DETECTED   Enterobacter cloacae complex NOT DETECTED NOT DETECTED   Escherichia coli NOT DETECTED NOT DETECTED   Klebsiella oxytoca NOT DETECTED NOT DETECTED   Klebsiella pneumoniae NOT DETECTED NOT DETECTED   Proteus species NOT DETECTED NOT DETECTED   Serratia marcescens NOT DETECTED NOT DETECTED   Haemophilus influenzae NOT DETECTED NOT DETECTED   Neisseria meningitidis NOT DETECTED NOT DETECTED   Pseudomonas aeruginosa NOT DETECTED NOT DETECTED   Candida albicans NOT DETECTED NOT DETECTED   Candida glabrata NOT DETECTED NOT DETECTED   Candida krusei NOT DETECTED NOT DETECTED   Candida parapsilosis NOT DETECTED NOT DETECTED   Candida tropicalis NOT DETECTED NOT DETECTED    Name of physician (or Provider) Contacted: Howard Pouch, MC  Changes to prescribed antibiotics required: No changes needed. ID also following  Christoper Fabian, PharmD, BCPS Clinical pharmacist, pager [phone] 05/29/2016  4:32 AM

## 2016-05-29 NOTE — Progress Notes (Signed)
Patient ID: Angelica Brown, female   DOB: March 06, 1976, 41 y.o.   MRN: [id number]          [facility]  Date of Admission:  05/27/2016           Day 3 vancomycin        Day 2 ceftriaxone        Day 2 metronidazole  Principal Problem:   Dural sinus thrombosis Active Problems:   Mastoiditis of right side   . cefTRIAXone (ROCEPHIN)  IV  2 g Intravenous Q12H  . metroNIDAZOLE  500 mg Oral Q6H  .  morphine injection  2 mg Intravenous Q4H  . sodium chloride flush  3 mL Intravenous Q12H  . sodium chloride flush  3 mL Intravenous Q12H  . topiramate  25 mg Oral BID  . vancomycin  1,500 mg Intravenous Q8H    SUBJECTIVE: She had severe right-sided headache with stabbing pains behind her right ear last night. Her headache is much improved this morning. Her vision is normal now but she has had some transient episodes of blurred vision in the past 2 weeks. She has had some popping sensation in her right ear.  Review of Systems: Review of Systems  Constitutional: Positive for malaise/fatigue. Negative for chills, diaphoresis, fever and weight loss.  HENT: Positive for ear pain. Negative for sore throat.   Eyes: Positive for blurred vision.  Respiratory: Negative for cough, sputum production and shortness of breath.   Cardiovascular: Negative for chest pain.  Gastrointestinal: Negative for abdominal pain, diarrhea, heartburn, nausea and vomiting.  Musculoskeletal: Negative for joint pain and myalgias.  Skin: Negative for rash.  Neurological: Positive for headaches. Negative for dizziness, speech change and focal weakness.    Past Medical History:  Diagnosis Date  . CHOLELITHIASIS   . Dermatophytosis of nail   . 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 GERD (gastroesophageal reflux disease)   . Gestational diabetes    "w/all 4 pregnancies"  . Heart murmur   .  Hypertension   . Metrorrhagia   . Migraine    "at least twice/month" (05/28/2016)  . OBESITY, MORBID   . OBESITY, NOS   . Pneumonia 11/2014  . POSTURAL LIGHTHEADEDNESS   . Sickle cell trait (HCC)   . SYNCOPE   . Throat pain   . TOBACCO DEPENDENCE   . URI   . Urinary frequency     Social History  Substance Use Topics  . Smoking status: Current Every Day Smoker    Packs/day: 0.50    Years: 18.00    Types: Cigarettes  . Smokeless tobacco: Never Used  . Alcohol use No    Family History  Problem Relation Age of Onset  . Breast cancer Mother   . Cancer Father     MELANOMA  . Diabetes Father   . Diabetes Paternal Grandmother   . Diabetes Paternal Grandfather    Allergies  Allergen Reactions  . Ampicillin Shortness Of Breath    Throat swelling  . Prednisone Nausea And Vomiting and Other (See Comments)    Cramping in legs, could barely walk  . Strawberry Extract Itching and Swelling    OBJECTIVE: Vitals:   05/28/16 2044 05/29/16 0000 05/29/16 0400 05/29/16 0800  BP:  130/70 129/66 122/73  Pulse: 64  86 69  Resp: 15  12 19   Temp:  98.9 F (37.2 C) 98.7 F (37.1 C)  97.8 F (36.6 C)  TempSrc:  Oral Oral Axillary  SpO2:  99% 96%   Weight:      Height:       Body mass index is 52.29 kg/m.  Physical Exam  Constitutional: She is oriented to person, place, and time.  She is alert and comfortable sitting up in bed.  HENT:  Mouth/Throat: No oropharyngeal exudate.  No pain with palpation behind right ear.  Eyes: Conjunctivae are normal.  Cardiovascular: Normal rate and regular rhythm.   No murmur heard. Pulmonary/Chest: Effort normal and breath sounds normal. She has no wheezes. She has no rales.  Neurological: She is alert and oriented to person, place, and time.  Skin: No rash noted.  Psychiatric: Mood and affect normal.    Lab Results Lab Results  Component Value Date   WBC 18.5 (H) 05/29/2016   HGB 12.8 05/29/2016   HCT 36.7 05/29/2016   MCV 80.8  05/29/2016   PLT 363 05/29/2016    Lab Results  Component Value Date   CREATININE 0.71 05/29/2016   BUN 5 (L) 05/29/2016   NA 137 05/29/2016   K 3.5 05/29/2016   CL 100 (L) 05/29/2016   CO2 24 05/29/2016    Lab Results  Component Value Date   ALT 25 12/02/2015   AST 20 12/02/2015   ALKPHOS 66 12/02/2015   BILITOT 0.5 12/02/2015     Microbiology: Recent Results (from the past 240 hour(s))  Culture, blood (Routine X 2) w Reflex to ID Panel     Status: None (Preliminary result)   Collection Time: 05/28/16  3:50 AM  Result Value Ref Range Status   Specimen Description BLOOD RIGHT HAND  Final   Special Requests BOTTLES DRAWN AEROBIC ONLY  Final   Culture NO GROWTH 1 DAY  Final   Report Status PENDING  Incomplete  Culture, blood (Routine X 2) w Reflex to ID Panel     Status: None (Preliminary result)   Collection Time: 05/28/16  3:57 AM  Result Value Ref Range Status   Specimen Description BLOOD LEFT HAND  Final   Special Requests IN PEDIATRIC BOTTLE  Final   Culture  Setup Time   Final    GRAM POSITIVE COCCI IN CLUSTERS IN PEDIATRIC BOTTLE Organism ID to follow CRITICAL RESULT CALLED TO, READ BACK BY AND VERIFIED WITH: CARON AMEND, PHARMD @0425  05/29/16 MKELLY,MLT    Culture NO GROWTH 1 DAY  Final   Report Status PENDING  Incomplete  Blood Culture ID Panel (Reflexed)     Status: Abnormal   Collection Time: 05/28/16  3:57 AM  Result Value Ref Range Status   Enterococcus species NOT DETECTED NOT DETECTED Final   Listeria monocytogenes NOT DETECTED NOT DETECTED Final   Staphylococcus species DETECTED (A) NOT DETECTED Final    Comment: Methicillin (oxacillin) susceptible coagulase negative staphylococcus. Possible blood culture contaminant (unless isolated from more than one blood culture draw or clinical case suggests pathogenicity). No antibiotic treatment is indicated for blood  culture contaminants. CRITICAL RESULT CALLED TO, READ BACK BY AND VERIFIED WITH: CARON  AMEND, PHARMD @0425  05/29/16 MKELLY,MLT    Staphylococcus aureus NOT DETECTED NOT DETECTED Final   Methicillin resistance NOT DETECTED NOT DETECTED Final   Streptococcus species NOT DETECTED NOT DETECTED Final   Streptococcus agalactiae NOT DETECTED NOT DETECTED Final   Streptococcus pneumoniae NOT DETECTED NOT DETECTED Final   Streptococcus pyogenes NOT DETECTED NOT DETECTED Final   Acinetobacter baumannii NOT DETECTED NOT DETECTED Final  Enterobacteriaceae species NOT DETECTED NOT DETECTED Final   Enterobacter cloacae complex NOT DETECTED NOT DETECTED Final   Escherichia coli NOT DETECTED NOT DETECTED Final   Klebsiella oxytoca NOT DETECTED NOT DETECTED Final   Klebsiella pneumoniae NOT DETECTED NOT DETECTED Final   Proteus species NOT DETECTED NOT DETECTED Final   Serratia marcescens NOT DETECTED NOT DETECTED Final   Haemophilus influenzae NOT DETECTED NOT DETECTED Final   Neisseria meningitidis NOT DETECTED NOT DETECTED Final   Pseudomonas aeruginosa NOT DETECTED NOT DETECTED Final   Candida albicans NOT DETECTED NOT DETECTED Final   Candida glabrata NOT DETECTED NOT DETECTED Final   Candida krusei NOT DETECTED NOT DETECTED Final   Candida parapsilosis NOT DETECTED NOT DETECTED Final   Candida tropicalis NOT DETECTED NOT DETECTED Final  MRSA PCR Screening     Status: None   Collection Time: 05/28/16  6:49 PM  Result Value Ref Range Status   MRSA by PCR NEGATIVE NEGATIVE Final    Comment:        The GeneXpert MRSA Assay (FDA approved for NASAL specimens only), is one component of a comprehensive MRSA colonization surveillance program. It is not intended to diagnose MRSA infection nor to guide or monitor treatment for MRSA infections.      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 One of 2 blood cultures is growing coagulase-negative staph. Suspect this is an  insignificant contaminant. I will plan on a minimum of 3 weeks of intravenous antibiotics along with anticoagulation.  PLAN: 1. Continue current antibiotics 2. Await final blood culture results 3. PICC placement 4. We will follow-up on Monday, 05/31/2016  Cliffton Asters, MD [facility] [phone] pager   [phone] cell 05/29/2016, 11:46 AM

## 2016-05-29 NOTE — Progress Notes (Addendum)
Subjective: Interval History:  Pt reports headache is improved today but did have a bad headache last night. She recaps her headaches history for me and reports having headaches since summer of 2016 when she had MRI and it was negative. She then had been following PCP for headaches but nothing was working. She then went to see Opthalmology per PCP recommendations and was found to have papilledema. She was then sent to ER for evaluation. She is found to have   Objective: Vital signs in last 24 hours: Temp:  [97.8 F (36.6 C)-99.5 F (37.5 C)] 97.8 F (36.6 C) (02/10 0800) Pulse Rate:  [50-86] 69 (02/10 0800) Resp:  [12-21] 19 (02/10 0800) BP: (100-134)/(58-82) 122/73 (02/10 0800) SpO2:  [95 %-99 %] 96 % (02/10 0400) Weight:  [160.6 kg (354 lb 1.6 oz)] 160.6 kg (354 lb 1.6 oz) (02/09 1900)  Intake/Output from previous day: 02/09 0701 - 02/10 0700 In: 3008.2 [P.O.:360; I.V.:498.2; IV Piggyback:2150] Out: -  Intake/Output this shift: No intake/output data recorded. Nutritional status: Diet regular Room service appropriate? Yes; Fluid consistency: Thin  HEENT-  Normocephalic, no lesions, Neck supple and no rigidity was appreciated. Cardiovascular - regular rate and rhythm, S1, S2 normal, no murmur, click, rub or gallop Lungs - chest clear, no wheezing, rales, normal symmetric air entry, Heart exam - S1, S2 normal, no murmur, no gallop, rate regular Abdomen - soft nontender and nondistended  Neurologic Examination: Mental status: Awake alert and oriented to all spheres. Speech and language: No evidence of dysarthria was appreciated. No evidence of aphasia was noted. Cranial nerves: Pupils approximately 1 mm and sluggishly reactive to light. Fundus exam showed grade I papilledema in right eye. Difficult exam in left eye. Extra muscles intact. Facial sensation symmetric. No facial droop is appreciated. Hearing intact. Uvula midline. Tongue midline. Motor: 5/5 throughout Sensory: Normal  sensation to light touch Coordination: Normal finger to nose. Gait: Deferred  Lab Results:  Recent Labs  05/28/16 0357 05/29/16 0144  WBC 16.6* 18.5*  HGB 12.8 12.8  HCT 36.6 36.7  PLT 348 363  NA 137 137  K 3.6 3.5  CL 101 100*  CO2 24 24  GLUCOSE 111* 98  BUN 6 5*  CREATININE 0.74 0.71  CALCIUM 9.1 9.0   Lipid Panel No results for input(s): CHOL, TRIG, HDL, CHOLHDL, VLDL, LDLCALC in the last 72 hours.  Studies/Results: Ct Head Wo Contrast  Result Date: 05/27/2016 CLINICAL DATA:  Headache with vomiting EXAM: CT HEAD WITHOUT CONTRAST TECHNIQUE: Contiguous axial images were obtained from the base of the skull through the vertex without intravenous contrast. COMPARISON:  MRI 12/09/2015, CT brain 11/23/2014 FINDINGS: Brain: No acute territorial infarction or hemorrhage is visualized. No focal mass, mass effect or midline shift. Ventricles nonenlarged. Vascular: Mildly hyperdense dural venous sinuses. No hyperdense arteries. No unexpected calcifications. Skull: Normal. Negative for fracture or focal lesion. Fluid in the inferior mastoids. Sinuses/Orbits: Mild mucosal thickening in the ethmoid sinuses. No acute orbital abnormality. Other: None IMPRESSION: 1. No hemorrhage, territorial infarct or mass is visualized. 2. Slight increased density of the dural venous sinuses, could be related to slow flow or possible dehydration. If venous thrombosis is suspected, further evaluation with MRI could be obtained. Electronically Signed   By: Jasmine Pang M.D.   On: 05/27/2016 22:11   Mr Laqueta Jean And Wo Contrast  Result Date: 05/28/2016 CLINICAL DATA:  41 y/o F; suspected idiopathic intracranial hypertension with sudden onset headache. EXAM: MRI ORBITS WITHOUT AND WITH CONTRAST MRI HEAD  WITHOUT AND WITH CONTRAST MRI VENOGRAM WITHOUT CONTRAST TECHNIQUE: Multiplanar, multiecho pulse sequences of the brain and surrounding structures were obtained without and with intravenous contrast. Multiplanar,  multiecho pulse sequences of the orbits and surrounding structures were obtained including fat saturation techniques, before and after intravenous contrast administration. MR venogram was performed without intravenous contrast. CONTRAST:  20 cc MultiHance COMPARISON:  05/27/2016 CT head FINDINGS: MRI HEAD FINDINGS Brain: No acute infarction, hemorrhage, hydrocephalus, extra-axial collection or mass lesion. No abnormal enhancement of brain parenchyma. Mild diffuse smooth pachymeningeal enhancement. Stable 2 mm tonsillar ectasia. Vascular: Increased T1 signal and loss of flow void on of superior sagittal sinus, right transverse sinus, right sigmoid sinus, and right upper internal jugular vein. Normal signal within the straight sinus, and left transverse sinus to upper internal jugular vein. Normal proximal arterial flow voids. Skull and upper cervical spine: Normal marrow signal. Other: None. MRI ORBITS FINDINGS Orbits: No traumatic or inflammatory finding. Globes, optic nerves, orbital fat, extraocular muscles, vascular structures, and lacrimal glands are normal. Bilateral dural ectasia of the optic nerve sheaths. Visualized sinuses: Right mastoid effusion. No abnormal signal of left mastoid air cells. Mucosal thickening of anterior ethmoid air cells and of the right frontal sinus. Soft tissues: Negative. Limited intracranial: As above. MRI VENOGRAM FINDINGS Absent flow related signal within the mid and posterosuperior sagittal sinus, right transverse sinus, right sigmoid sinus, and right upper internal jugular vein. Normal flow related signal within the straight sinus, internal cerebral veins, left transverse sinus, left sigmoid sinus, and left upper internal jugular vein. There are enlarged cortical veins likely resultant from collateral circulation. The anterior superior sagittal sinus demonstrates normal flow related signal. IMPRESSION: 1. Acute dural venous sinus thrombosis of the mid and posterior segments of  the superior sagittal sinus, right transverse sinus, right sigmoid sinus, and right upper internal jugular vein. No hemorrhage or acute infarction of the brain. 2. Diffuse smooth pachymeningeal enhancement is likely related to dural venous sinus thrombosis. Meningeal infection/inflammation or intracranial hypotension is considered less likely. 3. Right mastoid effusion. No associate extra or intracranial abscess identified. 4. Bilateral optic nerve sheath dural ectasia is nonspecific which can be seen with idiopathic intracranial hypertension. These results were called by telephone at the time of interpretation on 05/28/2016 at 1:25 am to Dr. Earley Favor , who verbally acknowledged these results. Electronically Signed   By: Mitzi Hansen M.D.   On: 05/28/2016 01:30   Mr Susie Cassette Head  Result Date: 05/28/2016 CLINICAL DATA:  41 y/o F; suspected idiopathic intracranial hypertension with sudden onset headache. EXAM: MRI ORBITS WITHOUT AND WITH CONTRAST MRI HEAD WITHOUT AND WITH CONTRAST MRI VENOGRAM WITHOUT CONTRAST TECHNIQUE: Multiplanar, multiecho pulse sequences of the brain and surrounding structures were obtained without and with intravenous contrast. Multiplanar, multiecho pulse sequences of the orbits and surrounding structures were obtained including fat saturation techniques, before and after intravenous contrast administration. MR venogram was performed without intravenous contrast. CONTRAST:  20 cc MultiHance COMPARISON:  05/27/2016 CT head FINDINGS: MRI HEAD FINDINGS Brain: No acute infarction, hemorrhage, hydrocephalus, extra-axial collection or mass lesion. No abnormal enhancement of brain parenchyma. Mild diffuse smooth pachymeningeal enhancement. Stable 2 mm tonsillar ectasia. Vascular: Increased T1 signal and loss of flow void on of superior sagittal sinus, right transverse sinus, right sigmoid sinus, and right upper internal jugular vein. Normal signal within the straight sinus, and left  transverse sinus to upper internal jugular vein. Normal proximal arterial flow voids. Skull and upper cervical spine: Normal marrow signal. Other: None.  MRI ORBITS FINDINGS Orbits: No traumatic or inflammatory finding. Globes, optic nerves, orbital fat, extraocular muscles, vascular structures, and lacrimal glands are normal. Bilateral dural ectasia of the optic nerve sheaths. Visualized sinuses: Right mastoid effusion. No abnormal signal of left mastoid air cells. Mucosal thickening of anterior ethmoid air cells and of the right frontal sinus. Soft tissues: Negative. Limited intracranial: As above. MRI VENOGRAM FINDINGS Absent flow related signal within the mid and posterosuperior sagittal sinus, right transverse sinus, right sigmoid sinus, and right upper internal jugular vein. Normal flow related signal within the straight sinus, internal cerebral veins, left transverse sinus, left sigmoid sinus, and left upper internal jugular vein. There are enlarged cortical veins likely resultant from collateral circulation. The anterior superior sagittal sinus demonstrates normal flow related signal. IMPRESSION: 1. Acute dural venous sinus thrombosis of the mid and posterior segments of the superior sagittal sinus, right transverse sinus, right sigmoid sinus, and right upper internal jugular vein. No hemorrhage or acute infarction of the brain. 2. Diffuse smooth pachymeningeal enhancement is likely related to dural venous sinus thrombosis. Meningeal infection/inflammation or intracranial hypotension is considered less likely. 3. Right mastoid effusion. No associate extra or intracranial abscess identified. 4. Bilateral optic nerve sheath dural ectasia is nonspecific which can be seen with idiopathic intracranial hypertension. These results were called by telephone at the time of interpretation on 05/28/2016 at 1:25 am to Dr. Earley Favor , who verbally acknowledged these results. Electronically Signed   By: Mitzi Hansen M.D.   On: 05/28/2016 01:30   Mr Rockwell [place] ZO Contrast  Result Date: 05/28/2016 CLINICAL DATA:  41 y/o F; suspected idiopathic intracranial hypertension with sudden onset headache. EXAM: MRI ORBITS WITHOUT AND WITH CONTRAST MRI HEAD WITHOUT AND WITH CONTRAST MRI VENOGRAM WITHOUT CONTRAST TECHNIQUE: Multiplanar, multiecho pulse sequences of the brain and surrounding structures were obtained without and with intravenous contrast. Multiplanar, multiecho pulse sequences of the orbits and surrounding structures were obtained including fat saturation techniques, before and after intravenous contrast administration. MR venogram was performed without intravenous contrast. CONTRAST:  20 cc MultiHance COMPARISON:  05/27/2016 CT head FINDINGS: MRI HEAD FINDINGS Brain: No acute infarction, hemorrhage, hydrocephalus, extra-axial collection or mass lesion. No abnormal enhancement of brain parenchyma. Mild diffuse smooth pachymeningeal enhancement. Stable 2 mm tonsillar ectasia. Vascular: Increased T1 signal and loss of flow void on of superior sagittal sinus, right transverse sinus, right sigmoid sinus, and right upper internal jugular vein. Normal signal within the straight sinus, and left transverse sinus to upper internal jugular vein. Normal proximal arterial flow voids. Skull and upper cervical spine: Normal marrow signal. Other: None. MRI ORBITS FINDINGS Orbits: No traumatic or inflammatory finding. Globes, optic nerves, orbital fat, extraocular muscles, vascular structures, and lacrimal glands are normal. Bilateral dural ectasia of the optic nerve sheaths. Visualized sinuses: Right mastoid effusion. No abnormal signal of left mastoid air cells. Mucosal thickening of anterior ethmoid air cells and of the right frontal sinus. Soft tissues: Negative. Limited intracranial: As above. MRI VENOGRAM FINDINGS Absent flow related signal within the mid and posterosuperior sagittal sinus, right transverse sinus,  right sigmoid sinus, and right upper internal jugular vein. Normal flow related signal within the straight sinus, internal cerebral veins, left transverse sinus, left sigmoid sinus, and left upper internal jugular vein. There are enlarged cortical veins likely resultant from collateral circulation. The anterior superior sagittal sinus demonstrates normal flow related signal. IMPRESSION: 1. Acute dural venous sinus thrombosis of the mid and posterior segments of the superior sagittal sinus,  right transverse sinus, right sigmoid sinus, and right upper internal jugular vein. No hemorrhage or acute infarction of the brain. 2. Diffuse smooth pachymeningeal enhancement is likely related to dural venous sinus thrombosis. Meningeal infection/inflammation or intracranial hypotension is considered less likely. 3. Right mastoid effusion. No associate extra or intracranial abscess identified. 4. Bilateral optic nerve sheath dural ectasia is nonspecific which can be seen with idiopathic intracranial hypertension. These results were called by telephone at the time of interpretation on 05/28/2016 at 1:25 am to Dr. Earley Favor , who verbally acknowledged these results. Electronically Signed   By: Mitzi Hansen M.D.   On: 05/28/2016 01:30    Medications:  Scheduled: . cefTRIAXone (ROCEPHIN)  IV  2 g Intravenous Q12H  . metroNIDAZOLE  500 mg Oral Q6H  .  morphine injection  2 mg Intravenous Q4H  . sodium chloride flush  3 mL Intravenous Q12H  . sodium chloride flush  3 mL Intravenous Q12H  . vancomycin  1,500 mg Intravenous Q8H   Continuous: . heparin 2,100 Units/hr (05/29/16 4403)   KVQ:QVZDGL chloride, acetaminophen, morphine injection, ondansetron (ZOFRAN) IV, sodium chloride flush  Assessment/Plan: 41 years old AAF with no prior history of clotting disorder however does have strong family history of clotting disorder. She presented with chronic daily headaches and found to have superior sagittal sinus  thrombosis with right transverse sinus an right IJ thrombus. She is currently on IV heparin and IV abx.  -Cont with IV heparin drip and may transition to oral anticoagulants such as coumadin or Xaralto or Eliquis for 3 months. -I think Topamax can be very beneficial in this setting in reducing some ICP and also for weight loss. I have discussed with patient regarding side effects including teratogenic effects and she is agreeable to start this. Other side effects such as parasthesia, renal stones, and glaucoma was discussed with patient as well. -Topamax dose will be 25mg  BID intially and after one week 25mg  QAM and 50mg  QPM and after another week 50mg  BID. -Recommend urgent imaging if neurological exam changes. At least a CT head prior to discharge to rule out any hemorrhage.   Neurology will follow along. Please call with questions.   LOS: 1 day   Luan Pulling

## 2016-05-29 NOTE — Plan of Care (Signed)
Problem: Education: Goal: Knowledge of [facility] Education information/materials will improve Outcome: Progressing Patient oriented to unit and hospital. Discussed plan of care with patient. Educated her on therapeutic levels of heparin. Patient aware how to rate pain and how to call for assistance/ pain medication.   Problem: Pain Managment: Goal: General experience of comfort will improve Outcome: Progressing Patient did not have a good night in pain control. Discussed options with night resident several times. Morphine not effective for patient's pain. Patient extremely uncomfortable. States morphine helps her sleep for a half hour and then she is hurting again. Consistently rating pain at 10/10. Pt finally able to get pain relief bringing her pain down to 2/10 with addition of toradol.

## 2016-05-30 DIAGNOSIS — G08 Intracranial and intraspinal phlebitis and thrombophlebitis: Principal | ICD-10-CM

## 2016-05-30 LAB — VANCOMYCIN, TROUGH: VANCOMYCIN TR: 17 ug/mL (ref 15–20)

## 2016-05-30 LAB — CBC
HCT: 33.7 % — ABNORMAL LOW (ref 36.0–46.0)
HEMOGLOBIN: 11.7 g/dL — AB (ref 12.0–15.0)
MCH: 27.9 pg (ref 26.0–34.0)
MCHC: 34.7 g/dL (ref 30.0–36.0)
MCV: 80.4 fL (ref 78.0–100.0)
Platelets: 340 10*3/uL (ref 150–400)
RBC: 4.19 MIL/uL (ref 3.87–5.11)
RDW: 15 % (ref 11.5–15.5)
WBC: 16.5 10*3/uL — ABNORMAL HIGH (ref 4.0–10.5)

## 2016-05-30 LAB — CULTURE, BLOOD (ROUTINE X 2)

## 2016-05-30 LAB — HEPARIN LEVEL (UNFRACTIONATED): HEPARIN UNFRACTIONATED: 0.34 [IU]/mL (ref 0.30–0.70)

## 2016-05-30 MED ORDER — HYDROMORPHONE HCL 1 MG/ML IJ SOLN
0.5000 mg | INTRAMUSCULAR | Status: DC | PRN
  Administered 2016-05-30 – 2016-05-31 (×4): 0.5 mg via INTRAVENOUS
  Filled 2016-05-30 (×4): qty 1

## 2016-05-30 MED ORDER — RIVAROXABAN 15 MG PO TABS
15.0000 mg | ORAL_TABLET | Freq: Two times a day (BID) | ORAL | Status: DC
  Administered 2016-05-30 – 2016-06-02 (×8): 15 mg via ORAL
  Filled 2016-05-30 (×8): qty 1

## 2016-05-30 MED ORDER — RIVAROXABAN 20 MG PO TABS: 20.0000 mg | ORAL_TABLET | Freq: Every day | ORAL | Status: DC

## 2016-05-30 MED ORDER — TRAMADOL HCL 50 MG PO TABS: 50.0000 mg | ORAL_TABLET | Freq: Four times a day (QID) | ORAL | Status: DC | PRN

## 2016-05-30 MED ORDER — KETOROLAC TROMETHAMINE 15 MG/ML IJ SOLN: 15.0000 mg | Freq: Once | INTRAMUSCULAR | Status: DC

## 2016-05-30 MED ORDER — TRAMADOL HCL 50 MG PO TABS
100.0000 mg | ORAL_TABLET | Freq: Four times a day (QID) | ORAL | Status: DC | PRN
  Administered 2016-05-30 – 2016-05-31 (×3): 100 mg via ORAL
  Filled 2016-05-30 (×3): qty 2

## 2016-05-30 NOTE — Progress Notes (Signed)
ANTICOAGULATION CONSULT NOTE  Pharmacy Consult for Heparin >> Xarelto Indication: sinus venous thrombosis  Assessment: 41yo female with sinus venous thrombosis seen on imaging. Patient was started on heparin WITHOUT bolus at lower goal at ED provider's request. Per family medicine, OK to target normal heparin goal of 0.3-0.7 given no intracranial bleed. Pharmacy is now consulted to switch patient to Xarelto. Heparin level is therapeutic at 0.34 on 2400 units/hr. Slight drop in hemoglobin, platelets wnl, renal function stable (CrCl >90 ml/min).   Goal of Therapy:  Monitor platelets by anticoagulation protocol: Yes   Plan:  Discontinue heparin infusion Start Xarelto 15mg  BID for 21 days, followed by 20mg  daily with largest meal Monitor CBC and s/sx's of bleeding   Allergies  Allergen Reactions  . Ampicillin Shortness Of Breath    Throat swelling  . Prednisone Nausea And Vomiting and Other (See Comments)    Cramping in legs, could barely walk  . Strawberry Extract Itching and Swelling    Patient Measurements: Height: 5\' 9"  (175.3 cm) Weight: (!) 354 lb 1.6 oz (160.6 kg) IBW/kg (Calculated) : 66.2 Heparin Dosing Weight: 105kg  Vital Signs: Temp: 98.1 F (36.7 C) (02/11 0400) Temp Source: Oral (02/11 0400) BP: 109/72 (02/11 0400) Pulse Rate: 68 (02/11 0400)  Labs:  Recent Labs  05/27/16 1425 05/28/16 0357  05/29/16 0144 05/29/16 0924 05/29/16 1850 05/30/16 0325  HGB 12.6 12.8  --  12.8  --   --  11.7*  HCT 35.8* 36.6  --  36.7  --   --  33.7*  PLT 339 348  --  363  --   --  340  HEPARINUNFRC  --   --   < >  --  0.29* 0.53 0.34  CREATININE 0.74 0.74  --  0.71  --   --   --   < > = values in this interval not displayed.  Estimated Creatinine Clearance: 155 mL/min (by C-G formula based on SCr of 0.71 mg/dL).   Allie Bossier, PharmD PGY1 Pharmacy Resident MS3 [phone] 05/30/2016 7:32 AM

## 2016-05-30 NOTE — Progress Notes (Addendum)
Subjective: HA improving--still has increased HA when laying down.   Exam: Vitals:   05/30/16 0400 05/30/16 0740  BP: 109/72 (!) 125/93  Pulse: 68 66  Resp:  (!) 27  Temp: 98.1 F (36.7 C) 98 F (36.7 C)        Gen: In bed, NAD Mental status: Awake alert and oriented to all spheres. Speech and language: No evidence of dysarthria was appreciated. No evidence of aphasia was noted. Cranial nerves: Pupils approximately 1 mm and sluggishly reactive to light. Fundus exam showed grade I papilledema in right eye. Difficult exam in left eye. Extra muscles intact. Facial sensation symmetric. No facial droop is appreciated. Hearing intact. Uvula midline. Tongue midline. Motor: 5/5 throughout Sensory: Normal sensation to light touch Coordination: Normal finger to nose. Gait: Deferred  Pertinent Labs/Diagnostics: none    Impression: 41 years old AAF with no prior history of clotting disorder however does have strong family history of clotting disorder. 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 She is currently on IV heparin and IV abx.   Recommendations: 1)-Cont with IV heparin drip and may transition to oral anticoagulants such as coumadin or Xaralto or Eliquis for 3 months 2)-Continue Topamax 3)-Topamax dose will be 25mg  BID intially and after one week 25mg  QAM and 50mg  QPM and after another week 50mg  BID. 4)--Recommend urgent imaging if neurological exam changes. At least a CT head prior to discharge to rule out any hemorrhage.  Neurology will S/O  Felicie Morn PA-C [facility] [phone] 05/30/2016, 10:12 AM

## 2016-05-30 NOTE — Plan of Care (Signed)
Problem: Education: Goal: Knowledge of [facility] Education information/materials will improve Outcome: Progressing Discussed plan of care with patient. She is aware she will go home with antibiotics and will need a PICC line placed for this.   Problem: Pain Managment: Goal: General experience of comfort will improve Outcome: Progressing Patient with good pain control during day shift; however, on night shift this changes. Patient experiences increased unmanageable pain when she tries to recline/ lay down in bed. Pain located in right ear and in front between her eyes.  Morphine is unable to help with this. Patient readjusted to upright position, but has been unable to sleep due to this. Will pass information to day shift and patient plans to discuss with doctors this AM.

## 2016-05-30 NOTE — Progress Notes (Signed)
Spoke with pt at length regarding PICC line placement.  Pt very pleasant and cooperative, but tearful.  Verbalizes feeling overwhelmed and anxious regarding hospitalization, diagnosis and PICC procedure.  States is uncomfortable proceeding with PICC until speaks to Dr Orvan Falconer regarding dx.   Requests to wait until tomorrow to place PICC.

## 2016-05-30 NOTE — Progress Notes (Signed)
Family Medicine Teaching Service Daily Progress Note Intern Pager: [phone]  Patient name: Angelica Brown Medical record number: [id number] Date of birth: 1975/09/04 Age: 41 y.o. Gender: female  Primary Care Provider: Levert Feinstein, MD Consultants: Neurology, ENT, ID Code Status: Full  Pt Overview and Major Events to Date:  2/9 - Admitted with dural venous sinus thrombosis  Assessment and Plan: Angelica Brown is a 41 y.o. female presenting with intractable headaches, found to have acute dural venous sinus thrombosis . PMH is significant for migraine, tobacco use, cholelithiasis  Acute dural venous sinus thrombosis - Confirmed on CT and MRI. Unclear inciting factor. Possible mastoiditis may be contributing. Not on hormonal therapy. Pregnancy test negative. Nonfocal neuro exam. - Transition from heparin to xarelto today - morphine 2 mg q6hPRN pain - Tramadol 50mg  q6hrs prn - Zofran prn nausea - Low threshold to rescan head if symptoms worsen, repeat prior to D/C per neuro recs - Monitor CBC  Possible right mastoiditis - Effusion noted on MRI. Patient has shooting pain in mastoid region, but no other clinical signs.  - Vanc/CTX/Flagyl per ID recs - Will need 3 weeks of IV antibiotics per ID - PICC team consulted - ENT consulted - no need for surgical intervention at this time, will follow up outpatient.  - BCx - coag neg staph in 1/2 bottles, likely contaminant   Chronic Migraines - Start topamax 25mg  bid per neuro recs  Tobacco use - 11 pack/year tobacco history - tobacco cessation counseling when appropriate  FEN/GI: Regular diet Prophylaxis: Full dose heparin  Disposition: Admitted pending above management.   Subjective:  Headache somewhat worse this morning. No weakness, numbness, or paresthesias.   Objective: Temp:  [97.8 F (36.6 C)-98.7 F (37.1 C)] 98.1 F (36.7 C) (02/11 0400) Pulse Rate:  [55-73] 68 (02/11 0400) Resp:  [2-21] 21 (02/11  0000) BP: (109-148)/(63-96) 109/72 (02/11 0400) SpO2:  [95 %-100 %] 95 % (02/11 0400) Physical Exam: General: 40yo female in NAD resting comfortably in bed HEENT: No mastoid tenderness.  Cardiovascular: RRR, no murmurs, distant heart sounds Respiratory: NWOB, CTAB Abdomen: Obese, S, NT, ND Extremities: No cyanosis Neuro: CN2-12 grossly intact. Strength 5/5 in upper and lower extremities. Sensation to light touch grossly intact.   Laboratory:  Recent Labs Lab 05/28/16 0357 05/29/16 0144 05/30/16 0325  WBC 16.6* 18.5* 16.5*  HGB 12.8 12.8 11.7*  HCT 36.6 36.7 33.7*  PLT 348 363 340    Recent Labs Lab 05/27/16 1425 05/28/16 0357 05/29/16 0144  NA 141 137 137  K 3.3* 3.6 3.5  CL 102 101 100*  CO2 30 24 24   BUN 7 6 5*  CREATININE 0.74 0.74 0.71  CALCIUM 9.2 9.1 9.0  GLUCOSE 100* 111* 98   Imaging/Diagnostic Tests: None New  Ardith Dark, MD 05/30/2016, 7:04 AM PGY-3, [facility] FPTS Intern pager: [phone], text pages welcome

## 2016-05-30 NOTE — Progress Notes (Signed)
Pharmacy Antibiotic Note  Angelica Brown is a 41 y.o. female admitted on 05/27/2016 with suspected mastoiditis. Pharmacy has been consulted for Vancomycin dosing. Patient is currently afebrile with decreasing wbc (16.5) and stable renal function. Today, the vancomycin trough is 17 mcg/ml, which is in goal of 15-20.   Plan: Vancomycin 1500mg  IV q8h Will f/u micro data, renal function, and pt's clinical condition Vanc trough as needed   Height: 5\' 9"  (175.3 cm) Weight: (!) 354 lb 1.6 oz (160.6 kg) IBW/kg (Calculated) : 66.2  Temp (24hrs), Avg:98.1 F (36.7 C), Min:97.4 F (36.3 C), Max:98.7 F (37.1 C)   Recent Labs Lab 05/27/16 1425 05/28/16 0357 05/29/16 0144 05/30/16 0325 05/30/16 1338  WBC 14.6* 16.6* 18.5* 16.5*  --   CREATININE 0.74 0.74 0.71  --   --   VANCOTROUGH  --   --   --   --  17    Estimated Creatinine Clearance: 155 mL/min (by C-G formula based on SCr of 0.71 mg/dL).    Allergies  Allergen Reactions  . Ampicillin Shortness Of Breath    Throat swelling  . Prednisone Nausea And Vomiting and Other (See Comments)    Cramping in legs, could barely walk  . Strawberry Extract Itching and Swelling    Antimicrobials this admission: 2/9 Zosyn >> 2/10 2/9 Vanc >> 2/10 CTX >>  2/10 Flagyl >>  Dose adjustments this admission: 2/11 VT: 17 on 1500 mg q8h  Microbiology results: 2/9 BCx x2: 1/2 GPC in clusters (BCID: 1/2 staph (no mecA)) - could be contaminant  Thank you for allowing pharmacy to be a part of this patient's care.  Allie Bossier, PharmD PGY1 Pharmacy Resident 05/30/2016 2:42 PM

## 2016-05-31 ENCOUNTER — Other Ambulatory Visit (HOSPITAL_COMMUNITY): Payer: Commercial Managed Care - HMO

## 2016-05-31 ENCOUNTER — Ambulatory Visit: Payer: Commercial Managed Care - HMO | Admitting: Neurology

## 2016-05-31 ENCOUNTER — Inpatient Hospital Stay (HOSPITAL_COMMUNITY): Payer: Commercial Managed Care - HMO

## 2016-05-31 DIAGNOSIS — R7881 Bacteremia: Secondary | ICD-10-CM

## 2016-05-31 DIAGNOSIS — H7091 Unspecified mastoiditis, right ear: Secondary | ICD-10-CM

## 2016-05-31 DIAGNOSIS — R001 Bradycardia, unspecified: Secondary | ICD-10-CM

## 2016-05-31 LAB — BASIC METABOLIC PANEL
ANION GAP: 9 (ref 5–15)
BUN: <5 mg/dL — ABNORMAL LOW (ref 6–20)
CO2: 25 mmol/L (ref 22–32)
Calcium: 9.2 mg/dL (ref 8.9–10.3)
Chloride: 103 mmol/L (ref 101–111)
Creatinine, Ser: 0.71 mg/dL (ref 0.44–1.00)
GFR calc Af Amer: >60 mL/min (ref >60)
GFR calc non Af Amer: >60 mL/min (ref >60)
GLUCOSE: 96 mg/dL (ref 65–99)
POTASSIUM: 3.7 mmol/L (ref 3.5–5.1)
Sodium: 137 mmol/L (ref 135–145)

## 2016-05-31 LAB — CBC
HCT: 35 % — ABNORMAL LOW (ref 36.0–46.0)
HEMOGLOBIN: 12.1 g/dL (ref 12.0–15.0)
MCH: 27.7 pg (ref 26.0–34.0)
MCHC: 34.6 g/dL (ref 30.0–36.0)
MCV: 80.1 fL (ref 78.0–100.0)
PLATELETS: 360 10*3/uL (ref 150–400)
RBC: 4.37 MIL/uL (ref 3.87–5.11)
RDW: 15 % (ref 11.5–15.5)
WBC: 15.6 10*3/uL — ABNORMAL HIGH (ref 4.0–10.5)

## 2016-05-31 LAB — MAGNESIUM: Magnesium: 2.4 mg/dL (ref 1.7–2.4)

## 2016-05-31 MED ORDER — OXYCODONE HCL 5 MG PO TABS
5.0000 mg | ORAL_TABLET | ORAL | Status: DC | PRN
Start: 2016-05-31 — End: 2016-06-02
  Administered 2016-06-01 – 2016-06-02 (×6): 5 mg via ORAL
  Filled 2016-05-31 (×6): qty 1

## 2016-05-31 MED ORDER — SODIUM CHLORIDE 0.9% FLUSH
10.0000 mL | INTRAVENOUS | Status: DC | PRN
  Administered 2016-06-02 (×2): 10 mL
  Filled 2016-05-31 (×2): qty 40

## 2016-05-31 MED ORDER — POLYETHYLENE GLYCOL 3350 17 G PO PACK: 17.0000 g | PACK | Freq: Every day | ORAL | Status: DC | PRN

## 2016-05-31 MED ORDER — SODIUM CHLORIDE 0.9% FLUSH
10.0000 mL | Freq: Two times a day (BID) | INTRAVENOUS | Status: DC
  Administered 2016-05-31 (×2): 10 mL

## 2016-05-31 MED ORDER — DOCUSATE SODIUM 100 MG PO CAPS
100.0000 mg | ORAL_CAPSULE | Freq: Every day | ORAL | Status: DC
  Administered 2016-06-01: 100 mg via ORAL
  Filled 2016-05-31 (×2): qty 1

## 2016-05-31 NOTE — Progress Notes (Signed)
Notified by CCMD that patient's HR was in new rhythm. She was SB as low as 38. Had a junctional beat and then PVC's. Patient then went into Ventricular Bigeminy for 3-4 minutes. FMTS informed of change. Patient was asleep during incident. Patient HR now returned to SB/NSR.   Noe Gens, RN

## 2016-05-31 NOTE — Progress Notes (Signed)
Peripherally Inserted Central Catheter/Midline Placement  The IV Nurse has discussed with the patient and/or persons authorized to consent for the patient, the purpose of this procedure and the potential benefits and risks involved with this procedure.  The benefits include less needle sticks, lab draws from the catheter, and the patient may be discharged home with the catheter. Risks include, but not limited to, infection, bleeding, blood clot (thrombus formation), and puncture of an artery; nerve damage and irregular heartbeat and possibility to perform a PICC exchange if needed/ordered by physician.  Alternatives to this procedure were also discussed.  Bard Power PICC patient education guide, fact sheet on infection prevention and patient information card has been provided to patient /or left at bedside.    PICC/Midline Placement Documentation        Maximino Greenland 05/31/2016, 1:36 PM

## 2016-05-31 NOTE — Progress Notes (Signed)
Transitions of Care Pharmacy Note  Plan:  Educated on titration/side effects of topamax, LOT for ABX, LOT xarelto - xarelto teaching completed by Allie Bossier, PharmD previously.  Addressed concerns regarding need for PICC line Follow-up tolerance of topamax --------------------------------------------- Angelica Brown is an 41 y.o. female who presents with a chief complaint headache found to have sinus venous thrombus and mastoiditis. In anticipation of discharge, pharmacy has reviewed this patient's prior to admission medication history, as well as current inpatient medications listed per the [facility].  Current medication indications, dosing, frequency, and notable side effects reviewed with patient. patient verbalized understanding of current inpatient medication regimen and is aware that the After Visit Summary when presented, will represent the most accurate medication list at discharge.   Angelica Brown expressed concerns regarding PICC line. We spoke about need for IV ABX x3weeks and need for PICC outpatient. Patient expressed understanding and was agreeable to getting PICC now.   Assessment: Understanding of regimen: good Understanding of indications: good Potential of compliance: good Barriers to Obtaining Medications: No  Patient instructed to contact inpatient pharmacy team with further questions or concerns if needed.    Time spent preparing for discharge counseling: 10 mins Time spent counseling patient: 15 mins   Thank you for allowing pharmacy to be a part of this patient's care.  Allena Katz, Pharm.D. PGY1 Pharmacy Resident 2/12/201811:43 AM Pager [phone]

## 2016-05-31 NOTE — Progress Notes (Addendum)
Subjective: She still has headaches of there will less bad than they were before. Antibiotics:  Anti-infectives    Start     Dose/Rate Route Frequency Ordered Stop   05/28/16 2200  cefTRIAXone (ROCEPHIN) 2 g in dextrose 5 % 50 mL IVPB     2 g 100 mL/hr over 30 Minutes Intravenous Every 12 hours 05/28/16 1453     05/28/16 1400  vancomycin (VANCOCIN) 1,500 mg in sodium chloride 0.9 % 500 mL IVPB     1,500 mg 250 mL/hr over 120 Minutes Intravenous Every 8 hours 05/28/16 0555     05/28/16 1200  metroNIDAZOLE (FLAGYL) tablet 500 mg     500 mg Oral Every 6 hours 05/28/16 0908     05/28/16 0630  vancomycin (VANCOCIN) 2,500 mg in sodium chloride 0.9 % 500 mL IVPB     2,500 mg 250 mL/hr over 120 Minutes Intravenous  Once 05/28/16 0555 05/28/16 1035   05/28/16 0600  ceFEPIme (MAXIPIME) 2 g in dextrose 5 % 50 mL IVPB  Status:  Discontinued     2 g 100 mL/hr over 30 Minutes Intravenous Every 8 hours 05/28/16 0558 05/28/16 1453      Medications: Scheduled Meds: . cefTRIAXone (ROCEPHIN)  IV  2 g Intravenous Q12H  . metroNIDAZOLE  500 mg Oral Q6H  . rivaroxaban  15 mg Oral BID WC   Followed by  . [START ON 06/20/2016] rivaroxaban  20 mg Oral Q supper  . sodium chloride flush  3 mL Intravenous Q12H  . sodium chloride flush  3 mL Intravenous Q12H  . topiramate  25 mg Oral BID  . vancomycin  1,500 mg Intravenous Q8H   Continuous Infusions: PRN Meds:.sodium chloride, acetaminophen, HYDROmorphone (DILAUDID) injection, ondansetron (ZOFRAN) IV, prochlorperazine, sodium chloride flush, traMADol    Objective: Weight change:   Intake/Output Summary (Last 24 hours) at 05/31/16 1152 Last data filed at 05/31/16 0900  Gross per 24 hour  Intake             1413 ml  Output                0 ml  Net             1413 ml   Blood pressure 100/73, pulse (!) 52, temperature 98 F (36.7 C), temperature source Oral, resp. rate 19, height 5\' 9"  (1.753 m), weight (!) 354 lb 1.6 oz (160.6 kg),  SpO2 97 %. Temp:  [97.4 F (36.3 C)-98.2 F (36.8 C)] 98 F (36.7 C) (02/12 1110) Pulse Rate:  [52-68] 52 (02/12 1110) Resp:  [14-23] 19 (02/12 1110) BP: (99-136)/(59-103) 100/73 (02/12 1110) SpO2:  [95 %-97 %] 97 % (02/12 1110)  Physical Exam: General: Alert and awake, oriented x3, not in any acute distress. HEENT: anicteric sclera, pupils reactive to light and accommodation, EOMI CVS regular rate, normal r,  no murmur rubs or gallops Chest: clear to auscultation bilaterally, no wheezing, rales or rhonchi Abdomen: soft nontender, nondistended, normal bowel sounds, Extremities: no  clubbing or edema noted bilaterally Skin: no rashes  Neuro: nonfocal  CBC:  CBC Latest Ref Rng & Units 05/31/2016 05/30/2016 05/29/2016  WBC 4.0 - 10.5 K/uL 15.6(H) 16.5(H) 18.5(H)  Hemoglobin 12.0 - 15.0 g/dL 14.2 11.7(L) 12.8  Hematocrit 36.0 - 46.0 % 35.0(L) 33.7(L) 36.7  Platelets 150 - 400 K/uL 360 340 363     BMET  Recent Labs  05/29/16 0144 05/31/16 0547  NA 137 137  K  3.5 3.7  CL 100* 103  CO2 24 25  GLUCOSE 98 96  BUN 5* <5*  CREATININE 0.71 0.71  CALCIUM 9.0 9.2     Liver Panel  No results for input(s): PROT, ALBUMIN, AST, ALT, ALKPHOS, BILITOT, BILIDIR, IBILI in the last 72 hours.     Sedimentation Rate No results for input(s): ESRSEDRATE in the last 72 hours. C-Reactive Protein No results for input(s): CRP in the last 72 hours.  Micro Results: Recent Results (from the past 720 hour(s))  Culture, blood (Routine X 2) w Reflex to ID Panel     Status: None (Preliminary result)   Collection Time: 05/28/16  3:50 AM  Result Value Ref Range Status   Specimen Description BLOOD RIGHT HAND  Final   Special Requests BOTTLES DRAWN AEROBIC ONLY  Final   Culture NO GROWTH 3 DAYS  Final   Report Status PENDING  Incomplete  Culture, blood (Routine X 2) w Reflex to ID Panel     Status: Abnormal   Collection Time: 05/28/16  3:57 AM  Result Value Ref Range Status   Specimen  Description BLOOD LEFT HAND  Final   Special Requests IN PEDIATRIC BOTTLE  Final   Culture  Setup Time   Final    GRAM POSITIVE COCCI IN CLUSTERS IN PEDIATRIC BOTTLE Organism ID to follow CRITICAL RESULT CALLED TO, READ BACK BY AND VERIFIED WITH: CARON AMEND, PHARMD @0425  05/29/16 MKELLY,MLT    Culture (A)  Final    STAPHYLOCOCCUS SPECIES (COAGULASE NEGATIVE) THE SIGNIFICANCE OF ISOLATING THIS ORGANISM FROM A SINGLE SET OF BLOOD CULTURES WHEN MULTIPLE SETS ARE DRAWN IS UNCERTAIN. PLEASE NOTIFY THE MICROBIOLOGY DEPARTMENT WITHIN ONE WEEK IF SPECIATION AND SENSITIVITIES ARE REQUIRED.    Report Status 05/30/2016 FINAL  Final  Blood Culture ID Panel (Reflexed)     Status: Abnormal   Collection Time: 05/28/16  3:57 AM  Result Value Ref Range Status   Enterococcus species NOT DETECTED NOT DETECTED Final   Listeria monocytogenes NOT DETECTED NOT DETECTED Final   Staphylococcus species DETECTED (A) NOT DETECTED Final    Comment: Methicillin (oxacillin) susceptible coagulase negative staphylococcus. Possible blood culture contaminant (unless isolated from more than one blood culture draw or clinical case suggests pathogenicity). No antibiotic treatment is indicated for blood  culture contaminants. CRITICAL RESULT CALLED TO, READ BACK BY AND VERIFIED WITH: CARON AMEND, PHARMD @0425  05/29/16 MKELLY,MLT    Staphylococcus aureus NOT DETECTED NOT DETECTED Final   Methicillin resistance NOT DETECTED NOT DETECTED Final   Streptococcus species NOT DETECTED NOT DETECTED Final   Streptococcus agalactiae NOT DETECTED NOT DETECTED Final   Streptococcus pneumoniae NOT DETECTED NOT DETECTED Final   Streptococcus pyogenes NOT DETECTED NOT DETECTED Final   Acinetobacter baumannii NOT DETECTED NOT DETECTED Final   Enterobacteriaceae species NOT DETECTED NOT DETECTED Final   Enterobacter cloacae complex NOT DETECTED NOT DETECTED Final   Escherichia coli NOT DETECTED NOT DETECTED Final   Klebsiella oxytoca  NOT DETECTED NOT DETECTED Final   Klebsiella pneumoniae NOT DETECTED NOT DETECTED Final   Proteus species NOT DETECTED NOT DETECTED Final   Serratia marcescens NOT DETECTED NOT DETECTED Final   Haemophilus influenzae NOT DETECTED NOT DETECTED Final   Neisseria meningitidis NOT DETECTED NOT DETECTED Final   Pseudomonas aeruginosa NOT DETECTED NOT DETECTED Final   Candida albicans NOT DETECTED NOT DETECTED Final   Candida glabrata NOT DETECTED NOT DETECTED Final   Candida krusei NOT DETECTED NOT DETECTED Final   Candida parapsilosis NOT  DETECTED NOT DETECTED Final   Candida tropicalis NOT DETECTED NOT DETECTED Final  MRSA PCR Screening     Status: None   Collection Time: 05/28/16  6:49 PM  Result Value Ref Range Status   MRSA by PCR NEGATIVE NEGATIVE Final    Comment:        The GeneXpert MRSA Assay (FDA approved for NASAL specimens only), is one component of a comprehensive MRSA colonization surveillance program. It is not intended to diagnose MRSA infection nor to guide or monitor treatment for MRSA infections.     Studies/Results: No results found.    Assessment/Plan:  INTERVAL HISTORY: She had some sinus bradycardia to the 30s and apparently bigeminy . He PVCs   Principal Problem:   Dural sinus thrombosis Active Problems:   Mastoiditis of right side    Angelica Brown is a 41 y.o. female with acute dural venous sinus thrombosis secondary to mastoiditis. She has cog-negative staph in one of 2 blood cultures which is likely a contaminant. She is currently on high-dose ceftriaxone and vancomycin and oral metronidazole. She did have some bradycardia over the weekend which was asymptomatic.  #1 Dural sinus thrombosis: Most likely secondary to mastoiditis. Because we do not have a target when he did cover her with vancomycin for MRSA along with high-dose ceftriaxone for central nervous system penetration and oral metronidazole to cover for anaerobes.    I would  also reimage her head with an MRI one month into her antimicrobial therapy to make sure she is responding to therapy  She should receive a six-week course of therapy  #2 Sinus bradycardia with arrhythmia: I ordered a 2-D echocardiogram to make sure that there is no obvious evidence of infection of the heart valves. The mechanism of her sinus thrombosis seems more likely to be due to extension of her mastoiditis and ear infection rather than a bacteremia.  Diagnosis: Dural vein thrombosis  Culture Result: No culture results  Allergies  Allergen Reactions  . Ampicillin Shortness Of Breath    Throat swelling  . Prednisone Nausea And Vomiting and Other (See Comments)    Cramping in legs, could barely walk  . Strawberry Extract Itching and Swelling    Discharge antibiotics: Ceftriaxone 2 g IV every 12 hours and vancomycin Per pharmacy protocol along with oral Metroprolol 500 mg 4 times daily Aim for Vancomycin trough 15-20 (unless otherwise indicated)  Duration: 6 weeks  End Date:  07/08/16   [facility] Care Per Protocol: Biweekly labs on IV antibiotics:  --BMP   weekly while on IV antibiotics: x__ CBC with differential  _x_ Vancomycin trough  __ Please pull PIC at completion of IV antibiotics x__ Please leave PIC in place until doctor has seen patient or been notified  Fax weekly labs to [phone]  Clinic Follow Up Appt:  Next 4 weeks and prior to stop date    LOS: 3 days   Acey Lav 05/31/2016, 11:52 AM

## 2016-05-31 NOTE — Plan of Care (Signed)
Problem: Education: Goal: Knowledge of [facility] Education information/materials will improve Outcome: Progressing D/w patient about plan of care. Patient wishes to discuss her PICC line and why she needs it/ what they think is wrong with her with the doctors before consenting to it.   Problem: Pain Managment: Goal: General experience of comfort will improve Outcome: Progressing Patient pain much better controlled. Dilaudid given once and Tramadol given once.  Patient able to get some rest during this shift.

## 2016-05-31 NOTE — Progress Notes (Signed)
Family Medicine Teaching Service Daily Progress Note Intern Pager: [phone]  Patient name: Angelica Brown Medical record number: [id number] Date of birth: 05-29-1975 Age: 41 y.o. Gender: female  Primary Care Provider: Levert Feinstein, MD Consultants: Neurology, ENT, ID Code Status: Full  Pt Overview and Major Events to Date:  2/9 - Admitted with dural venous sinus thrombosis  Assessment and Plan: Angelica Y Cooper-Wadeis a 41 y.o.femalepresenting with intractable headaches, found to have acute dural venous sinus thrombosis. PMH is significant for migraine, tobacco use, cholelithiasis  Acute dural venous sinus thrombosis- Confirmed on CT and MRI. Unclear inciting factor. Possible mastoiditis may be contributing. Not on hormonal therapy. Pregnancy test negative. Nonfocal neuro exam. - Now on xarelto - Transition to PO pain medications Oxycodone 5 mg IR q4h PRN - Tramadol 100mg  q6hrs prn - Zofran prn nausea - Low threshold to rescan head if symptoms worsen, repeat prior to D/C per neuro recs - Monitor CBC - transfer out of stepdown, telemetry - consider outpatient coag workup (patient with family hx of clots, one spontaneous abortion, false positive RPR) - repeat CT head today per neuro recs  Possible right mastoiditis- Effusion noted on MRI. Patient has shooting pain in mastoid region, but no other clinical signs.  - Vanc/CTX/Flagyl per ID recs - Will need 3 weeks of IV antibiotics per ID - PICC team consulted - ENT consulted - no need for surgical intervention at this time, will follow up outpatient.  - BCx - coag neg staph in 1/2 bottles, likely contaminant   Chronic Migraines - Start topamax 25mg  bid per neuro recs  Tobacco use - 11 pack/year tobacco history - tobacco cessation counseling when appropriate  FEN/GI: Regular diet Prophylaxis: Full dose heparin  Disposition: Home pending improvement  Subjective:  Patient doing well this morning. Pain  controlled. PVC's noted on telemetry overnight, EKG and mag were ordered and unchanged/WNL.   Objective: Temp:  [97.4 F (36.3 C)-98.2 F (36.8 C)] 98.2 F (36.8 C) (02/12 0751) Pulse Rate:  [64-68] 68 (02/12 0751) Resp:  [14-23] 19 (02/12 0751) BP: (99-136)/(59-103) 99/70 (02/12 0751) SpO2:  [95 %-97 %] 97 % (02/12 0751) Physical Exam: General: NAD, rests comfortably Cardiovascular: RRR, no m/r/g Respiratory: CTA bil Abdomen: soft, obese, nontedner Extremities: no LE edema  Laboratory:  Recent Labs Lab 05/29/16 0144 05/30/16 0325 05/31/16 0215  WBC 18.5* 16.5* 15.6*  HGB 12.8 11.7* 12.1  HCT 36.7 33.7* 35.0*  PLT 363 340 360    Recent Labs Lab 05/28/16 0357 05/29/16 0144 05/31/16 0547  NA 137 137 137  K 3.6 3.5 3.7  CL 101 100* 103  CO2 24 24 25   BUN 6 5* <5*  CREATININE 0.74 0.71 0.71  CALCIUM 9.1 9.0 9.2  GLUCOSE 111* 98 96      Imaging/Diagnostic Tests: No results found. Ct Head Wo Contrast  Result Date: 05/27/2016 CLINICAL DATA:  Headache with vomiting EXAM: CT HEAD WITHOUT CONTRAST TECHNIQUE: Contiguous axial images were obtained from the base of the skull through the vertex without intravenous contrast. COMPARISON:  MRI 12/09/2015, CT brain 11/23/2014 FINDINGS: Brain: No acute territorial infarction or hemorrhage is visualized. No focal mass, mass effect or midline shift. Ventricles nonenlarged. Vascular: Mildly hyperdense dural venous sinuses. No hyperdense arteries. No unexpected calcifications. Skull: Normal. Negative for fracture or focal lesion. Fluid in the inferior mastoids. Sinuses/Orbits: Mild mucosal thickening in the ethmoid sinuses. No acute orbital abnormality. Other: None IMPRESSION: 1. No hemorrhage, territorial infarct or mass is visualized. 2. Slight increased density  of the dural venous sinuses, could be related to slow flow or possible dehydration. If venous thrombosis is suspected, further evaluation with MRI could be obtained.  Electronically Signed   By: Jasmine Pang M.D.   On: 05/27/2016 22:11   Mr Laqueta Jean And Wo Contrast  Result Date: 05/28/2016 CLINICAL DATA:  41 y/o F; suspected idiopathic intracranial hypertension with sudden onset headache. EXAM: MRI ORBITS WITHOUT AND WITH CONTRAST MRI HEAD WITHOUT AND WITH CONTRAST MRI VENOGRAM WITHOUT CONTRAST TECHNIQUE: Multiplanar, multiecho pulse sequences of the brain and surrounding structures were obtained without and with intravenous contrast. Multiplanar, multiecho pulse sequences of the orbits and surrounding structures were obtained including fat saturation techniques, before and after intravenous contrast administration. MR venogram was performed without intravenous contrast. CONTRAST:  20 cc MultiHance COMPARISON:  05/27/2016 CT head FINDINGS: MRI HEAD FINDINGS Brain: No acute infarction, hemorrhage, hydrocephalus, extra-axial collection or mass lesion. No abnormal enhancement of brain parenchyma. Mild diffuse smooth pachymeningeal enhancement. Stable 2 mm tonsillar ectasia. Vascular: Increased T1 signal and loss of flow void on of superior sagittal sinus, right transverse sinus, right sigmoid sinus, and right upper internal jugular vein. Normal signal within the straight sinus, and left transverse sinus to upper internal jugular vein. Normal proximal arterial flow voids. Skull and upper cervical spine: Normal marrow signal. Other: None. MRI ORBITS FINDINGS Orbits: No traumatic or inflammatory finding. Globes, optic nerves, orbital fat, extraocular muscles, vascular structures, and lacrimal glands are normal. Bilateral dural ectasia of the optic nerve sheaths. Visualized sinuses: Right mastoid effusion. No abnormal signal of left mastoid air cells. Mucosal thickening of anterior ethmoid air cells and of the right frontal sinus. Soft tissues: Negative. Limited intracranial: As above. MRI VENOGRAM FINDINGS Absent flow related signal within the mid and posterosuperior sagittal sinus,  right transverse sinus, right sigmoid sinus, and right upper internal jugular vein. Normal flow related signal within the straight sinus, internal cerebral veins, left transverse sinus, left sigmoid sinus, and left upper internal jugular vein. There are enlarged cortical veins likely resultant from collateral circulation. The anterior superior sagittal sinus demonstrates normal flow related signal. IMPRESSION: 1. Acute dural venous sinus thrombosis of the mid and posterior segments of the superior sagittal sinus, right transverse sinus, right sigmoid sinus, and right upper internal jugular vein. No hemorrhage or acute infarction of the brain. 2. Diffuse smooth pachymeningeal enhancement is likely related to dural venous sinus thrombosis. Meningeal infection/inflammation or intracranial hypotension is considered less likely. 3. Right mastoid effusion. No associate extra or intracranial abscess identified. 4. Bilateral optic nerve sheath dural ectasia is nonspecific which can be seen with idiopathic intracranial hypertension. These results were called by telephone at the time of interpretation on 05/28/2016 at 1:25 am to Dr. Earley Favor , who verbally acknowledged these results. Electronically Signed   By: Mitzi Hansen M.D.   On: 05/28/2016 01:30   Mr Susie Cassette Head  Result Date: 05/28/2016 CLINICAL DATA:  41 y/o F; suspected idiopathic intracranial hypertension with sudden onset headache. EXAM: MRI ORBITS WITHOUT AND WITH CONTRAST MRI HEAD WITHOUT AND WITH CONTRAST MRI VENOGRAM WITHOUT CONTRAST TECHNIQUE: Multiplanar, multiecho pulse sequences of the brain and surrounding structures were obtained without and with intravenous contrast. Multiplanar, multiecho pulse sequences of the orbits and surrounding structures were obtained including fat saturation techniques, before and after intravenous contrast administration. MR venogram was performed without intravenous contrast. CONTRAST:  20 cc MultiHance  COMPARISON:  05/27/2016 CT head FINDINGS: MRI HEAD FINDINGS Brain: No acute infarction, hemorrhage, hydrocephalus, extra-axial collection  or mass lesion. No abnormal enhancement of brain parenchyma. Mild diffuse smooth pachymeningeal enhancement. Stable 2 mm tonsillar ectasia. Vascular: Increased T1 signal and loss of flow void on of superior sagittal sinus, right transverse sinus, right sigmoid sinus, and right upper internal jugular vein. Normal signal within the straight sinus, and left transverse sinus to upper internal jugular vein. Normal proximal arterial flow voids. Skull and upper cervical spine: Normal marrow signal. Other: None. MRI ORBITS FINDINGS Orbits: No traumatic or inflammatory finding. Globes, optic nerves, orbital fat, extraocular muscles, vascular structures, and lacrimal glands are normal. Bilateral dural ectasia of the optic nerve sheaths. Visualized sinuses: Right mastoid effusion. No abnormal signal of left mastoid air cells. Mucosal thickening of anterior ethmoid air cells and of the right frontal sinus. Soft tissues: Negative. Limited intracranial: As above. MRI VENOGRAM FINDINGS Absent flow related signal within the mid and posterosuperior sagittal sinus, right transverse sinus, right sigmoid sinus, and right upper internal jugular vein. Normal flow related signal within the straight sinus, internal cerebral veins, left transverse sinus, left sigmoid sinus, and left upper internal jugular vein. There are enlarged cortical veins likely resultant from collateral circulation. The anterior superior sagittal sinus demonstrates normal flow related signal. IMPRESSION: 1. Acute dural venous sinus thrombosis of the mid and posterior segments of the superior sagittal sinus, right transverse sinus, right sigmoid sinus, and right upper internal jugular vein. No hemorrhage or acute infarction of the brain. 2. Diffuse smooth pachymeningeal enhancement is likely related to dural venous sinus thrombosis.  Meningeal infection/inflammation or intracranial hypotension is considered less likely. 3. Right mastoid effusion. No associate extra or intracranial abscess identified. 4. Bilateral optic nerve sheath dural ectasia is nonspecific which can be seen with idiopathic intracranial hypertension. These results were called by telephone at the time of interpretation on 05/28/2016 at 1:25 am to Dr. Earley Favor , who verbally acknowledged these results. Electronically Signed   By: Mitzi Hansen M.D.   On: 05/28/2016 01:30   Mr Rockwell [place] ZO Contrast  Result Date: 05/28/2016 CLINICAL DATA:  41 y/o F; suspected idiopathic intracranial hypertension with sudden onset headache. EXAM: MRI ORBITS WITHOUT AND WITH CONTRAST MRI HEAD WITHOUT AND WITH CONTRAST MRI VENOGRAM WITHOUT CONTRAST TECHNIQUE: Multiplanar, multiecho pulse sequences of the brain and surrounding structures were obtained without and with intravenous contrast. Multiplanar, multiecho pulse sequences of the orbits and surrounding structures were obtained including fat saturation techniques, before and after intravenous contrast administration. MR venogram was performed without intravenous contrast. CONTRAST:  20 cc MultiHance COMPARISON:  05/27/2016 CT head FINDINGS: MRI HEAD FINDINGS Brain: No acute infarction, hemorrhage, hydrocephalus, extra-axial collection or mass lesion. No abnormal enhancement of brain parenchyma. Mild diffuse smooth pachymeningeal enhancement. Stable 2 mm tonsillar ectasia. Vascular: Increased T1 signal and loss of flow void on of superior sagittal sinus, right transverse sinus, right sigmoid sinus, and right upper internal jugular vein. Normal signal within the straight sinus, and left transverse sinus to upper internal jugular vein. Normal proximal arterial flow voids. Skull and upper cervical spine: Normal marrow signal. Other: None. MRI ORBITS FINDINGS Orbits: No traumatic or inflammatory finding. Globes, optic nerves, orbital  fat, extraocular muscles, vascular structures, and lacrimal glands are normal. Bilateral dural ectasia of the optic nerve sheaths. Visualized sinuses: Right mastoid effusion. No abnormal signal of left mastoid air cells. Mucosal thickening of anterior ethmoid air cells and of the right frontal sinus. Soft tissues: Negative. Limited intracranial: As above. MRI VENOGRAM FINDINGS Absent flow related signal within the mid and posterosuperior  sagittal sinus, right transverse sinus, right sigmoid sinus, and right upper internal jugular vein. Normal flow related signal within the straight sinus, internal cerebral veins, left transverse sinus, left sigmoid sinus, and left upper internal jugular vein. There are enlarged cortical veins likely resultant from collateral circulation. The anterior superior sagittal sinus demonstrates normal flow related signal. IMPRESSION: 1. Acute dural venous sinus thrombosis of the mid and posterior segments of the superior sagittal sinus, right transverse sinus, right sigmoid sinus, and right upper internal jugular vein. No hemorrhage or acute infarction of the brain. 2. Diffuse smooth pachymeningeal enhancement is likely related to dural venous sinus thrombosis. Meningeal infection/inflammation or intracranial hypotension is considered less likely. 3. Right mastoid effusion. No associate extra or intracranial abscess identified. 4. Bilateral optic nerve sheath dural ectasia is nonspecific which can be seen with idiopathic intracranial hypertension. These results were called by telephone at the time of interpretation on 05/28/2016 at 1:25 am to Dr. Earley Favor , who verbally acknowledged these results. Electronically Signed   By: Mitzi Hansen M.D.   On: 05/28/2016 01:30   Howard Pouch, MD 05/31/2016, 8:26 AM PGY-1, [facility] FPTS Intern pager: [phone], text pages welcome

## 2016-05-31 NOTE — Progress Notes (Signed)
During assessment pt's right arm where the PICC line is, noted to be swollen, hot to the touch, and painful with some large lumpy spots. I asked the pt if this was new onset but she says it has been that way since her IV's went bad at the beginning of her admission prior to PICC placement but that she hasnt reported it to any staff members till now. I notified on call MD Bacigalupo of this and she said treatment team will follow up on this in the morning. Will continue to monitor.

## 2016-05-31 NOTE — Progress Notes (Signed)
  Echocardiogram 2D Echocardiogram has been performed.  [name] M 05/31/2016, 4:07 PM

## 2016-06-01 DIAGNOSIS — I808 Phlebitis and thrombophlebitis of other sites: Secondary | ICD-10-CM

## 2016-06-01 DIAGNOSIS — I82611 Acute embolism and thrombosis of superficial veins of right upper extremity: Secondary | ICD-10-CM

## 2016-06-01 LAB — BASIC METABOLIC PANEL
ANION GAP: 11 (ref 5–15)
BUN: 7 mg/dL (ref 6–20)
CO2: 24 mmol/L (ref 22–32)
Calcium: 9.4 mg/dL (ref 8.9–10.3)
Chloride: 101 mmol/L (ref 101–111)
Creatinine, Ser: 0.79 mg/dL (ref 0.44–1.00)
GLUCOSE: 117 mg/dL — AB (ref 65–99)
Potassium: 3.8 mmol/L (ref 3.5–5.1)
Sodium: 136 mmol/L (ref 135–145)

## 2016-06-01 LAB — CBC
HEMATOCRIT: 37.8 % (ref 36.0–46.0)
Hemoglobin: 13.3 g/dL (ref 12.0–15.0)
MCH: 28.4 pg (ref 26.0–34.0)
MCHC: 35.2 g/dL (ref 30.0–36.0)
MCV: 80.6 fL (ref 78.0–100.0)
PLATELETS: 385 10*3/uL (ref 150–400)
RBC: 4.69 MIL/uL (ref 3.87–5.11)
RDW: 15.2 % (ref 11.5–15.5)
WBC: 15.2 10*3/uL — AB (ref 4.0–10.5)

## 2016-06-01 LAB — ECHOCARDIOGRAM COMPLETE
Height: 69 in
Weight: 5665.6 oz

## 2016-06-01 NOTE — Progress Notes (Signed)
Family Medicine Teaching Service Daily Progress Note Intern Pager: [phone]  Patient name: Angelica Brown Medical record number: [id number] Date of birth: 1975-11-15 Age: 41 y.o. Gender: female  Primary Care Provider: Levert Feinstein, MD Consultants: Neurology, ENT, ID Code Status: Full  Pt Overview and Major Events to Date:  2/9 - Admitted with dural venous sinus thrombosis  Assessment and Plan: Angelica Brown a 41 y.o.femalepresenting with intractable headaches, found to have acute dural venous sinus thrombosis. PMH is significant for migraine, tobacco use, cholelithiasis  Acute dural venous sinus thrombosis- Confirmed on CT and MRI. Unclear inciting factor. Possible mastoiditis may be contributing. Not on hormonal therapy. Pregnancy test negative. Nonfocal neuro exam. - Repeat CT head performed yesterday: Stable appearance of the brain with probable persistent dural sinus thrombosis, No evidence for acute intracranial hemorrhage, venous infarct, or other complication identified, Persistent small right mastoid effusion. - will call HF to plug in patient for new HF - Now on xarelto - Transition to PO pain medications Oxycodone 5 mg IR q4h PRN - Tramadol 100mg  q6hrs prn - Zofran prn nausea - Monitor CBC - transfer out of stepdown, telemetry - consider outpatient coag workup after off anticoagulation (patient with family hx of clots, one spontaneous abortion, false positive RPR) - cardiac echo performed yesterday will f/u results (r/o vegetation)   Possible right mastoiditis- Effusion noted on MRI. Patient has shooting pain in mastoid region, but no other clinical signs, no tenderness  - Vanc/CTX/Flagyl per ID recs - Will need 6 weeks of IV antibiotics per ID - PICC team consulted - ENT consulted - no need for surgical intervention at this time, will follow up outpatient.  - BCx - coag neg staph in 1/2 bottles, likely contaminant   Chronic Migraines - Start  topamax 25mg  bid per neuro recs  Tobacco use - 11 pack/year tobacco history - tobacco cessation counseling when appropriate  FEN/GI: Regular diet Prophylaxis: Full dose heparin  Disposition: Home pending improvement  Subjective:  Patient is doing very well this morning, endorses 2 out of 10 pain which is a large improvement. She is tolerating by mouth pain controlled well. No acute events overnight. Patient would like to know when she can go home.  Objective: Temp:  [98 F (36.7 C)-98.8 F (37.1 C)] 98.2 F (36.8 C) (02/13 0500) Pulse Rate:  [52-71] 68 (02/13 0500) Resp:  [18-23] 18 (02/13 0500) BP: (100-135)/(61-100) 129/69 (02/13 0500) SpO2:  [97 %-100 %] 100 % (02/13 0500) Physical Exam: General: NAD, rests comfortably Cardiovascular: RRR, no m/r/g Respiratory: CTA bil, no wheezes rhonchi or rales Abdomen: soft, obese, nontender Extremities: no LE edema  Laboratory:  Recent Labs Lab 05/30/16 0325 05/31/16 0215 06/01/16 0421  WBC 16.5* 15.6* 15.2*  HGB 11.7* 12.1 13.3  HCT 33.7* 35.0* 37.8  PLT 340 360 385    Recent Labs Lab 05/29/16 0144 05/31/16 0547 06/01/16 0421  NA 137 137 136  K 3.5 3.7 3.8  CL 100* 103 101  CO2 24 25 24   BUN 5* <5* 7  CREATININE 0.71 0.71 0.79  CALCIUM 9.0 9.2 9.4  GLUCOSE 98 96 117*      Imaging/Diagnostic Tests: Ct Head Wo Contrast  Result Date: 05/31/2016 CLINICAL DATA:  Follow-up examination for dural sinus thrombosis. EXAM: CT HEAD WITHOUT CONTRAST TECHNIQUE: Contiguous axial images were obtained from the base of the skull through the vertex without intravenous contrast. COMPARISON:  Comparison made with prior CT and MRI from 05/27/2016. FINDINGS: Brain: Patient's known dural sinus thrombosis  not well evaluated on this noncontrast examination, although there is persistent increased density along the superior sagittal sinus with probable persistent filling defect within the right transverse and sigmoid sinuses. No evidence  for acute intracranial hemorrhage or infarct identified. No mass lesion, midline shift, or mass effect. Ventricles are somewhat slit-like, stable from previous. Cerebellar tonsils mildly low lying within the foramen magnum, also stable. No extra-axial fluid collection. Vascular: No hyperdense arterial vessel. Skull: Scalp soft tissues within normal limits.  Calvarium intact. Sinuses/Orbits: Globes and orbital soft tissues within normal limits. Mild scattered opacity within the right frontal sinus, similar to prior. Paranasal sinuses are otherwise clear. Persistent small right mastoid effusion, similar to previous. Left mastoid air cells are clear. IMPRESSION: 1. Stable appearance of the brain with probable persistent dural sinus thrombosis, better evaluated on recent MRI from 05/27/2016. No evidence for acute intracranial hemorrhage, venous infarct, or other complication identified. 2. Persistent small right mastoid effusion. Electronically Signed   By: Rise Mu M.D.   On: 05/31/2016 17:58   Ct Head Wo Contrast  Result Date: 05/31/2016 CLINICAL DATA:  Follow-up examination for dural sinus thrombosis. EXAM: CT HEAD WITHOUT CONTRAST TECHNIQUE: Contiguous axial images were obtained from the base of the skull through the vertex without intravenous contrast. COMPARISON:  Comparison made with prior CT and MRI from 05/27/2016. FINDINGS: Brain: Patient's known dural sinus thrombosis not well evaluated on this noncontrast examination, although there is persistent increased density along the superior sagittal sinus with probable persistent filling defect within the right transverse and sigmoid sinuses. No evidence for acute intracranial hemorrhage or infarct identified. No mass lesion, midline shift, or mass effect. Ventricles are somewhat slit-like, stable from previous. Cerebellar tonsils mildly low lying within the foramen magnum, also stable. No extra-axial fluid collection. Vascular: No hyperdense  arterial vessel. Skull: Scalp soft tissues within normal limits.  Calvarium intact. Sinuses/Orbits: Globes and orbital soft tissues within normal limits. Mild scattered opacity within the right frontal sinus, similar to prior. Paranasal sinuses are otherwise clear. Persistent small right mastoid effusion, similar to previous. Left mastoid air cells are clear. IMPRESSION: 1. Stable appearance of the brain with probable persistent dural sinus thrombosis, better evaluated on recent MRI from 05/27/2016. No evidence for acute intracranial hemorrhage, venous infarct, or other complication identified. 2. Persistent small right mastoid effusion. Electronically Signed   By: Rise Mu M.D.   On: 05/31/2016 17:58   CT HEAD 06/01/2016  FINDINGS: Brain: Patient's known dural sinus thrombosis not well evaluated on this noncontrast examination, although there is persistent increased density along the superior sagittal sinus with probable persistent filling defect within the right transverse and sigmoid sinuses. No evidence for acute intracranial hemorrhage or infarct identified. No mass lesion, midline shift, or mass effect. Ventricles are somewhat slit-like, stable from previous. Cerebellar tonsils mildly low lying within the foramen magnum, also stable. No extra-axial fluid collection. Vascular: No hyperdense arterial vessel. Skull: Scalp soft tissues within normal limits.  Calvarium intact. Sinuses/Orbits: Globes and orbital soft tissues within normal limits. Mild scattered opacity within the right frontal sinus, similar to prior. Paranasal sinuses are otherwise clear. Persistent small right mastoid effusion, similar to previous. Left mastoid air cells are clear. IMPRESSION: 1. Stable appearance of the brain with probable persistent dural sinus thrombosis, better evaluated on recent MRI from 05/27/2016. No evidence for acute intracranial hemorrhage, venous infarct, or other complication  identified. 2. Persistent small right mastoid effusion.    Howard Pouch, MD 06/01/2016, 9:43 AM PGY-1, [facility] FPTS Intern pager: [phone], text pages welcome

## 2016-06-01 NOTE — Discharge Summary (Signed)
[facility] Discharge Summary  Patient name: Angelica Brown Medical record number: [id number] Date of birth: 20-Aug-1975 Age: 41 y.o. Gender: female Date of Admission: 05/27/2016  Date of Discharge: 2/14 Admitting Physician: Lind Covert, MD  Primary Care Provider: Chrisandra Netters, MD Consultants: Neurology, infectious disease  Indication for Hospitalization: Dural venous thrombosis  Discharge Diagnoses/Problem List:  Patient Active Problem List   Diagnosis Date Noted  . Superficial venous thrombosis of right upper extremity   . Sinus bradycardia   . Mastoiditis of right side 05/29/2016  . 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 Papilledema of both eyes   . Acute nonintractable headache 05/26/2016  . Onychomycosis 04/16/2013  . Family history of breast cancer 01/17/2013  . Sickle cell trait ([place])   . OBESITY, MORBID 07/10/2008  . SYNCOPE 10/24/2006  . CHOLELITHIASIS 07/13/2006  . TOBACCO DEPENDENCE 06/16/2006  . METRORRHAGIA 06/16/2006    Disposition: Home  Discharge Condition: Stable  Discharge Exam: Please see him from the day of discharge  Brief Hospital Course:  41 year old woman with a history of migraine, tobacco use presented with intractable headaches, found to have an acute dural venous sinus thrombosis. The patient was initially started on heparin gtt and subsequently transitioned to xarelto.   She is noted to have a mastoid effusion on CT head and MRI. It was thought that the mastoid effusion may be an inciting event for the thrombosis. Infectious disease was consulted and initiated broad-spectrum antibiotics with vancomycin and ceftriaxone and Flagyl which will be continued for a total of 6 weeks through the PICC at discharge.  For pain control the patient was initially managed with morphine which was not drooling her symptoms, she was escalated to Dilaudid which improved her diffuse headache. She was also started on  Topamax.  She was transitioned to oral pain medication and was discharged with 5 days of oxycodone 5 mg IR.  Repeat CT was done prior to discharge and noted to be stable. The patient was noted to have swelling in the arm or the PICC line was placed and so went for ultrasound of the arm.  There was a DVT at the site of the PICC. Patient was seen by PICC team who determined that she could still get medication through the PICC.   Issues for Follow Up:  1. Patient to complete 6 weeks of antibiotic therapy per ID recommendation 2. Patient will need a follow-up MRI in 6 months  3. Patient was discharged on Xarelto, will require 15 mg twice a day for 21 days and then transition to 20 mg once daily.  Significant Procedures: PICC placed  Significant Labs and Imaging:   Recent Labs Lab 05/31/16 0215 06/01/16 0421 06/02/16 0402  WBC 15.6* 15.2* 15.2*  HGB 12.1 13.3 12.4  HCT 35.0* 37.8 35.2*  PLT 360 385 347    Recent Labs Lab 05/31/16 0547 06/01/16 0421 06/02/16 1025  NA 137 136  --   K 3.7 3.8  --   CL 103 101  --   CO2 25 24  --   GLUCOSE 96 117*  --   BUN <5* 7  --   CREATININE 0.71 0.79  --   CALCIUM 9.2 9.4  --   MG 2.4  --   --   PHOS  --   --  3.1    Results/Tests Pending at Time of Discharge: None  Discharge Medications:  Allergies as of 06/02/2016      Reactions  Ampicillin Shortness Of Breath   Throat swelling   Prednisone Nausea And Vomiting, Other (See Comments)   Cramping in legs, could barely walk   Strawberry Extract Itching, Swelling      Medication List    STOP taking these medications   cyclobenzaprine 10 MG tablet Commonly known as:  FLEXERIL   sulfamethoxazole-trimethoprim 800-160 MG tablet Commonly known as:  BACTRIM DS,SEPTRA DS     TAKE these medications   aspirin-acetaminophen-caffeine 250-250-65 MG tablet Commonly known as:  EXCEDRIN MIGRAINE Take 2 tablets by mouth every 6 (six) hours as needed for headache.   cefTRIAXone  IVPB Commonly known as:  ROCEPHIN Inject 2 g into the vein every 12 (twelve) hours. Indication:  mastoiditis Last Day of Therapy:  07/08/16 Labs - Once weekly:  CBC/D and BMP, Labs - Every other week:  ESR and CRP   fluticasone 50 MCG/ACT nasal spray Commonly known as:  FLONASE Place 1 spray into the nose daily as needed for allergies.   ibuprofen 800 MG tablet Commonly known as:  ADVIL,MOTRIN Take 1 tablet (800 mg total) by mouth every 6 (six) hours as needed for moderate pain.   metroNIDAZOLE 500 MG tablet Commonly known as:  FLAGYL Take 1 tablet (500 mg total) by mouth every 6 (six) hours.   ondansetron 4 MG tablet Commonly known as:  ZOFRAN Take 1 tablet (4 mg total) by mouth every 8 (eight) hours as needed for nausea or vomiting.   oxyCODONE 5 MG immediate release tablet Commonly known as:  Oxy IR/ROXICODONE Take 1 tablet (5 mg total) by mouth every 6 (six) hours as needed for moderate pain or severe pain.   Rivaroxaban 15 MG Tabs tablet Commonly known as:  XARELTO Take 1 tablet (15 mg total) by mouth 2 (two) times daily with a meal.   rivaroxaban 20 MG Tabs tablet Commonly known as:  XARELTO Take 1 tablet (20 mg total) by mouth daily with supper. Start taking on:  06/20/2016   SWIMMERS EAR DROPS OT Place 1 drop into both ears daily as needed (fluid in ears).   topiramate 25 MG tablet Commonly known as:  TOPAMAX Take 1 tablet (25 mg total) by mouth 2 (two) times daily.   vancomycin IVPB Inject 1,500 mg into the vein every 8 (eight) hours. Indication:  mastoiditis Last Day of Therapy:  07/08/16 Labs - Sunday/Monday:  CBC/D, BMP, and vancomycin trough. Labs - Thursday:  BMP and vancomycin trough Labs - Every other week:  ESR and CRP            Home Infusion Instuctions        Start     Ordered   06/02/16 0000  Home infusion instructions [facility] May follow [place] Dosing Protocol; May administer Cathflo as needed to maintain patency of vascular  access device.; Flushing of vascular access device: per [facility] Protocol: 0.9% NaCl pre/post medica...    Question Answer Comment  Instructions May follow [place] Dosing Protocol   Instructions May administer Cathflo as needed to maintain patency of vascular access device.   Instructions Flushing of vascular access device: per [facility] Protocol: 0.9% NaCl pre/post medication administration and prn patency; Heparin 100 u/ml, 23m for implanted ports and Heparin 10u/ml, 545mfor all other central venous catheters.   Instructions May follow [facility] Anaphylaxis Protocol for First Dose Administration in the home: 0.9% NaCl at 25-50 ml/hr to maintain IV access for protocol meds. Epinephrine 0.3 ml IV/IM PRN and Benadryl 25-50 IV/IM PRN  s/s of anaphylaxis.   Instructions [facility] Infusion Coordinator (RN) to assist per patient IV care needs in the home PRN.      06/02/16 1147      Discharge Instructions: Please refer to Patient Instructions section of EMR for full details.  Patient was counseled important signs and symptoms that should prompt return to medical care, changes in medications, dietary instructions, activity restrictions, and follow up appointments.   Follow-Up Appointments: Follow-up Information    BATES, DWIGHT, MD. Schedule an appointment as soon as possible for a visit in 2 week(s).   Specialty:  Otolaryngology Contact information: [address] [place] [address] [phone]        Chrisandra Netters, MD. Go on 06/08/2016.   Specialty:  Family Medicine Why:  Your appointment is at 8:30 AM. Please arrive at the [facility] medicine center by 8:15 AM. Contact information: [place] [address] [phone]           Everrett Coombe, MD 06/06/2016, 7:55 PM PGY-1, [place]

## 2016-06-01 NOTE — Progress Notes (Signed)
Subjective:  He has an area of induration and hardness in her forearm that I think is likely at minimum a superficial thrombus from her prior peripheral IV.  Antibiotics:  Anti-infectives    Start     Dose/Rate Route Frequency Ordered Stop   05/28/16 2200  cefTRIAXone (ROCEPHIN) 2 g in dextrose 5 % 50 mL IVPB     2 g 100 mL/hr over 30 Minutes Intravenous Every 12 hours 05/28/16 1453     05/28/16 1400  vancomycin (VANCOCIN) 1,500 mg in sodium chloride 0.9 % 500 mL IVPB     1,500 mg 250 mL/hr over 120 Minutes Intravenous Every 8 hours 05/28/16 0555     05/28/16 1200  metroNIDAZOLE (FLAGYL) tablet 500 mg     500 mg Oral Every 6 hours 05/28/16 0908     05/28/16 0630  vancomycin (VANCOCIN) 2,500 mg in sodium chloride 0.9 % 500 mL IVPB     2,500 mg 250 mL/hr over 120 Minutes Intravenous  Once 05/28/16 0555 05/28/16 1035   05/28/16 0600  ceFEPIme (MAXIPIME) 2 g in dextrose 5 % 50 mL IVPB  Status:  Discontinued     2 g 100 mL/hr over 30 Minutes Intravenous Every 8 hours 05/28/16 0558 05/28/16 1453      Medications: Scheduled Meds: . cefTRIAXone (ROCEPHIN)  IV  2 g Intravenous Q12H  . docusate sodium  100 mg Oral Daily  . metroNIDAZOLE  500 mg Oral Q6H  . rivaroxaban  15 mg Oral BID WC   Followed by  . [START ON 06/20/2016] rivaroxaban  20 mg Oral Q supper  . sodium chloride flush  10-40 mL Intracatheter Q12H  . sodium chloride flush  3 mL Intravenous Q12H  . sodium chloride flush  3 mL Intravenous Q12H  . topiramate  25 mg Oral BID  . vancomycin  1,500 mg Intravenous Q8H   Continuous Infusions: PRN Meds:.sodium chloride, acetaminophen, ondansetron (ZOFRAN) IV, oxyCODONE, polyethylene glycol, prochlorperazine, sodium chloride flush, sodium chloride flush, traMADol    Objective: Weight change:   Intake/Output Summary (Last 24 hours) at 06/01/16 1259 Last data filed at 06/01/16 1053  Gross per 24 hour  Intake             1820 ml  Output                1 ml  Net              1819 ml   Blood pressure 129/69, pulse 68, temperature 98.2 F (36.8 C), temperature source Oral, resp. rate 18, height 5\' 9"  (1.753 m), weight (!) 354 lb 1.6 oz (160.6 kg), SpO2 100 %. Temp:  [98.2 F (36.8 C)-98.8 F (37.1 C)] 98.2 F (36.8 C) (02/13 0500) Pulse Rate:  [65-71] 68 (02/13 0500) Resp:  [18-23] 18 (02/13 0500) BP: (118-135)/(61-100) 129/69 (02/13 0500) SpO2:  [99 %-100 %] 100 % (02/13 0500)  Physical Exam: General: Alert and awake, oriented x3, not in any acute distress. HEENT: anicteric sclera, pupils reactive to light and accommodation, EOMI CVS regular rate, normal r,  no murmur rubs or gallops Chest: clear to auscultation bilaterally, no wheezing, rales or rhonchi Abdomen: soft nontender, nondistended, normal bowel sounds, Extremities: I felt I couldn't palpable cord in her left forearm which I think is likely at least a superficial thrombus.  Skin: no rashes  Neuro: nonfocal  CBC:  CBC Latest Ref Rng & Units 06/01/2016 05/31/2016 05/30/2016  WBC 4.0 - 10.5  K/uL 15.2(H) 15.6(H) 16.5(H)  Hemoglobin 12.0 - 15.0 g/dL 92.1 19.4 11.7(L)  Hematocrit 36.0 - 46.0 % 37.8 35.0(L) 33.7(L)  Platelets 150 - 400 K/uL 385 360 340     BMET  Recent Labs  05/31/16 0547 06/01/16 0421  NA 137 136  K 3.7 3.8  CL 103 101  CO2 25 24  GLUCOSE 96 117*  BUN <5* 7  CREATININE 0.71 0.79  CALCIUM 9.2 9.4     Liver Panel  No results for input(s): PROT, ALBUMIN, AST, ALT, ALKPHOS, BILITOT, BILIDIR, IBILI in the last 72 hours.     Sedimentation Rate No results for input(s): ESRSEDRATE in the last 72 hours. C-Reactive Protein No results for input(s): CRP in the last 72 hours.  Micro Results: Recent Results (from the past 720 hour(s))  Culture, blood (Routine X 2) w Reflex to ID Panel     Status: None (Preliminary result)   Collection Time: 05/28/16  3:50 AM  Result Value Ref Range Status   Specimen Description BLOOD RIGHT HAND  Final   Special Requests  BOTTLES DRAWN AEROBIC ONLY  Final   Culture NO GROWTH 3 DAYS  Final   Report Status PENDING  Incomplete  Culture, blood (Routine X 2) w Reflex to ID Panel     Status: Abnormal   Collection Time: 05/28/16  3:57 AM  Result Value Ref Range Status   Specimen Description BLOOD LEFT HAND  Final   Special Requests IN PEDIATRIC BOTTLE  Final   Culture  Setup Time   Final    GRAM POSITIVE COCCI IN CLUSTERS IN PEDIATRIC BOTTLE Organism ID to follow CRITICAL RESULT CALLED TO, READ BACK BY AND VERIFIED WITH: CARON AMEND, PHARMD @0425  05/29/16 MKELLY,MLT    Culture (A)  Final    STAPHYLOCOCCUS SPECIES (COAGULASE NEGATIVE) THE SIGNIFICANCE OF ISOLATING THIS ORGANISM FROM A SINGLE SET OF BLOOD CULTURES WHEN MULTIPLE SETS ARE DRAWN IS UNCERTAIN. PLEASE NOTIFY THE MICROBIOLOGY DEPARTMENT WITHIN ONE WEEK IF SPECIATION AND SENSITIVITIES ARE REQUIRED.    Report Status 05/30/2016 FINAL  Final  Blood Culture ID Panel (Reflexed)     Status: Abnormal   Collection Time: 05/28/16  3:57 AM  Result Value Ref Range Status   Enterococcus species NOT DETECTED NOT DETECTED Final   Listeria monocytogenes NOT DETECTED NOT DETECTED Final   Staphylococcus species DETECTED (A) NOT DETECTED Final    Comment: Methicillin (oxacillin) susceptible coagulase negative staphylococcus. Possible blood culture contaminant (unless isolated from more than one blood culture draw or clinical case suggests pathogenicity). No antibiotic treatment is indicated for blood  culture contaminants. CRITICAL RESULT CALLED TO, READ BACK BY AND VERIFIED WITH: CARON AMEND, PHARMD @0425  05/29/16 MKELLY,MLT    Staphylococcus aureus NOT DETECTED NOT DETECTED Final   Methicillin resistance NOT DETECTED NOT DETECTED Final   Streptococcus species NOT DETECTED NOT DETECTED Final   Streptococcus agalactiae NOT DETECTED NOT DETECTED Final   Streptococcus pneumoniae NOT DETECTED NOT DETECTED Final   Streptococcus pyogenes NOT DETECTED NOT DETECTED  Final   Acinetobacter baumannii NOT DETECTED NOT DETECTED Final   Enterobacteriaceae species NOT DETECTED NOT DETECTED Final   Enterobacter cloacae complex NOT DETECTED NOT DETECTED Final   Escherichia coli NOT DETECTED NOT DETECTED Final   Klebsiella oxytoca NOT DETECTED NOT DETECTED Final   Klebsiella pneumoniae NOT DETECTED NOT DETECTED Final   Proteus species NOT DETECTED NOT DETECTED Final   Serratia marcescens NOT DETECTED NOT DETECTED Final   Haemophilus influenzae NOT DETECTED NOT DETECTED Final  Neisseria meningitidis NOT DETECTED NOT DETECTED Final   Pseudomonas aeruginosa NOT DETECTED NOT DETECTED Final   Candida albicans NOT DETECTED NOT DETECTED Final   Candida glabrata NOT DETECTED NOT DETECTED Final   Candida krusei NOT DETECTED NOT DETECTED Final   Candida parapsilosis NOT DETECTED NOT DETECTED Final   Candida tropicalis NOT DETECTED NOT DETECTED Final  MRSA PCR Screening     Status: None   Collection Time: 05/28/16  6:49 PM  Result Value Ref Range Status   MRSA by PCR NEGATIVE NEGATIVE Final    Comment:        The GeneXpert MRSA Assay (FDA approved for NASAL specimens only), is one component of a comprehensive MRSA colonization surveillance program. It is not intended to diagnose MRSA infection nor to guide or monitor treatment for MRSA infections.     Studies/Results: Ct Head Wo Contrast  Result Date: 05/31/2016 CLINICAL DATA:  Follow-up examination for dural sinus thrombosis. EXAM: CT HEAD WITHOUT CONTRAST TECHNIQUE: Contiguous axial images were obtained from the base of the skull through the vertex without intravenous contrast. COMPARISON:  Comparison made with prior CT and MRI from 05/27/2016. FINDINGS: Brain: Patient's known dural sinus thrombosis not well evaluated on this noncontrast examination, although there is persistent increased density along the superior sagittal sinus with probable persistent filling defect within the right transverse and sigmoid  sinuses. No evidence for acute intracranial hemorrhage or infarct identified. No mass lesion, midline shift, or mass effect. Ventricles are somewhat slit-like, stable from previous. Cerebellar tonsils mildly low lying within the foramen magnum, also stable. No extra-axial fluid collection. Vascular: No hyperdense arterial vessel. Skull: Scalp soft tissues within normal limits.  Calvarium intact. Sinuses/Orbits: Globes and orbital soft tissues within normal limits. Mild scattered opacity within the right frontal sinus, similar to prior. Paranasal sinuses are otherwise clear. Persistent small right mastoid effusion, similar to previous. Left mastoid air cells are clear. IMPRESSION: 1. Stable appearance of the brain with probable persistent dural sinus thrombosis, better evaluated on recent MRI from 05/27/2016. No evidence for acute intracranial hemorrhage, venous infarct, or other complication identified. 2. Persistent small right mastoid effusion. Electronically Signed   By: Rise Mu M.D.   On: 05/31/2016 17:58      Assessment/Plan:  INTERVAL HISTORY:  TTE no vegetations  Now ? Thrombus in superficial forearm   Principal Problem:   Dural venous sinus thrombosis Active Problems:   Mastoiditis of right side   Sinus bradycardia    Angelica Brown is a 41 y.o. female with acute dural venous sinus thrombosis secondary to mastoiditis. She has cog-negative staph in one of 2 blood cultures which is likely a contaminant. She is currently on high-dose ceftriaxone and vancomycin and oral metronidazole. She did have some bradycardia over the weekend which was asymptomatic.  #1 Dural sinus thrombosis: Most likely secondary to mastoiditis. Because we do not have a target when he did cover her with vancomycin for MRSA along with high-dose ceftriaxone for central nervous system penetration and oral metronidazole to cover for anaerobes.    I would also reimage her head with an MRI one  month into her antimicrobial therapy to make sure she is responding to therapy  She should receive a six-week course of therapy  #2 Sinus bradycardia with arrhythmia: I ordered a 2-D echocardiogram and there is no evidence of endocarditis by 2-D echocardiogram  #3 possible superficial thrombus : This could've been due to placement of an IV at think speaks to the possibility that  this patient may suffer from a hypercoagulable condition in addition to the inciting event of a mastoiditis developed into meningitis with a venous sinus thrombosis.  I would recommend getting a Doppler to see the extent of this clot certainly she is on anticoagulations should resolve but I think getting a Doppler might be prudent especially since it is near where her PICC line is as well.     Diagnosis: Dural vein thrombosis  Culture Result: No culture results  Allergies  Allergen Reactions  . Ampicillin Shortness Of Breath    Throat swelling  . Prednisone Nausea And Vomiting and Other (See Comments)    Cramping in legs, could barely walk  . Strawberry Extract Itching and Swelling    Discharge antibiotics: Ceftriaxone 2 g IV every 12 hours and vancomycin Per pharmacy protocol along with oral Metroprolol 500 mg 4 times daily Aim for Vancomycin trough 15-20 (unless otherwise indicated)  Duration: 6 weeks  End Date:  07/08/16   [facility] Care Per Protocol: Biweekly labs on IV antibiotics:  --BMP   weekly while on IV antibiotics: x__ CBC with differential  _x_ Vancomycin trough  __ Please pull PIC at completion of IV antibiotics x__ Please leave PIC in place until doctor has seen patient or been notified  Fax weekly labs to [phone]  Clinic Follow Up Appt:  Next 4 weeks and prior to stop date  I will sign off for now please call with further questions.    LOS: 4 days   Acey Lav 06/01/2016, 12:59 PM

## 2016-06-01 NOTE — Progress Notes (Addendum)
Pt had a 2.99secs pause and her heart rate dropped to 39 at around 11pm. Pt is asymptomatic, asleep. FM Resident on call was notified. No new orders. Will continue to monitor.  At 4:57am, pt had a 3.72secs pause on her EKG. Resident MD on call was notified. No new orders. Will continue to monitor.

## 2016-06-01 NOTE — Discharge Instructions (Signed)
Information on my medicine - XARELTO (rivaroxaban)  This medication education was reviewed with me or my healthcare representative as part of my discharge preparation.  The pharmacist that spoke with me during my hospital stay was:  Apryl Irine Seal, [facility]  WHY WAS XARELTO PRESCRIBED FOR YOU? Xarelto was prescribed to treat blood clots that may have been found in the veins of your legs (deep vein thrombosis) or in your lungs (pulmonary embolism) and to reduce the risk of them occurring again.  What do you need to know about Xarelto? The starting dose is one 15 mg tablet taken TWICE daily with food for the FIRST 21 DAYS then the dose is changed to one 20 mg tablet taken ONCE A DAY with your evening meal.  DO NOT stop taking Xarelto without talking to the health care provider who prescribed the medication.  Refill your prescription for 20 mg tablets before you run out.  After discharge, you should have regular check-up appointments with your healthcare provider that is prescribing your Xarelto.  In the future your dose may need to be changed if your kidney function changes by a significant amount.  What do you do if you miss a dose? If you are taking Xarelto TWICE DAILY and you miss a dose, take it as soon as you remember. You may take two 15 mg tablets (total 30 mg) at the same time then resume your regularly scheduled 15 mg twice daily the next day.  If you are taking Xarelto ONCE DAILY and you miss a dose, take it as soon as you remember on the same day then continue your regularly scheduled once daily regimen the next day. Do not take two doses of Xarelto at the same time.   Important Safety Information Xarelto is a blood thinner medicine that can cause bleeding. You should call your healthcare provider right away if you experience any of the following: ? Bleeding from an injury or your nose that does not stop. ? Unusual colored urine (red or dark brown) or unusual colored stools (red  or black). ? Unusual bruising for unknown reasons. ? A serious fall or if you hit your head (even if there is no bleeding).  Some medicines may interact with Xarelto and might increase your risk of bleeding while on Xarelto. To help avoid this, consult your healthcare provider or pharmacist prior to using any new prescription or non-prescription medications, including herbals, vitamins, non-steroidal anti-inflammatory drugs (NSAIDs) and supplements.  This website has more information on Xarelto: VisitDestination.com.br.

## 2016-06-02 ENCOUNTER — Inpatient Hospital Stay (HOSPITAL_COMMUNITY): Payer: Commercial Managed Care - HMO

## 2016-06-02 DIAGNOSIS — M7989 Other specified soft tissue disorders: Secondary | ICD-10-CM

## 2016-06-02 DIAGNOSIS — I82611 Acute embolism and thrombosis of superficial veins of right upper extremity: Secondary | ICD-10-CM

## 2016-06-02 DIAGNOSIS — M79609 Pain in unspecified limb: Secondary | ICD-10-CM

## 2016-06-02 LAB — CULTURE, BLOOD (ROUTINE X 2): CULTURE: NO GROWTH

## 2016-06-02 LAB — CBC
HEMATOCRIT: 35.2 % — AB (ref 36.0–46.0)
Hemoglobin: 12.4 g/dL (ref 12.0–15.0)
MCH: 28.4 pg (ref 26.0–34.0)
MCHC: 35.2 g/dL (ref 30.0–36.0)
MCV: 80.5 fL (ref 78.0–100.0)
Platelets: 347 10*3/uL (ref 150–400)
RBC: 4.37 MIL/uL (ref 3.87–5.11)
RDW: 15 % (ref 11.5–15.5)
WBC: 15.2 10*3/uL — ABNORMAL HIGH (ref 4.0–10.5)

## 2016-06-02 LAB — VANCOMYCIN, TROUGH: Vancomycin Tr: 22 ug/mL (ref 15–20)

## 2016-06-02 LAB — PHOSPHORUS: Phosphorus: 3.1 mg/dL (ref 2.5–4.6)

## 2016-06-02 MED ORDER — OXYCODONE HCL 5 MG PO TABS: 5.0000 mg | ORAL_TABLET | Freq: Four times a day (QID) | ORAL | 0 refills | Status: DC | PRN

## 2016-06-02 MED ORDER — HEPARIN SOD (PORK) LOCK FLUSH 100 UNIT/ML IV SOLN
250.0000 [IU] | INTRAVENOUS | Status: AC | PRN
  Administered 2016-06-02: 250 [IU]

## 2016-06-02 MED ORDER — TOPIRAMATE 25 MG PO TABS
25.0000 mg | ORAL_TABLET | Freq: Two times a day (BID) | ORAL | 0 refills | Status: DC
Start: 2016-06-02 — End: 2016-06-11

## 2016-06-02 MED ORDER — RIVAROXABAN 20 MG PO TABS: 20.0000 mg | ORAL_TABLET | Freq: Every day | ORAL | 0 refills | Status: DC

## 2016-06-02 MED ORDER — RIVAROXABAN 15 MG PO TABS: 15.0000 mg | ORAL_TABLET | Freq: Two times a day (BID) | ORAL | 0 refills | Status: DC

## 2016-06-02 MED ORDER — METRONIDAZOLE 500 MG PO TABS: 500.0000 mg | ORAL_TABLET | Freq: Four times a day (QID) | ORAL | 0 refills | Status: AC

## 2016-06-02 MED ORDER — CEFTRIAXONE IV (FOR PTA / DISCHARGE USE ONLY): 2.0000 g | Freq: Two times a day (BID) | INTRAVENOUS | 0 refills | Status: DC

## 2016-06-02 MED ORDER — VANCOMYCIN HCL 10 G IV SOLR
1500.0000 mg | Freq: Three times a day (TID) | INTRAVENOUS | Status: DC
  Administered 2016-06-02: 1500 mg via INTRAVENOUS
  Filled 2016-06-02 (×3): qty 1500

## 2016-06-02 MED ORDER — SODIUM CHLORIDE 0.9 % IV SOLN
1250.0000 mg | Freq: Three times a day (TID) | INTRAVENOUS | Status: DC
  Filled 2016-06-02 (×2): qty 1250

## 2016-06-02 MED ORDER — VANCOMYCIN IV (FOR PTA / DISCHARGE USE ONLY): 1500.0000 mg | Freq: Three times a day (TID) | INTRAVENOUS | 0 refills | Status: DC

## 2016-06-02 NOTE — Progress Notes (Signed)
Talked with vascular access team concerning current PICC in R arm with positive DVT. PICC was placed without difficulty and subsequently developed a clot. Concern for hypercoagulability. PICC line works appropriately. In agreement that removing current line and inserting PICC in L arm would likely result in another DVT and would not be necessary for patient to stay and receive. -- Durward Parcel, DO [facility], PGY-1

## 2016-06-02 NOTE — Progress Notes (Signed)
VASCULAR LAB PRELIMINARY  PRELIMINARY  PRELIMINARY  PRELIMINARY  Right upper extremity venous duplex completed.    Preliminary report:  Right -  Positive for a deep vein thrombosis of the subclavian vein along the PICC line. Also noted is superficial thrombosis of the cephalic and basilic veins  Tayron Hunnell, RVS 06/02/2016, 12:21 PM

## 2016-06-02 NOTE — Progress Notes (Signed)
Pt walked off the floor to the cafeteria without MD's authorization. CN notified. MD was notified of the situation to assess the situation. Care passed on to the day RN.

## 2016-06-02 NOTE — Progress Notes (Deleted)
Pharmacy Antibiotic Note  Angelica Brown is a 41 y.o. female admitted on 05/27/2016 with suspected mastoiditis. Pharmacy has been consulted for vancomycin dosing.   Vancomycin trough is now 22 and above goal, also was drawn 30 min early. Renal function is stable. Plan is for 6 weeks antibiotics.  Plan: Decrease vancomycin to 1250 mg IV q8h Monitor renal function Recommend checking frequent vancomycin troughs with weight and q8h regimen   Height: 5\' 9"  (175.3 cm) Weight: (!) 354 lb 1.6 oz (160.6 kg) IBW/kg (Calculated) : 66.2  Temp (24hrs), Avg:98 F (36.7 C), Min:97.8 F (36.6 C), Max:98.2 F (36.8 C)   Recent Labs Lab 05/27/16 1425 05/28/16 0357 05/29/16 0144 05/30/16 0325 05/30/16 1338 05/31/16 0215 05/31/16 0547 06/01/16 0421 06/02/16 0402 06/02/16 1025  WBC 14.6* 16.6* 18.5* 16.5*  --  15.6*  --  15.2* 15.2*  --   CREATININE 0.74 0.74 0.71  --   --   --  0.71 0.79  --   --   VANCOTROUGH  --   --   --   --  17  --   --   --   --  22*    Estimated Creatinine Clearance: 155 mL/min (by C-G formula based on SCr of 0.79 mg/dL).    Allergies  Allergen Reactions  . Ampicillin Shortness Of Breath    Throat swelling  . Prednisone Nausea And Vomiting and Other (See Comments)    Cramping in legs, could barely walk  . Strawberry Extract Itching and Swelling    Antimicrobials this admission: 2/9 Zosyn >> 2/10 2/9 Vanc >> 2/10 CTX >>  2/10 Flagyl >>  Dose adjustments this admission: 2/11 VT: 17 on 1500 mg q8h 2/14 VT: 22 (drawn 30 min early) on 1.5g q8h - decr to 1.25g q8h   Microbiology results: 2/9 BCx x2: 1/2 GPC in clusters (BCID: 1/2 staph (no mecA))  Thank you for allowing pharmacy to be a part of this patient's care.  Loura Back, PharmD, BCPS Clinical Pharmacist Phone for today [phone] Main pharmacy - [phone] 06/02/2016 11:31 AM

## 2016-06-02 NOTE — Progress Notes (Signed)
Discharge instructions printed and reviewed with patient and her significant other, and copy given for them to take home. All questions addressed at this time. New prescriptions given for patient to fill at her usual outpatient pharmacy. PICC line flushed and capped by IV team. Room searched for patient belongings by patient, and confirmed with patient that all valuables were accounted for, then staff escorted patient to discharge.

## 2016-06-02 NOTE — Progress Notes (Addendum)
Notified Jeri Modena [facility], and Lupita Leash [facility] clinical liaison to follow for home IV Abx needs and [facility] RN.

## 2016-06-02 NOTE — Progress Notes (Signed)
Assessed right upper arm PICC, noted some tenderness at insertion site. Flushed well and blood return noted. A large hardened area noted over basilic vein in forearm. Patient states, "painful". Dr. Abelardo Diesel notified and discussed risk and benefits of replacing the PICC. Recommended to leave PICC because placing PICC in left arm would most likely result in a DVT of the left arm. Decision  was made to leave present PICC in right arm and monitor. Patient and primary nurse were made aware. Will continue to monitor.

## 2016-06-02 NOTE — Progress Notes (Signed)
Pharmacy Antibiotic Note  Angelica Brown is a 41 y.o. female admitted on 05/27/2016 with mastoiditis.  Pharmacy has been consulted for vancomycin dosing. Plan is for 6 weeks total of ABX per ID (also on flagyl and CTX).  WBC elevated but stable at 15.2, afebrile, renal function stable at estimated CrCl  ~ 120.  Vancomycin trough drawn early today at 1025.  Vanc random =  22 today, drawn 1.5 hours before true trough.  Population ke calculated to be 0.104. True trough calculated to be 18.82.  Patient is therapeutic on current dose.  Plan: Continue current vancomycin dose of 1500 mg IV q8h. Monitor renal function, WBC, temp, and signs/symptoms of improvement. Vanc trough as needed. OPAT complete.  Height: 5\' 9"  (175.3 cm) Weight: (!) 354 lb 1.6 oz (160.6 kg) IBW/kg (Calculated) : 66.2  Temp (24hrs), Avg:98 F (36.7 C), Min:97.8 F (36.6 C), Max:98.2 F (36.8 C)   Recent Labs Lab 05/27/16 1425 05/28/16 0357 05/29/16 0144 05/30/16 0325 05/30/16 1338 05/31/16 0215 05/31/16 0547 06/01/16 0421 06/02/16 0402 06/02/16 1025  WBC 14.6* 16.6* 18.5* 16.5*  --  15.6*  --  15.2* 15.2*  --   CREATININE 0.74 0.74 0.71  --   --   --  0.71 0.79  --   --   VANCOTROUGH  --   --   --   --  17  --   --   --   --  22*    Estimated Creatinine Clearance: 155 mL/min (by C-G formula based on SCr of 0.79 mg/dL).    Allergies  Allergen Reactions  . Ampicillin Shortness Of Breath    Throat swelling  . Prednisone Nausea And Vomiting and Other (See Comments)    Cramping in legs, could barely walk  . Strawberry Extract Itching and Swelling    Antimicrobials this admission: 2/9 Zosyn >> 2/10 2/9 Vanc >> 2/10 CTX >>  2/10 Flagyl >>  Dose adjustments this admission: 2/11 VT: 17 on 1500 mg q8h 2/14 VT: 22 (drawn 1.5 hrs early) on 1.5g q8h - continue current dose   Microbiology results: 2/9 BCx x2: 1/2 GPC in clusters (BCID: 1/2 staph (no mecA))  Thank you for allowing pharmacy to be  a part of this patient's care.  Hayes Ludwig, PharmD Candidate 06/02/2016 11:52 AM

## 2016-06-02 NOTE — Progress Notes (Signed)
Family Medicine Teaching Service Daily Progress Note Intern Pager: [phone]  Patient name: Angelica Brown Medical record number: [id number] Date of birth: 1975-07-12 Age: 41 y.o. Gender: female  Primary Care Provider: Levert Feinstein, MD Consultants: Neurology, ENT, ID Code Status: Full  Pt Overview and Major Events to Date:  2/9 - Admitted with dural venous sinus thrombosis  Assessment and Plan: Angelica Brown a 41 y.o.femalepresenting with intractable headaches, found to have acute dural venous sinus thrombosis. PMH is significant for migraine, tobacco use, cholelithiasis  Acute dural venous sinus thrombosis- Confirmed on CT and MRI. Unclear inciting factor. Possible mastoiditis may be contributing. Not on hormonal therapy. Pregnancy test negative. Nonfocal neuro exam. - Repeat CT head : Stable, unchanged  - will call HF to plug in patient for new HF - Now on xarelto - Transition to PO pain medications Oxycodone 5 mg IR q4h PRN - Tramadol 100mg  q6hrs prn - Zofran prn nausea - Monitor CBC - transfer out of stepdown, telemetry - consider outpatient coag workup after off anticoagulation (patient with family hx of clots, one spontaneous abortion, false positive RPR) - cardiac echo - G2DD, LVEF 60-65%, mild concentric hypertrophy  Possible right mastoiditis- Effusion noted on MRI. Patient has shooting pain in mastoid region, but no other clinical signs, no tenderness  - Vanc/CTX/Flagyl per ID recs - Will need 6 weeks of IV antibiotics per ID - PICC in place - ENT consulted - no need for surgical intervention at this time, will follow up outpatient.  - BCx - coag neg staph in 1/2 bottles, likely contaminant   RUE tenderness, near picc - Concern for RUE DVT with induration, no erythema, no warmth. Pt on full dose anticoagulation however may change management if theres a DVT bc concern about her getting full dose of abx through picc. - RUE vascular U/S pending    Chronic Migraines - Started topamax 25mg  bid per neuro recs  Tobacco use - 11 pack/year tobacco history - tobacco cessation counseling when appropriate  FEN/GI: Regular diet Prophylaxis: Full dose xarelto  Disposition: Home pending improvement  Subjective:  Patient was kept overnight for RUE u/s.  She feels well this morning and is ready to go home after the ultrasound. She is a little tearful when I am in the room out of concern about whether she'll be able to play with her children when she gets home. We spoke and she was more comfortable after our conversation.  Objective: Temp:  [97.8 F (36.6 C)-98.2 F (36.8 C)] 98.2 F (36.8 C) (02/14 0538) Pulse Rate:  [66-84] 77 (02/14 0538) Resp:  [18-20] 18 (02/14 0538) BP: (128-134)/(52-68) 129/68 (02/14 0538) SpO2:  [96 %-98 %] 97 % (02/14 0538) Physical Exam: General: NAD, rests comfortably Cardiovascular: RRR, no m/r/g Respiratory: CTA bil, no wheezes rhonchi or rales Abdomen: soft, obese, nontender Extremities: no LE edema. RUE induration on the forearm, no warmth, no erythema, +tenderness  Laboratory:  Recent Labs Lab 05/31/16 0215 06/01/16 0421 06/02/16 0402  WBC 15.6* 15.2* 15.2*  HGB 12.1 13.3 12.4  HCT 35.0* 37.8 35.2*  PLT 360 385 347    Recent Labs Lab 05/29/16 0144 05/31/16 0547 06/01/16 0421  NA 137 137 136  K 3.5 3.7 3.8  CL 100* 103 101  CO2 24 25 24   BUN 5* <5* 7  CREATININE 0.71 0.71 0.79  CALCIUM 9.0 9.2 9.4  GLUCOSE 98 96 117*      Imaging/Diagnostic Tests: No results found. Ct Head Wo Contrast  Result  Date: 05/31/2016 CLINICAL DATA:  Follow-up examination for dural sinus thrombosis. EXAM: CT HEAD WITHOUT CONTRAST TECHNIQUE: Contiguous axial images were obtained from the base of the skull through the vertex without intravenous contrast. COMPARISON:  Comparison made with prior CT and MRI from 05/27/2016. FINDINGS: Brain: Patient's known dural sinus thrombosis not well evaluated on  this noncontrast examination, although there is persistent increased density along the superior sagittal sinus with probable persistent filling defect within the right transverse and sigmoid sinuses. No evidence for acute intracranial hemorrhage or infarct identified. No mass lesion, midline shift, or mass effect. Ventricles are somewhat slit-like, stable from previous. Cerebellar tonsils mildly low lying within the foramen magnum, also stable. No extra-axial fluid collection. Vascular: No hyperdense arterial vessel. Skull: Scalp soft tissues within normal limits.  Calvarium intact. Sinuses/Orbits: Globes and orbital soft tissues within normal limits. Mild scattered opacity within the right frontal sinus, similar to prior. Paranasal sinuses are otherwise clear. Persistent small right mastoid effusion, similar to previous. Left mastoid air cells are clear. IMPRESSION: 1. Stable appearance of the brain with probable persistent dural sinus thrombosis, better evaluated on recent MRI from 05/27/2016. No evidence for acute intracranial hemorrhage, venous infarct, or other complication identified. 2. Persistent small right mastoid effusion. Electronically Signed   By: Rise Mu M.D.   On: 05/31/2016 17:58   CT HEAD 06/01/2016  FINDINGS: Brain: Patient's known dural sinus thrombosis not well evaluated on this noncontrast examination, although there is persistent increased density along the superior sagittal sinus with probable persistent filling defect within the right transverse and sigmoid sinuses. No evidence for acute intracranial hemorrhage or infarct identified. No mass lesion, midline shift, or mass effect. Ventricles are somewhat slit-like, stable from previous. Cerebellar tonsils mildly low lying within the foramen magnum, also stable. No extra-axial fluid collection. Vascular: No hyperdense arterial vessel. Skull: Scalp soft tissues within normal limits.  Calvarium  intact. Sinuses/Orbits: Globes and orbital soft tissues within normal limits. Mild scattered opacity within the right frontal sinus, similar to prior. Paranasal sinuses are otherwise clear. Persistent small right mastoid effusion, similar to previous. Left mastoid air cells are clear. IMPRESSION: 1. Stable appearance of the brain with probable persistent dural sinus thrombosis, better evaluated on recent MRI from 05/27/2016. No evidence for acute intracranial hemorrhage, venous infarct, or other complication identified. 2. Persistent small right mastoid effusion.    Howard Pouch, MD 06/02/2016, 7:08 AM PGY-1, [facility] Family Medicine FPTS Intern pager: [phone], text pages welcome

## 2016-06-03 ENCOUNTER — Telehealth: Payer: Self-pay | Admitting: *Deleted

## 2016-06-03 NOTE — Telephone Encounter (Signed)
I am aware of the events and the diagnosis.  Normally, I would call the patient, discuss and apologize for my incorrect diagnosis.  I am also aware that she has specifically said she was involving an attorney.  I will therefore follow the protocol of not reaching out to her since an attorney is involved.   I am glad that my urgent ophthalomology referral did lead to the correct diagnosis.

## 2016-06-03 NOTE — Telephone Encounter (Signed)
Transitional Care Clinic Post-discharge Follow-Up Phone Call:  Date of Discharge: 06/02/2016 Principal Discharge Diagnosis(es): Dural Venous Sinus Thrombosis Post-discharge Communication: Attempt #1 to reach patient and complete post-discharge follow-up phone call. Call placed to #[phone]; unable to reach patient. HIPPA compliant voicemail left requesting return call. Call Completed: No                     Kinnie Feil, RN, BSN

## 2016-06-04 ENCOUNTER — Telehealth: Payer: Self-pay | Admitting: Family Medicine

## 2016-06-04 DIAGNOSIS — G08 Intracranial and intraspinal phlebitis and thrombophlebitis: Secondary | ICD-10-CM

## 2016-06-04 NOTE — Telephone Encounter (Signed)
Pt brought in [facility] paperwork for Dr Randolm Idol and Pollie Meyer.  Please contact when completed

## 2016-06-07 NOTE — Telephone Encounter (Signed)
Transitional Care Clinic Post-discharge Follow-Up Phone Call:  Date of Discharge: 06/02/2016 Principal Discharge Diagnosis(es): Dural Venous Thrombosis Post-discharge Communication: Attempt #2 to reach patient and complete post-discharge follow-up phone call. Call placed to #[phone]; unable to reach patient. Left voicemail message requesting return call and reminder of TOC appt tomorrow, 06/08/2016 at 0830. Call Completed: No                     Kinnie Feil, RN, BSN

## 2016-06-07 NOTE — Telephone Encounter (Signed)
Paperwork placed in PCP box. 

## 2016-06-08 ENCOUNTER — Encounter: Payer: Self-pay | Admitting: Family Medicine

## 2016-06-08 ENCOUNTER — Ambulatory Visit (INDEPENDENT_AMBULATORY_CARE_PROVIDER_SITE_OTHER): Payer: Commercial Managed Care - HMO | Admitting: Family Medicine

## 2016-06-08 DIAGNOSIS — F430 Acute stress reaction: Secondary | ICD-10-CM

## 2016-06-08 DIAGNOSIS — H7091 Unspecified mastoiditis, right ear: Secondary | ICD-10-CM

## 2016-06-08 DIAGNOSIS — I82621 Acute embolism and thrombosis of deep veins of right upper extremity: Secondary | ICD-10-CM

## 2016-06-08 DIAGNOSIS — G08 Intracranial and intraspinal phlebitis and thrombophlebitis: Secondary | ICD-10-CM | POA: Diagnosis not present

## 2016-06-08 NOTE — Progress Notes (Signed)
Dr. Pollie Meyer requested a Behavioral Health Consult.   Presenting Issue:  Stress related to medical diagnosis  Report of symptoms:  Patient recently diagnosed with dural venous sinus thrombosis after suffering from a very bad headache for over a week. She feels nervous about her prognosis and fears that this may result in her premature death. Patient also feeling frustrated about misdiagnosis across several medical providers at ED and Family Medicine and states she would like apology from the doctor who saw her. She appreciated referral to behavioral health today as she feels that she could use someone to talk to and to help her with coping.   Assessment / Plan / Recommendations: Patient seems reasonably scared and frustrated about diagnosis. Today I expressed empathy with her fears and frustration, and discussed with her that unfortunately medicine can't always be as exact as we want it to be, and that diagnosis isn't a perfect science. Patient expressed understanding of this. We discussed that she is her own best advocate and I praised her for following through on all doctors' recommendations and for trusting her gut that something was wrong. She seems to have a good social support network and has family members and friends who she can talk to about her worries and fears. She would appreciate support from [facility] to discuss her worries and ways for her to allay these. We scheduled an appointment for March 13th.   Warmhandoff:    Warm Hand Off Completed.

## 2016-06-08 NOTE — Patient Instructions (Addendum)
Call ENT office for an appointment - phone number below We will refer you to ID clinic and hematologist  I will message Dr. Daiva Eves about the best MRI to order and we will get this set up for you  You'll need to switch over to the 20mg  once daily xarelto tablets once you finish the 15mg  twice daily ones - your pharmacy should have the prescription on file  Follow up with me in 2-3 weeks to see how everything is going  Be well, Dr. Pollie Meyer      Follow-up Information    BATES, DWIGHT, MD. Schedule an appointment as soon as possible for a visit in 2 week(s).   Specialty:  Otolaryngology Contact information: [address] [place] [address] [phone]

## 2016-06-08 NOTE — Progress Notes (Signed)
HPI:  Angelica Brown presents for hospital follow up. Patient was hospitalized from 05/27/16 to 06/02/16 with headache and papilledema, found to have dural venous thrombosis and likely right mastoiditis as precipitant. Additionally, had PICC placed in R arm for long term antibiotics, and developed DVT at PICC site (though PICC left in place as it was functional).  Patient now reports she is doing well. Has home health nursing set up, and has been giving herself all her IV antibiotics on schedule. PICC line is functioning well. RN has come out to draw labs.  Headache is overall improving, now just taking tylenol for the pain. She is taking xarelto 15mg  twice daily right now, with plans to transition to 20mg  daily after 21 days. Does not yet have follow up scheduled with ID, ENT, or hematology.   Would like to return to work on 06/21/16. Feels she will be ready at this point. 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   Has been understandably quite stressed out by this entire ordeal. Wakes up at night frantic, reassured that she is still alive. She has young children. Agreeable to meet with [facility] today for support.  ROS: See HPI.  PMFSH: history of morbid obesity, dural sinus thrombosis, mastoiditis  PHYSICAL EXAM: BP 120/70   Pulse 94   Temp 98.4 F (36.9 C)   Wt (!) 349 lb (158.3 kg)   SpO2 96%   BMI 51.54 kg/m  Gen: NAD pleasant cooperative HEENT: normocephalic, atraumatic, moist mucous membranes, pupils equal round and reactive to light. Tympanic membranes clear bilaterally.  No mastoid tenderness on either side  Heart: regular rate and rhythm, no murmur Lungs: clear to auscultation bilaterally normal work of breathing  Neuro: cranial nerves II-XII tested and intact. Speech normal. Full strength bilat upper and lower ext. Normal FNF.  Ext: RUE with PICC in place, no surrounding erythema. +palpable cord superficially near elbow. No warmth or tenderness.    ASSESSMENT/PLAN:  Dural venous sinus thrombosis Symptomatically improving.  -Continue xarelto, plan to transition to 20mg  daily after completing 21 days of 15mg  twice daily therapy. -Will go ahead and refer to hematology for evaluation of hypercoaguable state - though may not be able to initiate workup right now given acute thrombosis and anticoagulation therapy -Would appreciate recommendations from hematology regarding duration of anticoagulation - suspect she will need lifelong now that she has actually 2 thromboses (DVT + dural sinus thrombosis) -See plan below regarding antibiotics/repeat MRI -After visit, noted recommendations from neurology consult note while inpatient regarding topamax - Topamax dose will be 25mg  BID intially and after one week 25mg  QAM and 50mg  QPM and after another week 50mg  twice daily (this will help apparently with ICP lowering). Currently just taking 25mg  twice daily. Will call patient with updated dosing. -Will complete FMLA paperwork & fax (per patient's request). Also make copy for her to pick up. -follow up with me in 2-3 weeks to ensure all this is coming together as planned.  Mastoiditis of right side Presumed to be the cause of dural sinus thrombosis. Tolerating IV antibiotic regimen well (vancomycin, ceftriaxone) and PO flagyl. -Per last ID note (Dr. Daiva Eves), needs repeat MRI in 1 month. Will message Dr. Daiva Eves about what MRI protocol is best and will plan to set this up for patient. Total 6 weeks IV antibiotics, end date 07/08/16 -no follow up in place yet in ID clinic, will enter referral -appears ENT wanted her to follow up in their office,  patient given phone # to call for Dr. Jenne Pane office  Acute stress reaction Obviously, quite a stressful situation. Offered supportive words today. -Behavioral health integrated care consult today - see separate note for details   DVT of upper extremity (deep vein thrombosis) (HCC) Symptomatically  improving Continue xarelto as above. Referring to hematology as above for evaluation of hypercoaguable state.  FOLLOW UP: Follow up in 2-3 weeks with me for above issues Patient to schedule ENT visit Referring to ID & hematology  [name], MD [facility] Family [facility]

## 2016-06-09 ENCOUNTER — Encounter: Payer: Self-pay | Admitting: Family Medicine

## 2016-06-09 DIAGNOSIS — I82629 Acute embolism and thrombosis of deep veins of unspecified upper extremity: Secondary | ICD-10-CM | POA: Insufficient documentation

## 2016-06-09 DIAGNOSIS — F430 Acute stress reaction: Secondary | ICD-10-CM | POA: Insufficient documentation

## 2016-06-09 NOTE — Telephone Encounter (Signed)
Attempted to reach patient to let her know several things:  - forms are complete. I will fax to phone number listed & place a copy at front desk for her to pick up. - on further review of her hospital records, topamax dosing should gradually increase over the next couple weeks (start with 25mg  BID intially and after one week 25mg  QAM and 50mg  QPM and after another week 50mg  twice daily) - I spoke with on call ID physician (Dr. Luciana Axe) and the order for MRI needs to be MRI brain with and without contrast. I will order this and we will get it scheduled & contact her with the appointment.   No answer, left voicemail asking her to return the call. Please page me when she calls back so that I can speak with her.  Red team, please schedule MRI brain for around March 12 and contact patient with appointment.   Thanks, Latrelle Dodrill, MD

## 2016-06-09 NOTE — Assessment & Plan Note (Addendum)
Presumed to be the cause of dural sinus thrombosis. Tolerating IV antibiotic regimen well (vancomycin, ceftriaxone) and PO flagyl. -Per last ID note (Dr. Daiva Eves), needs repeat MRI in 1 month. Will message Dr. Daiva Eves about what MRI protocol is best and will plan to set this up for patient. Total 6 weeks IV antibiotics, end date 07/08/16 -no follow up in place yet in ID clinic, will enter referral -appears ENT wanted her to follow up in their office, patient given phone # to call for Dr. Jenne Pane office

## 2016-06-09 NOTE — Assessment & Plan Note (Signed)
Obviously, quite a stressful situation. Offered supportive words today. -Behavioral health integrated care consult today - see separate note for details

## 2016-06-09 NOTE — Telephone Encounter (Signed)
FMLA papers placed in RN team box for copying, faxing, scanning Patient needs a paper copy of this left up front Also I am including a letter saying she can return to work  Still waiting to hear back from patient - I need to speak with her about topamax dosing  Thanks Latrelle Dodrill, MD

## 2016-06-09 NOTE — Assessment & Plan Note (Signed)
Symptomatically improving Continue xarelto as above. Referring to hematology as above for evaluation of hypercoaguable state.

## 2016-06-09 NOTE — Progress Notes (Signed)
Talked to [facility] from [facility]. She said Angelica Brown's arm is doing fine.  She does have a knot where the DVT is, but her PICC line is doing well. No swelling or warmth. And she is able to get blood and everything from the PICC.

## 2016-06-09 NOTE — Assessment & Plan Note (Addendum)
Symptomatically improving.  -Continue xarelto, plan to transition to 20mg  daily after completing 21 days of 15mg  twice daily therapy. -Will go ahead and refer to hematology for evaluation of hypercoaguable state - though may not be able to initiate workup right now given acute thrombosis and anticoagulation therapy -Would appreciate recommendations from hematology regarding duration of anticoagulation - suspect she will need lifelong now that she has actually 2 thromboses (DVT + dural sinus thrombosis) -See plan below regarding antibiotics/repeat MRI -After visit, noted recommendations from neurology consult note while inpatient regarding topamax - Topamax dose will be 25mg  BID intially and after one week 25mg  QAM and 50mg  QPM and after another week 50mg  twice daily (this will help apparently with ICP lowering). Currently just taking 25mg  twice daily. Will call patient with updated dosing. -Will complete FMLA paperwork & fax (per patient's request). Also make copy for her to pick up. -follow up with me in 2-3 weeks to ensure all this is coming together as planned.

## 2016-06-10 NOTE — Telephone Encounter (Signed)
Patient informed that FMLA forms were complete and ready for pickup.  Per patient request forms faxed to [phone]. Forms copied for scanning in patient's record.  Original copied placed up front for patient.  Clovis Pu, RN

## 2016-06-11 MED ORDER — TOPIRAMATE 25 MG PO TABS: 50.0000 mg | ORAL_TABLET | Freq: Two times a day (BID) | ORAL | 2 refills | Status: DC

## 2016-06-11 NOTE — Telephone Encounter (Signed)
Spoke with patient about topamax dosing. She has been on 25mg  twice daily for over a week now. As of today, she'll do 25mg  in AM and 50mg  in PM In 1 week she will increase to 50mg  in AM and 50mg  in PM. I will send in rx for 50mg  twice daily  Patient appreciative. Has appointment to follow up with me.  Latrelle Dodrill, MD

## 2016-06-11 NOTE — Addendum Note (Signed)
Addended by: Latrelle Dodrill on: 06/11/2016 02:26 PM   Modules accepted: Orders

## 2016-06-13 ENCOUNTER — Encounter: Payer: Self-pay | Admitting: Student in an Organized Health Care Education/Training Program

## 2016-06-13 DIAGNOSIS — I5022 Chronic systolic (congestive) heart failure: Secondary | ICD-10-CM | POA: Insufficient documentation

## 2016-06-13 HISTORY — DX: Chronic systolic (congestive) heart failure: I50.22

## 2016-06-16 ENCOUNTER — Telehealth: Payer: Self-pay | Admitting: Family Medicine

## 2016-06-16 ENCOUNTER — Telehealth: Payer: Self-pay | Admitting: Pharmacist

## 2016-06-16 NOTE — Telephone Encounter (Signed)
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  Pt unable to get therapeutic vanc levels even though she is consistently taking medication -- Vanc 1500 mg IV q8h and VT was 7.7, then increased to 1750 mg IV q8h and VT was 9.4.  Will d/c Vanc and Rocephin and change to Ceftaroline 600 mg IV q8h. Called Amy, PharmD at [facility], and changed orders. Dr. Daiva Eves in agreement.

## 2016-06-16 NOTE — Telephone Encounter (Signed)
Pt would like to talk to dr Pollie Meyer about her medications.

## 2016-06-16 NOTE — Telephone Encounter (Signed)
Thanks Cassie 

## 2016-06-17 NOTE — Telephone Encounter (Signed)
Returned call to patient - she had questions about why her antibiotics were switched. Reviewed notes - appears vanc troughs were never therapeutic so they changed antibiotics per Dr. Zenaida Niece Dam's recommendation. Offered reassurance to patient about these changes. She has follow up with me on Tuesday. Also has MRI scheduled & ID follow up scheduled.  Patient appreciative Latrelle Dodrill, MD

## 2016-06-19 ENCOUNTER — Encounter: Payer: Self-pay | Admitting: Hematology and Oncology

## 2016-06-19 ENCOUNTER — Telehealth: Payer: Self-pay | Admitting: Hematology and Oncology

## 2016-06-19 NOTE — Telephone Encounter (Signed)
Appt has been scheduled for the pt to see Dr. Pamelia Hoit on 3/15 on at 1pm. Aware to arrive 30 minutes early. Demographics verified. Letter mailed.

## 2016-06-22 ENCOUNTER — Encounter: Payer: Self-pay | Admitting: Family Medicine

## 2016-06-22 ENCOUNTER — Ambulatory Visit: Payer: Commercial Managed Care - HMO | Admitting: Family Medicine

## 2016-06-22 ENCOUNTER — Ambulatory Visit (INDEPENDENT_AMBULATORY_CARE_PROVIDER_SITE_OTHER): Payer: Commercial Managed Care - HMO | Admitting: Family Medicine

## 2016-06-22 DIAGNOSIS — R51 Headache: Secondary | ICD-10-CM

## 2016-06-22 DIAGNOSIS — H7091 Unspecified mastoiditis, right ear: Secondary | ICD-10-CM | POA: Diagnosis not present

## 2016-06-22 DIAGNOSIS — I82621 Acute embolism and thrombosis of deep veins of right upper extremity: Secondary | ICD-10-CM

## 2016-06-22 DIAGNOSIS — G08 Intracranial and intraspinal phlebitis and thrombophlebitis: Secondary | ICD-10-CM | POA: Diagnosis not present

## 2016-06-22 DIAGNOSIS — R519 Headache, unspecified: Secondary | ICD-10-CM

## 2016-06-22 DIAGNOSIS — F430 Acute stress reaction: Secondary | ICD-10-CM

## 2016-06-22 MED ORDER — ONDANSETRON HCL 8 MG PO TABS: 8.0000 mg | ORAL_TABLET | Freq: Three times a day (TID) | ORAL | 0 refills | Status: DC | PRN

## 2016-06-22 MED ORDER — TOPIRAMATE 50 MG PO TABS: 50.0000 mg | ORAL_TABLET | Freq: Two times a day (BID) | ORAL | 3 refills | Status: DC

## 2016-06-22 NOTE — Patient Instructions (Signed)
Stay on blood thinner I'll call you when we have MRI results back  Sent in new prescription for Topamax. New letter for work written today Sent in higher dose of zofran (8mg  every 8 hours as needed for nausea)  Follow up with me in 3 weeks, sooner if needed Call with any questions  Be well, Dr. Pollie Meyer

## 2016-06-22 NOTE — Progress Notes (Signed)
Date of Visit: 06/22/2016   HPI:  Patient presents for follow up.  Mastoiditis - antibiotics were adjusted per home health & Dr. Algis Liming to ceftaroline, due to difficulty with getting therapeutic vanc trough in patient. She is now on the ceftaroline q8h and tolerating this well. Continues to also take flagyl PO. Wants to go back to work tomorrow. Was not able to go back yet as the letter I wrote needs to say that she can return with no restrictions. 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  Has upcoming ENT appointment though is not sure of the day, has it written down at home. Also has upcoming ID appointment to follow up with Dr. Daiva Eves. Flagyl makes her nauseated, has been using zofran as needed (4mg  q8h as needed)  Dural sinus thrombosis & RUE DVT - is now on the xarelto 20mg  tablet once daily. Tolerating well. Her right arm DVT is improving, states she can no longer feel a cord in her arm. PICC line is functioning well. She does have some pressure still in her face but overall headaches have greatly improved. Using tylenol as needed. Currently taking topamax 25mg  in AM and 50mg  in PM. Needs rx for this resent.  Coordination of care - has several upcoming appointments: 3/12 f/u MRI appt 3/13 integrated care f/u 3/14 ID hospital f/u with Dr. Daiva Eves 3/15 hematology/oncology new pt appt with Dr. Pamelia Hoit  ROS: See HPI.  PMFSH: history of morbid obesity, dural sinus thrombosis, RUE DVT, mastoiditis  PHYSICAL EXAM: BP 126/84   Pulse 67   Temp 97.8 F (36.6 C) (Oral)   Ht 5\' 7"  (1.702 m)   Wt (!) 347 lb (157.4 kg)   SpO2 96%   BMI 54.35 kg/m  Gen: no acute distress, pleasant, cooperative HEENT: normocephalic, atraumatic, moist mucous membranes, tympanic membranes clear bilaterally (R tympanic membrane mildly erythematous). No mastoid tenderness on either side. No anterior cervical or supraclavicular lymphadenopathy.  Heart: regular rate and rhythm, no murmur Lungs: clear to  auscultation bilaterally, normal work of breathing  Neuro: alert, grossly nonfocal, speech normal  ASSESSMENT/PLAN:  Dural venous sinus thrombosis Overall improving/stable. Continue xarelto 20mg  daily. New rx for topamax 50mg  twice daily sent in. RUE DVT also seems improving Has upcoming hematology/oncology appointment Follow up MRI in place for 3/12 Follow up with me in 3 weeks.  Mastoiditis of right side Tolerating new antibiotic regimen well, except for nausea from flagyl Increase zofran to 8mg  q8h as needed. Confirmed last QTc within normal limits. Letter written to return to work, also for longer lunch breaks so she can go home to infuse midday dose of ceftaroline. Has follow up with ENT & ID in place. Follow up with me in 3 weeks.  DVT of upper extremity (deep vein thrombosis) (HCC) Improving. Continue xarelto. Has hematology follow up in place.  Acute stress reaction Coping reasonably well. Has integrated care follow up with Boneta Lucks on 3/13.  FOLLOW UP: Follow up in 3 weeks with me for above issues.  [name], MD [facility]

## 2016-06-23 ENCOUNTER — Telehealth: Payer: Self-pay | Admitting: Family Medicine

## 2016-06-23 NOTE — Telephone Encounter (Signed)
Pt needs a letter faxed to supervisor stating how long pt will need extended lunch. Pt states it should be for 6-8 weeks. Fax [phone] Attn Lady Gary ep

## 2016-06-23 NOTE — Telephone Encounter (Signed)
2nd request. Pt needs this faxed to supervisor ASAP. ep

## 2016-06-24 NOTE — Assessment & Plan Note (Signed)
Improving. Continue xarelto. Has hematology follow up in place.

## 2016-06-24 NOTE — Assessment & Plan Note (Signed)
Overall improving/stable. Continue xarelto 20mg  daily. New rx for topamax 50mg  twice daily sent in. RUE DVT also seems improving Has upcoming hematology/oncology appointment Follow up MRI in place for 3/12 Follow up with me in 3 weeks.

## 2016-06-24 NOTE — Assessment & Plan Note (Signed)
Coping reasonably well. Has integrated care follow up with Boneta Lucks on 3/13.

## 2016-06-24 NOTE — Telephone Encounter (Signed)
Spoke with Dorisann Frames. Patient may need to sign a release for [place] to fax to her job directly. I have given the letter to her, and she is going to handle the details of getting the letter to her job. Thanks Tamika! Latrelle Dodrill, MD

## 2016-06-24 NOTE — Assessment & Plan Note (Signed)
Tolerating new antibiotic regimen well, except for nausea from flagyl Increase zofran to 8mg  q8h as needed. Confirmed last QTc within normal limits. Letter written to return to work, also for longer lunch breaks so she can go home to infuse midday dose of ceftaroline. Has follow up with ENT & ID in place. Follow up with me in 3 weeks.

## 2016-06-24 NOTE — Telephone Encounter (Signed)
Verbal consent was given to Maple Mirza and Angelica Brown on 06-24-16. Letter was faxed. ep

## 2016-06-24 NOTE — Telephone Encounter (Signed)
Letter printed, will have it faxed ASAP this morning. Please let patient know this has been handled.  Thanks Latrelle Dodrill, MD

## 2016-06-24 NOTE — Telephone Encounter (Signed)
Left patient a voicemail stating she will need to sigh a release of information to fax to her job.  However if she returns nurse call; a verbal consent with two staff members will also be acceptable.  Will wait for return call from patient.  Clovis Pu, RN

## 2016-06-28 ENCOUNTER — Ambulatory Visit (HOSPITAL_COMMUNITY)
Admission: RE | Admit: 2016-06-28 | Discharge: 2016-06-28 | Disposition: A | Discharge status: Home / Self Care | Payer: Commercial Managed Care - HMO | Source: Ambulatory Visit | Attending: Family Medicine | Admitting: Family Medicine

## 2016-06-28 DIAGNOSIS — G08 Intracranial and intraspinal phlebitis and thrombophlebitis: Secondary | ICD-10-CM

## 2016-06-28 DIAGNOSIS — Z86718 Personal history of other venous thrombosis and embolism: Secondary | ICD-10-CM | POA: Insufficient documentation

## 2016-06-28 MED ORDER — GADOBENATE DIMEGLUMINE 529 MG/ML IV SOLN: 20.0000 mL | Freq: Once | INTRAVENOUS | Status: DC

## 2016-06-29 ENCOUNTER — Telehealth: Payer: Self-pay | Admitting: Family Medicine

## 2016-06-29 ENCOUNTER — Ambulatory Visit: Payer: Commercial Managed Care - HMO

## 2016-06-29 NOTE — Telephone Encounter (Signed)
Called patient to discuss MRI results - great news. MRI shows substantial resolution of dural sinus thrombosis, possibly still small area of flow restriction but overall much improved. Still has small right mastoid effusion.  She has follow up with ENT today. Also follow up in place tomorrow with Dr. Daiva Eves of ID.  Patient very appreciative of the call and is excited to hear this news.  Latrelle Dodrill, MD

## 2016-06-29 NOTE — Telephone Encounter (Signed)
Pt's potassium was 5.6 on Monday. ep

## 2016-06-30 ENCOUNTER — Encounter: Payer: Self-pay | Admitting: Infectious Disease

## 2016-06-30 ENCOUNTER — Ambulatory Visit (INDEPENDENT_AMBULATORY_CARE_PROVIDER_SITE_OTHER): Payer: Commercial Managed Care - HMO | Admitting: Infectious Disease

## 2016-06-30 VITALS — BP 166/79 | HR 84 | Temp 97.4°F | Ht 69.0 in | Wt 348.0 lb

## 2016-06-30 DIAGNOSIS — I82621 Acute embolism and thrombosis of deep veins of right upper extremity: Secondary | ICD-10-CM

## 2016-06-30 DIAGNOSIS — I82611 Acute embolism and thrombosis of superficial veins of right upper extremity: Secondary | ICD-10-CM | POA: Diagnosis not present

## 2016-06-30 DIAGNOSIS — H5500 Unspecified nystagmus: Secondary | ICD-10-CM | POA: Insufficient documentation

## 2016-06-30 DIAGNOSIS — R42 Dizziness and giddiness: Secondary | ICD-10-CM

## 2016-06-30 DIAGNOSIS — H7091 Unspecified mastoiditis, right ear: Secondary | ICD-10-CM

## 2016-06-30 DIAGNOSIS — G08 Intracranial and intraspinal phlebitis and thrombophlebitis: Secondary | ICD-10-CM

## 2016-06-30 HISTORY — DX: Unspecified nystagmus: H55.00

## 2016-06-30 NOTE — Progress Notes (Signed)
Chief complaint: vertiginous symptoms yesterday and fullness in ear   Subjective:    Patient ID: Angelica Brown, female    DOB: 12-21-1975, 41 y.o.   MRN: [id number]  HPI  41 year old African American lady who was admitted to the hospital in February and found to have dural vein thrombosis. She had a mastoid effusion and parenchymal enhancement throughout brain. She also had mucosal thickening of anterior ethmoid air cells and of the right frontal sinus. LP was attempted in ED but unsuccessful. Blood cultures taken but were negative. She was placed on vancomycin, CTX and oral flagyl and tolerated these well though we had a great deal of difficulty obtaining therapeutic vancomycin levels prompting change to IV Teflaro and oral flagyl. Her 6 week IV abx course is to end on 07/08/16. Her repeat MRI shows near complete resolution of dural vein sinus, persistent right mastoid effusion. Parenchymal disease is resolved.  She did develop at DVT near PIV that she had placed in the hospital. She was treated with anticoagulants for this entire time as well and there is indeed concern for hypercoaguable state as well. She was seen by ENT in house and in followup (Dr. Jenne Pane). Patient is still having drainage she states and feels fluid build up behind ear. Yesterday she had vertiginous symptoms while lying in bed.  Tympanostomy is being considered.  Past Medical History:  Diagnosis Date  . CHOLELITHIASIS   . Dermatophytosis of nail   . 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 GERD (gastroesophageal reflux disease)   . Gestational diabetes    "w/all 4 pregnancies"  . Heart murmur   . Hypertension   . Metrorrhagia   . Migraine    "at least twice/month" (05/28/2016)  . Nystagmus 06/30/2016  . OBESITY, MORBID   . OBESITY, NOS   . Pneumonia 11/2014  . POSTURAL LIGHTHEADEDNESS   . Sickle cell trait (HCC)   .  SYNCOPE   . Throat pain   . TOBACCO DEPENDENCE   . URI   . Urinary frequency     Past Surgical History:  Procedure Laterality Date  . BARTHOLIN GLAND CYST EXCISION Bilateral 2004  . EXCISIONAL HEMORRHOIDECTOMY  2006   "removed a blood clot from one"  . TUBAL LIGATION  2007  . WISDOM TOOTH EXTRACTION  1998    Family History  Problem Relation Age of Onset  . Breast cancer Mother   . Cancer Father     MELANOMA  . Diabetes Father   . Diabetes Paternal Grandmother   . Diabetes Paternal Grandfather       Social History   Social History  . Marital status: Divorced    Spouse name: N/A  . Number of children: N/A  . Years of education: N/A   Social History Main Topics  . Smoking status: Current Every Day Smoker    Packs/day: 0.50    Years: 18.00    Types: Cigarettes  . Smokeless tobacco: Never Used  . Alcohol use No  . Drug use: No  . Sexual activity: Yes    Birth control/ protection: Surgical   Other Topics Concern  . None   Social History Narrative  . None    Allergies  Allergen Reactions  . Ampicillin Shortness Of Breath    Throat swelling Throat swelling  . Strawberry Extract Itching, Swelling and Anaphylaxis  . Cephalexin Swelling    Itching,hives  . Prednisone  Nausea And Vomiting, Other (See Comments) and Rash    Cramping in legs, could barely walk Cramping in legs, could barely walk     Current Outpatient Prescriptions:  .  ceftaroline (TEFLARO) IVPB, Inject 600 mg into the vein every 8 (eight) hours., Disp: , Rfl:  .  metroNIDAZOLE (FLAGYL) 500 MG tablet, Take 1 tablet (500 mg total) by mouth every 6 (six) hours., Disp: 148 tablet, Rfl: 0 .  ondansetron (ZOFRAN) 8 MG tablet, Take 1 tablet (8 mg total) by mouth every 8 (eight) hours as needed for nausea or vomiting., Disp: 30 tablet, Rfl: 0 .  rivaroxaban (XARELTO) 20 MG TABS tablet, Take 1 tablet (20 mg total) by mouth daily with supper., Disp: 30 tablet, Rfl: 0 .  topiramate (TOPAMAX) 50 MG  tablet, Take 1 tablet (50 mg total) by mouth 2 (two) times daily., Disp: 60 tablet, Rfl: 3    Review of Systems  Constitutional: Negative for chills, diaphoresis and fever.  HENT: Negative for congestion, hearing loss, sore throat and tinnitus.   Respiratory: Negative for cough, shortness of breath and wheezing.   Cardiovascular: Negative for chest pain, palpitations and leg swelling.  Gastrointestinal: Negative for abdominal pain, blood in stool, constipation, diarrhea, nausea and vomiting.  Genitourinary: Negative for dysuria, flank pain and hematuria.  Musculoskeletal: Negative for back pain and myalgias.  Skin: Negative for rash.  Neurological: Positive for dizziness. Negative for weakness and headaches.  Hematological: Does not bruise/bleed easily.  Psychiatric/Behavioral: Negative for suicidal ideas. The patient is not nervous/anxious.        Objective:   Physical Exam  Constitutional: She is oriented to person, place, and time. She appears well-developed and well-nourished. No distress.  HENT:  Head: Normocephalic and atraumatic.  Mouth/Throat: No oropharyngeal exudate.  Eyes: Conjunctivae and EOM are normal. No scleral icterus.  Neck: Normal range of motion. Neck supple.  Cardiovascular: Normal rate, regular rhythm and normal heart sounds.  Exam reveals no gallop and no friction rub.   No murmur heard. Pulmonary/Chest: Effort normal. No respiratory distress. She has no wheezes.  Abdominal: Soft. She exhibits no distension.  Musculoskeletal: She exhibits no edema or tenderness.  Neurological: She is alert and oriented to person, place, and time. She exhibits normal muscle tone. Coordination normal.  Skin: Skin is warm and dry. No rash noted. She is not diaphoretic. No erythema. No pallor.  Psychiatric: She has a normal mood and affect. Her behavior is normal. Judgment and thought content normal.   She did not have return of vertiginous symptoms with dix hallpike  maneuver  She was not orthostatic with BP, or HR or symptoms.        Assessment & Plan:   Dural vein thrombosis: I suspect this was combination of infection + hypercoagulable state.  Her DVT is resolving and parenchymal brain disease is resolved.    I doubt MRSA or Coag NEg staph were players here so I am not esp worried about time spent with subtherapeutic vancomycin levels. We will let her finish her IV abx on the 22nd and have PICC pulled  I would suspect sinuses as likely source. She has ear effusion and tympanostomy being considered. Would defer to Dr. Jenne Pane but I would err on side of any intervention we can do to prevent recurrent infections that may have precipitated this.  DVT: on xarelto for this and dural sinus thrombosis. Heme onc to workup for hypercoagulable state and her leukocytosis which has been chronic.   I spent greater  than 25 minutes with the patient including greater than 50% of time in face to face counsel of the patient re her Dural vein thrombosis, DVT, her brain infection, ear effusion, mastoid effusion, vertigo, hypercoaguable state and in coordination of her care.   Vertiginous symptoms: I would suspect this has to do with her inner ear pathology. Defer to ENT and primary

## 2016-07-01 ENCOUNTER — Telehealth: Payer: Self-pay | Admitting: Hematology and Oncology

## 2016-07-01 ENCOUNTER — Ambulatory Visit (HOSPITAL_BASED_OUTPATIENT_CLINIC_OR_DEPARTMENT_OTHER): Payer: Commercial Managed Care - HMO | Admitting: Hematology and Oncology

## 2016-07-01 ENCOUNTER — Ambulatory Visit (HOSPITAL_BASED_OUTPATIENT_CLINIC_OR_DEPARTMENT_OTHER): Payer: Commercial Managed Care - HMO

## 2016-07-01 ENCOUNTER — Encounter: Payer: Self-pay | Admitting: Hematology and Oncology

## 2016-07-01 VITALS — BP 143/80 | HR 64 | Temp 97.5°F | Resp 18 | Ht 69.0 in | Wt 348.2 lb

## 2016-07-01 DIAGNOSIS — G08 Intracranial and intraspinal phlebitis and thrombophlebitis: Secondary | ICD-10-CM

## 2016-07-01 DIAGNOSIS — Z7901 Long term (current) use of anticoagulants: Secondary | ICD-10-CM

## 2016-07-01 DIAGNOSIS — R51 Headache: Secondary | ICD-10-CM

## 2016-07-01 LAB — CBC WITH DIFFERENTIAL/PLATELET
BASO%: 0.7 % (ref 0.0–2.0)
Basophils Absolute: 0.1 10*3/uL (ref 0.0–0.1)
EOS%: 2.1 % (ref 0.0–7.0)
Eosinophils Absolute: 0.2 10*3/uL (ref 0.0–0.5)
HCT: 35.8 % (ref 34.8–46.6)
HGB: 12.7 g/dL (ref 11.6–15.9)
LYMPH#: 3.9 10*3/uL — AB (ref 0.9–3.3)
LYMPH%: 34.6 % (ref 14.0–49.7)
MCH: 28.4 pg (ref 25.1–34.0)
MCHC: 35.5 g/dL (ref 31.5–36.0)
MCV: 80.1 fL (ref 79.5–101.0)
MONO#: 1 10*3/uL — ABNORMAL HIGH (ref 0.1–0.9)
MONO%: 9.2 % (ref 0.0–14.0)
NEUT%: 53.4 % (ref 38.4–76.8)
NEUTROS ABS: 6 10*3/uL (ref 1.5–6.5)
Platelets: 356 10*3/uL (ref 145–400)
RBC: 4.47 10*6/uL (ref 3.70–5.45)
RDW: 16.3 % — ABNORMAL HIGH (ref 11.2–14.5)
WBC: 11.3 10*3/uL — AB (ref 3.9–10.3)
nRBC: 0 % (ref 0–0)

## 2016-07-01 NOTE — Progress Notes (Signed)
[facility] CONSULT NOTE  Patient Care Team: Latrelle Dodrill, MD as PCP - General (Pediatrics)  CHIEF COMPLAINTS/PURPOSE OF CONSULTATION:  Dural venous sinus thrombosis  HISTORY OF PRESENTING ILLNESS:  Angelica Brown 41 y.o. female is here because of recent diagnosis of dural venous sinus thrombosis. Patient was hospitalized 05/27/2016 with severe intractable headaches that were going on for about a month. She was initially treated for migraines without much benefit. She had an ophthalmology evaluation who detected a prominent rectal veins and obtain a CT of the head. This revealed dural venous sinus thrombosis. She was placed on anticoagulation with IV heparin and later switched to Xarelto. Is tolerating Xarelto extremely well. She has been sent to [place] for discussion regarding cause of the blood clot as well as to determine the duration of anticoagulation.  I reviewed her records extensively and collaborated the history with the patient.   MEDICAL HISTORY:  Past Medical History:  Diagnosis Date  . CHOLELITHIASIS   . Dermatophytosis of nail   . 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 GERD (gastroesophageal reflux disease)   . Gestational diabetes    "w/all 4 pregnancies"  . Heart murmur   . Hypertension   . Metrorrhagia   . Migraine    "at least twice/month" (05/28/2016)  . Nystagmus 06/30/2016  . OBESITY, MORBID   . OBESITY, NOS   . Pneumonia 11/2014  . POSTURAL LIGHTHEADEDNESS   . Sickle cell trait (HCC)   . SYNCOPE   . Throat pain   . TOBACCO DEPENDENCE   . URI   . Urinary frequency     SURGICAL HISTORY: Past Surgical History:  Procedure Laterality Date  . BARTHOLIN GLAND CYST EXCISION Bilateral 2004  . EXCISIONAL HEMORRHOIDECTOMY  2006   "removed a blood clot from one"  . TUBAL LIGATION  2007  . WISDOM TOOTH EXTRACTION  1998    SOCIAL HISTORY: Social  History   Social History  . Marital status: Divorced    Spouse name: N/A  . Number of children: N/A  . Years of education: N/A   Occupational History  . Not on file.   Social History Main Topics  . Smoking status: Current Every Day Smoker    Packs/day: 0.50    Years: 18.00    Types: Cigarettes  . Smokeless tobacco: Never Used  . Alcohol use No  . Drug use: No  . Sexual activity: Yes    Birth control/ protection: Surgical   Other Topics Concern  . Not on file   Social History Narrative  . No narrative on file    FAMILY HISTORY:Lots of family members with blood clots including her father and grandfather Family History  Problem Relation Age of Onset  . Breast cancer Mother   . Cancer Father     MELANOMA  . Diabetes Father   . Diabetes Paternal Grandmother   . Diabetes Paternal Grandfather     ALLERGIES:  is allergic to ampicillin; strawberry extract; cephalexin; and prednisone.  MEDICATIONS:  Current Outpatient Prescriptions  Medication Sig Dispense Refill  . ceftaroline (TEFLARO) IVPB Inject 600 mg into the vein every 8 (eight) hours.    . metroNIDAZOLE (FLAGYL) 500 MG tablet Take 1 tablet (500 mg total) by mouth every 6 (six) hours. 148 tablet 0  . ondansetron (ZOFRAN) 8 MG tablet Take 1 tablet (8 mg total) by mouth every 8 (eight) hours as needed for  nausea or vomiting. 30 tablet 0  . rivaroxaban (XARELTO) 20 MG TABS tablet Take 1 tablet (20 mg total) by mouth daily with supper. 30 tablet 0  . topiramate (TOPAMAX) 50 MG tablet Take 1 tablet (50 mg total) by mouth 2 (two) times daily. 60 tablet 3   No current facility-administered medications for this visit.     REVIEW OF SYSTEMS:   Constitutional: Denies fevers, chills or abnormal night sweats Eyes: Denies blurriness of vision, double vision or watery eyes Ears, nose, mouth, throat, and face: Denies mucositis or sore throat Respiratory: Denies cough, dyspnea or wheezes Cardiovascular: Denies palpitation,  chest discomfort or lower extremity swelling Gastrointestinal:  Denies nausea, heartburn or change in bowel habits Skin: Denies abnormal skin rashes Lymphatics: Denies new lymphadenopathy or easy bruising Neurological:Denies numbness, tingling or new weaknesses Behavioral/Psych: Mood is stable, no new changes  Breast:  Denies any palpable lumps or discharge All other systems were reviewed with the patient and are negative.  PHYSICAL EXAMINATION: ECOG PERFORMANCE STATUS: 1 - Symptomatic but completely ambulatory  Vitals:   07/01/16 1332  BP: (!) 143/80  Pulse: 64  Resp: 18  Temp: 97.5 F (36.4 C)   Filed Weights   07/01/16 1332  Weight: (!) 348 lb 4 oz (158 kg)    GENERAL:alert, no distress and comfortable SKIN: skin color, texture, turgor are normal, no rashes or significant lesions EYES: normal, conjunctiva are pink and non-injected, sclera clear OROPHARYNX:no exudate, no erythema and lips, buccal mucosa, and tongue normal  NECK: supple, thyroid normal size, non-tender, without nodularity LYMPH:  no palpable lymphadenopathy in the cervical, axillary or inguinal LUNGS: clear to auscultation and percussion with normal breathing effort HEART: regular rate & rhythm and no murmurs and no lower extremity edema ABDOMEN:abdomen soft, non-tender and normal bowel sounds Musculoskeletal:no cyanosis of digits and no clubbing  PSYCH: alert & oriented x 3 with fluent speech NEURO: no focal motor/sensory deficits  LABORATORY DATA:  I have reviewed the data as listed Lab Results  Component Value Date   WBC 11.3 (H) 07/01/2016   HGB 12.7 07/01/2016   HCT 35.8 07/01/2016   MCV 80.1 07/01/2016   PLT 356 07/01/2016   Lab Results  Component Value Date   NA 136 06/01/2016   K 3.8 06/01/2016   CL 101 06/01/2016   CO2 24 06/01/2016    RADIOGRAPHIC STUDIES: I have personally reviewed the radiological reports and agreed with the findings in the report.  ASSESSMENT AND PLAN:  Dural  venous sinus thrombosis Dural venous sinus thrombosis: Sudden onset of headaches treated initially for migraines but when she was found to have profoundly enlarged retinal veins, she underwent a CT of the head which revealed dural venous sinus thrombosis. She was initially treated with Lovenox and later switched to oral Xarelto. She has been tolerating it fairly well. Headaches have improved significantly. She was sent to [place] for discussion regarding hypercoagulability workup and to determine the duration of treatment.  I discussed with the patient risk factors for blood clots.  Inherited risk factors include: 1. Factor V Leiden mutation 2. 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 Protein S deficiency  4. Protein C deficiency  5. Antithrombin deficiency  Acquired risk factors include: 1. Antiphospholipid antibody syndrome 2. Tobacco use 3. Obesity 4. Sedentary behavior including postoperative state   Workup recommended: Bloodwork to evaluate for the inherited factors mentioned above along with antiphospholipid antibodies. Return to clinic in one to 4 weeks to discuss the results of the tests  Leukocytosis:  I would like to do a CBC with differential today along with ANA since her PCP was concerned about lupus.      All questions were answered. The patient knows to call the clinic with any problems, questions or concerns.    Sabas Sous, MD 07/01/16

## 2016-07-01 NOTE — Assessment & Plan Note (Signed)
Dural venous sinus thrombosis: Sudden onset of headaches treated initially for migraines but when she was found to have profoundly enlarged retinal veins, she underwent a CT of the head which revealed dural venous sinus thrombosis. She was initially treated with Lovenox and later switched to oral Xarelto. She has been tolerating it fairly well. Headaches have improved significantly. She was sent to [place] for discussion regarding hypercoagulability workup and to determine the duration of treatment.  I discussed with the patient risk factors for blood clots.  Inherited risk factors include: 1. Factor V Leiden mutation 2. 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 Protein S deficiency  4. Protein C deficiency  5. Antithrombin deficiency  Acquired risk factors include: 1. Antiphospholipid antibody syndrome 2. Tobacco use 3. Obesity 4. Sedentary behavior including postoperative state   Workup recommended: Bloodwork to evaluate for the inherited factors mentioned above along with antiphospholipid antibodies. Return to clinic in one to 4 weeks to discuss the results of the tests  Leukocytosis: I would like to do a CBC with differential today along with ANA since her PCP was concerned about lupus.

## 2016-07-01 NOTE — Telephone Encounter (Signed)
Gave patient avs report and appointments for April  °

## 2016-07-02 LAB — PROTEIN C ACTIVITY: PROTEIN C ACTIVITY: 134 % (ref 73–180)

## 2016-07-02 LAB — ANTITHROMBIN III: ANTITHROMBIN ACTIVITY: 103 % (ref 75–135)

## 2016-07-02 LAB — ANTINUCLEAR ANTIBODIES, IFA: ANTINUCLEAR ANTIBODIES, IFA: NEGATIVE

## 2016-07-02 LAB — PROTEIN S, TOTAL: Protein S, Total: 89 % (ref 60–150)

## 2016-07-02 LAB — HOMOCYSTEINE: HOMOCYSTEINE: 10.6 umol/L (ref 0.0–15.0)

## 2016-07-02 LAB — PROTEIN S ACTIVITY: PROTEIN S ACTIVITY: 60 % — AB (ref 63–140)

## 2016-07-03 LAB — BETA-2-GLYCOPROTEIN I ABS, IGG/M/A: Beta-2 Glyco 1 IgA: <9 GPI IgA units (ref 0–25)

## 2016-07-03 LAB — PROTEIN C, TOTAL: PROTEIN C ANTIGEN: 94 % (ref 60–150)

## 2016-07-04 LAB — LUPUS ANTICOAGULANT PANEL
DRVVT MIX: 54 s — AB (ref 0.0–47.0)
PTT-LA: 36.1 s (ref 0.0–51.9)
dRVVT Confirm: 1.7 ratio — ABNORMAL HIGH (ref 0.8–1.2)
dRVVT: 75.8 s — ABNORMAL HIGH (ref 0.0–47.0)

## 2016-07-04 LAB — CARDIOLIPIN ANTIBODIES, IGG, IGM, IGA: ANTICARDIOLIPIN IGM: 11 [MPL'U]/mL (ref 0–12)

## 2016-07-05 LAB — PROTHROMBIN GENE MUTATION

## 2016-07-05 LAB — FACTOR 5 LEIDEN

## 2016-07-08 ENCOUNTER — Telehealth: Payer: Self-pay | Admitting: Family Medicine

## 2016-07-08 NOTE — Telephone Encounter (Signed)
Will forward to PCP 

## 2016-07-08 NOTE — Telephone Encounter (Signed)
Angelica Brown had an order to pull pic line today. Pt has return of headaches. Pt took oxycodone last night and tylenol today. Should she discharge pt or keep pt for another week? ep

## 2016-07-13 ENCOUNTER — Telehealth: Payer: Self-pay | Admitting: Family Medicine

## 2016-07-13 NOTE — Telephone Encounter (Signed)
PIC line was removed Thursday March 22.  Pt no longer needs 2 hr lunches.  Need letter from dr stating 2 hr lunch is no longer needed.    Angelica Brown  [phone]

## 2016-07-13 NOTE — Telephone Encounter (Signed)
Pt is suppose to see dr Pollie Meyer this week.  There are no appts until May.  Pt says she has a very rare condition and must see Dr Pollie Meyer before may. Please advise

## 2016-07-14 NOTE — Telephone Encounter (Signed)
I am waiting to see if patient can come in this week to see me before deciding whether she needs continued [facility] RN. Latrelle Dodrill, MD

## 2016-07-14 NOTE — Telephone Encounter (Signed)
Attempted to reach patient to discuss being seen this week but she did not answer. LVM for her to call back. If she can come in tomorrow morning I could see her some time then (I have administrative time tomorrow).  I also need to clarify with her if she wants [place] to fax this letter stating she no longer needs long lunch breaks, or whether she wanted [place] to pick it up.  Please let me know if patient can come in and be seen tomorrow morning Latrelle Dodrill, MD

## 2016-07-15 ENCOUNTER — Encounter: Payer: Self-pay | Admitting: Family Medicine

## 2016-07-15 ENCOUNTER — Telehealth: Payer: Self-pay | Admitting: Family Medicine

## 2016-07-15 ENCOUNTER — Ambulatory Visit (INDEPENDENT_AMBULATORY_CARE_PROVIDER_SITE_OTHER): Payer: Commercial Managed Care - HMO | Admitting: Family Medicine

## 2016-07-15 VITALS — BP 140/70 | HR 67 | Temp 97.7°F | Ht 69.0 in | Wt 350.4 lb

## 2016-07-15 DIAGNOSIS — G08 Intracranial and intraspinal phlebitis and thrombophlebitis: Secondary | ICD-10-CM | POA: Diagnosis not present

## 2016-07-15 MED ORDER — RIVAROXABAN 20 MG PO TABS: 20.0000 mg | ORAL_TABLET | Freq: Every day | ORAL | 2 refills | Status: DC

## 2016-07-15 MED ORDER — LEVOFLOXACIN 500 MG PO TABS: 500.0000 mg | ORAL_TABLET | Freq: Every day | ORAL | 0 refills | Status: DC

## 2016-07-15 NOTE — Telephone Encounter (Signed)
Patient came in for appointment this morning. Handled all issues in this visit. Angelica Dodrill, MD

## 2016-07-15 NOTE — Progress Notes (Signed)
Date of Visit: 07/15/2016   HPI:  Patient presents for routine follow up. She was not able to get scheduled a regular clinic with me, so I worked her in today during some administrative time.  Updates since last visit: 3/13 - ENT appointment with Dr. Jenne Pane - plan at that time was to consider tympanostomy tube if hypercoaguble workup yielded no prothrombotic conditions. Patient to follow up with ENT if hematology evaluation negative.  3/14 - ID appointment with Dr. Daiva Eves - ok'd for stopping antibiotics on 3/22. Dr. Algis Liming felt more strongly about considering tympanostomy tubes.  3/15 - Hematology appointment with Dr. Pamelia Hoit. Lots of labs drawn to evaluate for hypercoaguble condtiion  3/22 - PICC line pulled by [facility] team, IV antibiotics completed  Patient reports she is having lots of ear symptoms. Hears loud ringing in R ear, also cloudy/muffled sounds in the R ear. Ear aches some. Has also started having thick green mucous from her nose. This began the day before her IV antibiotics were complete. Mucous only comes out of the L nostril. Headaches have also resumed but are MUCH less severe than previously. She is taking topamax 50mg  twice daily. Also is still taking flagyl 500mg . This was written for four times a day , but she has actually been taking it three times daily.  Still on xarelto. Needs refill of this sent in. Additionally, she got offered a job in [place] that may start up in a few months. Was advised by Dr. Daiva Eves not to fly right now. I agree with this warning.  ROS: See HPI.  PMFSH: history of dural sinus thrombosis, R mastoiditis  PHYSICAL EXAM: BP 140/70 (BP Location: Left Wrist, Patient Position: Sitting, Cuff Size: Normal)   Pulse 67   Temp 97.7 F (36.5 C) (Oral)   Ht 5\' 9"  (1.753 m)   Wt (!) 350 lb 6.4 oz (158.9 kg)   LMP 06/29/2016   SpO2 94%   BMI 51.75 kg/m  Gen: no acute distress, pleasant, cooperative, well appearing HEENT: normocephalic, atraumatic,  moist mucous membranes. Oropharynx clear. Nares with some bogginess of turbinates. Tympanic membranes clear bilaterally. No significant nasal drainage. Sinuses nontender to palpation  Lungs: normal work of breathing  Neuro: grossly nonfocal, speech and gait normal  ASSESSMENT/PLAN:  Dural venous sinus thrombosis Since stopping IV antibiotics, has had recurrence of green mucous in left nare. Now complaining of symptoms in the right ear as well. Headache has returned to a lesser degree than previously. Plan: - Repeat brain MRI to ensure no return of previously resolved thrombosis, also to assess sinuses again - instructed patient to schedule another ENT visit ASAP given recurrence of sinus symptoms.  - I will get in touch with ID physician Dr. Daiva Eves about how long she needs to continue on the flagyl. I suspect she has extra because she's only been taking it three times daily (it makes her quite nauseated) - She is afebrile and sinuses are nontender to palpation on exam so I am going to hold off on adding additional antibiotics right now, unless ENT or Dr. Daiva Eves feel strongly that we should. - keep follow up appointment with hematology - xarelto refilled, continue topamax - provided note saying she no longer needs longer lunch breaks as she is off IV antibiotics.  - follow up with me in 1 month, sooner if worsening. Patient knows to call with any questions.  FOLLOW UP: Follow up in 1 mo for above. Patient to schedule follow up  with ENT ASAP Keep appts with hematology  [name], MD [facility]

## 2016-07-15 NOTE — Patient Instructions (Signed)
Please schedule a follow up visit with Dr. Jenne Pane as soon as you can get in there.  We will call you with an MRI appointment.   Don't fly on airplanes for now.  Continue topamax and xarelto.  Call with any questions.  See me in 1 month, sooner if needed.  Be well, Dr. Pollie Meyer

## 2016-07-15 NOTE — Telephone Encounter (Signed)
Per patient this morning, HH has already signed off. No further action required. Latrelle Dodrill, MD

## 2016-07-15 NOTE — Telephone Encounter (Signed)
I just spoke with Dr. Daiva Eves about this patient today after her visit. He recommends adding levaquin 500mg  po daily for 10 days given her sinus symptoms and the unusual presentation she previously had. Agrees with MRI and close ENT follow up. He said she can stop the flagyl.  I called patient to update her on these developments but she did not answer. LVM asking her to return the call.   When patient calls back please let her know: - I talked with Dr. Daiva Eves - She can stop the flagyl - She needs to start an oral antibiotic for 10 days by mouth (levaquin). I have sent this in to her pharmacy. - She should still follow up with ENT and get an MRI as we discussed this morning - We will call her with that MRI appointment.  If patient has any questions or wants to speak with me you can page me. I am out of the office this afternoon but will try to be available by pager should she have questions.  Thanks, Latrelle Dodrill, MD

## 2016-07-15 NOTE — Assessment & Plan Note (Addendum)
Since stopping IV antibiotics, has had recurrence of green mucous in left nare. Now complaining of symptoms in the right ear as well. Headache has returned to a lesser degree than previously. Plan: - Repeat brain MRI to ensure no return of previously resolved thrombosis, also to assess sinuses again - instructed patient to schedule another ENT visit ASAP given recurrence of sinus symptoms.  - I will get in touch with ID physician Dr. Daiva Eves about how long she needs to continue on the flagyl. I suspect she has extra because she's only been taking it three times daily (it makes her quite nauseated) - She is afebrile and sinuses are nontender to palpation on exam so I am going to hold off on adding additional antibiotics right now, unless ENT or Dr. Daiva Eves feel strongly that we should. - keep follow up appointment with hematology - xarelto refilled, continue topamax - provided note saying she no longer needs longer lunch breaks as she is off IV antibiotics.  - follow up with me in 1 month, sooner if worsening. Patient knows to call with any questions.

## 2016-07-19 NOTE — Telephone Encounter (Signed)
Attempted again to reach patient. No answer. Left another VM asking her to call back. Latrelle Dodrill, MD

## 2016-07-20 ENCOUNTER — Ambulatory Visit: Payer: Commercial Managed Care - HMO | Admitting: Family Medicine

## 2016-07-22 NOTE — Telephone Encounter (Signed)
Again called patient, no answer, left another voicemail Latrelle Dodrill, MD

## 2016-07-22 NOTE — Telephone Encounter (Signed)
I tried again to reach patient to discuss her concerns No answer - LVM asking her to call back tomorrow to discuss  Please page me when she calls back so I can speak with her  Latrelle Dodrill, MD

## 2016-07-22 NOTE — Telephone Encounter (Signed)
Pt just picked up the RX for the levaquin. She has read the side effects and is not comfortable taking it. Please advise

## 2016-07-23 NOTE — Telephone Encounter (Signed)
Attempted again to reach patient. No answer. Cherylyn Sundby J Rhiann Boucher, MD  

## 2016-07-30 ENCOUNTER — Ambulatory Visit (HOSPITAL_COMMUNITY): Admission: RE | Admit: 2016-07-30 | Payer: Commercial Managed Care - HMO | Source: Ambulatory Visit

## 2016-08-02 ENCOUNTER — Ambulatory Visit (HOSPITAL_BASED_OUTPATIENT_CLINIC_OR_DEPARTMENT_OTHER): Payer: Commercial Managed Care - HMO | Admitting: Hematology and Oncology

## 2016-08-02 ENCOUNTER — Encounter: Payer: Self-pay | Admitting: Emergency Medicine

## 2016-08-02 ENCOUNTER — Encounter: Payer: Self-pay | Admitting: Hematology and Oncology

## 2016-08-02 DIAGNOSIS — G08 Intracranial and intraspinal phlebitis and thrombophlebitis: Secondary | ICD-10-CM

## 2016-08-02 NOTE — Assessment & Plan Note (Addendum)
Dural venous sinus thrombosis: Sudden onset of headaches treated initially for migraines but when she was found to have profoundly enlarged retinal veins, she underwent a CT of the head which revealed dural venous sinus thrombosis. She was initially treated with Lovenox and later switched to oral Xarelto. She has been tolerating it fairly well. Headaches have improved significantly. She was sent to [place] for discussion regarding hypercoagulability workup and to determine the duration of treatment.  Lab work review: 1. Factor V Leiden mutation: Normal 2. Prothrombin gene G20210A: Normal 3. Protein S antigen: 89%; protein S functional 60%, protein S total 89% : Normal 4. Protein C antigen: 94% , protein C functional 134%: Normal 5. Antithrombin deficiency: 103%: Normal 6. Antiphospholipid : Even though Beta 2 glycoprotein 1 antibodies and anticardiolipin antibodies are negative,  functional assay for lupus anti-coagulant panel was positive for the presence of a lupus anticoagulant. 7. Serum homocysteine 10.6: Normal 8. ANA negative CBC does reveal mild leukocytosis white count of 11.3 with mild increase in lymphocytes and monocytes. Not significantly elevated to be of concern.  Counseling: I discussed with the patient the mechanism of action of lupus anticoagulant and antiphospholipid antibody syndrome. Plan: Recheck lupus anticoagulant testing in 3 months. Only persistent positive lupus anticoagulant is considered to be significant. If it is positive on repeat testing, she will benefit from lifelong anticoagulation.  Return to clinic in 3 months with labs done ahead of time.

## 2016-08-02 NOTE — Progress Notes (Signed)
Patient Care Team: Latrelle Dodrill, MD as PCP - General (Pediatrics)  DIAGNOSIS:  Encounter Diagnosis  Name Primary?  . Dural venous sinus thrombosis     CHIEF COMPLIANT: Follow-up of blood work for hypercoagulability  INTERVAL HISTORY: Angelica Brown is a 41 year old with above-mentioned history of dural venous sinus thrombosis is currently on blood thinners with Xarelto. She is tolerating it fairly well. Headaches are improving. She had extensive blood work for hypercoagulability and she is here today to discuss the results. She was complaining of plan greenish discharge from the nose and is seeing ENT for this. She is also getting set up for another brain MRI.  REVIEW OF SYSTEMS:   Constitutional: Denies fevers, chills or abnormal weight loss Eyes: Denies blurriness of vision Ears, nose, mouth, throat, and face: Denies mucositis or sore throat Respiratory: Denies cough, dyspnea or wheezes Cardiovascular: Denies palpitation, chest discomfort Gastrointestinal:  Denies nausea, heartburn or change in bowel habits Skin: Denies abnormal skin rashes Lymphatics: Denies new lymphadenopathy or easy bruising Neurological:Denies numbness, tingling or new weaknesses Behavioral/Psych: Mood is stable, no new changes  Extremities: No lower extremity edema All other systems were reviewed with the patient and are negative.  I have reviewed the past medical history, past surgical history, social history and family history with the patient and they are unchanged from previous note.  ALLERGIES:  is allergic to ampicillin; strawberry extract; cephalexin; and prednisone.  MEDICATIONS:  Current Outpatient Prescriptions  Medication Sig Dispense Refill  . levofloxacin (LEVAQUIN) 500 MG tablet Take 1 tablet (500 mg total) by mouth daily. 10 tablet 0  . ondansetron (ZOFRAN) 8 MG tablet Take 1 tablet (8 mg total) by mouth every 8 (eight) hours as needed for nausea or vomiting. 30 tablet 0  .  rivaroxaban (XARELTO) 20 MG TABS tablet Take 1 tablet (20 mg total) by mouth daily with supper. 30 tablet 2  . topiramate (TOPAMAX) 50 MG tablet Take 1 tablet (50 mg total) by mouth 2 (two) times daily. 60 tablet 3   No current facility-administered medications for this visit.     PHYSICAL EXAMINATION: ECOG PERFORMANCE STATUS: 1 - Symptomatic but completely ambulatory  Vitals:   08/02/16 0949  BP: 124/71  Pulse: 71  Resp: 17  Temp: 97.6 F (36.4 C)   Filed Weights   08/02/16 0949  Weight: (!) 350 lb 1.6 oz (158.8 kg)    GENERAL:alert, no distress and comfortable SKIN: skin color, texture, turgor are normal, no rashes or significant lesions EYES: normal, Conjunctiva are pink and non-injected, sclera clear OROPHARYNX:no exudate, no erythema and lips, buccal mucosa, and tongue normal  NECK: supple, thyroid normal size, non-tender, without nodularity LYMPH:  no palpable lymphadenopathy in the cervical, axillary or inguinal LUNGS: clear to auscultation and percussion with normal breathing effort HEART: regular rate & rhythm and no murmurs and no lower extremity edema ABDOMEN:abdomen soft, non-tender and normal bowel sounds MUSCULOSKELETAL:no cyanosis of digits and no clubbing  NEURO: alert & oriented x 3 with fluent speech, no focal motor/sensory deficits EXTREMITIES: No lower extremity edema  LABORATORY DATA:  I have reviewed the data as listed   Chemistry      Component Value Date/Time   NA 136 06/01/2016 0421   K 3.8 06/01/2016 0421   CL 101 06/01/2016 0421   CO2 24 06/01/2016 0421   BUN 7 06/01/2016 0421   CREATININE 0.79 06/01/2016 0421   CREATININE 0.74 12/02/2015 1144      Component Value Date/Time  CALCIUM 9.4 06/01/2016 0421   ALKPHOS 66 12/02/2015 1144   AST 20 12/02/2015 1144   ALT 25 12/02/2015 1144   BILITOT 0.5 12/02/2015 1144       Lab Results  Component Value Date   WBC 11.3 (H) 07/01/2016   HGB 12.7 07/01/2016   HCT 35.8 07/01/2016   MCV  80.1 07/01/2016   PLT 356 07/01/2016   NEUTROABS 6.0 07/01/2016    ASSESSMENT & PLAN:  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 She was initially treated with Lovenox and later switched to oral Xarelto. She has been tolerating it fairly well. Headaches have improved significantly. She was sent to [place] for discussion regarding hypercoagulability workup and to determine the duration of treatment.  Lab work review: 1. Factor V Leiden mutation: Normal 2. Prothrombin gene G20210A: Normal 3. Protein S antigen: 89%; protein S functional 60%, protein S total 89% : Normal 4. Protein C antigen: 94% , protein C functional 134%: Normal 5. Antithrombin deficiency: 103%: Normal 6. Antiphospholipid : Even though Beta 2 glycoprotein 1 antibodies and anticardiolipin antibodies are negative,  functional assay for lupus anti-coagulant panel was positive for the presence of a lupus anticoagulant. 7. Serum homocysteine 10.6: Normal 8. ANA negative CBC does reveal mild leukocytosis white count of 11.3 with mild increase in lymphocytes and monocytes. Not significantly elevated to be of concern.  Counseling: I discussed with the patient the mechanism of action of lupus anticoagulant and antiphospholipid antibody syndrome. Plan: Recheck lupus anticoagulant testing in 3 months. Only persistent positive lupus anticoagulant is considered to be significant. If it is positive on repeat testing, she will benefit from lifelong anticoagulation.  Return to clinic in 3 months with labs done ahead of time.  I spent 25 minutes talking to the patient of which more than half was spent in counseling and coordination of care.  Orders Placed This Encounter  Procedures  . Lupus anticoagulant panel    Standing  Status:   Future    Standing Expiration Date:   09/06/2017   The patient has a good understanding of the overall plan. she agrees with it. she will call with any problems that may develop before the next visit here.   Sabas Sous, MD 08/02/16

## 2016-08-07 ENCOUNTER — Ambulatory Visit (HOSPITAL_COMMUNITY)
Admission: RE | Admit: 2016-08-07 | Discharge: 2016-08-07 | Disposition: A | Discharge status: Home / Self Care | Payer: Commercial Managed Care - HMO | Source: Ambulatory Visit | Attending: Family Medicine | Admitting: Family Medicine

## 2016-08-07 DIAGNOSIS — G08 Intracranial and intraspinal phlebitis and thrombophlebitis: Secondary | ICD-10-CM | POA: Diagnosis present

## 2016-08-07 MED ORDER — GADOBENATE DIMEGLUMINE 529 MG/ML IV SOLN
20.0000 mL | Freq: Once | INTRAVENOUS | Status: AC
  Administered 2016-08-07: 20 mL via INTRAVENOUS

## 2016-08-11 ENCOUNTER — Ambulatory Visit (INDEPENDENT_AMBULATORY_CARE_PROVIDER_SITE_OTHER): Payer: Commercial Managed Care - HMO | Admitting: Family Medicine

## 2016-08-11 VITALS — BP 112/68 | HR 68 | Temp 98.5°F | Wt 348.0 lb

## 2016-08-11 DIAGNOSIS — J329 Chronic sinusitis, unspecified: Secondary | ICD-10-CM

## 2016-08-11 DIAGNOSIS — R51 Headache: Secondary | ICD-10-CM | POA: Diagnosis not present

## 2016-08-11 DIAGNOSIS — R519 Headache, unspecified: Secondary | ICD-10-CM

## 2016-08-11 MED ORDER — KETOROLAC TROMETHAMINE 30 MG/ML IJ SOLN
30.0000 mg | Freq: Once | INTRAMUSCULAR | Status: AC
  Administered 2016-08-11: 30 mg via INTRAMUSCULAR

## 2016-08-11 NOTE — Patient Instructions (Signed)
Please start the levaquin.   Your MRI only showed a small amount of clot left.   Keep your appointment with Dr Jenne Pane.  Take care,  Dr Jimmey Ralph

## 2016-08-11 NOTE — Progress Notes (Signed)
    Subjective:  Angelica Brown is a 41 y.o. female who presents to the [facility] today with a chief complaint of headache.   HPI:  Headache Patient with a history of dural venous thrombosis diagnosed two months ago in the setting of possible mastoiditis. She is currently on xarelto for this. She had an MRI four days ago which showed only small residual clot. Her current symptoms started yesterday with pain along the frontal aspect of her head. She took some tylenol which didn't help. She took oxycodone that made her go to sleep. She then woke up around 230 with pain in her head. Denies weakness, numbness, vision changes, and loss of consciousness.  She has thick green discharge out of her left nostril. She was instructed to start levaquin by her PCP last month for this but she has not started due to being fearful of side effects. She has follow up with ENT in 2 weeks.   ROS: Per HPI  PMH: Smoking history reviewed.   Objective:  Physical Exam: BP 112/68   Pulse 68   Temp 98.5 F (36.9 C) (Oral)   Wt (!) 348 lb (157.9 kg)   LMP 08/05/2016 (Exact Date)   SpO2 98%   BMI 51.39 kg/m   Gen: NAD, resting comfortably HEENT: PERRL. EOMI. MMM. No mastoid tenderness.  CV: RRR with no murmurs appreciated Pulm: NWOB, CTAB with no crackles, wheezes, or rhonchi MSK: no edema, cyanosis, or clubbing noted Skin: warm, dry Neuro: CN2-12 intact. Strength 5/5 in upper and lower extremities. Sensation to light touch intact throughout.  Psych: Normal affect and thought content  Assessment/Plan:  Headache Doubt her headache is related to her dural venous thrombosis given that it has significantly improved on her MRI from 4 days ago. No red flag signs or symptoms today. Symptoms may be due to her chronic sinusitis. Recommended patient to start her course of levaquin as instructed to last month. Gave shot of toradol today. Recommended patient to continue her current dose of topamax Patient has follow up  with ENT in 2 weeks. Return precautions reviewed. If continues to have chronic headaches, would consider referral to headache clinic.   Katina Degree. Jimmey Ralph, MD [facility] Resident PGY-3 08/11/2016 3:07 PM

## 2016-09-01 ENCOUNTER — Encounter: Payer: Self-pay | Admitting: Gynecology

## 2016-10-06 ENCOUNTER — Telehealth: Payer: Self-pay | Admitting: Family Medicine

## 2016-10-06 NOTE — Telephone Encounter (Signed)
Pt called to get an appointment with Dr. Pollie Meyer. The first available is 11/05/16. She needs to have her FMLA paperwork updated to also include intermitted days off. The paperwork has a deadline on 10/23/16. Can the doctor fill this out without her being seen or could the doctor squeeze her in somewhere? If not can she see another provider. She has a SDA for tomorrow 10/07/16 and will dropped off paperwork. Please call to inform patient of if of what will be done. jw

## 2016-10-07 ENCOUNTER — Ambulatory Visit (HOSPITAL_COMMUNITY)
Admission: RE | Admit: 2016-10-07 | Discharge: 2016-10-07 | Disposition: A | Discharge status: Home / Self Care | Payer: 59 | Source: Ambulatory Visit | Attending: Family Medicine | Admitting: Family Medicine

## 2016-10-07 ENCOUNTER — Encounter: Payer: Self-pay | Admitting: Family Medicine

## 2016-10-07 ENCOUNTER — Ambulatory Visit (INDEPENDENT_AMBULATORY_CARE_PROVIDER_SITE_OTHER): Payer: 59 | Admitting: Family Medicine

## 2016-10-07 ENCOUNTER — Encounter (HOSPITAL_COMMUNITY): Payer: Self-pay

## 2016-10-07 VITALS — BP 115/60 | HR 54 | Temp 98.0°F | Ht 69.0 in | Wt 349.2 lb

## 2016-10-07 DIAGNOSIS — R51 Headache: Principal | ICD-10-CM

## 2016-10-07 DIAGNOSIS — R519 Headache, unspecified: Secondary | ICD-10-CM

## 2016-10-07 NOTE — Telephone Encounter (Signed)
Unclear if her specalist or if Dr Pollie Meyer should fill out FMLA.  Regardless Dr Pollie Meyer will return on Monday and can address Patient should NOT come for an SDA appointment just to have the papers filled out

## 2016-10-07 NOTE — Progress Notes (Signed)
Was called by Gala Murdoch from radiological scheduling and was informed that [facility] was not able to perform the MRI because of the weight limits of the machine.  I called the patient to let her know that another appointment had been made for her at [facility] at 0900 on 10/08/2016.Glennie Hawk

## 2016-10-07 NOTE — Patient Instructions (Signed)
We've scheduled a MRI.

## 2016-10-07 NOTE — Progress Notes (Signed)
Subjective: JX:[id number] HPI: Patient is a 41 y.o. female with a past medical history of migraines, Antiphospholipid antibody syndromesinus bradycardia, DVT, dural venous sinus thrombosis, chronic diastolic heart failure, tobacco dependence, sickle cell trait presenting to clinic today for a same day appt.   Started having headaches 2 days ago.  She took a nap on Tuesday evening after work and woke up with the headache. It has been progressively worsening.  Notes pain is diffuse Took tylenol without improvement. Oxycodone caused it to ease off but it came back.  Has nausea and vomiting. No photophobia or sensitivity to sounds. Feels a little off balance, ran into a bush yesterday but thought it was b/c she was tired.  Denies weakness or change in sensation.  No change in vision. intermittently has black spot in vision.  She was up all night. She has a friend with her today but she drove. Doesn't feel like her typical migraines.  Doesn't feel as bad as dural venous sinus thrombosis, but it does feel like when it started.   On topamax and Xarelto without missed doses.  Other issues found by hematology: 1. Factor V Leiden mutation 2. 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 Protein S deficiency  4. Protein C deficiency  5. Antithrombin deficiency  Social History: nonsmoker    ROS: All other systems reviewed and are negative.  Past Medical History Patient Active Problem List   Diagnosis Date Noted  . Nystagmus 06/30/2016  . Chronic diastolic heart failure (HCC) 06/13/2016  . Acute stress reaction 06/09/2016  . DVT of upper extremity (deep vein thrombosis) (HCC) 06/09/2016  . Superficial venous thrombosis of right upper extremity   . Sinus bradycardia   . Mastoiditis of right side 05/29/2016  . 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 Papilledema of both eyes   . Bacterial meningitis   . Headache 05/26/2016  . Onychomycosis 04/16/2013  . Family history of breast cancer  01/17/2013  . Sickle cell trait (HCC)   . Vertigo 08/29/2008  . OBESITY, MORBID 07/10/2008  . SYNCOPE 10/24/2006  . CHOLELITHIASIS 07/13/2006  . TOBACCO DEPENDENCE 06/16/2006  . METRORRHAGIA 06/16/2006    Medications- reviewed and updated Current Outpatient Prescriptions  Medication Sig Dispense Refill  . levofloxacin (LEVAQUIN) 500 MG tablet Take 1 tablet (500 mg total) by mouth daily. 10 tablet 0  . ondansetron (ZOFRAN) 8 MG tablet Take 1 tablet (8 mg total) by mouth every 8 (eight) hours as needed for nausea or vomiting. 30 tablet 0  . rivaroxaban (XARELTO) 20 MG TABS tablet Take 1 tablet (20 mg total) by mouth daily with supper. 30 tablet 2  . topiramate (TOPAMAX) 50 MG tablet Take 1 tablet (50 mg total) by mouth 2 (two) times daily. 60 tablet 3   No current facility-administered medications for this visit.     Objective: Office vital signs reviewed. BP 115/60 (BP Location: Left Wrist, Patient Position: Sitting, Cuff Size: Normal)   Pulse (!) 54   Temp 98 F (36.7 C) (Oral)   Ht 5\' 9"  (1.753 m)   Wt (!) 349 lb 3.2 oz (158.4 kg)   SpO2 95%   BMI 51.57 kg/m    Physical Examination:  General: Awake, alert, appears uncomfortable. ENMT: MMM, Oropharynx clear without erythema or tonsillar exudate/hypertrophy Eyes: Conjunctiva non-injected. PERRL. Normal fundoscopic exam without papilledema per Dr. Deirdre Priest, difficult exam for me. Cardio: RRR, no m/r/g noted.  Pulm: No increased WOB.  CTAB, without wheezes, rhonchi or crackles noted.  MSK: Normal gait and station  Neuro: A&O x4. Speech clear. EOMI, Uvula and tongue midline. Facial movements symmetric. 5/5 strength in the upper extremities and lower extremities bilaterally. Sensation intact bilaterally. Normal DTRs.     Assessment/Plan: Headache Patient presenting with a head that feels like her headaches initially did when she was noted to have dural sinus venous thromboses. No neurologic deficits, but given the patient's  reported symptoms and her hypercoagulable state, I feel that an MRI urgently is required.  Last MRI was from April 2018 which showed improvement in the thrombus. -Patient to go to was a long for an MRI at 1:00 this afternoon -Dr. Deirdre Priest will take the call from radiology concerning results.  -Of arthritis to treat as though she has a migraine.   Orders Placed This Encounter  Procedures  . MR Brain W Wo Contrast    Standing Status:   Future    Standing Expiration Date:   12/07/2017    Order Specific Question:   If indicated for the ordered procedure, I authorize the administration of contrast media per Radiology protocol    Answer:   Yes    Order Specific Question:   Reason for Exam (SYMPTOM  OR DIAGNOSIS REQUIRED)    Answer:   severe headaches, h/o dural venous sinus thrombosis    Order Specific Question:   What is the patient's sedation requirement?    Answer:   No Sedation    Order Specific Question:   Does the patient have a pacemaker or implanted devices?    Answer:   No    Order Specific Question:   Preferred imaging location?    Answer:   [facility] (table limit-500 lbs)    Order Specific Question:   Call Results- Best Contact Number?    Answer:   spk to Dr. Leonides Schanz or Dr. Juanna Cao [phone], no hold    Order Specific Question:   Radiology Contrast Protocol - do NOT remove file path    Answer:   \\[email or web address]  . Basic metabolic panel    No orders of the defined types were placed in this encounter.   Joanna Puff PGY-3, [facility]

## 2016-10-07 NOTE — Assessment & Plan Note (Signed)
Patient presenting with a head that feels like her headaches initially did when she was noted to have dural sinus venous thromboses. No neurologic deficits, but given the patient's reported symptoms and her hypercoagulable state, I feel that an MRI urgently is required.  Last MRI was from April 2018 which showed improvement in the thrombus. -Patient to go to was a long for an MRI at 1:00 this afternoon -Dr. Deirdre Priest will take the call from radiology concerning results.  -Of arthritis to treat as though she has a migraine.

## 2016-10-08 ENCOUNTER — Ambulatory Visit (HOSPITAL_COMMUNITY)
Admission: RE | Admit: 2016-10-08 | Discharge: 2016-10-08 | Disposition: A | Discharge status: Home / Self Care | Payer: 59 | Source: Ambulatory Visit | Attending: Family Medicine | Admitting: Family Medicine

## 2016-10-08 ENCOUNTER — Other Ambulatory Visit: Payer: Self-pay | Admitting: Family Medicine

## 2016-10-08 ENCOUNTER — Encounter (HOSPITAL_COMMUNITY): Payer: Self-pay

## 2016-10-08 DIAGNOSIS — Z86718 Personal history of other venous thrombosis and embolism: Secondary | ICD-10-CM | POA: Insufficient documentation

## 2016-10-08 DIAGNOSIS — D6859 Other primary thrombophilia: Secondary | ICD-10-CM | POA: Insufficient documentation

## 2016-10-08 DIAGNOSIS — R51 Headache: Secondary | ICD-10-CM | POA: Insufficient documentation

## 2016-10-08 DIAGNOSIS — R519 Headache, unspecified: Secondary | ICD-10-CM

## 2016-10-08 LAB — BASIC METABOLIC PANEL
BUN/Creatinine Ratio: 9 (ref 9–23)
BUN: 7 mg/dL (ref 6–20)
CALCIUM: 9.3 mg/dL (ref 8.7–10.2)
CO2: 22 mmol/L (ref 20–29)
CREATININE: 0.74 mg/dL (ref 0.57–1.00)
Chloride: 105 mmol/L (ref 96–106)
GFR calc non Af Amer: 102 mL/min/{1.73_m2} (ref >59)
GFR, EST AFRICAN AMERICAN: 118 mL/min/{1.73_m2} (ref >59)
GLUCOSE: 82 mg/dL (ref 65–99)
Potassium: 4.5 mmol/L (ref 3.5–5.2)
Sodium: 143 mmol/L (ref 134–144)

## 2016-10-08 MED ORDER — GADOBENATE DIMEGLUMINE 529 MG/ML IV SOLN
20.0000 mL | Freq: Once | INTRAVENOUS | Status: AC | PRN
  Administered 2016-10-08: 20 mL via INTRAVENOUS

## 2016-10-12 NOTE — Telephone Encounter (Signed)
Attempted to reach patient to discuss, but no answer. LVM asking her to call back. Latrelle Dodrill, MD

## 2016-10-25 ENCOUNTER — Other Ambulatory Visit: Payer: Commercial Managed Care - HMO

## 2016-10-25 NOTE — Telephone Encounter (Signed)
Patient called back earlier requesting an appointment to be seen. I am booked for the next 4-5 weeks. I scheduled her to see me tomorrow at 11am. She was appreciative.  Latrelle Dodrill, MD

## 2016-10-26 ENCOUNTER — Other Ambulatory Visit (HOSPITAL_BASED_OUTPATIENT_CLINIC_OR_DEPARTMENT_OTHER): Payer: 59

## 2016-10-26 ENCOUNTER — Encounter: Payer: Self-pay | Admitting: Family Medicine

## 2016-10-26 ENCOUNTER — Ambulatory Visit (INDEPENDENT_AMBULATORY_CARE_PROVIDER_SITE_OTHER): Payer: 59 | Admitting: Family Medicine

## 2016-10-26 ENCOUNTER — Other Ambulatory Visit: Payer: 59

## 2016-10-26 VITALS — BP 110/78 | HR 65 | Temp 98.2°F | Ht 69.0 in | Wt 346.8 lb

## 2016-10-26 DIAGNOSIS — M79662 Pain in left lower leg: Secondary | ICD-10-CM

## 2016-10-26 DIAGNOSIS — K529 Noninfective gastroenteritis and colitis, unspecified: Secondary | ICD-10-CM | POA: Diagnosis not present

## 2016-10-26 DIAGNOSIS — G08 Intracranial and intraspinal phlebitis and thrombophlebitis: Secondary | ICD-10-CM

## 2016-10-26 DIAGNOSIS — L219 Seborrheic dermatitis, unspecified: Secondary | ICD-10-CM | POA: Diagnosis not present

## 2016-10-26 MED ORDER — KETOCONAZOLE 2 % EX CREA: 1.0000 "application " | TOPICAL_CREAM | Freq: Two times a day (BID) | CUTANEOUS | 0 refills | Status: DC

## 2016-10-26 MED ORDER — RIVAROXABAN 20 MG PO TABS: 20.0000 mg | ORAL_TABLET | Freq: Every day | ORAL | 2 refills | Status: DC

## 2016-10-26 NOTE — Progress Notes (Signed)
Date of Visit: 10/26/2016   HPI:  Patient presents for follow up. She needed to be seen quickly and I did not have any openings so we scheduled her specially for today. She has several issues to discuss:  Leg pain: Reports having a stabbing pain in her left calf for about 2.5 months. Denies any swelling, redness, or fever. She believes it is related to taking Topamax. She is compliant with xarelto.  Diarrhea: Ever since stopping antibiotics for mastoiditis, she has had diarrhea frequently. Reports every time she eats she has diarrhea/soft stools. No blood in her stool. Eating and drinking normally. The diarrhea is impacting her life greatly, she is having coming to work late or even missing half days of work at a time due to frequent diarrhea.  Headaches: Treated earlier this year for venous sinus thrombosis with Xarelto. Compliant with this medication. Has headaches on occasion, but their overall less than before. Recently underwent MRI and wants to know the result of that test. She takes Topamax, but wants to stop this medication and she perceives that is causing her side effects. She has not seen neurology since this event earlier this year. She would like to be referred to neurology.  Flaking on face: Has recently noticed flaking on her eyebrows and the right cheek of her face. Has tried a variety of home remedies including rubbing alcohol and hydrocortisone cream, without relief.  FMLA papers: Needs updated FMLA paperwork done for her job. Needs this to say that she has a doctor appointment once every 2 weeks lasting a full day. And she also may miss a half day of work 3 times a week.  ROS: See HPI.  PMFSH: History of dural sinus thrombosis, mastoiditis the right side, obesity, DVT  PHYSICAL EXAM: Ht 5\' 9"  (1.753 m)   Wt (!) 346 lb 12.8 oz (157.3 kg)   LMP 10/24/2016   BMI 51.21 kg/m  Gen: no acute distress, pleasant, cooperative HEENT: normocephalic, atraumatic, moist mucous  membranes. No tenderness to palpation of bilateral mastoids. Tympanic membranes clear bilaterally Heart: regular rate and rhythm, no murmur Lungs: clear to auscultation bilaterally, normal work of breathing  Neuro: alert, grossly nonfocal, speech normal, gait normal Ext: No appreciable lower extremity edema bilaterally. No calf redness or swelling on left side. Mild tenderness to palpation of left calf. No palpable cords.  ASSESSMENT/PLAN:  Dural venous sinus thrombosis Stable recent MRI. Has upcoming follow-up scheduled with hematology to evaluate for hypercoagulable state. Continue Xarelto, refill given today. I will refer her to neurology per her request. We elected not to make any changes to her Topamax regimen today, will defer that to neurology.  Chronic diarrhea In setting of recent prolonged antibiotic use, most concerning for C. difficile. She is not able to give [place] a stool sample in clinic here today, but will bring one back. Will obtain GI pathogen panel to evaluate.  Seborrheic dermatitis History and exam consistent with seborrheic dermatitis. We'll try ketoconazole cream. Also may use over-the-counter hydrocortisone.  Leg pain: Overall benign exam, but given tenderness over left calf/behind left knee as I want to be sure she has not clotted, since we have suspicion of her having a hypercoagulable state. We would certainly want to know if she has clotted through Xarelto. Lower extremity Doppler ordered and scheduled for tomorrow morning.  FMLA papers: I will update these. She did not bring them with her today, but will drop them off and I will update.  FOLLOW UP: Follow up in  6 weeks with me for above issues Keep follow up with hematology Referring to neurology  [name], MD [facility]

## 2016-10-26 NOTE — Patient Instructions (Addendum)
It was great to see you again today.  Bring your FMLA papers to [place] and I will complete them.  Getting ultrasound of your left calf to check for a blood clot.  Referring to neurologist.  Bring [place] a stool sample so we can check for infections.   For your face sent in topical ketoconazole cream - twice daily for up to 4 weeks until things get better. Also can use topical over the counter hydrocortisone cream.  Refilled xarelto.  Be well, Dr. Pollie Meyer

## 2016-10-27 ENCOUNTER — Other Ambulatory Visit: Payer: Self-pay

## 2016-10-27 ENCOUNTER — Other Ambulatory Visit (HOSPITAL_BASED_OUTPATIENT_CLINIC_OR_DEPARTMENT_OTHER): Payer: 59

## 2016-10-27 ENCOUNTER — Encounter (HOSPITAL_COMMUNITY): Payer: Self-pay | Admitting: Family Medicine

## 2016-10-27 ENCOUNTER — Ambulatory Visit (HOSPITAL_COMMUNITY)
Admission: RE | Admit: 2016-10-27 | Discharge: 2016-10-27 | Disposition: A | Discharge status: Home / Self Care | Payer: 59 | Source: Ambulatory Visit | Attending: Family Medicine | Admitting: Family Medicine

## 2016-10-27 DIAGNOSIS — G08 Intracranial and intraspinal phlebitis and thrombophlebitis: Secondary | ICD-10-CM

## 2016-10-27 DIAGNOSIS — M79662 Pain in left lower leg: Secondary | ICD-10-CM | POA: Insufficient documentation

## 2016-10-27 DIAGNOSIS — L219 Seborrheic dermatitis, unspecified: Secondary | ICD-10-CM | POA: Insufficient documentation

## 2016-10-27 DIAGNOSIS — K529 Noninfective gastroenteritis and colitis, unspecified: Secondary | ICD-10-CM | POA: Insufficient documentation

## 2016-10-27 NOTE — Progress Notes (Signed)
Labs entered.

## 2016-10-27 NOTE — Assessment & Plan Note (Signed)
History and exam consistent with seborrheic dermatitis. We'll try ketoconazole cream. Also may use over-the-counter hydrocortisone.

## 2016-10-27 NOTE — Progress Notes (Signed)
VASCULAR LAB PRELIMINARY  PRELIMINARY  PRELIMINARY  PRELIMINARY  Left lower extremity venous duplex completed.    Preliminary report:  Left:  No evidence of DVT, superficial thrombosis, or Baker's cyst.  Nolan Lasser, RVS 10/27/2016, 11:15 AM

## 2016-10-27 NOTE — Assessment & Plan Note (Signed)
In setting of recent prolonged antibiotic use, most concerning for C. difficile. She is not able to give [place] a stool sample in clinic here today, but will bring one back. Will obtain GI pathogen panel to evaluate.

## 2016-10-27 NOTE — Assessment & Plan Note (Signed)
Stable recent MRI. Has upcoming follow-up scheduled with hematology to evaluate for hypercoagulable state. Continue Xarelto, refill given today. I will refer her to neurology per her request. We elected not to make any changes to her Topamax regimen today, will defer that to neurology.

## 2016-10-28 ENCOUNTER — Telehealth: Payer: Self-pay | Admitting: Family Medicine

## 2016-10-28 NOTE — Telephone Encounter (Signed)
FMLA forms placed in PCP box.

## 2016-10-28 NOTE — Telephone Encounter (Signed)
fmla form dropped off for at front desk for completion.  Verified that patient section of form has been completed.  Last DOSCC with PCP was 10/26/16.  Placed form in red team folder to be completed by clinical staff.  Lina Sar. Form dropped off when system down.

## 2016-10-29 LAB — GI PROFILE, STOOL, PCR
ASTROVIRUS: NOT DETECTED
Adenovirus F 40/41: NOT DETECTED
C DIFFICILE TOXIN A/B: DETECTED — AB
Campylobacter: DETECTED — AB
Cryptosporidium: NOT DETECTED
Cyclospora cayetanensis: NOT DETECTED
ENTEROAGGREGATIVE E COLI: NOT DETECTED
ENTEROPATHOGENIC E COLI: NOT DETECTED
Entamoeba histolytica: NOT DETECTED
Enterotoxigenic E coli: NOT DETECTED
GIARDIA LAMBLIA: NOT DETECTED
NOROVIRUS GI/GII: NOT DETECTED
Plesiomonas shigelloides: NOT DETECTED
ROTAVIRUS A: NOT DETECTED
SHIGELLA/ENTEROINVASIVE E COLI: NOT DETECTED
Salmonella: NOT DETECTED
Sapovirus: NOT DETECTED
Shiga-toxin-producing E coli: NOT DETECTED
Vibrio cholerae: NOT DETECTED
Vibrio: NOT DETECTED
YERSINIA ENTEROCOLITICA: NOT DETECTED

## 2016-10-29 LAB — LUPUS ANTICOAGULANT PANEL
DRVVT: 86.5 s — AB (ref 0.0–47.0)
PTT-LA: 40.8 s (ref 0.0–51.9)
dRVVT Confirm: 1.6 ratio — ABNORMAL HIGH (ref 0.8–1.2)
dRVVT Mix: 68.5 s — ABNORMAL HIGH (ref 0.0–47.0)

## 2016-10-29 MED ORDER — VANCOMYCIN HCL 125 MG PO CAPS: 125.0000 mg | ORAL_CAPSULE | Freq: Four times a day (QID) | ORAL | 0 refills | Status: DC

## 2016-10-29 NOTE — Telephone Encounter (Signed)
Noted, I will complete forms.  I attempted to reach patient just now via phone to discuss her stool study results. She is positive for both campylobacter and C diff. Discussed results with Dr. Cliffton Asters of ID via phone, who recommends treating just for c diff with oral vancomycin 125mg  four times a day for 2 weeks.  Patient did not answer, I left a VM asking her to call back ASAP. Please page me when she returns the call.  Latrelle Dodrill, MD

## 2016-10-29 NOTE — Telephone Encounter (Signed)
Patient called back and I spoke with her. Advised of need for vancomycin four times a day  For 2 weeks. She was appreciative. rx sent in to her pharmacy  Will complete FMLA forms soon and we will call her when they are ready.  Latrelle Dodrill, MD

## 2016-10-30 NOTE — Telephone Encounter (Signed)
Called patient today to check and make sure she was able to get antibiotics. She is headed to the pharmacy now to get them. Also I made sure she is not taking any anti-diarrheal medications, and she confirmed she is not taking these. Advised not to take any with dx of c diff. Patient appreciative. She will call the clinic today and go through the operator to reach our on-call resident if she is not able to get her oral vancomycin. I am also attending in the hospital this weekend and will be able to speak with her if needed.   Latrelle Dodrill, MD

## 2016-11-01 ENCOUNTER — Encounter: Payer: Self-pay | Admitting: Hematology and Oncology

## 2016-11-01 ENCOUNTER — Ambulatory Visit (HOSPITAL_BASED_OUTPATIENT_CLINIC_OR_DEPARTMENT_OTHER): Payer: 59 | Admitting: Hematology and Oncology

## 2016-11-01 DIAGNOSIS — D6861 Antiphospholipid syndrome: Secondary | ICD-10-CM

## 2016-11-01 DIAGNOSIS — Z7901 Long term (current) use of anticoagulants: Secondary | ICD-10-CM

## 2016-11-01 DIAGNOSIS — G08 Intracranial and intraspinal phlebitis and thrombophlebitis: Secondary | ICD-10-CM | POA: Diagnosis not present

## 2016-11-01 NOTE — Progress Notes (Signed)
Patient Care Team: Latrelle Dodrill, MD as PCP - General (Pediatrics)  DIAGNOSIS:  Encounter Diagnoses  Name Primary?  . Dural venous sinus thrombosis   . Antiphospholipid antibody syndrome (HCC)    CHIEF COMPLIANT: Follow-up to discuss the results of recently performed blood work  INTERVAL HISTORY: Angelica Brown is a 41 year old with above-mentioned history of dural venous sinus thrombosis was found to have positive lupus anticoagulant testing. The specific antibodies were negative but the functional assay did come back positive. She underwent repeat testing a week ago and is here today to discuss the results. She is currently on full anticoagulation with Xarelto appears to be tolerating it very well.  REVIEW OF SYSTEMS:   Constitutional: Denies fevers, chills or abnormal weight loss Eyes: Denies blurriness of vision Ears, nose, mouth, throat, and face: Denies mucositis or sore throat Respiratory: Denies cough, dyspnea or wheezes Cardiovascular: Denies palpitation, chest discomfort Gastrointestinal:  Diarrhea Skin: Denies abnormal skin rashes Lymphatics: Denies new lymphadenopathy or easy bruising Neurological:Denies numbness, tingling or new weaknesses Behavioral/Psych: Mood is stable, no new changes  Extremities: No lower extremity edema All other systems were reviewed with the patient and are negative.  I have reviewed the past medical history, past surgical history, social history and family history with the patient and they are unchanged from previous note.  ALLERGIES:  is allergic to ampicillin; strawberry extract; cephalexin; and prednisone.  MEDICATIONS:  Current Outpatient Prescriptions  Medication Sig Dispense Refill  . ketoconazole (NIZORAL) 2 % cream Apply 1 application topically 2 (two) times daily. 60 g 0  . rivaroxaban (XARELTO) 20 MG TABS tablet Take 1 tablet (20 mg total) by mouth daily with supper. 30 tablet 2  . topiramate (TOPAMAX) 50 MG tablet  Take 1 tablet (50 mg total) by mouth 2 (two) times daily. 60 tablet 3  . vancomycin (VANCOCIN) 125 MG capsule Take 1 capsule (125 mg total) by mouth 4 (four) times daily. For 2 weeks 56 capsule 0   No current facility-administered medications for this visit.     PHYSICAL EXAMINATION: ECOG PERFORMANCE STATUS: 1 - Symptomatic but completely ambulatory  Vitals:   11/01/16 1028  BP: 133/81  Pulse: 66  Resp: 18  Temp: 97.6 F (36.4 C)   Filed Weights   11/01/16 1028  Weight: (!) 350 lb (158.8 kg)    GENERAL:alert, no distress and comfortable SKIN: skin color, texture, turgor are normal, no rashes or significant lesions EYES: normal, Conjunctiva are pink and non-injected, sclera clear OROPHARYNX:no exudate, no erythema and lips, buccal mucosa, and tongue normal  NECK: supple, thyroid normal size, non-tender, without nodularity LYMPH:  no palpable lymphadenopathy in the cervical, axillary or inguinal LUNGS: clear to auscultation and percussion with normal breathing effort HEART: regular rate & rhythm and no murmurs and no lower extremity edema ABDOMEN:abdomen soft, non-tender and normal bowel sounds MUSCULOSKELETAL:no cyanosis of digits and no clubbing  NEURO: alert & oriented x 3 with fluent speech, no focal motor/sensory deficits EXTREMITIES: No lower extremity edema  LABORATORY DATA:  I have reviewed the data as listed   Chemistry      Component Value Date/Time   NA 143 10/07/2016 1409   K 4.5 10/07/2016 1409   CL 105 10/07/2016 1409   CO2 22 10/07/2016 1409   BUN 7 10/07/2016 1409   CREATININE 0.74 10/07/2016 1409   CREATININE 0.74 12/02/2015 1144      Component Value Date/Time   CALCIUM 9.3 10/07/2016 1409   ALKPHOS 66 12/02/2015 1144  AST 20 12/02/2015 1144   ALT 25 12/02/2015 1144   BILITOT 0.5 12/02/2015 1144       Lab Results  Component Value Date   WBC 11.3 (H) 07/01/2016   HGB 12.7 07/01/2016   HCT 35.8 07/01/2016   MCV 80.1 07/01/2016   PLT 356  07/01/2016   NEUTROABS 6.0 07/01/2016    ASSESSMENT & PLAN:  Dural venous sinus thrombosis Sudden onset of headaches treated initially for migraines but when she was found to have profoundly enlarged retinal veins, she underwent a CT of the head which revealed dural venous sinus thrombosis. She was initially treated with Lovenox and later switched to oral Xarelto. She has been tolerating it fairly well.   Workup was negative for hypercoagulability except for lupus anticoagulant panel which was once again positive on repeat testing confirming antiphospholipid antibody syndrome Diarrhea due to C. difficile: Currently on oral vancomycin  Treatment recommendation: Anticoagulation for life Patient's primary care physician has been refilling her Xarelto. I discussed with the patient that the only reason to repeat testing for the lupus anti-quite lengthy is if she were tired of taking the medication and wants to know whether the antibody was still positive. However I discussed with her that we cannot trust 1 negative result and certainly we will have to repeat and show repeat negativity before discontinuing her treatment.  The recommended guidelines is anticoagulation for life.  Return to clinic on an as-needed basis.  I spent 15 minutes talking to the patient of which more than half was spent in counseling and coordination of care.  No orders of the defined types were placed in this encounter.  The patient has a good understanding of the overall plan. she agrees with it. she will call with any problems that may develop before the next visit here.   Sabas Sous, MD 11/01/16

## 2016-11-01 NOTE — Assessment & Plan Note (Signed)
Sudden onset of headaches treated initially for migraines but when she was found to have profoundly enlarged retinal veins, she underwent a CT of the head which revealed dural venous sinus thrombosis. She was initially treated with Lovenox and later switched to oral Xarelto. She has been tolerating it fairly well.   Workup was negative for hypercoagulability except for lupus anticoagulant panel which was once again positive on repeat testing confirming antiphospholipid antibody syndrome  Treatment recommendation: Anticoagulation for life  Return to clinic in 1 year for follow-up

## 2016-11-02 NOTE — Telephone Encounter (Signed)
FMLA papers completed. Will deposit in RN office per protocol. Should be faxed to number listed on form, also scanned into chart, and a copy made for patient to pick up.  Prior to faxing, please let patient know that I did have to write on the paperwork that she has had diarrhea (she was hesitant about this at her office visit). Make sure she is ok with this being shared before faxing it.  Thanks Latrelle Dodrill, MD

## 2016-11-03 NOTE — Telephone Encounter (Signed)
Patient informed that FMLA forms were complete.  Patient stated it was ok that provider put she had diarrhea and to go ahead and fax forms [phone]. Forms copied for scanning in patient's record. Original copy placed up front for pickup.  Clovis Pu, RN

## 2016-11-19 ENCOUNTER — Telehealth: Payer: Self-pay

## 2016-11-19 ENCOUNTER — Ambulatory Visit: Payer: Commercial Managed Care - HMO | Admitting: Neurology
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
## 2016-11-30 ENCOUNTER — Telehealth: Payer: Self-pay | Admitting: Family Medicine

## 2016-11-30 NOTE — Telephone Encounter (Signed)
Angelica Brown - I can't be double booked that morning as I have a student working with me then - it will be near impossible to see 11 patients that morning.  Alternatively, we could try to fit her in another time that week. I could see her any of the following times: - Wednesday 8/22 from 10:45-11:30am - Thursday 8/23 any time in the afternoon.  Can you reschedule her to a time in one of these blocks?  FYI, I will be out the rest of the week.  Thanks, [place]

## 2016-11-30 NOTE — Telephone Encounter (Signed)
Pt called because she said you called her and told her to come in for a office on 12/10/16. You are fully booked so I double booked you for 9:30 am. She said that it was important for her to come in because you needed to see her ASAP. I tried your office but you were not in. I told the patient that I would still ask you if that was okay and if not I would call her back to reschedule. jw

## 2016-12-08 ENCOUNTER — Encounter: Payer: Self-pay | Admitting: Family Medicine

## 2016-12-08 ENCOUNTER — Ambulatory Visit (INDEPENDENT_AMBULATORY_CARE_PROVIDER_SITE_OTHER): Payer: 59 | Admitting: Family Medicine

## 2016-12-08 VITALS — BP 120/78 | HR 74 | Temp 97.5°F | Ht 69.0 in | Wt 348.0 lb

## 2016-12-08 DIAGNOSIS — D6861 Antiphospholipid syndrome: Secondary | ICD-10-CM | POA: Diagnosis not present

## 2016-12-08 DIAGNOSIS — R197 Diarrhea, unspecified: Secondary | ICD-10-CM | POA: Diagnosis not present

## 2016-12-08 DIAGNOSIS — L219 Seborrheic dermatitis, unspecified: Secondary | ICD-10-CM

## 2016-12-08 DIAGNOSIS — Z23 Encounter for immunization: Secondary | ICD-10-CM | POA: Diagnosis not present

## 2016-12-08 DIAGNOSIS — K529 Noninfective gastroenteritis and colitis, unspecified: Secondary | ICD-10-CM | POA: Diagnosis not present

## 2016-12-08 DIAGNOSIS — Z7901 Long term (current) use of anticoagulants: Secondary | ICD-10-CM | POA: Diagnosis not present

## 2016-12-08 NOTE — Progress Notes (Signed)
Date of Visit: 12/08/2016   HPI:  Patient presents for follow up.   Diarrhea - after last visit was diagnosed with C. difficile infection. Completed a course of oral vancomycin to treat this. Since then her diarrhea has backed off quite a bit. She is now having only 2-3 times a day. Stools are more formed, but are still slightly soft or loose. She is worried she has reacquired C. difficile and wants to give another stool sample.  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 She continues to take Topamax. She did see hematology who confirmed diagnosis of antiphospholipid antibody syndrome and advised she will need lifelong anticoagulation. She is understandably bummed to hear this. Otherwise coping well.  Tooth extraction-has plans for upcoming single tooth extraction by her dentist. Because she is on blood thinners she needs me to fax a letter to her dentist saying that she can have her tooth removed.  Seborrheic dermatitis-last visit we prescribed ketoconazole cream. Has been using this as needed on her face, with good relief of flaking and itching.  ROS: See HPI.  PMFSH: Antiphospholipid antibody syndrome, dural sinus thrombosis, C. difficile infection, seborrheic dermatitis, morbid obesity  PHYSICAL EXAM: BP 120/78   Pulse 74   Temp (!) 97.5 F (36.4 C) (Oral)   Ht 5\' 9"  (1.753 m)   Wt (!) 348 lb (157.9 kg)   SpO2 95%   BMI 51.39 kg/m  Gen: no acute distress, pleasant, cooperative, well appearing HEENT: normocephalic, atraumatic, moist mucous membranes  Heart: regular rate and rhythm, no murmur Lungs: clear to auscultation bilaterally normal work of breathing  Neuro: alert, grossly nonfocal, speech normal Ext: No appreciable lower extremity edema bilaterally  Abdomen: soft, nontender to palpation, no masses, obese  ASSESSMENT/PLAN:  Health maintenance:  -Tdap given today -recommended flu shot, patient wants to wait on this  Chronic  diarrhea Abdominal exam benign, patient well appaering. Persistent diarrhea after being treated with oral vancomycin for C. difficile. We'll obtain a repeat stool sample in order to test for persistent C. Difficile. Will check EIA for toxins a and B, as NAAT test might still be positive. Orders entered, patient will bring a sample when she is able.  Antiphospholipid antibody syndrome (HCC) Confirmed antiphospholipid antibody syndrome with lupus anticoagulant positive twice. Now requiring lifelong anticoagulation. I will continue to prescribe her xarelto.   She is at high risk of recurrent thrombosis if she were to stop anticoagulation. She is having an upcoming dental extraction, which should be low risk for bleeding. No interruption in anticoagulation necessary. Letter written & will fax to dentist (Dr. Pincus Badder, fax #[phone]) per patient's request.  Seborrheic dermatitis Doing well, continue ketoconazole when necessary.  FOLLOW UP: Follow up in 3 mos for chronic medical issues, sooner if needed  [name], MD [facility]

## 2016-12-08 NOTE — Patient Instructions (Addendum)
Rechecking stool - bring the sample back. I will call you with results.  Continue antifungal cream as needed on your face.  Tetanus shot today.  Note written so that you can get your tooth extracted.  Follow up with me in 3 months, sooner if diarrhea doesn't continue to improve.  Latrelle Dodrill, MD

## 2016-12-09 ENCOUNTER — Encounter: Payer: Self-pay | Admitting: *Deleted

## 2016-12-09 NOTE — Assessment & Plan Note (Signed)
Doing well, continue ketoconazole when necessary.

## 2016-12-09 NOTE — Assessment & Plan Note (Signed)
Abdominal exam benign, patient well appaering. Persistent diarrhea after being treated with oral vancomycin for C. difficile. We'll obtain a repeat stool sample in order to test for persistent C. Difficile. Will check EIA for toxins a and B, as NAAT test might still be positive. Orders entered, patient will bring a sample when she is able.

## 2016-12-09 NOTE — Assessment & Plan Note (Addendum)
Confirmed antiphospholipid antibody syndrome with lupus anticoagulant positive twice. Now requiring lifelong anticoagulation. I will continue to prescribe her xarelto.   She is at high risk of recurrent thrombosis if she were to stop anticoagulation. She is having an upcoming dental extraction, which should be low risk for bleeding. No interruption in anticoagulation necessary. Letter written & will fax to dentist (Dr. Pincus Badder, fax #[phone]) per patient's request.

## 2016-12-10 ENCOUNTER — Ambulatory Visit: Payer: 59 | Admitting: Family Medicine

## 2016-12-11 LAB — CLOSTRIDIUM DIFFICILE EIA: C difficile Toxins A+B, EIA: NEGATIVE

## 2016-12-17 ENCOUNTER — Encounter: Payer: Self-pay | Admitting: Family Medicine

## 2017-01-06 ENCOUNTER — Ambulatory Visit: Payer: Commercial Managed Care - HMO | Admitting: Neurology

## 2017-01-06 ENCOUNTER — Telehealth: Payer: Self-pay

## 2017-01-06 NOTE — Telephone Encounter (Signed)
PATIENT HAD A  NO SHOW ON 11/19/2016, AND 01/06/2017. THIS IS FOR NEW PT APPT.

## 2017-01-11 ENCOUNTER — Encounter: Payer: Self-pay | Admitting: Neurology

## 2017-02-03 ENCOUNTER — Other Ambulatory Visit: Payer: Self-pay | Admitting: Family Medicine

## 2017-02-03 MED ORDER — KETOCONAZOLE 2 % EX CREA: 1.0000 "application " | TOPICAL_CREAM | Freq: Two times a day (BID) | CUTANEOUS | 0 refills | Status: DC

## 2017-03-04 ENCOUNTER — Telehealth: Payer: Self-pay | Admitting: Family Medicine

## 2017-03-04 DIAGNOSIS — G08 Intracranial and intraspinal phlebitis and thrombophlebitis: Secondary | ICD-10-CM

## 2017-03-04 NOTE — Telephone Encounter (Signed)
Pt called and said she as told yesterday at work she would be having to move to [place] on Nov 27. She was looking to set up an appt with her PCP before then, but there are no available appts. Pt was supposed to schedule 3 month fu appt. Pt also wants to know if she needs to get another MRI before she moves and also would like to know if her medications could be refilled every 3 months instead of every month. Also, the patient was asking if her PCP had any recommendations for doctors to see in [place]. Pt just has a lot of questions and is stressed with the moving being thrown on her at such late notice. Please contact this pt as soon as you can.

## 2017-03-04 NOTE — Telephone Encounter (Signed)
Attempted x2 to call patient. Both times it sounded like someone answered the phone and then immediately hung up. I will try to call her back again on Monday.  Latrelle Dodrill, MD

## 2017-03-07 NOTE — Telephone Encounter (Signed)
Called patient & spoke with her. She is moving to [place], [place]. She's driving there, leaving on December 3. 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 He wondered if she needs another MRI before leaving town due to the persistent optic nerve swelling.  Patient reports she otherwise feels well  Reasonable to get another MRI. I will put in order. I am going to see her at 11am on 11/28, outside of my regular clinic time. I have her scheduled.  Red team, can you schedule MRI/MRV?  Thanks, Latrelle Dodrill, MD

## 2017-03-08 NOTE — Telephone Encounter (Signed)
Update - I called this morning and spoke with radiologist Dr. Margo Aye, who read patient's last MRI, in order to clarify what order is most appropriate for this study. He recommends MRI without and with contrast, and then an MRV without contrast.  Orders entered. Red team, can you schedule these studies?  Thanks! Latrelle Dodrill, MD

## 2017-03-16 ENCOUNTER — Telehealth: Payer: Self-pay | Admitting: Family Medicine

## 2017-03-16 ENCOUNTER — Other Ambulatory Visit: Payer: Self-pay | Admitting: Family Medicine

## 2017-03-16 ENCOUNTER — Ambulatory Visit: Payer: 59 | Admitting: Family Medicine

## 2017-03-16 DIAGNOSIS — G08 Intracranial and intraspinal phlebitis and thrombophlebitis: Secondary | ICD-10-CM

## 2017-03-16 NOTE — Telephone Encounter (Signed)
Pt was informed to call [facility] daily to see if they would have any cancellations. Number given to pt. Deseree Bruna Potter, CMA

## 2017-03-16 NOTE — Telephone Encounter (Signed)
As of now patient has not shown up for her appointment which was scheduled at 11am. I attempted to reach her to see if she is planning to come. No answer. Did not leave VM Latrelle Dodrill, MD

## 2017-03-23 ENCOUNTER — Other Ambulatory Visit: Payer: Self-pay | Admitting: Family Medicine

## 2017-03-23 NOTE — Telephone Encounter (Addendum)
Patient called for refills and I spoke with her. She is moving today. They were unable to get the MRI scheduled before her leaving, as the MRI dept was completely booked. She evidently got confused about her appointment last week and thought she did not need to come. She requests a 4 month refill on topamax and xarelto, since she won't be able to miss work within the first 90 days of her new position. Sent in 120 day supply. She will get set up with a new PCP out in [place]. I offered to speak with her new doc on the phone if they have any questions or if I can be helpful in any way. Patient very appreciative.  Latrelle Dodrill, MD

## 2017-03-25 ENCOUNTER — Ambulatory Visit (HOSPITAL_COMMUNITY): Payer: 59

## 2017-03-25 ENCOUNTER — Other Ambulatory Visit (HOSPITAL_COMMUNITY): Payer: 59

## 2017-04-28 ENCOUNTER — Telehealth: Payer: Self-pay | Admitting: *Deleted

## 2017-04-28 NOTE — Telephone Encounter (Signed)
Return green card from the post office

## 2017-08-30 ENCOUNTER — Telehealth: Payer: Self-pay | Admitting: Family Medicine
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
## 2017-09-20 ENCOUNTER — Telehealth: Payer: Self-pay | Admitting: Family Medicine

## 2017-09-20 NOTE — Telephone Encounter (Signed)
Pt called and wanted to let Dr Pollie Meyer know she faxed paper work today 09/20/17 that needs to be filled out but would like for Dr Pollie Meyer to give her a call asap because she needs to explain something in the paper work.

## 2017-09-22 NOTE — Telephone Encounter (Signed)
Covering for Dr. Pollie Meyer.  Do not see paperwork.  Will defer to PCP.

## 2017-09-23 ENCOUNTER — Ambulatory Visit: Payer: 59 | Admitting: Family Medicine

## 2017-09-26 NOTE — Telephone Encounter (Signed)
Called patient to discuss. I also do not see any paperwork in my inbox. No answer, LVM asking patient to call back.  Angelica Dodrill, MD

## 2017-10-11 ENCOUNTER — Encounter: Payer: Self-pay | Admitting: Family Medicine

## 2017-10-11 ENCOUNTER — Encounter: Payer: Self-pay | Admitting: Psychology

## 2017-10-11 ENCOUNTER — Ambulatory Visit (INDEPENDENT_AMBULATORY_CARE_PROVIDER_SITE_OTHER): Payer: Self-pay | Admitting: Family Medicine

## 2017-10-11 ENCOUNTER — Other Ambulatory Visit: Payer: Self-pay

## 2017-10-11 DIAGNOSIS — D6861 Antiphospholipid syndrome: Secondary | ICD-10-CM

## 2017-10-11 DIAGNOSIS — L219 Seborrheic dermatitis, unspecified: Secondary | ICD-10-CM

## 2017-10-11 DIAGNOSIS — F430 Acute stress reaction: Secondary | ICD-10-CM

## 2017-10-11 MED ORDER — RIVAROXABAN 20 MG PO TABS: ORAL_TABLET | ORAL | 1 refills | Status: DC
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
## 2017-10-11 NOTE — Patient Instructions (Addendum)
Refilled xarelto Putting in referral to dermatology and rheumatology. Come see me in early August when you get back in town.  If you have any thoughts of hurting yourself or anyone else, go to the Emergency Room to stay safe.   Be well, Dr. Pollie Meyer
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
## 2017-10-17 NOTE — Assessment & Plan Note (Signed)
Refill xarelto. Referring to rheumatologist for evaluation of possible lupus. Will also await records from [place].

## 2017-10-17 NOTE — Assessment & Plan Note (Signed)
Uncontrolled despite typical treatments of antifungal, steroid, emollient. Will refer to dermatology for evaluation.

## 2017-10-19 ENCOUNTER — Encounter: Payer: Self-pay | Admitting: Family Medicine

## 2017-10-19 ENCOUNTER — Telehealth: Payer: Self-pay | Admitting: Family Medicine

## 2017-10-19 NOTE — Telephone Encounter (Signed)
Called patient to let her know I have her FMLA paperwork completed and a letter to her employer written per her request. No answer, LVM asking her to call back so I can speak with her.  Latrelle Dodrill, MD

## 2017-10-21 ENCOUNTER — Telehealth: Payer: Self-pay | Admitting: Family Medicine

## 2017-10-21 NOTE — Telephone Encounter (Signed)
Reasonable accomodation form/letter dropped off for at front desk for completion and needs letter written also.  Verified that patient section of form has been completed.  Last DOS/WCC with PCP was 10/11/17.  Placed form in team folder to be completed by clinical staff.  Chari Manning

## 2017-10-21 NOTE — Telephone Encounter (Signed)
Placed MDs box for completion. Angelica Brown, CMA

## 2017-10-27 NOTE — Telephone Encounter (Signed)
Reviewed request, which is a letter to explain why patient needs "reasonable accommodations" of moving back to [place]. I wrote a letter and will have it faxed to the requested number [phone]) and also mail a copy to patient by her request.  Will scan in request written by patient and also letter from her employer requesting this documentation.  Latrelle Dodrill, MD

## 2017-10-28 ENCOUNTER — Telehealth: Payer: Self-pay | Admitting: Family Medicine

## 2017-10-28 ENCOUNTER — Encounter: Payer: Self-pay | Admitting: Family Medicine

## 2017-10-28 NOTE — Telephone Encounter (Signed)
Patient came in and asked for a copy of the letter faxed to [place]. It was copied and given to her.  Glennie Hawk, CMA

## 2017-10-28 NOTE — Telephone Encounter (Signed)
Patient wanted to let you know that her medical records have been faxed from [facility] to you, and are available for your review.

## 2017-10-28 NOTE — Telephone Encounter (Signed)
Returned call to patient to let her know all FMLA paperwork has been faxed & handled, and reasonable accommodation letter should have been faxed this AM.  She also needs a letter saying she can go back to work on Monday. Letter written, will place at front desk.  Patient appreciative. Latrelle Dodrill, MD

## 2017-10-28 NOTE — Telephone Encounter (Signed)
Pt is needing her FMLA paperwork completed and returned to her employer. Her letter has been faxed and mailed to her employer.  She would like to talk to Dr Pollie Meyer today/. She is leaving for [place] today.  [phone]

## 2017-10-31 ENCOUNTER — Telehealth: Payer: Self-pay | Admitting: Psychology

## 2017-10-31 NOTE — Assessment & Plan Note (Deleted)
Sudden onset of headaches treated initially for migraines but when she was found to have profoundly enlarged retinal veins, she underwent a CT of the head which revealed dural venous sinus thrombosis. She was initially treated with Lovenox and later switched to oral Xarelto. She has been tolerating it fairly well.   Workup was negative for hypercoagulability except for lupus anticoagulant panel which was once again positive on repeat testing confirming antiphospholipid antibody syndrome Diarrhea due to C. difficile: Currently on oral vancomycin  Treatment recommendation: Anticoagulation for life Patient's primary care physician has been refilling her Xarelto.  RTC in 1 year

## 2017-10-31 NOTE — Telephone Encounter (Signed)
Called patient to see if she and her daughter were able to get connected to the resources I provided during a recent integrated care visit.  Left a brief VM asking her to call me back.

## 2017-11-01 ENCOUNTER — Inpatient Hospital Stay: Payer: Self-pay | Attending: Hematology and Oncology | Admitting: Hematology and Oncology

## 2017-11-03 ENCOUNTER — Telehealth: Payer: Self-pay | Admitting: Family Medicine

## 2017-11-03 NOTE — Telephone Encounter (Signed)
LVM to schedule f/u appt. Please assist in doing so °

## 2017-11-09 DIAGNOSIS — I213 ST elevation (STEMI) myocardial infarction of unspecified site: Secondary | ICD-10-CM

## 2017-11-09 HISTORY — DX: ST elevation (STEMI) myocardial infarction of unspecified site: I21.3

## 2017-11-10 ENCOUNTER — Telehealth: Payer: Self-pay | Admitting: Family Medicine

## 2017-11-10 MED ORDER — HEPARIN SODIUM (PORCINE) 1000 UNIT/ML IJ SOLN
30.00 | INTRAMUSCULAR | Status: DC
Start: ? — End: 2017-11-10

## 2017-11-10 MED ORDER — ATORVASTATIN CALCIUM 40 MG PO TABS
80.00 | ORAL_TABLET | ORAL | Status: DC
Start: 2017-11-12 — End: 2017-11-10

## 2017-11-10 MED ORDER — HEPARIN SOD (PORCINE) IN D5W 25000-5 UT/500ML-% IV SOLN
0.00 | INTRAVENOUS | Status: DC
Start: ? — End: 2017-11-10

## 2017-11-10 MED ORDER — ACETAMINOPHEN 325 MG PO TABS
650.00 | ORAL_TABLET | ORAL | Status: DC
Start: ? — End: 2017-11-10

## 2017-11-10 MED ORDER — SODIUM CHLORIDE 0.9 % IV SOLN
0.00 | INTRAVENOUS | Status: DC
Start: ? — End: 2017-11-10

## 2017-11-10 MED ORDER — GENERIC EXTERNAL MEDICATION
6.25 | Status: DC
Start: ? — End: 2017-11-10

## 2017-11-10 MED ORDER — MUPIROCIN 2 % EX OINT
TOPICAL_OINTMENT | CUTANEOUS | Status: DC
Start: 2017-11-12 — End: 2017-11-10

## 2017-11-10 MED ORDER — TOPIRAMATE 100 MG PO TABS
50.00 | ORAL_TABLET | ORAL | Status: DC
Start: 2017-11-12 — End: 2017-11-10

## 2017-11-10 MED ORDER — CLOPIDOGREL BISULFATE 75 MG PO TABS
75.00 | ORAL_TABLET | ORAL | Status: DC
Start: 2017-11-12 — End: 2017-11-10

## 2017-11-10 MED ORDER — PANTOPRAZOLE SODIUM 40 MG PO TBEC
40.00 | DELAYED_RELEASE_TABLET | ORAL | Status: DC
Start: 2017-11-12 — End: 2017-11-10

## 2017-11-10 MED ORDER — ASPIRIN 81 MG PO CHEW
81.00 | CHEWABLE_TABLET | ORAL | Status: DC
Start: 2017-11-12 — End: 2017-11-10

## 2017-11-10 MED ORDER — ONDANSETRON HCL 4 MG/2ML IJ SOLN
4.00 | INTRAMUSCULAR | Status: DC
Start: ? — End: 2017-11-10

## 2017-11-10 NOTE — Telephone Encounter (Signed)
Called patient's cell phone, no answer. LVM asking her to call back.  I also called the hospital in [place] but was not able to reach patient's room as she is apparently in the middle of being transferred from one unit to another.  I am able to see some of the records in Care Everywhere. Looks like she was admitted with a STEMI and underwent catheterization yesterday.  I will attempt to call her back tomorrow.  Latrelle Dodrill, MD

## 2017-11-10 NOTE — Telephone Encounter (Signed)
Pt called to let Dr. Pollie Meyer know that she in the hospital at [facility] in [place]. Pt said she has had a heart attack due to the blood clot in her heart. Pt would like Pollie Meyer to call her there to discuss this. Please advise

## 2017-11-11 ENCOUNTER — Encounter: Payer: Self-pay | Admitting: Family Medicine

## 2017-11-11 NOTE — Telephone Encounter (Signed)
Late entry - called patient and was able to reach her around 12pm today. She was indeed admitted with a STEMI and had cardiac cath with interventions. Went into vfib twice and was defibrillated. She is currently in the ICU in [place].  She plans to move back to [place] in the next 3 weeks. Will quit her job if they do not transfer her back. She is aware I am about to go out on maternity leave.  I have scheduled her to see Dr. Deirdre Priest on 8/20 as she should be back here by then. I will meet with Dr. Deirdre Priest and fill him in on patient's situation as she will need a lot of care coordination once she is back in .  Offered encouraging words. She has been through a lot recently. Patient appreciative.   Latrelle Dodrill, MD

## 2017-11-11 NOTE — Telephone Encounter (Signed)
Pt called back.  Advised that provider was in clinic seeing other patients but I would let her know she called.  [place] can be reached @ [phone]. [place], Maryjo Rochester, CMA

## 2017-11-24 ENCOUNTER — Telehealth: Payer: Self-pay | Admitting: Family Medicine

## 2017-11-24 NOTE — Telephone Encounter (Signed)
Pt would like Dr. Pollie Meyer to call her.

## 2017-11-24 NOTE — Telephone Encounter (Signed)
Called and spoke with patient. She is still in [place]. She wanted to give me an update that she followed up with a cardiologist in [place], who told her she has CAD and may need another stent. Wants her to start cardiac rehab in [place] and continue it when she returns to [facility]. Also has been told she needs a sleep study when she gets here as they are not able to put her on a beta blocker as her pulse reportedly drops in the 30s while sleeping.  Patient is aware that I will be going out on maternity leave any day now. She has an appointment scheduled with Dr. Deirdre Priest later this month when she returns to [facility] and he will help coordinate her care while I am out. He is familiar with her history. In my mind one of the first things that should be done when she arrives in [place] is getting her established with a cardiologist here in [place].  FYI to Dr. Deirdre Priest.  Latrelle Dodrill, MD

## 2017-11-25 ENCOUNTER — Telehealth: Payer: Self-pay | Admitting: Family Medicine

## 2017-11-25 NOTE — Telephone Encounter (Signed)
Pt also needs a copy of her future appointment as well.

## 2017-11-25 NOTE — Telephone Encounter (Signed)
Pt called and would like to know if she can have a copy of all completed and not completed appointments from this last year. Her employer needs them for the reasonable accomodation. Pt is still in [place] and would need them mailed to her.

## 2017-12-01 NOTE — Telephone Encounter (Signed)
Called pt to confirm address, wants it sent to PO box address in chart. appts mailed. Aydn Ferrara Bruna Potter, CMA

## 2017-12-06 ENCOUNTER — Ambulatory Visit: Payer: Self-pay | Admitting: Family Medicine

## 2017-12-14 NOTE — Telephone Encounter (Signed)
Pt calling about these appts that were supposed to be mailed to her. It's been 2 weeks and she still has not received them in the mail. Pt would like for them to be sent to her another way because her employer is fussing at her for not having them yet. Please call pt back at [phone] to see how she can get these

## 2017-12-16 NOTE — Telephone Encounter (Signed)
Pt will create a MYCHART account to print them herself.  Created mychart activation letter and lmovm with activation code. Nishat Livingston, Maryjo Rochester, CMA

## 2018-03-10 ENCOUNTER — Encounter (HOSPITAL_COMMUNITY): Payer: Self-pay | Admitting: *Deleted

## 2018-03-10 ENCOUNTER — Emergency Department (HOSPITAL_COMMUNITY)
Admission: EM | Admit: 2018-03-10 | Discharge: 2018-03-10 | Disposition: A | Discharge status: Home / Self Care | Payer: Medicaid - Out of State | Attending: Emergency Medicine | Admitting: Emergency Medicine

## 2018-03-10 ENCOUNTER — Emergency Department (HOSPITAL_COMMUNITY): Payer: Medicaid - Out of State

## 2018-03-10 ENCOUNTER — Other Ambulatory Visit: Payer: Self-pay

## 2018-03-10 DIAGNOSIS — K30 Functional dyspepsia: Secondary | ICD-10-CM | POA: Insufficient documentation

## 2018-03-10 DIAGNOSIS — I110 Hypertensive heart disease with heart failure: Secondary | ICD-10-CM | POA: Insufficient documentation

## 2018-03-10 DIAGNOSIS — F17210 Nicotine dependence, cigarettes, uncomplicated: Secondary | ICD-10-CM | POA: Diagnosis not present

## 2018-03-10 DIAGNOSIS — I5032 Chronic diastolic (congestive) heart failure: Secondary | ICD-10-CM | POA: Insufficient documentation

## 2018-03-10 DIAGNOSIS — R079 Chest pain, unspecified: Secondary | ICD-10-CM | POA: Diagnosis present

## 2018-03-10 HISTORY — DX: Atherosclerotic heart disease of native coronary artery without angina pectoris: I25.10

## 2018-03-10 LAB — CBC
HCT: 38.3 % (ref 36.0–46.0)
Hemoglobin: 13.1 g/dL (ref 12.0–15.0)
MCH: 28.4 pg (ref 26.0–34.0)
MCHC: 34.2 g/dL (ref 30.0–36.0)
MCV: 82.9 fL (ref 80.0–100.0)
PLATELETS: 335 10*3/uL (ref 150–400)
RBC: 4.62 MIL/uL (ref 3.87–5.11)
RDW: 14.2 % (ref 11.5–15.5)
WBC: 12.6 10*3/uL — ABNORMAL HIGH (ref 4.0–10.5)
nRBC: 0 % (ref 0.0–0.2)

## 2018-03-10 LAB — BASIC METABOLIC PANEL
ANION GAP: 6 (ref 5–15)
BUN: 9 mg/dL (ref 6–20)
CO2: 25 mmol/L (ref 22–32)
CREATININE: 0.81 mg/dL (ref 0.44–1.00)
Calcium: 9.1 mg/dL (ref 8.9–10.3)
Chloride: 108 mmol/L (ref 98–111)
GFR calc Af Amer: >60 mL/min (ref >60)
GLUCOSE: 109 mg/dL — AB (ref 70–99)
Potassium: 3.7 mmol/L (ref 3.5–5.1)
Sodium: 139 mmol/L (ref 135–145)

## 2018-03-10 LAB — I-STAT TROPONIN, ED
TROPONIN I, POC: 0.01 ng/mL (ref 0.00–0.08)
Troponin i, poc: 0 ng/mL (ref 0.00–0.08)

## 2018-03-10 LAB — I-STAT BETA HCG BLOOD, ED (MC, WL, AP ONLY): I-stat hCG, quantitative: <5 m[IU]/mL (ref <5)

## 2018-03-10 LAB — D-DIMER, QUANTITATIVE: D-Dimer, Quant: 0.7 ug/mL-FEU — ABNORMAL HIGH (ref 0.00–0.50)

## 2018-03-10 MED ORDER — IOPAMIDOL (ISOVUE-370) INJECTION 76%
100.0000 mL | Freq: Once | INTRAVENOUS | Status: AC | PRN
  Administered 2018-03-10: 100 mL via INTRAVENOUS

## 2018-03-10 MED ORDER — OMEPRAZOLE 20 MG PO CPDR: 20.0000 mg | DELAYED_RELEASE_CAPSULE | Freq: Every day | ORAL | 0 refills | Status: DC

## 2018-03-10 MED ORDER — ALUM & MAG HYDROXIDE-SIMETH 200-200-20 MG/5ML PO SUSP
30.0000 mL | Freq: Once | ORAL | Status: AC
  Administered 2018-03-10: 30 mL via ORAL
  Filled 2018-03-10: qty 30

## 2018-03-10 MED ORDER — IOPAMIDOL (ISOVUE-370) INJECTION 76%
INTRAVENOUS | Status: AC
  Filled 2018-03-10: qty 100

## 2018-03-10 NOTE — ED Notes (Signed)
Patient transported to X-ray 

## 2018-03-10 NOTE — ED Notes (Signed)
Patient verbalizes understanding of discharge instructions. Opportunity for questioning and answers were provided. Armband removed by staff, pt discharged from ED ambulatory.   

## 2018-03-10 NOTE — ED Notes (Signed)
ED Provider at bedside. 

## 2018-03-10 NOTE — ED Triage Notes (Signed)
Pt is here with chest pain to upper chest and she describes as indigestion.  Pt has history of 2 MIs and 2 stents this year.   Pt has history of clotting issues and dvt/PEs

## 2018-03-10 NOTE — Discharge Instructions (Addendum)
Your chest discomfort may be due to indigestion.  Take prilosec daily.  Call and follow up with cardiology for further evaluation.  Return if you have any concerns.

## 2018-03-10 NOTE — ED Notes (Signed)
Patient transported to CT 

## 2018-03-10 NOTE — ED Notes (Signed)
Pt states an EKG was completed in triage, however we have been unable to locate.  Will repeat

## 2018-03-10 NOTE — ED Provider Notes (Signed)
[facility] EMERGENCY DEPARTMENT Provider Note   CSN: [id number] Arrival date & time: 03/10/18  1640     History   Chief Complaint Chief Complaint  Patient presents with  . Chest Pain    HPI Angelica Brown is a 42 y.o. female.  The history is provided by the patient and medical records. No language interpreter was used.     42 year old female with history of CAD, prior MIs, prior PE currently on Xarelto and Effient, gestational diabetes, sickle cell trait, hypertension presenting for evaluation of chest pain.  Patient report approximately 2 hours ago, she was in [employer] when she developed discomfort to her mid chest.  She described as an indigestion sensation, that spread across the chest and somewhat reminiscent of her prior MI that she had several months ago.  Symptom has since subsided and rates her discomfort as 4 out of 10.  She did not report any associated diaphoresis, lightheadedness, dizziness,, or nausea with that.  She did endorse some shortness of breath yesterday but not so much today.  She drives herself here due to her concern.  She mentioned having MIs secondary to recurrent blood clots from lupus.  She has been compliant with her medication.  She felt that her indigestion could be related to eating food with hot sauce earlier in the day.  She denies any cough or hemoptysis.  No pleuritic chest pain.  Past Medical History:  Diagnosis Date  . CHOLELITHIASIS   . Coronary artery disease   . Dermatophytosis of nail   . 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 GERD (gastroesophageal reflux disease)   . Gestational diabetes    "w/all 4 pregnancies"  . Heart murmur   . Hypertension   . Metrorrhagia   . Migraine    "at least twice/month" (05/28/2016)  . Nystagmus 06/30/2016  . OBESITY, MORBID   . OBESITY, NOS   . Pneumonia 11/2014  . POSTURAL LIGHTHEADEDNESS    . Sickle cell trait (HCC)   . STEMI (ST elevation myocardial infarction) (HCC) 11/09/2017   in [place], [place] - see records in Care Everywhere  . SYNCOPE   . Throat pain   . TOBACCO DEPENDENCE   . URI   . Urinary frequency     Patient Active Problem List   Diagnosis Date Noted  . 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 Chronic diarrhea 10/27/2016  . Seborrheic dermatitis 10/27/2016  . Nystagmus 06/30/2016  . Chronic diastolic heart failure (HCC) 06/13/2016  . Acute stress reaction 06/09/2016  . DVT of upper extremity (deep vein thrombosis) (HCC) 06/09/2016  . Superficial venous thrombosis of right upper extremity   . Sinus bradycardia   . Mastoiditis of right side 05/29/2016  . 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 Papilledema of both eyes   . Bacterial meningitis   . Headache 05/26/2016  . Onychomycosis 04/16/2013  . Family history of breast cancer 01/17/2013  . Sickle cell trait (HCC)   . Vertigo 08/29/2008  . OBESITY, MORBID 07/10/2008  . SYNCOPE 10/24/2006  . CHOLELITHIASIS 07/13/2006  . TOBACCO DEPENDENCE 06/16/2006  . METRORRHAGIA 06/16/2006    Past Surgical History:  Procedure Laterality Date  . BARTHOLIN GLAND CYST EXCISION Bilateral 2004  . EXCISIONAL HEMORRHOIDECTOMY  2006   "removed a blood clot from one"  . TUBAL LIGATION  2007  . WISDOM TOOTH EXTRACTION  1998     OB History  Gravida  5   Para  5   Term  4   Preterm  1   AB      Living  4     SAB      TAB      Ectopic      Multiple  1   Live Births               Home Medications    Prior to Admission medications   Medication Sig Start Date End Date Taking? Authorizing Provider  rivaroxaban (XARELTO) 20 MG TABS tablet TAKE 1 TABLET BY MOUTH ONCE DAILY WITH SUPPER 10/11/17   Latrelle Dodrill, MD    Family History Family History  Problem Relation Age of Onset  . Breast cancer Mother   . Cancer Father        MELANOMA  . Diabetes Father   .  Diabetes Paternal Grandmother   . Diabetes Paternal Grandfather     Social History Social History   Tobacco Use  . Smoking status: Current Every Day Smoker    Packs/day: 0.25    Years: 18.00    Pack years: 4.50    Types: Cigarettes  . Smokeless tobacco: Never Used  Substance Use Topics  . Alcohol use: No  . Drug use: No     Allergies   Ampicillin; Strawberry extract; Cephalexin; and Prednisone   Review of Systems Review of Systems  All other systems reviewed and are negative.    Physical Exam Updated Vital Signs BP 113/65 (BP Location: Left Arm)   Pulse 72   Temp (!) 97.5 F (36.4 C) (Oral)   Resp 16   SpO2 97%   Physical Exam  Constitutional: She is oriented to person, place, and time. She appears well-developed and well-nourished. No distress.  Patient is moderately obese, resting comfortably in no acute discomfort.  HENT:  Head: Atraumatic.  Eyes: Conjunctivae are normal.  Neck: Neck supple. No JVD present.  Cardiovascular: Normal rate and regular rhythm.  Pulmonary/Chest: Effort normal and breath sounds normal.  Abdominal: Soft. She exhibits no distension.  Musculoskeletal: Normal range of motion. She exhibits no edema.  Neurological: She is alert and oriented to person, place, and time.  Skin: No rash noted.  Psychiatric: She has a normal mood and affect.  Nursing note and vitals reviewed.    ED Treatments / Results  Labs (all labs ordered are listed, but only abnormal results are displayed) Labs Reviewed  BASIC METABOLIC PANEL - Abnormal; Notable for the following components:      Result Value   Glucose, Bld 109 (*)    All other components within normal limits  CBC - Abnormal; Notable for the following components:   WBC 12.6 (*)    All other components within normal limits  D-DIMER, QUANTITATIVE (NOT AT [facility]) - Abnormal; Notable for the following components:   D-Dimer, Quant 0.70 (*)    All other components within normal limits  I-STAT  TROPONIN, ED  I-STAT BETA HCG BLOOD, ED ([facility], [facility], [facility] ONLY)  I-STAT TROPONIN, ED    EKG EKG Interpretation  Date/Time:  Friday March 10 2018 18:12:23 EST Ventricular Rate:  58 PR Interval:    QRS Duration: 69 QT Interval:  454 QTC Calculation: 446 R Axis:   -163 Text Interpretation:  Right and left arm electrode reversal, interpretation assumes no reversal Normal sinus rhythm limb lead reversal is new since last tracing Confirmed by [name] [phone]) on 03/10/2018 6:16:32 PM  Radiology Dg Chest 2 View  Result Date: 03/10/2018 CLINICAL DATA:  Chest pain today.  History of stents. EXAM: CHEST - 2 VIEW COMPARISON:  12/06/2013 and 07/27/2011. FINDINGS: The heart size and mediastinal contours are stable. The pulmonary vascularity is stable. There is mild interstitial prominence which appears unchanged. No confluent airspace opacity, pleural effusion or pneumothorax. There is a mild convex right thoracic scoliosis without acute osseous findings. IMPRESSION: Stable mild chronic pulmonary interstitial prominence. No acute cardiopulmonary process. Electronically Signed   By: Carey Bullocks M.D.   On: 03/10/2018 19:46   Ct Angio Chest Pe W And/or Wo Contrast  Result Date: 03/10/2018 CLINICAL DATA:  Chest pain. History of 2 MIs. History of clotting issues and DVT/PEs. EXAM: CT ANGIOGRAPHY CHEST WITH CONTRAST TECHNIQUE: Multidetector CT imaging of the chest was performed using the standard protocol during bolus administration of intravenous contrast. Multiplanar CT image reconstructions and MIPs were obtained to evaluate the vascular anatomy. CONTRAST:  ISOVUE-370 IOPAMIDOL (ISOVUE-370) INJECTION 76% COMPARISON:  Chest CT angiogram dated 07/11/2006. FINDINGS: Cardiovascular: Majority of the most peripheral segmental and subsegmental pulmonary arteries cannot be definitively characterized due to patient breathing motion artifact, however, there is no central obstructing pulmonary embolism  identified within the main, lobar or central segmental pulmonary arteries bilaterally. Cardiomegaly. No pericardial effusion. No thoracic aortic aneurysm or evidence of aortic dissection. Mediastinum/Nodes: No mass or enlarged lymph nodes appreciated within the mediastinum or perihilar regions. Esophagus is unremarkable. Trachea and central bronchi are unremarkable. Lungs/Pleura: Lungs are clear. No pleural effusion or pneumothorax. Upper Abdomen: Limited images of the upper abdomen are unremarkable. Musculoskeletal: Mild scoliosis. No acute or suspicious osseous finding. Review of the MIP images confirms the above findings. IMPRESSION: 1. No acute findings. No central obstructing pulmonary embolism identified. However, the majority of the peripheral pulmonary artery branches cannot be definitively characterized due to extensive patient breathing motion artifact. 2. No evidence of pneumonia or pulmonary edema. 3. Cardiomegaly. Electronically Signed   By: Bary Richard M.D.   On: 03/10/2018 21:16    Procedures Procedures (including critical care time)  Medications Ordered in ED Medications  iopamidol (ISOVUE-370) 76 % injection (has no administration in time range)  alum & mag hydroxide-simeth (MAALOX/MYLANTA) 200-200-20 MG/5ML suspension 30 mL (30 mLs Oral Given 03/10/18 1827)  iopamidol (ISOVUE-370) 76 % injection 100 mL (100 mLs Intravenous Contrast Given 03/10/18 2027)     Initial Impression / Assessment and Plan / ED Course  I have reviewed the triage vital signs and the nursing notes.  Pertinent labs & imaging results that were available during my care of the patient were reviewed by me and considered in my medical decision making (see chart for details).     BP 110/62   Pulse (!) 57   Temp (!) 97.5 F (36.4 C) (Oral)   Resp (!) 22   SpO2 96%    Final Clinical Impressions(s) / ED Diagnoses   Final diagnoses:  Indigestion    ED Discharge Orders         Ordered    omeprazole  (PRILOSEC) 20 MG capsule  Daily     03/10/18 2215         6:19 PM Patient here with history of lupus, prior PE/DVT and prior MIs complaining of chest indigestion for the past 2 hours.  Symptom has been improving but not fully resolved.  She has been compliant with her anticoagulants.  She did complain of some shortness of breath yesterday but not today.  Work-up  initiated.  GI cocktail given, d-dimer ordered.  9:24 PM Patient does report resolution of her symptoms after receiving GI cocktail.  She is resting comfortably.  Initial troponin unremarkable.  Mildly elevated d-dimer of 0.70.  A chest CT angiogram obtained showing no acute PE.  Labs are reassuring, mildly elevated WBC of 12.6.  Will obtain serial troponin.  10:14 PM Normal delta trop.  Pt remain pain free.  She is stable to be discharge with close f/u with cardiology.  Return precaution given.  Pt d/c home with PPI.     [name], PA-C 03/10/18 2217    [name], MD 03/11/18 [phone]

## 2018-03-13 ENCOUNTER — Ambulatory Visit: Payer: Self-pay | Admitting: Family Medicine

## 2018-04-17 ENCOUNTER — Other Ambulatory Visit: Payer: Self-pay

## 2018-04-17 ENCOUNTER — Encounter (HOSPITAL_COMMUNITY): Payer: Self-pay | Admitting: Emergency Medicine

## 2018-04-17 ENCOUNTER — Emergency Department (HOSPITAL_COMMUNITY)
Admission: EM | Admit: 2018-04-17 | Discharge: 2018-04-18 | Disposition: A | Discharge status: Home / Self Care | Payer: Medicaid - Out of State | Attending: Emergency Medicine | Admitting: Emergency Medicine

## 2018-04-17 DIAGNOSIS — R350 Frequency of micturition: Secondary | ICD-10-CM | POA: Insufficient documentation

## 2018-04-17 DIAGNOSIS — I10 Essential (primary) hypertension: Secondary | ICD-10-CM | POA: Insufficient documentation

## 2018-04-17 DIAGNOSIS — Z7901 Long term (current) use of anticoagulants: Secondary | ICD-10-CM | POA: Insufficient documentation

## 2018-04-17 DIAGNOSIS — R300 Dysuria: Secondary | ICD-10-CM

## 2018-04-17 DIAGNOSIS — Z79899 Other long term (current) drug therapy: Secondary | ICD-10-CM | POA: Insufficient documentation

## 2018-04-17 DIAGNOSIS — R509 Fever, unspecified: Secondary | ICD-10-CM | POA: Insufficient documentation

## 2018-04-17 DIAGNOSIS — F17210 Nicotine dependence, cigarettes, uncomplicated: Secondary | ICD-10-CM | POA: Insufficient documentation

## 2018-04-17 DIAGNOSIS — R109 Unspecified abdominal pain: Secondary | ICD-10-CM | POA: Insufficient documentation

## 2018-04-17 DIAGNOSIS — I2510 Atherosclerotic heart disease of native coronary artery without angina pectoris: Secondary | ICD-10-CM | POA: Insufficient documentation

## 2018-04-17 DIAGNOSIS — Z7982 Long term (current) use of aspirin: Secondary | ICD-10-CM | POA: Insufficient documentation

## 2018-04-17 LAB — BASIC METABOLIC PANEL
Anion gap: 9 (ref 5–15)
BUN: 9 mg/dL (ref 6–20)
CHLORIDE: 108 mmol/L (ref 98–111)
CO2: 23 mmol/L (ref 22–32)
CREATININE: 0.91 mg/dL (ref 0.44–1.00)
Calcium: 9.1 mg/dL (ref 8.9–10.3)
GFR calc non Af Amer: >60 mL/min (ref >60)
Glucose, Bld: 98 mg/dL (ref 70–99)
Potassium: 3.5 mmol/L (ref 3.5–5.1)
SODIUM: 140 mmol/L (ref 135–145)

## 2018-04-17 LAB — URINALYSIS, ROUTINE W REFLEX MICROSCOPIC
BACTERIA UA: NONE SEEN
Bilirubin Urine: NEGATIVE
GLUCOSE, UA: NEGATIVE mg/dL
Ketones, ur: NEGATIVE mg/dL
Leukocytes, UA: NEGATIVE
Nitrite: NEGATIVE
PROTEIN: NEGATIVE mg/dL
Specific Gravity, Urine: 1.016 (ref 1.005–1.030)
pH: 5 (ref 5.0–8.0)

## 2018-04-17 LAB — I-STAT BETA HCG BLOOD, ED (MC, WL, AP ONLY)

## 2018-04-17 NOTE — ED Triage Notes (Signed)
Pt reports frequent UTIs and states that they normally turn into kidney infections. States that she has had UTI symptoms x 6 days and is now having flank pain. Reports fevers at home, states highest was 101.2 at 1130am states she took tylenol and ibuprofen at home around 3pm

## 2018-04-18 ENCOUNTER — Emergency Department (HOSPITAL_COMMUNITY): Payer: Medicaid - Out of State

## 2018-04-18 LAB — CBC
HEMATOCRIT: 37.7 % (ref 36.0–46.0)
Hemoglobin: 13.3 g/dL (ref 12.0–15.0)
MCH: 28.8 pg (ref 26.0–34.0)
MCHC: 35.3 g/dL (ref 30.0–36.0)
MCV: 81.6 fL (ref 80.0–100.0)
Platelets: 378 10*3/uL (ref 150–400)
RBC: 4.62 MIL/uL (ref 3.87–5.11)
RDW: 14.1 % (ref 11.5–15.5)
WBC: 15.3 10*3/uL — AB (ref 4.0–10.5)
nRBC: 0 % (ref 0.0–0.2)

## 2018-04-18 MED ORDER — NITROFURANTOIN MONOHYD MACRO 100 MG PO CAPS: 100.0000 mg | ORAL_CAPSULE | Freq: Two times a day (BID) | ORAL | 0 refills | Status: DC

## 2018-04-18 MED ORDER — SODIUM CHLORIDE 0.9 % IV BOLUS
1000.0000 mL | Freq: Once | INTRAVENOUS | Status: AC
  Administered 2018-04-18: 1000 mL via INTRAVENOUS

## 2018-04-18 NOTE — Discharge Instructions (Addendum)
Increase your fluid intake and rest as much as possible.  Tylenol and Motrin for any pain and fever.

## 2018-04-18 NOTE — ED Notes (Signed)
PT states understanding of care given, follow up care, and medication prescribed. PT ambulated from ED to car with a steady gait. 

## 2018-04-18 NOTE — ED Provider Notes (Signed)
[facility] EMERGENCY DEPARTMENT Provider Note   CSN: [id number] Arrival date & time: 04/17/18  2207     History   Chief Complaint Chief Complaint  Patient presents with  . Recurrent UTI  . Flank Pain    HPI Angelica Brown is a 42 y.o. female.  HPI Patient presents to the emergency department with urinary symptoms that started yesterday.  The patient states she feels like she is had increasing frequency of urination along with some mild discomfort at the end of her urination stream.  Patient states she is also had back pain.  She states she reports a fever of 101.2 at home.  Patient states she has had urinary tract infections in the past.  States nothing seems to make the condition better or worse.  She states she is not had any vaginal bleeding or discharge.  The patient denies chest pain, shortness of breath, headache,blurred vision, neck pain, fever, cough, weakness, numbness, dizziness, anorexia, edema, abdominal pain, nausea, vomiting, diarrhea, rash, hematemesis, bloody stool, near syncope, or syncope. Past Medical History:  Diagnosis Date  . CHOLELITHIASIS   . Coronary artery disease   . Dermatophytosis of nail   . 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 GERD (gastroesophageal reflux disease)   . Gestational diabetes    "w/all 4 pregnancies"  . Heart murmur   . Hypertension   . Metrorrhagia   . Migraine    "at least twice/month" (05/28/2016)  . Nystagmus 06/30/2016  . OBESITY, MORBID   . OBESITY, NOS   . Pneumonia 11/2014  . POSTURAL LIGHTHEADEDNESS   . Sickle cell trait (HCC)   . STEMI (ST elevation myocardial infarction) (HCC) 11/09/2017   in [place], [place] - see records in Care Everywhere  . SYNCOPE   . Throat pain   . TOBACCO DEPENDENCE   . URI   . Urinary frequency     Patient Active Problem List   Diagnosis Date Noted  . Antiphospholipid  antibody syndrome (HCC) 11/01/2016  . Chronic diarrhea 10/27/2016  . Seborrheic dermatitis 10/27/2016  . Nystagmus 06/30/2016  . Chronic diastolic heart failure (HCC) 06/13/2016  . Acute stress reaction 06/09/2016  . DVT of upper extremity (deep vein thrombosis) (HCC) 06/09/2016  . Superficial venous thrombosis of right upper extremity   . Sinus bradycardia   . Mastoiditis of right side 05/29/2016  . 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 Papilledema of both eyes   . Bacterial meningitis   . Headache 05/26/2016  . Onychomycosis 04/16/2013  . Family history of breast cancer 01/17/2013  . Sickle cell trait (HCC)   . Vertigo 08/29/2008  . OBESITY, MORBID 07/10/2008  . SYNCOPE 10/24/2006  . CHOLELITHIASIS 07/13/2006  . TOBACCO DEPENDENCE 06/16/2006  . METRORRHAGIA 06/16/2006    Past Surgical History:  Procedure Laterality Date  . BARTHOLIN GLAND CYST EXCISION Bilateral 2004  . EXCISIONAL HEMORRHOIDECTOMY  2006   "removed a blood clot from one"  . TUBAL LIGATION  2007  . WISDOM TOOTH EXTRACTION  1998     OB History    Gravida  5   Para  5   Term  4   Preterm  1   AB      Living  4     SAB      TAB      Ectopic      Multiple  1   Live Births  Home Medications    Prior to Admission medications   Medication Sig Start Date End Date Taking? Authorizing Provider  aspirin EC 81 MG tablet Take 81 mg by mouth at bedtime.   Yes [provider]  atorvastatin (LIPITOR) 80 MG tablet Take 80 mg by mouth at bedtime. 02/18/18  Yes [provider]  metoprolol tartrate (LOPRESSOR) 25 MG tablet Take 12.5 mg by mouth at bedtime. 02/16/18  Yes [provider]  omeprazole (PRILOSEC) 20 MG capsule Take 1 capsule (20 mg total) by mouth daily. 03/10/18  Yes [name], PA-C  prasugrel (EFFIENT) 10 MG TABS tablet Take 10 mg by mouth at bedtime.  01/23/18  Yes [provider]  rivaroxaban (XARELTO) 20 MG TABS tablet TAKE 1  TABLET BY MOUTH ONCE DAILY WITH SUPPER Patient taking differently: Take 20 mg by mouth at bedtime.  10/11/17  Yes Latrelle Dodrill, MD  topiramate (TOPAMAX) 50 MG tablet Take 50 mg by mouth at bedtime. 02/17/18  Yes [provider]    Family History Family History  Problem Relation Age of Onset  . Breast cancer Mother   . Cancer Father        MELANOMA  . Diabetes Father   . Diabetes Paternal Grandmother   . Diabetes Paternal Grandfather     Social History Social History   Tobacco Use  . Smoking status: Current Every Day Smoker    Packs/day: 0.25    Years: 18.00    Pack years: 4.50    Types: Cigarettes  . Smokeless tobacco: Never Used  Substance Use Topics  . Alcohol use: No  . Drug use: No     Allergies   Ampicillin; Penicillins; Strawberry extract; Cephalexin; and Prednisone   Review of Systems Review of Systems All other systems negative except as documented in the HPI. All pertinent positives and negatives as reviewed in the HPI.  Physical Exam Updated Vital Signs BP 124/79   Pulse 68   Temp 98.6 F (37 C) (Oral)   Resp 18   Ht 5\' 9"  (1.753 m)   Wt (!) 149.7 kg   LMP 04/13/2018 (Exact Date)   SpO2 97%   BMI 48.73 kg/m   Physical Exam Vitals signs and nursing note reviewed.  Constitutional:      General: She is not in acute distress.    Appearance: She is well-developed.  HENT:     Head: Normocephalic and atraumatic.  Eyes:     Pupils: Pupils are equal, round, and reactive to light.  Neck:     Musculoskeletal: Normal range of motion and neck supple.  Cardiovascular:     Rate and Rhythm: Normal rate and regular rhythm.     Heart sounds: Normal heart sounds. No murmur. No friction rub. No gallop.   Pulmonary:     Effort: Pulmonary effort is normal. No respiratory distress.     Breath sounds: Normal breath sounds. No wheezing.  Abdominal:     General: Bowel sounds are normal. There is no distension.     Palpations: Abdomen is soft.      Tenderness: There is no abdominal tenderness.  Skin:    General: Skin is warm and dry.     Capillary Refill: Capillary refill takes less than 2 seconds.     Findings: No erythema or rash.  Neurological:     Mental Status: She is alert and oriented to person, place, and time.     Motor: No abnormal muscle tone.  Coordination: Coordination normal.  Psychiatric:        Behavior: Behavior normal.      ED Treatments / Results  Labs (all labs ordered are listed, but only abnormal results are displayed) Labs Reviewed  URINALYSIS, ROUTINE W REFLEX MICROSCOPIC - Abnormal; Notable for the following components:      Result Value   Hgb urine dipstick SMALL (*)    All other components within normal limits  CBC - Abnormal; Notable for the following components:   WBC 15.3 (*)    All other components within normal limits  BASIC METABOLIC PANEL  I-STAT BETA HCG BLOOD, ED ([facility], [facility], [facility] ONLY)    EKG None  Radiology Ct Renal Stone Study  Result Date: 04/18/2018 CLINICAL DATA:  Flank pain. Frequent UTIs. EXAM: CT ABDOMEN AND PELVIS WITHOUT CONTRAST TECHNIQUE: Multidetector CT imaging of the abdomen and pelvis was performed following the standard protocol without IV contrast. COMPARISON:  None. FINDINGS: LOWER CHEST: There is no basilar pleural or apical pericardial effusion. HEPATOBILIARY: The hepatic contours and density are normal. There is no intra- or extrahepatic biliary dilatation. There is cholelithiasis without acute inflammation. PANCREAS: The pancreatic parenchymal contours are normal and there is no ductal dilatation. There is no peripancreatic fluid collection. SPLEEN: Normal. ADRENALS/URINARY TRACT: --Adrenal glands: Normal. --Right kidney/ureter: No hydronephrosis, nephroureterolithiasis, perinephric stranding or solid renal mass. --Left kidney/ureter: No hydronephrosis, nephroureterolithiasis, perinephric stranding or solid renal mass. --Urinary bladder: Normal for degree of  distention STOMACH/BOWEL: --Stomach/Duodenum: There is no hiatal hernia or other gastric abnormality. The duodenal course and caliber are normal. --Small bowel: No dilatation or inflammation. --Colon: No focal abnormality. --Appendix: Normal. VASCULAR/LYMPHATIC: Normal course and caliber of the major abdominal vessels. No abdominal or pelvic lymphadenopathy. REPRODUCTIVE: Normal uterus and ovaries. MUSCULOSKELETAL. No bony spinal canal stenosis or focal osseous abnormality. OTHER: None. IMPRESSION: 1. No acute abdominal or pelvic abnormality. 2. Cholelithiasis without acute inflammation. Electronically Signed   By: Deatra RobinsonKevin  Herman M.D.   On: 04/18/2018 04:30    Procedures Procedures (including critical care time)  Medications Ordered in ED Medications  sodium chloride 0.9 % bolus 1,000 mL (1,000 mLs Intravenous New Bag/Given 04/18/18 0423)     Initial Impression / Assessment and Plan / ED Course  I have reviewed the triage vital signs and the nursing notes.  Pertinent labs & imaging results that were available during my care of the patient were reviewed by me and considered in my medical decision making (see chart for details).     I feel that the patient could have urinary symptoms that are causing the issue but she has no other intra-abdominal issues on CT scan.  The patient's urinalysis does not show any overwhelming infection at this time.  The patient does not have any palpable pain on the abdomen on examination.  She denies any vaginal symptoms at this time.  She states this feels like her previous UTIs.  Final Clinical Impressions(s) / ED Diagnoses   Final diagnoses:  None    ED Discharge Orders    None       Charlestine NightLawyer, Armonie Staten, PA-C 04/18/18 0452    Ward, Layla MawKristen N, DO 04/18/18 [phone]

## 2018-04-20 ENCOUNTER — Other Ambulatory Visit: Payer: Self-pay

## 2018-04-20 ENCOUNTER — Encounter: Payer: Self-pay | Admitting: Family Medicine

## 2018-04-20 ENCOUNTER — Ambulatory Visit (INDEPENDENT_AMBULATORY_CARE_PROVIDER_SITE_OTHER): Payer: Medicaid Other | Admitting: Family Medicine

## 2018-04-20 VITALS — BP 124/88 | HR 88 | Temp 98.5°F | Ht 69.0 in | Wt 330.6 lb

## 2018-04-20 DIAGNOSIS — Z23 Encounter for immunization: Secondary | ICD-10-CM | POA: Diagnosis not present

## 2018-04-20 DIAGNOSIS — F430 Acute stress reaction: Secondary | ICD-10-CM

## 2018-04-20 DIAGNOSIS — M329 Systemic lupus erythematosus, unspecified: Secondary | ICD-10-CM | POA: Insufficient documentation

## 2018-04-20 DIAGNOSIS — L219 Seborrheic dermatitis, unspecified: Secondary | ICD-10-CM | POA: Diagnosis not present

## 2018-04-20 DIAGNOSIS — I252 Old myocardial infarction: Secondary | ICD-10-CM | POA: Diagnosis not present

## 2018-04-20 DIAGNOSIS — D6861 Antiphospholipid syndrome: Secondary | ICD-10-CM

## 2018-04-20 DIAGNOSIS — R001 Bradycardia, unspecified: Secondary | ICD-10-CM

## 2018-04-20 MED ORDER — RIVAROXABAN 20 MG PO TABS: ORAL_TABLET | ORAL | 1 refills | Status: DC

## 2018-04-20 MED ORDER — METOPROLOL TARTRATE 25 MG PO TABS: 12.5000 mg | ORAL_TABLET | Freq: Every day | ORAL | 1 refills | Status: DC

## 2018-04-20 MED ORDER — ATORVASTATIN CALCIUM 80 MG PO TABS: 80.0000 mg | ORAL_TABLET | Freq: Every day | ORAL | 3 refills | Status: DC

## 2018-04-20 MED ORDER — PRASUGREL HCL 10 MG PO TABS: 10.0000 mg | ORAL_TABLET | Freq: Every day | ORAL | 0 refills | Status: DC

## 2018-04-20 NOTE — Assessment & Plan Note (Signed)
Refer to cardiology to get established here in [place]. Refills given of effient until she can see cardiology.

## 2018-04-20 NOTE — Patient Instructions (Signed)
Put in referrals to cardiology, dermatology, rheumatology.  Also ordered sleep study - someone will call with an appointment for that  Follow up with me in 3 months, sooner if needed  Refilled medications  Be well, Dr. Pollie Meyer

## 2018-04-20 NOTE — Assessment & Plan Note (Signed)
Needs sleep study. Split night study ordered today.

## 2018-04-20 NOTE — Assessment & Plan Note (Signed)
Refilled xarelto. No bleeding.

## 2018-04-20 NOTE — Assessment & Plan Note (Signed)
Continues to be a problem. Refer to dermatology by patient request.
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
## 2018-04-20 NOTE — Assessment & Plan Note (Signed)
Patient reports prior dx with lupus in [place]. Refer to rheumatology for evaluation here in [place].

## 2018-04-21 ENCOUNTER — Telehealth: Payer: Self-pay | Admitting: Family Medicine

## 2018-05-04 ENCOUNTER — Encounter (HOSPITAL_COMMUNITY): Payer: Self-pay

## 2018-05-04 ENCOUNTER — Emergency Department (HOSPITAL_COMMUNITY): Payer: Medicaid Other

## 2018-05-04 ENCOUNTER — Encounter (HOSPITAL_COMMUNITY)
Admission: EM | Disposition: A | Discharge status: Home / Self Care | Payer: Self-pay | Source: Home / Self Care | Attending: Interventional Cardiology

## 2018-05-04 ENCOUNTER — Other Ambulatory Visit: Payer: Self-pay

## 2018-05-04 ENCOUNTER — Inpatient Hospital Stay (HOSPITAL_COMMUNITY): Payer: Medicaid Other

## 2018-05-04 ENCOUNTER — Inpatient Hospital Stay (HOSPITAL_COMMUNITY)
Admission: EM | Admit: 2018-05-04 | Discharge: 2018-05-08 | DRG: 250 | Disposition: A | Discharge status: Home / Self Care | Payer: Medicaid Other | Attending: Cardiology | Admitting: Cardiology

## 2018-05-04 DIAGNOSIS — Z955 Presence of coronary angioplasty implant and graft: Secondary | ICD-10-CM

## 2018-05-04 DIAGNOSIS — Z7982 Long term (current) use of aspirin: Secondary | ICD-10-CM

## 2018-05-04 DIAGNOSIS — T45526A Underdosing of antithrombotic drugs, initial encounter: Secondary | ICD-10-CM | POA: Diagnosis present

## 2018-05-04 DIAGNOSIS — Z6841 Body Mass Index (BMI) 40.0 and over, adult: Secondary | ICD-10-CM

## 2018-05-04 DIAGNOSIS — Z808 Family history of malignant neoplasm of other organs or systems: Secondary | ICD-10-CM

## 2018-05-04 DIAGNOSIS — I25119 Atherosclerotic heart disease of native coronary artery with unspecified angina pectoris: Secondary | ICD-10-CM | POA: Diagnosis present

## 2018-05-04 DIAGNOSIS — R001 Bradycardia, unspecified: Secondary | ICD-10-CM | POA: Diagnosis present

## 2018-05-04 DIAGNOSIS — G4733 Obstructive sleep apnea (adult) (pediatric): Secondary | ICD-10-CM | POA: Diagnosis present

## 2018-05-04 DIAGNOSIS — Z833 Family history of diabetes mellitus: Secondary | ICD-10-CM

## 2018-05-04 DIAGNOSIS — I272 Pulmonary hypertension, unspecified: Secondary | ICD-10-CM | POA: Diagnosis present

## 2018-05-04 DIAGNOSIS — I214 Non-ST elevation (NSTEMI) myocardial infarction: Secondary | ICD-10-CM

## 2018-05-04 DIAGNOSIS — I255 Ischemic cardiomyopathy: Secondary | ICD-10-CM | POA: Diagnosis not present

## 2018-05-04 DIAGNOSIS — Y92239 Unspecified place in hospital as the place of occurrence of the external cause: Secondary | ICD-10-CM | POA: Diagnosis not present

## 2018-05-04 DIAGNOSIS — I110 Hypertensive heart disease with heart failure: Secondary | ICD-10-CM | POA: Diagnosis present

## 2018-05-04 DIAGNOSIS — I249 Acute ischemic heart disease, unspecified: Secondary | ICD-10-CM | POA: Diagnosis not present

## 2018-05-04 DIAGNOSIS — I5032 Chronic diastolic (congestive) heart failure: Secondary | ICD-10-CM | POA: Diagnosis not present

## 2018-05-04 DIAGNOSIS — M549 Dorsalgia, unspecified: Secondary | ICD-10-CM | POA: Diagnosis not present

## 2018-05-04 DIAGNOSIS — Z91128 Patient's intentional underdosing of medication regimen for other reason: Secondary | ICD-10-CM

## 2018-05-04 DIAGNOSIS — R0602 Shortness of breath: Secondary | ICD-10-CM | POA: Diagnosis not present

## 2018-05-04 DIAGNOSIS — Z91018 Allergy to other foods: Secondary | ICD-10-CM

## 2018-05-04 DIAGNOSIS — Z86718 Personal history of other venous thrombosis and embolism: Secondary | ICD-10-CM

## 2018-05-04 DIAGNOSIS — Z7901 Long term (current) use of anticoagulants: Secondary | ICD-10-CM

## 2018-05-04 DIAGNOSIS — I252 Old myocardial infarction: Secondary | ICD-10-CM

## 2018-05-04 DIAGNOSIS — T82867A Thrombosis of cardiac prosthetic devices, implants and grafts, initial encounter: Principal | ICD-10-CM | POA: Diagnosis present

## 2018-05-04 DIAGNOSIS — Z880 Allergy status to penicillin: Secondary | ICD-10-CM | POA: Diagnosis not present

## 2018-05-04 DIAGNOSIS — D6861 Antiphospholipid syndrome: Secondary | ICD-10-CM | POA: Diagnosis present

## 2018-05-04 DIAGNOSIS — I2119 ST elevation (STEMI) myocardial infarction involving other coronary artery of inferior wall: Secondary | ICD-10-CM | POA: Diagnosis present

## 2018-05-04 DIAGNOSIS — I5022 Chronic systolic (congestive) heart failure: Secondary | ICD-10-CM | POA: Diagnosis present

## 2018-05-04 DIAGNOSIS — Z888 Allergy status to other drugs, medicaments and biological substances status: Secondary | ICD-10-CM

## 2018-05-04 DIAGNOSIS — Y838 Other surgical procedures as the cause of abnormal reaction of the patient, or of later complication, without mention of misadventure at the time of the procedure: Secondary | ICD-10-CM | POA: Diagnosis present

## 2018-05-04 DIAGNOSIS — D573 Sickle-cell trait: Secondary | ICD-10-CM | POA: Diagnosis present

## 2018-05-04 DIAGNOSIS — L930 Discoid lupus erythematosus: Secondary | ICD-10-CM | POA: Diagnosis present

## 2018-05-04 DIAGNOSIS — Z8632 Personal history of gestational diabetes: Secondary | ICD-10-CM | POA: Diagnosis not present

## 2018-05-04 DIAGNOSIS — I2510 Atherosclerotic heart disease of native coronary artery without angina pectoris: Secondary | ICD-10-CM | POA: Diagnosis not present

## 2018-05-04 DIAGNOSIS — Z9851 Tubal ligation status: Secondary | ICD-10-CM

## 2018-05-04 DIAGNOSIS — I959 Hypotension, unspecified: Secondary | ICD-10-CM | POA: Diagnosis present

## 2018-05-04 DIAGNOSIS — T463X5A Adverse effect of coronary vasodilators, initial encounter: Secondary | ICD-10-CM | POA: Diagnosis not present

## 2018-05-04 DIAGNOSIS — T45516A Underdosing of anticoagulants, initial encounter: Secondary | ICD-10-CM | POA: Diagnosis not present

## 2018-05-04 DIAGNOSIS — X58XXXA Exposure to other specified factors, initial encounter: Secondary | ICD-10-CM | POA: Diagnosis present

## 2018-05-04 DIAGNOSIS — Z803 Family history of malignant neoplasm of breast: Secondary | ICD-10-CM

## 2018-05-04 DIAGNOSIS — Z79899 Other long term (current) drug therapy: Secondary | ICD-10-CM

## 2018-05-04 DIAGNOSIS — F17210 Nicotine dependence, cigarettes, uncomplicated: Secondary | ICD-10-CM | POA: Diagnosis present

## 2018-05-04 DIAGNOSIS — I2111 ST elevation (STEMI) myocardial infarction involving right coronary artery: Secondary | ICD-10-CM | POA: Diagnosis not present

## 2018-05-04 HISTORY — DX: Acute ischemic heart disease, unspecified: I24.9

## 2018-05-04 HISTORY — PX: LEFT HEART CATH AND CORONARY ANGIOGRAPHY: CATH118249

## 2018-05-04 HISTORY — PX: CORONARY THROMBECTOMY: CATH118304

## 2018-05-04 LAB — I-STAT BETA HCG BLOOD, ED (MC, WL, AP ONLY): I-stat hCG, quantitative: <5 m[IU]/mL (ref <5)

## 2018-05-04 LAB — BASIC METABOLIC PANEL
Anion gap: 11 (ref 5–15)
BUN: 11 mg/dL (ref 6–20)
CO2: 23 mmol/L (ref 22–32)
Calcium: 9 mg/dL (ref 8.9–10.3)
Chloride: 106 mmol/L (ref 98–111)
Creatinine, Ser: 0.7 mg/dL (ref 0.44–1.00)
GFR calc Af Amer: >60 mL/min (ref >60)
GFR calc non Af Amer: >60 mL/min (ref >60)
Glucose, Bld: 115 mg/dL — ABNORMAL HIGH (ref 70–99)
Potassium: 3.5 mmol/L (ref 3.5–5.1)
Sodium: 140 mmol/L (ref 135–145)

## 2018-05-04 LAB — TSH: TSH: 0.435 u[IU]/mL (ref 0.350–4.500)

## 2018-05-04 LAB — CBC
HCT: 39.6 % (ref 36.0–46.0)
HEMOGLOBIN: 13.3 g/dL (ref 12.0–15.0)
MCH: 28.1 pg (ref 26.0–34.0)
MCHC: 33.6 g/dL (ref 30.0–36.0)
MCV: 83.5 fL (ref 80.0–100.0)
Platelets: 310 10*3/uL (ref 150–400)
RBC: 4.74 MIL/uL (ref 3.87–5.11)
RDW: 14.6 % (ref 11.5–15.5)
WBC: 13.4 10*3/uL — ABNORMAL HIGH (ref 4.0–10.5)
nRBC: 0 % (ref 0.0–0.2)

## 2018-05-04 LAB — I-STAT TROPONIN, ED: Troponin i, poc: 0.09 ng/mL (ref 0.00–0.08)

## 2018-05-04 LAB — PROTIME-INR
INR: 0.9
Prothrombin Time: 12.1 seconds (ref 11.4–15.2)

## 2018-05-04 LAB — LIPID PANEL
Cholesterol: 138 mg/dL (ref 0–200)
HDL: 44 mg/dL (ref >40)
LDL Cholesterol: 87 mg/dL (ref 0–99)
Total CHOL/HDL Ratio: 3.1 RATIO
Triglycerides: 35 mg/dL (ref <150)
VLDL: 7 mg/dL (ref 0–40)

## 2018-05-04 LAB — TROPONIN I
TROPONIN I: 2.32 ng/mL — AB (ref <0.03)
Troponin I: 6.96 ng/mL (ref <0.03)

## 2018-05-04 LAB — ECHOCARDIOGRAM COMPLETE
Height: 69 in
Weight: 5216 oz

## 2018-05-04 LAB — POCT ACTIVATED CLOTTING TIME
Activated Clotting Time: 334 seconds
Activated Clotting Time: 191 seconds
Activated Clotting Time: 153 seconds

## 2018-05-04 LAB — MRSA PCR SCREENING: MRSA by PCR: NEGATIVE

## 2018-05-04 LAB — HEPARIN LEVEL (UNFRACTIONATED): Heparin Unfractionated: <0.1 IU/mL — ABNORMAL LOW (ref 0.30–0.70)

## 2018-05-04 LAB — HEMOGLOBIN A1C
Hgb A1c MFr Bld: 5.8 % — ABNORMAL HIGH (ref 4.8–5.6)
Mean Plasma Glucose: 119.76 mg/dL

## 2018-05-04 LAB — APTT: aPTT: 31 seconds (ref 24–36)

## 2018-05-04 SURGERY — LEFT HEART CATH AND CORONARY ANGIOGRAPHY
Anesthesia: LOCAL

## 2018-05-04 MED ORDER — SODIUM CHLORIDE 0.9 % WEIGHT BASED INFUSION
0.5000 mL/kg/h | INTRAVENOUS | Status: AC
  Administered 2018-05-04: 0.5 mL/kg/h via INTRAVENOUS

## 2018-05-04 MED ORDER — NITROGLYCERIN IN D5W 200-5 MCG/ML-% IV SOLN
0.0000 ug/min | INTRAVENOUS | Status: DC
  Administered 2018-05-04: 5 ug/min via INTRAVENOUS
  Filled 2018-05-04: qty 250

## 2018-05-04 MED ORDER — PERFLUTREN LIPID MICROSPHERE
1.0000 mL | INTRAVENOUS | Status: AC | PRN
  Filled 2018-05-04: qty 10

## 2018-05-04 MED ORDER — LIDOCAINE HCL (PF) 1 % IJ SOLN
INTRAMUSCULAR | Status: DC | PRN
  Administered 2018-05-04: 2 mL via INTRADERMAL
  Administered 2018-05-04: 15 mL via INTRADERMAL

## 2018-05-04 MED ORDER — ATORVASTATIN CALCIUM 80 MG PO TABS
80.0000 mg | ORAL_TABLET | Freq: Every day | ORAL | Status: DC
  Administered 2018-05-04 – 2018-05-07 (×4): 80 mg via ORAL
  Filled 2018-05-04 (×4): qty 1

## 2018-05-04 MED ORDER — TIROFIBAN HCL IN NACL 5-0.9 MG/100ML-% IV SOLN
0.1500 ug/kg/min | INTRAVENOUS | Status: AC
  Administered 2018-05-04 – 2018-05-05 (×6): 0.15 ug/kg/min via INTRAVENOUS
  Filled 2018-05-04 (×6): qty 100

## 2018-05-04 MED ORDER — HEPARIN SODIUM (PORCINE) 1000 UNIT/ML IJ SOLN
INTRAMUSCULAR | Status: DC | PRN
  Administered 2018-05-04: 7000 [IU] via INTRAVENOUS
  Administered 2018-05-04: 2500 [IU] via INTRAVENOUS

## 2018-05-04 MED ORDER — SODIUM CHLORIDE 0.9% FLUSH
3.0000 mL | Freq: Two times a day (BID) | INTRAVENOUS | Status: DC
  Administered 2018-05-04 – 2018-05-08 (×6): 3 mL via INTRAVENOUS

## 2018-05-04 MED ORDER — TIROFIBAN HCL IN NACL 5-0.9 MG/100ML-% IV SOLN
INTRAVENOUS | Status: AC | PRN
  Administered 2018-05-04: 0.15 ug/kg/min via INTRAVENOUS
  Administered 2018-05-04: 12:00:00

## 2018-05-04 MED ORDER — FENTANYL CITRATE (PF) 100 MCG/2ML IJ SOLN
INTRAMUSCULAR | Status: DC | PRN
  Administered 2018-05-04: 50 ug via INTRAVENOUS
  Administered 2018-05-04 (×2): 25 ug via INTRAVENOUS

## 2018-05-04 MED ORDER — SODIUM CHLORIDE 0.9% FLUSH: 3.0000 mL | Freq: Once | INTRAVENOUS | Status: DC

## 2018-05-04 MED ORDER — ONDANSETRON HCL 4 MG/2ML IJ SOLN
4.0000 mg | Freq: Four times a day (QID) | INTRAMUSCULAR | Status: DC | PRN
  Administered 2018-05-05: 4 mg via INTRAVENOUS
  Filled 2018-05-04: qty 2

## 2018-05-04 MED ORDER — VERAPAMIL HCL 2.5 MG/ML IV SOLN
INTRAVENOUS | Status: DC | PRN
  Administered 2018-05-04: 10 mL via INTRA_ARTERIAL

## 2018-05-04 MED ORDER — NITROGLYCERIN 0.4 MG SL SUBL
0.4000 mg | SUBLINGUAL_TABLET | SUBLINGUAL | Status: DC | PRN
  Administered 2018-05-04 (×3): 0.4 mg via SUBLINGUAL
  Filled 2018-05-04: qty 1

## 2018-05-04 MED ORDER — ONDANSETRON HCL 4 MG/2ML IJ SOLN
4.0000 mg | Freq: Four times a day (QID) | INTRAMUSCULAR | Status: DC | PRN
  Administered 2018-05-04: 4 mg via INTRAVENOUS

## 2018-05-04 MED ORDER — PRASUGREL HCL 10 MG PO TABS
ORAL_TABLET | ORAL | Status: DC | PRN
  Administered 2018-05-04: 60 mg via ORAL

## 2018-05-04 MED ORDER — PRASUGREL HCL 10 MG PO TABS
10.0000 mg | ORAL_TABLET | Freq: Every day | ORAL | Status: DC
  Administered 2018-05-05 – 2018-05-08 (×4): 10 mg via ORAL
  Filled 2018-05-04 (×5): qty 1

## 2018-05-04 MED ORDER — MORPHINE SULFATE (PF) 2 MG/ML IV SOLN
2.0000 mg | Freq: Once | INTRAVENOUS | Status: AC
  Administered 2018-05-04: 2 mg via INTRAVENOUS
  Filled 2018-05-04: qty 1

## 2018-05-04 MED ORDER — LABETALOL HCL 5 MG/ML IV SOLN: 10.0000 mg | INTRAVENOUS | Status: AC | PRN

## 2018-05-04 MED ORDER — SODIUM CHLORIDE 0.9% FLUSH: 3.0000 mL | INTRAVENOUS | Status: DC | PRN

## 2018-05-04 MED ORDER — SODIUM CHLORIDE 0.9 % IV SOLN: 250.0000 mL | INTRAVENOUS | Status: DC | PRN

## 2018-05-04 MED ORDER — NITROGLYCERIN IN D5W 200-5 MCG/ML-% IV SOLN
10.0000 ug/min | INTRAVENOUS | Status: DC
  Administered 2018-05-04: 10 ug/min via INTRAVENOUS

## 2018-05-04 MED ORDER — HEPARIN (PORCINE) 25000 UT/250ML-% IV SOLN: 14.0000 [IU]/kg/h | INTRAVENOUS | Status: DC

## 2018-05-04 MED ORDER — SODIUM CHLORIDE 0.9 % IV SOLN
INTRAVENOUS | Status: AC | PRN
  Administered 2018-05-04: 75 mL/h via INTRAVENOUS

## 2018-05-04 MED ORDER — TIROFIBAN (AGGRASTAT) BOLUS VIA INFUSION
25.0000 ug/kg | Freq: Once | INTRAVENOUS | Status: DC
  Filled 2018-05-04: qty 74

## 2018-05-04 MED ORDER — ACETAMINOPHEN 325 MG PO TABS: 650.0000 mg | ORAL_TABLET | ORAL | Status: DC | PRN

## 2018-05-04 MED ORDER — HYDRALAZINE HCL 20 MG/ML IJ SOLN: 5.0000 mg | INTRAMUSCULAR | Status: AC | PRN

## 2018-05-04 MED ORDER — MIDAZOLAM HCL 2 MG/2ML IJ SOLN
INTRAMUSCULAR | Status: DC | PRN
  Administered 2018-05-04 (×4): 1 mg via INTRAVENOUS

## 2018-05-04 MED ORDER — HEPARIN BOLUS VIA INFUSION
4000.0000 [IU] | Freq: Once | INTRAVENOUS | Status: AC
  Administered 2018-05-04: 4000 [IU] via INTRAVENOUS
  Filled 2018-05-04: qty 4000

## 2018-05-04 MED ORDER — ATROPINE SULFATE 1 MG/10ML IJ SOSY
PREFILLED_SYRINGE | INTRAMUSCULAR | Status: DC | PRN
  Administered 2018-05-04: 1 mg via INTRAVENOUS

## 2018-05-04 MED ORDER — HEPARIN (PORCINE) IN NACL 1000-0.9 UT/500ML-% IV SOLN
INTRAVENOUS | Status: DC | PRN
  Administered 2018-05-04 (×2): 500 mL

## 2018-05-04 MED ORDER — ONDANSETRON HCL 4 MG/2ML IJ SOLN
4.0000 mg | Freq: Once | INTRAMUSCULAR | Status: AC
  Administered 2018-05-04: 4 mg via INTRAVENOUS
  Filled 2018-05-04: qty 2

## 2018-05-04 MED ORDER — METOPROLOL TARTRATE 12.5 MG HALF TABLET: 12.5000 mg | ORAL_TABLET | Freq: Two times a day (BID) | ORAL | Status: DC

## 2018-05-04 MED ORDER — NITROGLYCERIN 1 MG/10 ML FOR IR/CATH LAB
INTRA_ARTERIAL | Status: DC | PRN
  Administered 2018-05-04: 200 ug via INTRACORONARY

## 2018-05-04 MED ORDER — SODIUM CHLORIDE 0.9 % IV SOLN
INTRAVENOUS | Status: DC | PRN
  Administered 2018-05-04: 10 mL/h via INTRAVENOUS

## 2018-05-04 MED ORDER — ALPRAZOLAM 0.25 MG PO TABS
0.2500 mg | ORAL_TABLET | Freq: Two times a day (BID) | ORAL | Status: DC | PRN
  Administered 2018-05-04 – 2018-05-06 (×3): 0.25 mg via ORAL
  Filled 2018-05-04 (×4): qty 1

## 2018-05-04 MED ORDER — SODIUM CHLORIDE 0.9 % IV SOLN: INTRAVENOUS | Status: DC | PRN

## 2018-05-04 MED ORDER — ONDANSETRON HCL 4 MG/2ML IJ SOLN
INTRAMUSCULAR | Status: AC
  Filled 2018-05-04: qty 2

## 2018-05-04 MED ORDER — ACETAMINOPHEN 325 MG PO TABS
650.0000 mg | ORAL_TABLET | ORAL | Status: DC | PRN
  Administered 2018-05-04 – 2018-05-06 (×2): 650 mg via ORAL
  Filled 2018-05-04 (×3): qty 2

## 2018-05-04 MED ORDER — SODIUM CHLORIDE 0.9 % IV SOLN
250.0000 mL | INTRAVENOUS | Status: DC | PRN
  Administered 2018-05-04: 10 mL/h via INTRAVENOUS

## 2018-05-04 MED ORDER — NITROGLYCERIN 0.4 MG SL SUBL: 0.4000 mg | SUBLINGUAL_TABLET | SUBLINGUAL | Status: DC | PRN

## 2018-05-04 MED ORDER — PERFLUTREN LIPID MICROSPHERE
INTRAVENOUS | Status: AC
  Administered 2018-05-04: 2 mL
  Filled 2018-05-04: qty 10

## 2018-05-04 MED ORDER — ZOLPIDEM TARTRATE 5 MG PO TABS: 5.0000 mg | ORAL_TABLET | Freq: Every evening | ORAL | Status: DC | PRN

## 2018-05-04 MED ORDER — ASPIRIN 81 MG PO CHEW
324.0000 mg | CHEWABLE_TABLET | Freq: Once | ORAL | Status: AC
  Administered 2018-05-04: 324 mg via ORAL
  Filled 2018-05-04: qty 4

## 2018-05-04 MED ORDER — TIROFIBAN (AGGRASTAT) BOLUS VIA INFUSION
INTRAVENOUS | Status: DC | PRN
  Administered 2018-05-04: 3697.5 ug via INTRAVENOUS

## 2018-05-04 MED ORDER — OXYCODONE HCL 5 MG PO TABS
5.0000 mg | ORAL_TABLET | ORAL | Status: DC | PRN
  Administered 2018-05-04 – 2018-05-05 (×2): 5 mg via ORAL
  Administered 2018-05-06 – 2018-05-08 (×4): 10 mg via ORAL
  Filled 2018-05-04 (×3): qty 2
  Filled 2018-05-04 (×2): qty 1
  Filled 2018-05-04: qty 2

## 2018-05-04 MED ORDER — HEPARIN (PORCINE) 25000 UT/250ML-% IV SOLN
1450.0000 [IU]/h | INTRAVENOUS | Status: DC
  Administered 2018-05-04: 1450 [IU]/h via INTRAVENOUS
  Filled 2018-05-04: qty 250

## 2018-05-04 MED ORDER — SODIUM CHLORIDE 0.9% FLUSH
3.0000 mL | Freq: Two times a day (BID) | INTRAVENOUS | Status: DC
  Administered 2018-05-04 – 2018-05-08 (×7): 3 mL via INTRAVENOUS

## 2018-05-04 MED ORDER — ASPIRIN 81 MG PO CHEW
81.0000 mg | CHEWABLE_TABLET | Freq: Every day | ORAL | Status: DC
  Administered 2018-05-05 – 2018-05-08 (×4): 81 mg via ORAL
  Filled 2018-05-04 (×4): qty 1

## 2018-05-04 MED ORDER — HEPARIN (PORCINE) 25000 UT/250ML-% IV SOLN
1700.0000 [IU]/h | INTRAVENOUS | Status: AC
  Administered 2018-05-05: 1400 [IU]/h via INTRAVENOUS
  Filled 2018-05-04: qty 250

## 2018-05-04 SURGICAL SUPPLY — 24 items
CABLE ADAPT CONN TEMP 6FT (ADAPTER) ×1 IMPLANT
CATH ANGIOJET SPIRO ULTR 135CM (CATHETERS) ×1 IMPLANT
CATH INFINITI 5 FR JL3.5 (CATHETERS) ×1 IMPLANT
CATH INFINITI JR4 5F (CATHETERS) ×1 IMPLANT
CATH S G BIP PACING (CATHETERS) ×1 IMPLANT
CATH VISTA GUIDE 6FR JR4 (CATHETERS) ×1 IMPLANT
CATH VISTA GUIDE 6FR XBRCA (CATHETERS) ×1 IMPLANT
CATH-GARD ARROW CATH SHIELD (MISCELLANEOUS) ×2
DEVICE RAD COMP TR BAND LRG (VASCULAR PRODUCTS) ×1 IMPLANT
GLIDESHEATH SLEND A-KIT 6F 22G (SHEATH) ×1 IMPLANT
GUIDEWIRE INQWIRE 1.5J.035X260 (WIRE) IMPLANT
INQWIRE 1.5J .035X260CM (WIRE) ×2
KIT ENCORE 26 ADVANTAGE (KITS) ×1 IMPLANT
KIT HEART LEFT (KITS) ×2 IMPLANT
PACK CARDIAC CATHETERIZATION (CUSTOM PROCEDURE TRAY) ×2 IMPLANT
PINNACLE LONG 6F 25CM (SHEATH) ×2
SHEATH INTRO PINNACLE 6F 25CM (SHEATH) IMPLANT
SHEATH PROBE COVER 6X72 (BAG) ×1 IMPLANT
SHIELD CATHGARD ARROW (MISCELLANEOUS) IMPLANT
TRANSDUCER W/STOPCOCK (MISCELLANEOUS) ×2 IMPLANT
TUBING CIL FLEX 10 FLL-RA (TUBING) ×2 IMPLANT
WIRE ASAHI PROWATER 180CM (WIRE) ×1 IMPLANT
WIRE EMERALD 3MM-J .035X150CM (WIRE) ×1 IMPLANT
WIRE MINAMO 190 (WIRE) ×1 IMPLANT

## 2018-05-04 NOTE — ED Notes (Signed)
Dr Deretha Emory notified for patient's troponin result of 0.09.

## 2018-05-04 NOTE — Progress Notes (Signed)
R femoral venous sheath removed with sterile technique.  Pressure held for .  Site level 0 before, during and after procedure.  Pt was tolerated removal well.  Pt educated.  Bedrest until 10pm.  Will continue to monitor closely and update as needed.

## 2018-05-04 NOTE — ED Notes (Signed)
Pt vomited x 1 after returning from xray.

## 2018-05-04 NOTE — Progress Notes (Signed)
ANTICOAGULATION CONSULT NOTE  Pharmacy Consult for IV heparin and tirofiban Indication: NSTEMI, Hx VTE/antiphospholipid on Xarelto  Allergies  Allergen Reactions  . Ampicillin Shortness Of Breath and Swelling    Throat swells  . Penicillins Anaphylaxis and Shortness Of Breath    Throat swelling Has patient had a PCN reaction causing immediate rash, facial/tongue/throat swelling, SOB or lightheadedness with hypotension: Yes Has patient had a PCN reaction causing severe rash involving mucus membranes or skin necrosis: No Has patient had a PCN reaction that required hospitalization: No Has patient had a PCN reaction occurring within the last 10 years: Yes If all of the above answers are "NO", then may proceed with Cephalosporin use.   . Strawberry Extract Anaphylaxis, Itching and Swelling  . Cephalexin Hives, Itching and Swelling  . Prednisone Nausea And Vomiting, Rash and Other (See Comments)    Cramping in legs, could barely walk     Patient Measurements: Height: 5\' 9"  (175.3 cm) Weight: (!) 326 lb (147.9 kg) IBW/kg (Calculated) : 66.2 Heparin Dosing Weight: 102 kg  Vital Signs: Temp: 97.5 F (36.4 C) (01/16 0808) Temp Source: Oral (01/16 0808) BP: 108/72 (01/16 1000) Pulse Rate: 65 (01/16 1000)  Labs: Recent Labs    05/04/18 0834 05/04/18 0841  HGB 13.3  --   HCT 39.6  --   PLT 310  --   APTT  --  31  LABPROT  --  12.1  INR  --  0.90  HEPARINUNFRC  --  <0.10*  CREATININE 0.70  --     Estimated Creatinine Clearance: 144.5 mL/min (by C-G formula based on SCr of 0.7 mg/dL).   Medical History: Past Medical History:  Diagnosis Date  . ACS (acute coronary syndrome) (HCC) 05/04/2018  . CHOLELITHIASIS   . Coronary artery disease   . Dermatophytosis of nail   . 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 GERD (gastroesophageal reflux disease)   . Gestational diabetes    "w/all 4  pregnancies"  . Heart murmur   . Hypertension   . Metrorrhagia   . Migraine    "at least twice/month" (05/28/2016)  . Nystagmus 06/30/2016  . OBESITY, MORBID   . OBESITY, NOS   . Pneumonia 11/2014  . POSTURAL LIGHTHEADEDNESS   . Sickle cell trait (HCC)   . STEMI (ST elevation myocardial infarction) (HCC) 11/09/2017   in [place], [place] - see records in Care Everywhere  . SYNCOPE   . Throat pain   . TOBACCO DEPENDENCE   . URI   . Urinary frequency     Medications:  Medications Prior to Admission  Medication Sig Dispense Refill Last Dose  . aspirin EC 81 MG tablet Take 81 mg by mouth daily.    Past Week at Unknown time  . atorvastatin (LIPITOR) 80 MG tablet Take 1 tablet (80 mg total) by mouth at bedtime. (Patient taking differently: Take 80 mg by mouth daily. ) 90 tablet 3 Past Week at Unknown time  . metoprolol tartrate (LOPRESSOR) 25 MG tablet Take 0.5 tablets (12.5 mg total) by mouth at bedtime. (Patient taking differently: Take 12.5 mg by mouth daily. ) 45 tablet 1 Past Week at 0930  . prasugrel (EFFIENT) 10 MG TABS tablet Take 1 tablet (10 mg total) by mouth at bedtime. (Patient taking differently: Take 10 mg by mouth daily. ) 90 tablet 0 Past Week at Unknown time  . rivaroxaban (XARELTO) 20 MG TABS tablet TAKE  1 TABLET BY MOUTH ONCE DAILY WITH SUPPER (Patient taking differently: Take 20 mg by mouth daily. ) 90 tablet 1 Past Week at 0930  . topiramate (TOPAMAX) 50 MG tablet Take 50 mg by mouth daily.   0 Past Week at Unknown time   Scheduled:  . aspirin  81 mg Oral Daily  . atorvastatin  80 mg Oral q1800  . metoprolol tartrate  12.5 mg Oral BID  . ondansetron      . [START ON 05/05/2018] prasugrel  10 mg Oral Daily  . sodium chloride flush  3 mL Intravenous Once  . sodium chloride flush  3 mL Intravenous Q12H  . sodium chloride flush  3 mL Intravenous Q12H   Assessment: 42 YOF presented with chest pain, NSTEMI on 1/16 in the setting of running out of rivaroxaban and  prasugrel for 2-3 days. 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  Pharmacy to restart IV heparin 8 hours after femoral sheath removed.    Prior anticoagulation: Xarelto 20 mg daily, Effient 10 mg daily, and ASA 81 mg daily; last dose > 2 days ago   Goal of Therapy: Heparin level 0.3-0.7 units/ml Monitor platelets by anticoagulation protocol: Yes  Plan:  Follow up time femoral sheath is removed and start heparin 8 hours after  Continue tirofiban 0.15 mcg/kg/min for 24 hours  Daily CBC and HL  Monitor H&H, platelets, and s/sx bleeding   Greer Pickerel Pharmacy Student 05/04/2018, 1:50 PM

## 2018-05-04 NOTE — CV Procedure (Signed)
   Acute coronary syndrome in the setting of lupus erythematosus, thrombophilia due to antiphospholipid antibody syndrome, prior acute coronary syndrome June 2019 in [place] involving the right coronary requiring stent, stent thrombosis in September 2019 treated with restenting and change from Plavix to Effient, and currently with 48 to 72 hours off Xarelto and Effient leading to acute onset of chest pain this 4 AM.  Right radial approach with ultrasound guidance.  Right femoral venous stick for temporary pacemaker with ultrasound guidance after anticoagulation.  Widely patent left coronary system.  Stent thrombosis involving the mid right coronary, copious filling defects noted within the vessel and distal embolus to a large left ventricular branch.  TIMI grade II flow noted in the right coronary.  AngioJet thrombectomy performed with improvement in flow to TIMI-3 and residual 40% distal stenosis either from adherent thrombus or ISR.  Reloaded with Effient.  IV Aggrastat will continue for full 24 hours.  IV heparin after femoral vein sheath is removed.  We will need a decision about whether to restudy or to treat clinically with resumption Xarelto and Effient.  I would lean towards resumption of medical therapy.  Review prior records to determine if she has 1 or 2 stents.  If she has 2 stents a third stent would not be the treatment of choice.

## 2018-05-04 NOTE — Progress Notes (Signed)
ANTICOAGULATION CONSULT NOTE  Pharmacy Consult for IV heparin Indication: NSTEMI, Hx VTE/antiphospholipid on Xarelto  Allergies  Allergen Reactions  . Ampicillin Shortness Of Breath and Swelling    Throat swells  . Penicillins Anaphylaxis and Shortness Of Breath    Throat swelling Has patient had a PCN reaction causing immediate rash, facial/tongue/throat swelling, SOB or lightheadedness with hypotension: Yes Has patient had a PCN reaction causing severe rash involving mucus membranes or skin necrosis: No Has patient had a PCN reaction that required hospitalization: No Has patient had a PCN reaction occurring within the last 10 years: Yes If all of the above answers are "NO", then may proceed with Cephalosporin use.   . Strawberry Extract Anaphylaxis, Itching and Swelling  . Cephalexin Hives, Itching and Swelling  . Prednisone Nausea And Vomiting, Rash and Other (See Comments)    Cramping in legs, could barely walk     Patient Measurements: Height: 5\' 9"  (175.3 cm) Weight: (!) 326 lb (147.9 kg) IBW/kg (Calculated) : 66.2 Heparin Dosing Weight: 102 kg  Vital Signs: Temp: 97.5 F (36.4 C) (01/16 0808) Temp Source: Oral (01/16 0808) BP: 111/63 (01/16 0906) Pulse Rate: 62 (01/16 0906)  Labs: Recent Labs    05/04/18 0834  HGB 13.3  HCT 39.6  PLT 310  CREATININE 0.70    Estimated Creatinine Clearance: 144.5 mL/min (by C-G formula based on SCr of 0.7 mg/dL).   Medical History: Past Medical History:  Diagnosis Date  . CHOLELITHIASIS   . Coronary artery disease   . Dermatophytosis of nail   . 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 GERD (gastroesophageal reflux disease)   . Gestational diabetes    "w/all 4 pregnancies"  . Heart murmur   . Hypertension   . Metrorrhagia   . Migraine    "at least twice/month" (05/28/2016)  . Nystagmus 06/30/2016  . OBESITY, MORBID   . OBESITY, NOS    . Pneumonia 11/2014  . POSTURAL LIGHTHEADEDNESS   . Sickle cell trait (HCC)   . STEMI (ST elevation myocardial infarction) (HCC) 11/09/2017   in [place], [place] - see records in Care Everywhere  . SYNCOPE   . Throat pain   . TOBACCO DEPENDENCE   . URI   . Urinary frequency     Medications:  (Not in a hospital admission)  Scheduled:  . heparin  4,000 Units Intravenous Once  . sodium chloride flush  3 mL Intravenous Once   Assessment: 89 yoF with PMH antiphospholipid antibody with Hx VTE, STEMI s/p PCIs 6 and 4 months ago currently on TOAT with Xarelto, Effient, and ASA presented with CP since 4am today. EKG shows NSTEMI with some subtle ST elevation. Pharmacy to dose IV heparin. Patient reports not taking antithrombotic meds for at least 2 days   Baseline INR, aPTT, HL: pending  Prior anticoagulation: Xarelto 20 mg daily, Effient 10 mg daily, and ASA 81 mg daily; last dose > 2 days ago  Significant events:  Today, 05/04/2018:  CBC: WNL  Trop elevated at 0.09  No bleeding or infusion issues per nursing  Goal of Therapy: Heparin level 0.3-0.7 units/ml Monitor platelets by anticoagulation protocol: Yes  Plan:  Heparin 4000 units IV bolus x 1  Heparin 1450 units/hr IV infusion  Check aPTT 6 hrs after start  Daily CBC and HL, aPTT as needed until HL no longer elevated from DOAC  Monitor for signs of bleeding or thrombosis  Bernadene Person, PharmD, BCPS [phone] 05/04/2018, 9:53 AM

## 2018-05-04 NOTE — Progress Notes (Addendum)
ANTICOAGULATION CONSULT NOTE  Pharmacy Consult for IV heparin and tirofiban Indication: NSTEMI, Hx VTE/antiphospholipid on Xarelto  Allergies  Allergen Reactions  . Ampicillin Shortness Of Breath and Swelling    Throat swells  . Penicillins Anaphylaxis and Shortness Of Breath    Throat swelling Has patient had a PCN reaction causing immediate rash, facial/tongue/throat swelling, SOB or lightheadedness with hypotension: Yes Has patient had a PCN reaction causing severe rash involving mucus membranes or skin necrosis: No Has patient had a PCN reaction that required hospitalization: No Has patient had a PCN reaction occurring within the last 10 years: Yes If all of the above answers are "NO", then may proceed with Cephalosporin use.   . Strawberry Extract Anaphylaxis, Itching and Swelling  . Cephalexin Hives, Itching and Swelling  . Prednisone Nausea And Vomiting, Rash and Other (See Comments)    Cramping in legs, could barely walk     Patient Measurements: Height: 5\' 9"  (175.3 cm) Weight: (!) 326 lb (147.9 kg) IBW/kg (Calculated) : 66.2 Heparin Dosing Weight: 102 kg  Vital Signs: Temp: 97.7 F (36.5 C) (01/16 1730) Temp Source: Oral (01/16 1730) BP: 131/82 (01/16 1730) Pulse Rate: 50 (01/16 1428)  Labs: Recent Labs    05/04/18 0834 05/04/18 0841 05/04/18 1358  HGB 13.3  --   --   HCT 39.6  --   --   PLT 310  --   --   APTT  --  31  --   LABPROT  --  12.1  --   INR  --  0.90  --   HEPARINUNFRC  --  <0.10*  --   CREATININE 0.70  --   --   TROPONINI  --   --  2.32*    Estimated Creatinine Clearance: 144.5 mL/min (by C-G formula based on SCr of 0.7 mg/dL).   Medical History: Past Medical History:  Diagnosis Date  . ACS (acute coronary syndrome) (HCC) 05/04/2018  . CHOLELITHIASIS   . Coronary artery disease   . Dermatophytosis of nail   . 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 GERD (gastroesophageal reflux disease)   . Gestational diabetes    "w/all 4 pregnancies"  . Heart murmur   . Hypertension   . Metrorrhagia   . Migraine    "at least twice/month" (05/28/2016)  . Nystagmus 06/30/2016  . OBESITY, MORBID   . OBESITY, NOS   . Pneumonia 11/2014  . POSTURAL LIGHTHEADEDNESS   . Sickle cell trait (HCC)   . STEMI (ST elevation myocardial infarction) (HCC) 11/09/2017   in [place], [place] - see records in Care Everywhere  . SYNCOPE   . Throat pain   . TOBACCO DEPENDENCE   . URI   . Urinary frequency     Medications:  Medications Prior to Admission  Medication Sig Dispense Refill Last Dose  . aspirin EC 81 MG tablet Take 81 mg by mouth daily.    Past Week at Unknown time  . atorvastatin (LIPITOR) 80 MG tablet Take 1 tablet (80 mg total) by mouth at bedtime. (Patient taking differently: Take 80 mg by mouth daily. ) 90 tablet 3 Past Week at Unknown time  . metoprolol tartrate (LOPRESSOR) 25 MG tablet Take 0.5 tablets (12.5 mg total) by mouth at bedtime. (Patient taking differently: Take 12.5 mg by mouth daily. ) 45 tablet 1 Past Week at 0930  . prasugrel (EFFIENT) 10 MG TABS tablet Take 1  tablet (10 mg total) by mouth at bedtime. (Patient taking differently: Take 10 mg by mouth daily. ) 90 tablet 0 Past Week at Unknown time  . rivaroxaban (XARELTO) 20 MG TABS tablet TAKE 1 TABLET BY MOUTH ONCE DAILY WITH SUPPER (Patient taking differently: Take 20 mg by mouth daily. ) 90 tablet 1 Past Week at 0930  . topiramate (TOPAMAX) 50 MG tablet Take 50 mg by mouth daily.   0 Past Week at Unknown time   Scheduled:  . aspirin  81 mg Oral Daily  . atorvastatin  80 mg Oral q1800  . metoprolol tartrate  12.5 mg Oral BID  . [START ON 05/05/2018] prasugrel  10 mg Oral Daily  . sodium chloride flush  3 mL Intravenous Once  . sodium chloride flush  3 mL Intravenous Q12H  . sodium chloride flush  3 mL Intravenous Q12H   Assessment: 42 YOF presented with chest pain, NSTEMI on  1/16 in the setting of running out of rivaroxaban and prasugrel for 2-3 days. 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  Pharmacy to restart IV heparin 8 hours after femoral sheath removed. This will be early tomorrow morning d/t late removal of sheath.    Prior anticoagulation: Xarelto 20 mg daily, Effient 10 mg daily, and ASA 81 mg daily; last dose > 2 days ago   Goal of Therapy: Heparin level 0.3-0.5 units/ml while on tirofiban Monitor platelets by anticoagulation protocol: Yes  Plan:  Restart heparin at 0200 1/17  Continue tirofiban 0.15 mcg/kg/min for 24 hours  Daily CBC and HL  Monitor H&H, platelets, and s/sx bleeding  Sheppard Coil PharmD., BCPS Clinical Pharmacist 05/04/2018 5:52 PM

## 2018-05-04 NOTE — Progress Notes (Signed)
CRITICAL VALUE ALERT  Critical Value:  Trip 2.32  Date & Time Notied:  1500, 05/04/2018  Provider Notified: Katrinka Blazing md  Orders Received/Actions taken: no new orders at this time.

## 2018-05-04 NOTE — Progress Notes (Addendum)
  Echocardiogram 2D Echocardiogram has been performed with Definity.  Gerda Diss 05/04/2018, 4:11 PM

## 2018-05-04 NOTE — ED Triage Notes (Signed)
Patient c/o upper chest pain, nausea, headache x 4 days, and SOB since 0400 today. Patient reports an MI history x 2. Patient states she took Tylenol ES x 3 tab at 0600 today.

## 2018-05-04 NOTE — Progress Notes (Signed)
PCP Social Visit  I am Angelica Brown's PCP at the [facility] and know her well, as I take care of her and her two daughters. Stopped by her room in the ICU to visit and spoke with [name], and met her mother and another family member. She is in reasonably good spirits given today's events. She shared that the plan is a repeat catheterization tomorrow.  She had run out of her effient and xarelto two days ago and could not get refills due to issues with her insurance. I advised [name] that if she has challenges obtaining her xarelto in the future, that she can call our office as we usually have samples on hand that could tide her over until she is able to get her medication.   I sincerely appreciate the cardiology team's prompt and excellent care. Please let me know if I can be helpful during this hospitalization.   Chrisandra Netters, MD [place]  Pager [phone]

## 2018-05-04 NOTE — ED Provider Notes (Addendum)
[facility]-EMERGENCY DEPT Provider Note   CSN: [id number] Arrival date & time: 05/04/18  0758     History   Chief Complaint Chief Complaint  Patient presents with  . Chest Pain  . Nausea  . Headache  . Shortness of Breath    HPI Angelica Brown is a 43 y.o. female.  Patient with known cardiac history.  Status post stents in July 2019 and September 2019 in the [place] area.  Patient just recently came back to this area is been plugged in with family medicine.  And has a referral to cardiology but has not been seen by them yet.  Patient is on Eliquis.  Patient with acute onset of chest pain at 4 in the morning substernal nonradiating.  Associated with some shortness of breath and some nausea and one episode of vomiting.  Patient did not take any of her aspirin today.  Patient did not take any nitro she does not have nitro at home.     Past Medical History:  Diagnosis Date  . CHOLELITHIASIS   . Coronary artery disease   . Dermatophytosis of nail   . 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 GERD (gastroesophageal reflux disease)   . Gestational diabetes    "w/all 4 pregnancies"  . Heart murmur   . Hypertension   . Metrorrhagia   . Migraine    "at least twice/month" (05/28/2016)  . Nystagmus 06/30/2016  . OBESITY, MORBID   . OBESITY, NOS   . Pneumonia 11/2014  . POSTURAL LIGHTHEADEDNESS   . Sickle cell trait (HCC)   . STEMI (ST elevation myocardial infarction) (HCC) 11/09/2017   in [place], [place] - see records in Care Everywhere  . SYNCOPE   . Throat pain   . TOBACCO DEPENDENCE   . URI   . Urinary frequency     Patient Active Problem List   Diagnosis Date Noted  . SLE (systemic lupus erythematosus related syndrome) (HCC) 04/20/2018  . Bradycardia 04/20/2018  . History of MI (myocardial infarction) 04/20/2018  . Antiphospholipid antibody syndrome (HCC)  11/01/2016  . Chronic diarrhea 10/27/2016  . Seborrheic dermatitis 10/27/2016  . Nystagmus 06/30/2016  . Chronic diastolic heart failure (HCC) 06/13/2016  . Acute stress reaction 06/09/2016  . DVT of upper extremity (deep vein thrombosis) (HCC) 06/09/2016  . Superficial venous thrombosis of right upper extremity   . Sinus bradycardia   . Mastoiditis of right side 05/29/2016  . 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 Papilledema of both eyes   . Bacterial meningitis   . Headache 05/26/2016  . Onychomycosis 04/16/2013  . Family history of breast cancer 01/17/2013  . Sickle cell trait (HCC)   . Vertigo 08/29/2008  . OBESITY, MORBID 07/10/2008  . SYNCOPE 10/24/2006  . CHOLELITHIASIS 07/13/2006  . TOBACCO DEPENDENCE 06/16/2006  . METRORRHAGIA 06/16/2006    Past Surgical History:  Procedure Laterality Date  . BARTHOLIN GLAND CYST EXCISION Bilateral 2004  . EXCISIONAL HEMORRHOIDECTOMY  2006   "removed a blood clot from one"  . TUBAL LIGATION  2007  . WISDOM TOOTH EXTRACTION  1998     OB History    Gravida  5   Para  5   Term  4   Preterm  1   AB      Living  4     SAB      TAB      Ectopic  Multiple  1   Live Births               Home Medications    Prior to Admission medications   Medication Sig Start Date End Date Taking? Authorizing Provider  aspirin EC 81 MG tablet Take 81 mg by mouth at bedtime.    [provider]  atorvastatin (LIPITOR) 80 MG tablet Take 1 tablet (80 mg total) by mouth at bedtime. 04/20/18   Latrelle DodrillMcIntyre, Brittany J, MD  metoprolol tartrate (LOPRESSOR) 25 MG tablet Take 0.5 tablets (12.5 mg total) by mouth at bedtime. 04/20/18   Latrelle DodrillMcIntyre, Brittany J, MD  prasugrel (EFFIENT) 10 MG TABS tablet Take 1 tablet (10 mg total) by mouth at bedtime. 04/20/18   Latrelle DodrillMcIntyre, Brittany J, MD  rivaroxaban (XARELTO) 20 MG TABS tablet TAKE 1 TABLET BY MOUTH ONCE DAILY WITH SUPPER Patient taking differently: Take 20 mg by mouth daily with  supper.  04/20/18   Latrelle DodrillMcIntyre, Brittany J, MD  topiramate (TOPAMAX) 50 MG tablet Take 50 mg by mouth at bedtime. 02/17/18   [provider]    Family History Family History  Problem Relation Age of Onset  . Breast cancer Mother   . Cancer Father        MELANOMA  . Diabetes Father   . Diabetes Paternal Grandmother   . Diabetes Paternal Grandfather     Social History Social History   Tobacco Use  . Smoking status: Current Every Day Smoker    Packs/day: 0.15    Years: 18.00    Pack years: 2.70    Types: Cigarettes  . Smokeless tobacco: Never Used  Substance Use Topics  . Alcohol use: No  . Drug use: No     Allergies   Ampicillin; Penicillins; Strawberry extract; Cephalexin; and Prednisone   Review of Systems Review of Systems  Constitutional: Negative for chills and fever.  HENT: Negative for congestion, rhinorrhea and sore throat.   Eyes: Negative for visual disturbance.  Respiratory: Positive for shortness of breath. Negative for cough.   Cardiovascular: Positive for chest pain. Negative for palpitations and leg swelling.  Gastrointestinal: Positive for nausea and vomiting. Negative for abdominal pain and diarrhea.  Genitourinary: Negative for dysuria.  Musculoskeletal: Negative for back pain and neck pain.  Skin: Negative for rash.  Neurological: Negative for dizziness, syncope, light-headedness and headaches.  Hematological: Does not bruise/bleed easily.  Psychiatric/Behavioral: Negative for confusion.     Physical Exam Updated Vital Signs BP 123/63 (BP Location: Left Arm) Comment: 5 min post 2nd NTG  Pulse (!) 58   Temp (!) 97.5 F (36.4 C) (Oral)   Resp (!) 22   Ht 1.753 m (5\' 9" )   Wt (!) 147.9 kg   LMP 04/13/2018 (Exact Date)   SpO2 98%   BMI 48.14 kg/m   Physical Exam Vitals signs and nursing note reviewed.  Constitutional:      General: She is not in acute distress.    Appearance: She is well-developed.  HENT:     Head: Normocephalic  and atraumatic.     Mouth/Throat:     Mouth: Mucous membranes are moist.  Eyes:     Conjunctiva/sclera: Conjunctivae normal.  Neck:     Musculoskeletal: Normal range of motion and neck supple.  Cardiovascular:     Rate and Rhythm: Regular rhythm. Bradycardia present.     Heart sounds: No murmur.  Pulmonary:     Effort: Pulmonary effort is normal. No respiratory distress.     Breath  sounds: Normal breath sounds.  Chest:     Chest wall: No tenderness.  Abdominal:     Palpations: Abdomen is soft.     Tenderness: There is no abdominal tenderness.  Musculoskeletal:        General: No swelling.  Skin:    General: Skin is warm and dry.  Neurological:     General: No focal deficit present.     Mental Status: She is alert and oriented to person, place, and time.      ED Treatments / Results  Labs (all labs ordered are listed, but only abnormal results are displayed) Labs Reviewed  BASIC METABOLIC PANEL - Abnormal; Notable for the following components:      Result Value   Glucose, Bld 115 (*)    All other components within normal limits  CBC - Abnormal; Notable for the following components:   WBC 13.4 (*)    All other components within normal limits  I-STAT TROPONIN, ED - Abnormal; Notable for the following components:   Troponin i, poc 0.09 (*)    All other components within normal limits  I-STAT BETA HCG BLOOD, ED ([facility], [facility], [facility] ONLY)    EKG EKG Interpretation  Date/Time:  Thursday May 04 2018 08:56:53 EST Ventricular Rate:  55 PR Interval:    QRS Duration: 76 QT Interval:  441 QTC Calculation: 422 R Axis:   17 Text Interpretation:  Sinus rhythm Abnormal R-wave progression, early transition Minimal ST elevation, inferior leads New since previous tracing in Nov 2019 Confirmed by Vanetta Mulders [phone]) on 05/04/2018 9:03:54 AM   Radiology Dg Chest 2 View  Result Date: 05/04/2018 CLINICAL DATA:  Upper chest pain, nausea EXAM: CHEST - 2 VIEW COMPARISON:  03/10/2018  FINDINGS: Heart is borderline in size. No confluent airspace opacities or effusions. No acute bony abnormality. IMPRESSION: Borderline heart size.  No acute findings. Electronically Signed   By: Charlett Nose M.D.   On: 05/04/2018 08:24    Procedures Procedures (including critical care time)  CRITICAL CARE Performed by: Vanetta Mulders Total critical care time: 30 minutes Critical care time was exclusive of separately billable procedures and treating other patients. Critical care was necessary to treat or prevent imminent or life-threatening deterioration. Critical care was time spent personally by me on the following activities: development of treatment plan with patient and/or surrogate as well as nursing, discussions with consultants, evaluation of patient's response to treatment, examination of patient, obtaining history from patient or surrogate, ordering and performing treatments and interventions, ordering and review of laboratory studies, ordering and review of radiographic studies, pulse oximetry and re-evaluation of patient's condition.   Medications Ordered in ED Medications  sodium chloride flush (NS) 0.9 % injection 3 mL (3 mLs Intravenous Not Given 05/04/18 0843)  nitroGLYCERIN (NITROSTAT) SL tablet 0.4 mg (0.4 mg Sublingual Given 05/04/18 0858)  morphine 2 MG/ML injection 2 mg (has no administration in time range)  aspirin chewable tablet 324 mg (324 mg Oral Given 05/04/18 0841)  ondansetron (ZOFRAN) injection 4 mg (4 mg Intravenous Given 05/04/18 0857)     Initial Impression / Assessment and Plan / ED Course  I have reviewed the triage vital signs and the nursing notes.  Pertinent labs & imaging results that were available during my care of the patient were reviewed by me and considered in my medical decision making (see chart for details).    Patient with onset of chest pain at 4 in the morning.  Patient with known history in July  of September with non-STEMI is requiring  stents.  This was in the [place] area.  Patient is on the anticoagulation Eliquis.  Patient also on a beta-blocker.  Patient states the pain is currently 8 out of 10.  EKG has some subtle ST segment elevation inferiorly compared to November 2019.  We have done 2 EKGs here today both showing the same.  I will discuss with cardiology.  Appears that this is a non-STEMI.  Patient given aspirin nitroglycerin and some morphine.   Final Clinical Impressions(s) / ED Diagnoses   Final diagnoses:  None    ED Discharge Orders    None       Vanetta Mulders, MD 05/04/18 0911  Addendum:  Patient now tells me she has not been on any of her medications at all for 2 days.  So therefore she is probably not and anticoagulated properly.  Will start heparin because of the concern of the non-STEMI or subtle ST segment elevation inferiorly.  Call into cardiology to have them review the EKG.  Discussed with Garnette Scheuermann from cardiology.  They will see patient.  He agrees with starting heparin.  Also his review the EKG subtle changes inferiorly and some T wave changes.  But not an official STEMI.    Vanetta Mulders, MD 05/04/18 [id number]    Vanetta Mulders, MD 05/04/18 [phone]

## 2018-05-04 NOTE — Progress Notes (Signed)
Received report from Shearon Balo RN at 1800. Agree with previous assessment. Pt vital signs stable with no new complaints or changes at this time. On bedrest till 10pm due to sheath pulled earlier this shift. Family at bedside. Will continue to monitor.

## 2018-05-04 NOTE — H&P (Addendum)
The patient has been seen in conjunction with Rosaria Ferries, PA-C. All aspects of care have been considered and discussed. The patient has been personally interviewed, examined, and all clinical data has been reviewed.   The patient was seen in the emergency room, and reviewed all the trip to the Cath Lab, and examined in the Cath Lab.  Ongoing chest discomfort.  She appears relatively comfortable but rates the pain as 10/10 in intensity and identical to her prior acute coronary syndrome episodes.  History of stent in July and repeat stenting in September.  Works for the [employer].  Obese, stable hemodynamics, heart rate 60.  No gallop or murmur is heard.  Lying flat.  Lungs clear.  2+ bilateral radial pulses.  2+ bilateral femoral pulses.  EKG reveals subtle ST elevation in lead III and aVF.  Does not reach STEMI criteria.  Acute coronary syndrome in a patient with prior history of stent thrombosis after acute coronary syndrome 2 months earlier.  Felt to have Plavix resistance.  Has antiphospholipid antibody syndrome which causes thrombophilia.  She has been out of her medications for at least 48 hours.  Urgent catheterization to define anatomy and help guide therapy  Critical care time 33 minutes   Cardiology History and Physical:   Patient ID: Angelica Brown; [id number]; 08/19/1975   Admit date: 05/04/2018 Date of Consult: 05/04/2018  Primary Care Provider: Leeanne Rio, MD Primary Cardiologist: No primary care provider on file. Dr Corbin Ade Husa 01/10/2018 Primary Electrophysiologist:  None   Patient Profile:   Angelica Brown is a 43 y.o. 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  History of Present Illness:   Angelica Brown has been out of her Effient and her Xarelto for at least 2 days.  She is compliant with her medications, but ran out and was not able to fill them due to insurance issues.  She stated she could not afford to pay out-of-pocket for the medications.  She has had a headache for the past 4 days, but otherwise asymptomatic.  She was asymptomatic until 4:06 AM today.  She was awakened by 6/10 chest pain, her usual angina.  It was associated with shortness of breath and some nausea, but no vomiting.    She thought maybe it was indigestion, did not have nitro to take.  She walked around and took 3 Tylenol, and was able to sleep a little bit more.  However, she was again awakened by chest pain and decided to come to the emergency room.  In the ER, she has received aspirin 324 mg, sublingual nitroglycerin x3 and 2 mg of morphine.  Her chest pain is currently a 10/10.  Her usual angina.  She is very worried about this.  She had clots "all over her brain" in 2018 as well as DVT and has been on Xarelto since then.  She has not had the Xarelto in 2 days or more.  She is still taking the atorvastatin, aspirin, and topiramate.   Past Medical History:  Diagnosis Date  . CHOLELITHIASIS   . Coronary artery disease   . Dermatophytosis of  nail   . 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 GERD (gastroesophageal reflux disease)   . Gestational diabetes    "w/all 4 pregnancies"  . Heart murmur   . Hypertension   . Metrorrhagia   . Migraine    "at least twice/month" (05/28/2016)  . NSTEMI (non-ST elevated myocardial infarction) ([place]) 05/04/2018  . Nystagmus 06/30/2016  . OBESITY, MORBID   . OBESITY, NOS   . Pneumonia 11/2014  . POSTURAL  LIGHTHEADEDNESS   . Sickle cell trait ([place])   . STEMI (ST elevation myocardial infarction) ([place]) 11/09/2017   in [place], [place] - see records in [place]  . SYNCOPE   . Throat pain   . TOBACCO DEPENDENCE   . URI   . Urinary frequency     Past Surgical History:  Procedure Laterality Date  . BARTHOLIN GLAND CYST EXCISION Bilateral 2004  . EXCISIONAL HEMORRHOIDECTOMY  2006   "removed a blood clot from one"  . TUBAL LIGATION  2007  . [place]     Prior to Admission medications   Medication Sig Start Date End Date Taking? Authorizing Provider  aspirin EC 81 MG tablet Take 81 mg by mouth daily.    Yes [provider]  atorvastatin (LIPITOR) 80 MG tablet Take 1 tablet (80 mg total) by mouth at bedtime. Patient taking differently: Take 80 mg by mouth daily.  04/20/18  Yes Leeanne Rio, MD  metoprolol tartrate (LOPRESSOR) 25 MG tablet Take 0.5 tablets (12.5 mg total) by mouth at bedtime. Patient taking differently: Take 12.5 mg by mouth daily.  04/20/18  Yes Leeanne Rio, MD  prasugrel (EFFIENT) 10 MG TABS tablet Take 1 tablet (10 mg total) by mouth at bedtime. Patient taking differently: Take 10 mg by mouth daily.  04/20/18  Yes Leeanne Rio, MD  rivaroxaban (XARELTO) 20 MG TABS tablet TAKE 1 TABLET BY MOUTH ONCE DAILY WITH SUPPER Patient taking differently: Take 20 mg by mouth daily.  04/20/18  Yes Leeanne Rio, MD  topiramate (TOPAMAX) 50 MG tablet Take 50 mg by mouth daily.  02/17/18  Yes [provider]    Inpatient Medications: Scheduled Meds: . sodium chloride flush  3 mL Intravenous Once   Continuous Infusions: . sodium chloride    . heparin 1,450 Units/hr (05/04/18 0954)  . nitroGLYCERIN 10 mcg/min (05/04/18 1013)   PRN Meds: sodium chloride, nitroGLYCERIN  Allergies:    Allergies  Allergen Reactions  . Ampicillin Shortness Of Breath and Swelling    Throat swells  . Penicillins Anaphylaxis and  Shortness Of Breath    Throat swelling Has patient had a PCN reaction causing immediate rash, facial/tongue/throat swelling, SOB or lightheadedness with hypotension: Yes Has patient had a PCN reaction causing severe rash involving mucus membranes or skin necrosis: No Has patient had a PCN reaction that required hospitalization: No Has patient had a PCN reaction occurring within the last 10 years: Yes If all of the above answers are "NO", then may proceed with Cephalosporin use.   . Strawberry Extract Anaphylaxis, Itching and Swelling  . Cephalexin Hives, Itching and Swelling  . Prednisone Nausea And Vomiting, Rash and Other (See Comments)    Cramping in legs, could barely walk     Social History:   Social History   Socioeconomic History  . Marital status: Divorced    Spouse name: Not on file  . Number of  children: Not on file  . Years of education: Not on file  . Highest education level: Not on file  Occupational History  . Not on file  Social Needs  . Financial resource strain: Not on file  . Food insecurity:    Worry: Not on file    Inability: Not on file  . Transportation needs:    Medical: Not on file    Non-medical: Not on file  Tobacco Use  . Smoking status: Current Every Day Smoker    Packs/day: 0.15    Years: 18.00    Pack years: 2.70    Types: Cigarettes  . Smokeless tobacco: Never Used  Substance and Sexual Activity  . Alcohol use: No  . Drug use: No  . Sexual activity: Yes    Birth control/protection: Surgical  Lifestyle  . Physical activity:    Days per week: Not on file    Minutes per session: Not on file  . Stress: Not on file  Relationships  . Social connections:    Talks on phone: Not on file    Gets together: Not on file    Attends religious service: Not on file    Active member of club or organization: Not on file    Attends meetings of clubs or organizations: Not on file    Relationship status: Not on file  . Intimate partner violence:     Fear of current or ex partner: Not on file    Emotionally abused: Not on file    Physically abused: Not on file    Forced sexual activity: Not on file  Other Topics Concern  . Not on file  Social History Narrative   Sarina Ser - 2003    Terrence Dupont - 2008   Live with Grandmom     Family History:   Family History  Problem Relation Age of Onset  . Breast cancer Mother   . Cancer Father        MELANOMA  . Diabetes Father   . Diabetes Paternal Grandmother   . Diabetes Paternal Grandfather    Family Status:  Family Status  Relation Name Status  . Mother  Alive  . Father  Deceased  . PGM  (Not Specified)  . PGF  (Not Specified)    ROS:  Please see the history of present illness.  All other ROS reviewed and negative.     Physical Exam/Data:   Vitals:   05/04/18 0854 05/04/18 0906 05/04/18 0930 05/04/18 1000  BP: 123/63 111/63 106/63 108/72  Pulse:  62 (!) 44 65  Resp:  (!) 22 (!) 22 18  Temp:      TempSrc:      SpO2:  99% 99% 99%  Weight:      Height:       No intake or output data in the 24 hours ending 05/04/18 1014 Filed Weights   05/04/18 0805  Weight: (!) 147.9 kg   Body mass index is 48.14 kg/m.  General:  Well nourished, well developed, female in acute distress HEENT: normal Lymph: no adenopathy Neck: no JVD seen, difficult to assess secondary to body habitus Endocrine:  No thryomegaly Vascular: No carotid bruits; 4/4 extremity pulses 2+, without bruits  Cardiac:  normal S1, S2; RRR; no murmur  Lungs:  clear to auscultation bilaterally, no wheezing, rhonchi or rales  Abd: soft, nontender, no hepatomegaly  Ext: no edema Musculoskeletal:  No deformities, BUE and BLE strength normal and equal Skin: warm and dry  Neuro:  CNs 2-12 intact, no focal abnormalities noted Psych:  Normal affect   EKG:  The EKG was personally reviewed and demonstrates: Sinus bradycardia, heart rate 55, minimal ST elevation in inferior leads with biphasic T waves, different  from ECG dated 03/10/2018  Telemetry:  Telemetry was personally reviewed and demonstrates: Sinus rhythm, sinus bradycardia  Relevant CV Studies:  ECHO: 11/11/2017 in [place]  The study was diagnostic quality.  Rhythm: sinus rhythm.  Left ventricular systolic function is normal (LVEF 60%) though there is  hypokinesis of the inferior wall from base to apex.  There are no significant valvular abnormalities.  No prior study is available for comparison.  CATH: 12/28/2017 in [place] Access:  1. Right radial artery, [id number] hydrophilic sheath.    Procedure(s):  1. Conscious sedation administration and monitoring 2. Selective coronary angiography 3. Left heart catheterization  4. PTCA/DES placement [RCA] 5. Aspiration thrombectomy, RCA 6. OCT imaging, RCA  Brief Clinical History: 43 y.o. year-old female with a history of CAD and hypercoaguability due to  lupus anticoagulant. She initially presented with RCA STEMI in 10/2017 and  underwent PCI with BMS at that time. She received a month of triple  therapy followed by transition to clopidogrel rivaroxaban. She is  presenting with acute onset chest pain and found to have troponin >10.   Angina status: _0  No angina _1  CCS I _2  CCS II _3  CCS III _4  CCS IV Anti-Anginal Therapy: _5  Beta-Blocker _6  Calcium Channel Blocker _7   Ranolazine _8  Long acting nitrate _9  Other Prior CABG: _10  No Prior CABG _11  Prior CABG Non-invasive test results: _12  No Non-invasive performed _13  Low risk stress test findings (cardiac mortality <1% / year) _14  Intermediate risk stress test findings (cardiac mortality 1-3%/year) _15  High risk stress test findings (cardiac mortality > 3% / year) _16  Equivocal stress results  Procedural Details: 1. Access: Right radial, [id number] hydrophilic sheath 2. Diagnostic catheters:    [Right] 43F AR-1 guide    [Left] 43F JL-3.5   [LHC] 43F JR4 3. Hemostasis: hemoband, 12cc  Procedure Diagnostic  Angiography: After informed consent was obtained, the patient was transported in the  fasting state to the cardiac cath lab where they were prepped and draped  according to standard sterile technique. Right radial access was obtained  with a 20g micropuncture needle and modified Seldinger technique. A  [id number] hydrophilic sheath was placed and NTG 243mg and Verapamil 2.517m IA were administered. The right coronary artery was engaged and selective  right coronary angiography was performed. The left coronary artery was  engaged and selective left coronary artery angiography was performed. A  catheter was positioned in the left ventricle and LV pressure was recorded  followed by an aortic valve pull-back.   Coronary Intervention: After review of the angiographic images, the decision was made to  intervene on the RCA. Serial ACTs were monitored and maintained  250-300sec. Tirofiban double bolus was administered. A 43F AR-1 guide  catheter was used to engage the RCA. A 0.014 runthrough wire was advanced  into the distal PDA. A Pronto aspiration thrombectomy catheter was  advanced x2. Large thrombus burden was aspirated. Next, a 0.014  dragonfly OCT catheter was advanced and an OCT imaging run was attempted,  however the images were severely limited due to large thrombus burden. A  Synergy [id number] was positioned to cover the entire pre-existing stent  and the thrombus. The stent was deployed at 16 atm. Repeat OCT imaging  demonstrated a deployed stent with mal-apposition of  the proximal and  distal edge and large volume of thrombus pinned between the stent layes.  An elunir 5.0x31m [place] balloon was used to post dilate the stent at 20atm  (5.238m.   Final angiography was performed. The guide was removed. The sheath was  removed. Hemostasis was achieved using mechanical compression and a TR  band.  Results of Intervention (RCA): Pre: 100% stenosis and TIMI 0  flow Post: 0% stenosis and TIMI 3 flow  Complications: none Radiation: 1577 mGy Conscious sedation: Conscious sedation was administered by qualified  nursing personnel under continuous hemodynamic monitoring, starting at  0934 and ending at 0948.  TECHNICAL FACTORS Sedation: 4 mg IV Versed, 200 mcg IV Fentanyl Medications: 12000 units IV Heparin, 20027mNTG, Tirofiban 6750m61m Ticagrelor 180mg4mtrast: 190 cc  Impression: 1. NSTEMI 2. Acute stent thrombosis 3. Hypercoaguable state  Plan:  1. Routine post radial care 2. Triple therapy reduction strategies are reasonable to reduce the risk  of bleeding in patients. However, given her highly thrombogenic  underlying pro-coaguable state and her clopidogrel failure - would plan  for 1 year triple therapy with ASA, ticagrelor (prasugrel if dyspnea or  insurance), and anticoagulation with DOAC. 3. TTE prior to discharge   Laboratory Data:  Chemistry Recent Labs  Lab 05/04/18 0834  NA 140  K 3.5  CL 106  CO2 23  GLUCOSE 115*  BUN 11  CREATININE 0.70  CALCIUM 9.0  GFRNONAA >60  GFRAA >60  ANIONGAP 11    Lab Results  Component Value Date   ALT 25 12/02/2015   AST 20 12/02/2015   ALKPHOS 66 12/02/2015   BILITOT 0.5 12/02/2015   Hematology Recent Labs  Lab 05/04/18 0834  WBC 13.4*  RBC 4.74  HGB 13.3  HCT 39.6  MCV 83.5  MCH 28.1  MCHC 33.6  RDW 14.6  PLT 310   Cardiac EnzymesNo results for input(s): TROPONINI in the last 168 hours.  Recent Labs  Lab 05/04/18 0841  TROPIPOC 0.09*    TSH:  Lab Results  Component Value Date   TSH 1.572 05/02/2015   Lipids: Lab Results  Component Value Date   CHOL 175 05/02/2015   HDL 49 01/17/2013   LDLCALC 112 (H) 01/17/2013   TRIG 75 01/17/2013   CHOLHDL 3.6 01/17/2013   HgbA1c: Lab Results  Component Value Date   HGBA1C 5.7 12/02/2015    Radiology/Studies:  Dg Chest 2 View  Result Date: 05/04/2018 CLINICAL DATA:  Upper chest pain, nausea  EXAM: CHEST - 2 VIEW COMPARISON:  03/10/2018 FINDINGS: Heart is borderline in size. No confluent airspace opacities or effusions. No acute bony abnormality. IMPRESSION: Borderline heart size.  No acute findings. Electronically Signed   By: KevinRolm Baptise   On: 05/04/2018 08:24    Assessment and Plan:   Principal Problem: 1.  ACS : - ECG and patient history reviewed with Dr. McAlhAngelena Formr ECG has some subtle changes in pseudonormalization of inferior T waves. - The ECG changes, in the setting of Hx thrombotic occlusion x 2 of her RCA, being out of her Effient, and with ongoing pain, with troponin elevation>> ACS with urgent cath, treat as a STEMI. -Continue ASA high-dose statin.  No beta-blocker due to resting bradycardia with a heart rate in the 40s at times.  Otherwise, screen for cardiac risk factor control, use heparin for now and restart Xarelto once the need for invasive procedures is ruled out. D/c tobacco use. Active Problems:   Chronic diastolic  heart failure (HCC)   Antiphospholipid antibody syndrome ([place])   For questions or updates, please contact [place] Please consult [email or web address] for contact info under Cardiology/STEMI.   SignedRosaria Ferries, PA-C  05/04/2018 10:14 AM

## 2018-05-05 DIAGNOSIS — I255 Ischemic cardiomyopathy: Secondary | ICD-10-CM

## 2018-05-05 DIAGNOSIS — I2111 ST elevation (STEMI) myocardial infarction involving right coronary artery: Secondary | ICD-10-CM

## 2018-05-05 LAB — CBC
HCT: 34 % — ABNORMAL LOW (ref 36.0–46.0)
HCT: 33.8 % — ABNORMAL LOW (ref 36.0–46.0)
Hemoglobin: 12 g/dL (ref 12.0–15.0)
Hemoglobin: 11.5 g/dL — ABNORMAL LOW (ref 12.0–15.0)
MCH: 28.5 pg (ref 26.0–34.0)
MCH: 27.6 pg (ref 26.0–34.0)
MCHC: 35.3 g/dL (ref 30.0–36.0)
MCHC: 34 g/dL (ref 30.0–36.0)
MCV: 80.8 fL (ref 80.0–100.0)
MCV: 81.1 fL (ref 80.0–100.0)
Platelets: 285 10*3/uL (ref 150–400)
Platelets: 253 10*3/uL (ref 150–400)
RBC: 4.21 MIL/uL (ref 3.87–5.11)
RBC: 4.17 MIL/uL (ref 3.87–5.11)
RDW: 14.2 % (ref 11.5–15.5)
RDW: 14.2 % (ref 11.5–15.5)
WBC: 15.1 10*3/uL — ABNORMAL HIGH (ref 4.0–10.5)
WBC: 14.2 10*3/uL — ABNORMAL HIGH (ref 4.0–10.5)
nRBC: 0 % (ref 0.0–0.2)
nRBC: 0 % (ref 0.0–0.2)

## 2018-05-05 LAB — COMPREHENSIVE METABOLIC PANEL
ALT: 18 U/L (ref 0–44)
AST: 48 U/L — ABNORMAL HIGH (ref 15–41)
Albumin: 3.1 g/dL — ABNORMAL LOW (ref 3.5–5.0)
Alkaline Phosphatase: 53 U/L (ref 38–126)
Anion gap: 7 (ref 5–15)
BUN: 7 mg/dL (ref 6–20)
CO2: 25 mmol/L (ref 22–32)
CREATININE: 0.79 mg/dL (ref 0.44–1.00)
Calcium: 8.5 mg/dL — ABNORMAL LOW (ref 8.9–10.3)
Chloride: 107 mmol/L (ref 98–111)
GFR calc non Af Amer: >60 mL/min (ref >60)
Glucose, Bld: 112 mg/dL — ABNORMAL HIGH (ref 70–99)
Potassium: 3.4 mmol/L — ABNORMAL LOW (ref 3.5–5.1)
Sodium: 139 mmol/L (ref 135–145)
Total Bilirubin: 0.9 mg/dL (ref 0.3–1.2)
Total Protein: 6.3 g/dL — ABNORMAL LOW (ref 6.5–8.1)

## 2018-05-05 LAB — HEPARIN LEVEL (UNFRACTIONATED): Heparin Unfractionated: <0.1 IU/mL — ABNORMAL LOW (ref 0.30–0.70)

## 2018-05-05 LAB — LIPID PANEL
Cholesterol: 135 mg/dL (ref 0–200)
HDL: 40 mg/dL — ABNORMAL LOW (ref >40)
LDL Cholesterol: 78 mg/dL (ref 0–99)
Total CHOL/HDL Ratio: 3.4 RATIO
Triglycerides: 85 mg/dL (ref <150)
VLDL: 17 mg/dL (ref 0–40)

## 2018-05-05 LAB — TROPONIN I: Troponin I: 11.59 ng/mL (ref <0.03)

## 2018-05-05 LAB — HIV ANTIBODY (ROUTINE TESTING W REFLEX): HIV SCREEN 4TH GENERATION: NONREACTIVE

## 2018-05-05 MED ORDER — RIVAROXABAN 20 MG PO TABS
20.0000 mg | ORAL_TABLET | Freq: Every day | ORAL | Status: DC
  Administered 2018-05-05 – 2018-05-07 (×3): 20 mg via ORAL
  Filled 2018-05-05 (×3): qty 1

## 2018-05-05 MED ORDER — TOPIRAMATE 25 MG PO TABS
50.0000 mg | ORAL_TABLET | Freq: Every day | ORAL | Status: DC
  Administered 2018-05-05 – 2018-05-08 (×4): 50 mg via ORAL
  Filled 2018-05-05 (×5): qty 2

## 2018-05-05 MED ORDER — TOPIRAMATE 50 MG PO TABS: 50.0000 mg | ORAL_TABLET | Freq: Every day | ORAL | 0 refills | Status: DC

## 2018-05-05 MED ORDER — PRASUGREL HCL 10 MG PO TABS: 10.0000 mg | ORAL_TABLET | Freq: Every day | ORAL | 3 refills | Status: DC

## 2018-05-05 MED ORDER — RIVAROXABAN 20 MG PO TABS: 20.0000 mg | ORAL_TABLET | Freq: Every day | ORAL | 3 refills | Status: DC

## 2018-05-05 MED ORDER — OXYCODONE HCL 5 MG PO TABS
5.0000 mg | ORAL_TABLET | Freq: Once | ORAL | Status: AC
  Administered 2018-05-05: 5 mg via ORAL
  Filled 2018-05-05: qty 1

## 2018-05-05 MED ORDER — POTASSIUM CHLORIDE CRYS ER 20 MEQ PO TBCR
30.0000 meq | EXTENDED_RELEASE_TABLET | Freq: Four times a day (QID) | ORAL | Status: AC
  Administered 2018-05-05 (×2): 30 meq via ORAL
  Filled 2018-05-05 (×2): qty 1

## 2018-05-05 NOTE — Progress Notes (Signed)
Progress Note  Patient Name: Angelica Brown Date of Encounter: 05/05/2018  Primary Cardiologist: No primary care provider on file. [name]  Subjective   No chest pain this am. No dyspnea.   Inpatient Medications    Scheduled Meds: . aspirin  81 mg Oral Daily  . atorvastatin  80 mg Oral q1800  . metoprolol tartrate  12.5 mg Oral BID  . prasugrel  10 mg Oral Daily  . sodium chloride flush  3 mL Intravenous Once  . sodium chloride flush  3 mL Intravenous Q12H  . sodium chloride flush  3 mL Intravenous Q12H   Continuous Infusions: . sodium chloride Stopped (05/04/18 1729)  . sodium chloride    . heparin 1,400 Units/hr (05/05/18 0700)  . nitroGLYCERIN 5 mcg/min (05/05/18 0700)  . tirofiban 0.15 mcg/kg/min (05/05/18 0700)   PRN Meds: sodium chloride, sodium chloride, acetaminophen, ALPRAZolam, nitroGLYCERIN, ondansetron (ZOFRAN) IV, oxyCODONE, sodium chloride flush, sodium chloride flush, zolpidem   Vital Signs    Vitals:   05/05/18 0400 05/05/18 0500 05/05/18 0600 05/05/18 0700  BP: (!) 105/56 (!) 103/52 (!) 97/47   Pulse:      Resp: 16 16 17 13   Temp: 97.9 F (36.6 C)     TempSrc: Oral     SpO2: 97% 97% 95%   Weight:  (!) 151.4 kg    Height:        Intake/Output Summary (Last 24 hours) at 05/05/2018 0748 Last data filed at 05/05/2018 0700 Gross per 24 hour  Intake 1083.19 ml  Output 1300 ml  Net -216.81 ml   Last 3 Weights 05/05/2018 05/04/2018 04/20/2018  Weight (lbs) 333 lb 12.4 oz 326 lb 330 lb 9.6 oz  Weight (kg) 151.4 kg 147.873 kg 149.959 kg      Telemetry    Sinus with rare PVCs - Personally Reviewed  ECG    Sinus rhythm with inferior T wave inversion and inferior Q waves. - Personally Reviewed  Physical Exam    General: Well developed, well nourished, NAD  HEENT: OP clear, mucus membranes moist  SKIN: warm, dry. No rashes. Neuro: No focal deficits  Musculoskeletal: Muscle strength 5/5 all ext  Psychiatric: Mood and affect normal    Neck: No JVD, no carotid bruits, no thyromegaly, no lymphadenopathy.  Lungs:Clear bilaterally, no wheezes, rhonci, crackles Cardiovascular: Regular rate and rhythm. No murmurs, gallops or rubs. Abdomen:Soft. Bowel sounds present. Non-tender.  Extremities: No lower extremity edema. Pulses are 2 + in the bilateral DP/PT.   Labs    Chemistry Recent Labs  Lab 05/04/18 0834 05/05/18 0042  NA 140 139  K 3.5 3.4*  CL 106 107  CO2 23 25  GLUCOSE 115* 112*  BUN 11 7  CREATININE 0.70 0.79  CALCIUM 9.0 8.5*  PROT  --  6.3*  ALBUMIN  --  3.1*  AST  --  48*  ALT  --  18  ALKPHOS  --  53  BILITOT  --  0.9  GFRNONAA >60 >60  GFRAA >60 >60  ANIONGAP 11 7     Hematology Recent Labs  Lab 05/04/18 0834 05/05/18 0042  WBC 13.4* 15.1*  RBC 4.74 4.21  HGB 13.3 12.0  HCT 39.6 34.0*  MCV 83.5 80.8  MCH 28.1 28.5  MCHC 33.6 35.3  RDW 14.6 14.2  PLT 310 285    Cardiac Enzymes Recent Labs  Lab 05/04/18 1358 05/04/18 1841 05/05/18 0042  TROPONINI 2.32* 6.96* 11.59*    Recent Labs  Lab 05/04/18  0841  TROPIPOC 0.09*     BNPNo results for input(s): BNP, PROBNP in the last 168 hours.   DDimer No results for input(s): DDIMER in the last 168 hours.   Radiology    Dg Chest 2 View  Result Date: 05/04/2018 CLINICAL DATA:  Upper chest pain, nausea EXAM: CHEST - 2 VIEW COMPARISON:  03/10/2018 FINDINGS: Heart is borderline in size. No confluent airspace opacities or effusions. No acute bony abnormality. IMPRESSION: Borderline heart size.  No acute findings. Electronically Signed   By: Charlett Nose M.D.   On: 05/04/2018 08:24    Cardiac Studies   Echo 05/04/18: - Left ventricle: The cavity size was normal. Wall thickness was   increased in a pattern of moderate LVH. Systolic function was   mildly to moderately reduced. The estimated ejection fraction was   in the range of 40% to 45%. There is hypokinesis of the   inferolateral myocardium. Left ventricular diastolic function    parameters were normal. - Pulmonary arteries: Systolic pressure was mildly increased. PA   peak pressure: 47 mm Hg (S).  Impressions:  - Technically difficult; definity used; hypokinesis of the   inferolateral wall with overall mild to moderate LV dysfunction;   moderate LVH: mild TR; mild pulmonary hypertension.  Cardiac cath 05/04/18:  Patient Profile     43 y.o. female with history of CAD with prior stenting of the RCA in July 2019 and September 2019, anti-phospholipid antibody syndrome, lupus anti-coagulant, DVT, obesity and HTN admitted on 05/04/18 with an acute inferior MI. Her history is significant for hypercoagulability. Her first coronary event was in [place] in July 2019. A bare metal stent was placed in the mid RCA. She was discharged on Plavix, ASA and Xarelto. She had stent thrombosis in September 2019 and was felt to be a Plavix non-responder.  A drug eluting stent was placed in the RCA covering the entire bare metal stent. She was treated with Brilinta next but had dyspnea. She has been managed on Effient and Xarelto over the past 4 months but missed both medications for 3 days prior to this event due to medication cost. On presentation to the ED on 05/04/18, her inferior leads showed subtle ST elevation. She had ongoing 10/10 chest pain. Cardiac cath with 99% thrombotic occlusion of the mid RCA stented segment with poor flow to the distal vessel. Her thrombosis was treated with Angiojet thrombectomy with restoration of excellent flow to the distal vessel. There was no disease in the LAD or Circumflex. She was loaded with Effient in the cath lab and was started on Aggrastat.   Assessment & Plan    1. CAD/Acute inferior MI: No chest pain this am. Plans for 24 hours of Aggrastat infusion to complete this am. She is also on heparin.  Will continue ASA, Effient, statin. Beta blocker on hold with bradycardia.  Will stop IV heparin at 5 pm and start Xarelto 20 mg daily.  Will d/c IV NTG  and Aggrastat this am She is having no recurrent angina. Will not plan to restudy at this time.   2. Ischemic cardiomyopathy: LVEF=40-45% by echo yesterday. Beta blocker on hold due to bradycardia. BP will not tolerate an Ace-inh or ARB at this time.   For questions or updates, please contact [facility] Please consult [email or web address] for contact info under        Signed, Verne Carrow, MD  05/05/2018, 7:48 AM

## 2018-05-05 NOTE — Care Management (Signed)
Benefits check sent and pending for Xarelto and Effient. CM will discuss est copay costs once determined.   Colleen Can RN, BSN, NCM-BC, ACM-RN [phone]

## 2018-05-05 NOTE — Progress Notes (Signed)
[id number] Waited for pt to finish discussion with case manager before seeing. Pt very receptive to ed and voiced understanding. Stressed importance of meds. Reviewed NTG use, MI restrictions, watching carbs,low sodium and heart healthy food choices. Gave heart healthy and low sodium diets. Discussed CRP 2 and referred to [place]. Gave smoking cessation handout and encouraged to call 1800quitnow. Pt is down to a few cigarettes a day. Did not walk as still on aggrastat infusion. To be finished later this afternoon. Will follow up tomorrow to complete ed and walk. Luetta Nutting RN BSN 05/05/2018 11:55 AM

## 2018-05-05 NOTE — Progress Notes (Signed)
ANTICOAGULATION CONSULT NOTE  Pharmacy Consult for IV heparin and tirofiban Indication: NSTEMI, Hx VTE/antiphospholipid on Xarelto  Allergies  Allergen Reactions  . Ampicillin Shortness Of Breath and Swelling    Throat swells  . Penicillins Anaphylaxis and Shortness Of Breath    Throat swelling Has patient had a PCN reaction causing immediate rash, facial/tongue/throat swelling, SOB or lightheadedness with hypotension: Yes Has patient had a PCN reaction causing severe rash involving mucus membranes or skin necrosis: No Has patient had a PCN reaction that required hospitalization: No Has patient had a PCN reaction occurring within the last 10 years: Yes If all of the above answers are "NO", then may proceed with Cephalosporin use.   . Strawberry Extract Anaphylaxis, Itching and Swelling  . Cephalexin Hives, Itching and Swelling  . Prednisone Nausea And Vomiting, Rash and Other (See Comments)    Cramping in legs, could barely walk     Patient Measurements: Height: 5\' 9"  (175.3 cm) Weight: (!) 333 lb 12.4 oz (151.4 kg) IBW/kg (Calculated) : 66.2 Heparin Dosing Weight: 102 kg  Vital Signs: Temp: 98.3 F (36.8 C) (01/17 0700) Temp Source: Oral (01/17 0400) BP: 131/81 (01/17 1000)  Labs: Recent Labs    05/04/18 0834 05/04/18 0841 05/04/18 1358 05/04/18 1841 05/05/18 0042 05/05/18 0942  HGB 13.3  --   --   --  12.0  --   HCT 39.6  --   --   --  34.0*  --   PLT 310  --   --   --  285  --   APTT  --  31  --   --   --   --   LABPROT  --  12.1  --   --   --   --   INR  --  0.90  --   --   --   --   HEPARINUNFRC  --  <0.10*  --   --   --  <0.10*  CREATININE 0.70  --   --   --  0.79  --   TROPONINI  --   --  2.32* 6.96* 11.59*  --     Estimated Creatinine Clearance: 146.5 mL/min (by C-G formula based on SCr of 0.79 mg/dL).   Medical History: Past Medical History:  Diagnosis Date  . ACS (acute coronary syndrome) (HCC) 05/04/2018  . CHOLELITHIASIS   . Coronary artery  disease   . Dermatophytosis of nail   . 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 GERD (gastroesophageal reflux disease)   . Gestational diabetes    "w/all 4 pregnancies"  . Heart murmur   . Hypertension   . Metrorrhagia   . Migraine    "at least twice/month" (05/28/2016)  . Nystagmus 06/30/2016  . OBESITY, MORBID   . OBESITY, NOS   . Pneumonia 11/2014  . POSTURAL LIGHTHEADEDNESS   . Sickle cell trait (HCC)   . STEMI (ST elevation myocardial infarction) (HCC) 11/09/2017   in [place], [place] - see records in Care Everywhere  . SYNCOPE   . Throat pain   . TOBACCO DEPENDENCE   . URI   . Urinary frequency     Medications:  Medications Prior to Admission  Medication Sig Dispense Refill Last Dose  . aspirin EC 81 MG tablet Take 81 mg by mouth daily.    Past Week at Unknown time  . atorvastatin (LIPITOR) 80 MG tablet Take 1 tablet (  80 mg total) by mouth at bedtime. (Patient taking differently: Take 80 mg by mouth daily. ) 90 tablet 3 Past Week at Unknown time  . metoprolol tartrate (LOPRESSOR) 25 MG tablet Take 0.5 tablets (12.5 mg total) by mouth at bedtime. (Patient taking differently: Take 12.5 mg by mouth daily. ) 45 tablet 1 Past Week at 0930  . prasugrel (EFFIENT) 10 MG TABS tablet Take 1 tablet (10 mg total) by mouth at bedtime. (Patient taking differently: Take 10 mg by mouth daily. ) 90 tablet 0 Past Week at Unknown time  . rivaroxaban (XARELTO) 20 MG TABS tablet TAKE 1 TABLET BY MOUTH ONCE DAILY WITH SUPPER (Patient taking differently: Take 20 mg by mouth daily. ) 90 tablet 1 Past Week at 0930  . topiramate (TOPAMAX) 50 MG tablet Take 50 mg by mouth daily.   0 Past Week at Unknown time   Scheduled:  . aspirin  81 mg Oral Daily  . atorvastatin  80 mg Oral q1800  . potassium chloride  30 mEq Oral Q6H  . prasugrel  10 mg Oral Daily  . rivaroxaban  20 mg Oral Q supper  . sodium chloride flush  3  mL Intravenous Once  . sodium chloride flush  3 mL Intravenous Q12H  . sodium chloride flush  3 mL Intravenous Q12H  . topiramate  50 mg Oral Daily   Assessment: 17 YOF presented with chest pain, NSTEMI on 1/16 in the setting of running out of rivaroxaban and prasugrel for 2-3 days. 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  Pharmacy to restart IV heparin 8 hours after femoral sheath removed (starting 1/17 at 0200). HL this AM subtherapeutic at <0.10.   Prior anticoagulation: Xarelto 20 mg daily, Effient 10 mg daily, and ASA 81 mg daily; last dose > 2 days ago   Goal of Therapy: Heparin level 0.3-0.5 units/ml while on tirofiban Monitor platelets by anticoagulation protocol: Yes  Plan:  Increase heparin to 1700 units/hr with plan to transition back to rivaroxaban this afternoon.  Continue tirofiban 0.15 mcg/kg/min for 24 hours, ending today at 1400.  Continue monitoring H&H, platelets, and s/sx bleeding.   Greer Pickerel Pharmacy Student 05/05/2018 11:02 AM

## 2018-05-05 NOTE — Progress Notes (Signed)
   I have sent in prescriptions for Xarelto and Effient as well as refill for topiramate to the [facility] pharmacy so that pt will have these meds prior to her discharge.   Berton Bon, AGNP-C [facility] 05/05/2018  11:55 AM Pager: [phone]

## 2018-05-05 NOTE — Care Management Note (Addendum)
Case Management Note  Patient Details  Name: Angelica Brown MRN: [id number] Date of Birth: [birth year]  Subjective/Objective:  43 yo female presented with CP and NSTEMI; s/p cathe 1/16.               Action/Plan: CM met with patient to discuss dispositional needs. Patient reports living at home with her children and being independent with ADLs. PCP verified as: Dr. Chrisandra Netters; pharmacy of choice: [facility]. CM consult acknowledged for medication assist for effient and xarelto; patient indicated she was unable to get refills for 2 days due to issues with her insurance; patient recently relocated back to [facility] after working in [place], with her [facility] Medicaid still pending. Patient screened for [facility] and qualifies for assistance with letter completed and faxed to [facility] to be filled today. CM discussed [facility] for discounted medications for $4-$10, with patient stating her ability to afford for future refills; AVS updated. CM team will continue to follow.   Expected Discharge Date:                  Expected Discharge Plan:  Home/Self Care  In-House Referral:  NA  Discharge planning Services  CM Consult, Medication Assistance, [facility]  Post Acute Care Choice:  NA Choice offered to:  NA  DME Arranged:  N/A DME Agency:  NA  HH Arranged:  NA HH Agency:  NA  Status of Service:  In process, will continue to follow  If discussed at Long Length of Stay Meetings, dates discussed:    Additional Comments:  Midge Minium RN, BSN, NCM-BC, ACM-RN [phone] 05/05/2018, 11:34 AM

## 2018-05-05 NOTE — Care Management (Addendum)
#    3.   Angelica Brown @  [facility]  RX # [id number]  PATIENT HAS OUT  STATE   MEDICAID - [place] DOESN'T COVER OUT STATE PRESCRIPTION  1.EFFIENT  10 MG DAILY COVER- NONE FORMULARY PRIOR APPROVAL- YES # [phone]  2. PRASUGREL 10 MG DAILY COVER- YES CO-PAY- ZERO DOLLARS TIER- NO PRIOR APPROVAL- NO  3.  ELIQUIS  5 MG BID     AND  ELIQUIS  2.5 MG BID COVER-  NONE FORMULARY PRIOR APPROVAL-  [phone]  4. XARELTO 15 MG BID COVER- YES CO-PAY-  OVER Q/L  ONE PILL PER DAY TIER- NO  PRIOR APPROVAL- YES # [phone]  5. XARELTO 20 MG DAILY COVER- YES CO-PAY- ZERO DOLLARS TIER- NO PRIOR APPROVAL- NO  PREFERRED PHARMACY :  YES [facility]

## 2018-05-05 NOTE — Progress Notes (Signed)
At 0700, found pt sitting in chair in room. Appears restless. Pt states she suddenly started having sharp back pain. Pt states she was having back pain and initially thought she needed to have a bowel movement. After using the bathroom, she noticed blood in her stool and that the pain in her back  increased. Pt states that the pain radiates to both her legs and rates her pain 15 out of 10.  RN noted spec of blood with stool output. Got pt back in bed. Vital signs were stable. Pulses palpable in all extremities. Femoral site level zero. Skin on back appears the same, color appropriate for ethnicity, no bruising, soft to touch. Elink called. Relayed to incoming shift. Pt alert and oriented X4.  Off heparin and Aggrastat this shift. Started back on Xerolto this shift per orders. See mar, MD, and Pharmacy note.

## 2018-05-06 ENCOUNTER — Inpatient Hospital Stay (HOSPITAL_COMMUNITY): Payer: Medicaid Other

## 2018-05-06 LAB — CBC
HCT: 33.1 % — ABNORMAL LOW (ref 36.0–46.0)
Hemoglobin: 11.4 g/dL — ABNORMAL LOW (ref 12.0–15.0)
MCH: 27.8 pg (ref 26.0–34.0)
MCHC: 34.4 g/dL (ref 30.0–36.0)
MCV: 80.7 fL (ref 80.0–100.0)
PLATELETS: 250 10*3/uL (ref 150–400)
RBC: 4.1 MIL/uL (ref 3.87–5.11)
RDW: 14.2 % (ref 11.5–15.5)
WBC: 14.6 10*3/uL — ABNORMAL HIGH (ref 4.0–10.5)
nRBC: 0 % (ref 0.0–0.2)

## 2018-05-06 NOTE — Progress Notes (Addendum)
Progress Note  Patient Name: Angelica Brown Date of Encounter: 05/06/2018  Primary Cardiologist: Lesleigh Noe, MD [name]  Subjective   No chest pain this am. No dyspnea. Complains of back pain radiating down her right leg.   Inpatient Medications    Scheduled Meds: . aspirin  81 mg Oral Daily  . atorvastatin  80 mg Oral q1800  . prasugrel  10 mg Oral Daily  . rivaroxaban  20 mg Oral Q supper  . sodium chloride flush  3 mL Intravenous Once  . sodium chloride flush  3 mL Intravenous Q12H  . sodium chloride flush  3 mL Intravenous Q12H  . topiramate  50 mg Oral Daily   Continuous Infusions: . sodium chloride Stopped (05/04/18 1729)  . sodium chloride Stopped (05/06/18 1000)   PRN Meds: sodium chloride, sodium chloride, acetaminophen, ALPRAZolam, nitroGLYCERIN, ondansetron (ZOFRAN) IV, oxyCODONE, sodium chloride flush, sodium chloride flush, zolpidem   Vital Signs    Vitals:   05/06/18 0800 05/06/18 0900 05/06/18 1000 05/06/18 1100  BP: (!) 98/52 (!) 120/95 102/65 (!) 116/58  Pulse:      Resp: 18 10 17  (!) 21  Temp:      TempSrc:      SpO2: 97% 96% 98% 96%  Weight:      Height:        Intake/Output Summary (Last 24 hours) at 05/06/2018 1132 Last data filed at 05/06/2018 1102 Gross per 24 hour  Intake 942.1 ml  Output 2200 ml  Net -1257.9 ml   Last 3 Weights 05/06/2018 05/05/2018 05/04/2018  Weight (lbs) 333 lb 8.9 oz 333 lb 12.4 oz 326 lb  Weight (kg) 151.3 kg 151.4 kg 147.873 kg      Telemetry    Sinus RHYTHM, BRADYCARDIA IN 40' AT NIGHT - Personally Reviewed  ECG    Sinus rhythm with inferior T wave inversion and inferior Q waves. - Personally Reviewed  Physical Exam    General: Well developed, well nourished, NAD  HEENT: OP clear, mucus membranes moist  SKIN: warm, dry. No rashes. Neuro: No focal deficits  Musculoskeletal: Muscle strength 5/5 all ext  Psychiatric: Mood and affect normal  Neck: No JVD, no carotid bruits, no  thyromegaly, no lymphadenopathy.  Lungs:Clear bilaterally, no wheezes, rhonci, crackles Cardiovascular: Regular rate and rhythm. No murmurs, gallops or rubs. Abdomen:Soft. Bowel sounds present. Non-tender.  Extremities: No lower extremity edema. Pulses are 2 + in the bilateral DP/PT.   Labs    Chemistry Recent Labs  Lab 05/04/18 0834 05/05/18 0042  NA 140 139  K 3.5 3.4*  CL 106 107  CO2 23 25  GLUCOSE 115* 112*  BUN 11 7  CREATININE 0.70 0.79  CALCIUM 9.0 8.5*  PROT  --  6.3*  ALBUMIN  --  3.1*  AST  --  48*  ALT  --  18  ALKPHOS  --  53  BILITOT  --  0.9  GFRNONAA >60 >60  GFRAA >60 >60  ANIONGAP 11 7     Hematology Recent Labs  Lab 05/05/18 0042 05/05/18 2213 05/06/18 0418  WBC 15.1* 14.2* 14.6*  RBC 4.21 4.17 4.10  HGB 12.0 11.5* 11.4*  HCT 34.0* 33.8* 33.1*  MCV 80.8 81.1 80.7  MCH 28.5 27.6 27.8  MCHC 35.3 34.0 34.4  RDW 14.2 14.2 14.2  PLT 285 253 250    Cardiac Enzymes Recent Labs  Lab 05/04/18 1358 05/04/18 1841 05/05/18 0042  TROPONINI 2.32* 6.96* 11.59*  Recent Labs  Lab 05/04/18 0841  TROPIPOC 0.09*     BNPNo results for input(s): BNP, PROBNP in the last 168 hours.   DDimer No results for input(s): DDIMER in the last 168 hours.   Radiology    Ct Abdomen Pelvis Wo Contrast  Result Date: 05/06/2018 CLINICAL DATA:  43 year old female with back pain. Concern for retroperitoneal bleed. EXAM: CT ABDOMEN AND PELVIS WITHOUT CONTRAST TECHNIQUE: Multidetector CT imaging of the abdomen and pelvis was performed following the standard protocol without IV contrast. COMPARISON:  CT of the abdomen pelvis dated 04/18/2018 FINDINGS: Evaluation of this exam is limited in the absence of intravenous contrast. Lower chest: Borderline cardiomegaly. There is coronary vascular calcification. There are bibasilar linear atelectasis/scarring. Probable mild thickening of the posterior pleural surface versus less likely trace pleural effusion on the right. No  intra-abdominal free air or free fluid. Hepatobiliary: The liver is grossly unremarkable. No intrahepatic biliary ductal dilatation. The gallbladder is filled with stones. No pericholecystic fluid or evidence of acute cholecystitis by CT. Pancreas: Unremarkable. No pancreatic ductal dilatation or surrounding inflammatory changes. Spleen: Normal in size without focal abnormality. Adrenals/Urinary Tract: Adrenal glands are unremarkable. Kidneys are normal, without renal calculi, focal lesion, or hydronephrosis. Bladder is unremarkable. Stomach/Bowel: There is no bowel obstruction or active inflammation. Normal appendix. Vascular/Lymphatic: The abdominal aorta and IVC are grossly unremarkable on this noncontrast CT. No portal venous gas. There is no adenopathy. Reproductive: The uterus is anteverted and grossly unremarkable. There are 2 tubal ligation clips in the posterior pelvis. The left tubal ligation clip may have shifted. The ovaries are poorly visualized and not well evaluated. Other: None Musculoskeletal: No acute osseous pathology. Probable bone islands in the right femoral neck. IMPRESSION: 1. No acute intra-abdominal or pelvic pathology. Specifically no retroperitoneal or intraperitoneal hematoma. 2. No bowel obstruction or active inflammation. Normal appendix. 3. Cholelithiasis. Electronically Signed   By: Elgie Collard M.D.   On: 05/06/2018 01:45    Cardiac Studies   Echo 05/04/18: - Left ventricle: The cavity size was normal. Wall thickness was   increased in a pattern of moderate LVH. Systolic function was   mildly to moderately reduced. The estimated ejection fraction was   in the range of 40% to 45%. There is hypokinesis of the   inferolateral myocardium. Left ventricular diastolic function   parameters were normal. - Pulmonary arteries: Systolic pressure was mildly increased. PA   peak pressure: 47 mm Hg (S).  Impressions:  - Technically difficult; definity used; hypokinesis of  the   inferolateral wall with overall mild to moderate LV dysfunction;   moderate LVH: mild TR; mild pulmonary hypertension.  Cardiac cath 05/04/18:  Patient Profile     43 y.o. female with history of CAD with prior stenting of the RCA in July 2019 and September 2019, anti-phospholipid antibody syndrome, lupus anti-coagulant, DVT, obesity and HTN admitted on 05/04/18 with an acute inferior MI. Her history is significant for hypercoagulability. Her first coronary event was in [place] in July 2019. A bare metal stent was placed in the mid RCA. She was discharged on Plavix, ASA and Xarelto. She had stent thrombosis in September 2019 and was felt to be a Plavix non-responder.  A drug eluting stent was placed in the RCA covering the entire bare metal stent. She was treated with Brilinta next but had dyspnea. She has been managed on Effient and Xarelto over the past 4 months but missed both medications for 3 days prior to this event due to  medication cost. On presentation to the ED on 05/04/18, her inferior leads showed subtle ST elevation. She had ongoing 10/10 chest pain. Cardiac cath with 99% thrombotic occlusion of the mid RCA stented segment with poor flow to the distal vessel. Her thrombosis was treated with Angiojet thrombectomy with restoration of excellent flow to the distal vessel. There was no disease in the LAD or Circumflex. She was loaded with Effient in the cath lab and was started on Aggrastat.   Assessment & Plan    1. CAD/Acute inferior MI: No chest pain this am. She completed 24 hours of Aggrastat infusion yesterday. Off Heparin and NTG, low BP, no chest pain. NTG drip on hold as she is hypotensive - continue ASA and Effient, statin, no beta blockers she is bradycardic.  2. Ischemic cardiomyopathy: LVEF=40-45% by echo yesterday. Beta blocker on hold due to bradycardia. BP will not tolerate an Ace-inh or ARB at this time.   3. Known thrombophilia related to antiphospholipid antibody syndrome  and lupus erythematosus.  Likely precipitant was cessation of Xarelto. We will consult hematology to establish care.   4. Back pain radiating down her right leg - No acute intra-abdominal or pelvic pathology. Specifically no retroperitoneal or intraperitoneal hematoma. - no bruit or hematoma in the right cath access site - CT abdomen /pelvis showed: No acute intra-abdominal or pelvic pathology. Specifically no retroperitoneal or intraperitoneal hematoma.  For questions or updates, please contact [facility] Please consult [email or web address] for contact info under     Signed, Tobias Alexander, MD  05/06/2018, 11:32 AM

## 2018-05-06 NOTE — Progress Notes (Signed)
Cards paged d/t pt complaining of sharp pain in back that radiated to legs, small speck of bright red blood in stool. Orders to admin pain medication and report back if pain persisted,

## 2018-05-06 NOTE — Progress Notes (Signed)
Cards paged again d/t pt pain persisting, 7/10 in lower back.  Orders to obtain stat CBC, CT of abdomen, and one time order of 5 mg oxycodone.

## 2018-05-06 NOTE — Progress Notes (Signed)
CARDIAC REHAB PHASE I   PRE:  Rate/Rhythm: 55 SB    BP: sitting 98/59    SaO2: 98 RA  MODE:  Ambulation: 25 ft in hall, sat x1   POST:  Rate/Rhythm: 67 SR    BP: sitting 128/82     SaO2: 92 RA  Pt eager to walk and go home but very limited by her lower back pain. Ambulated to BR with RW then into hall. Wanted to walk without RW however needed it for support. Sts her lower back pain is 10/10, like knives. Also sts her thighs are tingling. Had to sit in hall after 12 ft and rest. Then able to return to bed. She is frustrated, wants to go home. She is unsafe at home at this time.  [id number]   Harriet Masson CES, ACSM 05/06/2018 12:05 PM

## 2018-05-07 MED ORDER — MORPHINE SULFATE (PF) 2 MG/ML IV SOLN
2.0000 mg | INTRAVENOUS | Status: DC | PRN
  Administered 2018-05-07: 2 mg via INTRAVENOUS
  Filled 2018-05-07: qty 1

## 2018-05-07 NOTE — Progress Notes (Signed)
Went back to check on pt... pt on phone talking to cousin... Sts pain is still the same and no changes  Pt A&O x4... no acute distress.

## 2018-05-07 NOTE — Progress Notes (Signed)
Pt c/o intermittent CP onset 0015 that woke her up... Sts it  will last 10 sec. Voices no other sx.... Denies headache, blurred vision, diaphoresis, nausea..... Sts it "doesn't feel like previous heart attack"  A&O x4. Pt is texting sig other and watching TV comfortablly.  Repeat ECG done w/no changes from previous ECGs.   Text sent to Cards fellow.... new orders for morphine placed... call back if sx to not improve.   Will cont to monitor pt closely.

## 2018-05-07 NOTE — Plan of Care (Signed)
  Problem: Education: Goal: Knowledge of General Education information will improve Description Including pain rating scale, medication(s)/side effects and non-pharmacologic comfort measures Outcome: Progressing   Problem: Clinical Measurements: Goal: Will remain free from infection Outcome: Progressing Goal: Diagnostic test results will improve Outcome: Progressing Goal: Respiratory complications will improve Outcome: Progressing   Problem: Activity: Goal: Risk for activity intolerance will decrease Outcome: Progressing   Problem: Elimination: Goal: Will not experience complications related to bowel motility Outcome: Progressing Goal: Will not experience complications related to urinary retention Outcome: Progressing   Problem: Pain Managment: Goal: General experience of comfort will improve Outcome: Progressing   Problem: Safety: Goal: Ability to remain free from injury will improve Outcome: Progressing   Problem: Skin Integrity: Goal: Risk for impaired skin integrity will decrease Outcome: Progressing   Problem: Cardiovascular: Goal: Ability to achieve and maintain adequate cardiovascular perfusion will improve Outcome: Progressing   Problem: Health Behavior/Discharge Planning: Goal: Ability to safely manage health-related needs after discharge will improve Outcome: Progressing

## 2018-05-07 NOTE — Progress Notes (Signed)
Went in to give Morphine IV... pt sts she feels a little better after "burping".... Believes it may be indigestion.  Morphine given... will cont to monitor pt.... Adv pt to call if sx worsen... Pt verb understanding.

## 2018-05-07 NOTE — Progress Notes (Signed)
Progress Note  Patient Name: Angelica Brown Date of Encounter: 05/07/2018  Primary Cardiologist: Lesleigh Noe, MD [name]  Subjective   She is feeling better today.   Inpatient Medications    Scheduled Meds: . aspirin  81 mg Oral Daily  . atorvastatin  80 mg Oral q1800  . prasugrel  10 mg Oral Daily  . rivaroxaban  20 mg Oral Q supper  . sodium chloride flush  3 mL Intravenous Once  . sodium chloride flush  3 mL Intravenous Q12H  . sodium chloride flush  3 mL Intravenous Q12H  . topiramate  50 mg Oral Daily   Continuous Infusions: . sodium chloride Stopped (05/04/18 1729)  . sodium chloride Stopped (05/06/18 1000)   PRN Meds: sodium chloride, sodium chloride, acetaminophen, ALPRAZolam, morphine injection, nitroGLYCERIN, ondansetron (ZOFRAN) IV, oxyCODONE, sodium chloride flush, sodium chloride flush, zolpidem   Vital Signs    Vitals:   05/07/18 0801 05/07/18 0900 05/07/18 1000 05/07/18 1100  BP:  (!) 105/56 101/70 (!) 120/54  Pulse:      Resp:  16 20 19   Temp: 97.8 F (36.6 C)     TempSrc: Oral     SpO2:  97% 97% 96%  Weight:      Height:        Intake/Output Summary (Last 24 hours) at 05/07/2018 1148 Last data filed at 05/07/2018 0800 Gross per 24 hour  Intake 483 ml  Output 700 ml  Net -217 ml   Last 3 Weights 05/07/2018 05/06/2018 05/05/2018  Weight (lbs) 329 lb 12.9 oz 333 lb 8.9 oz 333 lb 12.4 oz  Weight (kg) 149.6 kg 151.3 kg 151.4 kg      Telemetry    Sinus RHYTHM, BRADYCARDIA IN 40' AT NIGHT - Personally Reviewed  ECG    Sinus rhythm with inferior T wave inversion and inferior Q waves. - Personally Reviewed  Physical Exam   General: Well developed, well nourished, NAD  HEENT: OP clear, mucus membranes moist  SKIN: warm, dry. No rashes. Neuro: No focal deficits  Musculoskeletal: Muscle strength 5/5 all ext  Psychiatric: Mood and affect normal  Neck: No JVD, no carotid bruits, no thyromegaly, no lymphadenopathy.  Lungs:Clear  bilaterally, no wheezes, rhonci, crackles Cardiovascular: Regular rate and rhythm. No murmurs, gallops or rubs. Abdomen:Soft. Bowel sounds present. Non-tender.  Extremities: No lower extremity edema. Pulses are 2 + in the bilateral DP/PT.  Labs    Chemistry Recent Labs  Lab 05/04/18 0834 05/05/18 0042  NA 140 139  K 3.5 3.4*  CL 106 107  CO2 23 25  GLUCOSE 115* 112*  BUN 11 7  CREATININE 0.70 0.79  CALCIUM 9.0 8.5*  PROT  --  6.3*  ALBUMIN  --  3.1*  AST  --  48*  ALT  --  18  ALKPHOS  --  53  BILITOT  --  0.9  GFRNONAA >60 >60  GFRAA >60 >60  ANIONGAP 11 7     Hematology Recent Labs  Lab 05/05/18 0042 05/05/18 2213 05/06/18 0418  WBC 15.1* 14.2* 14.6*  RBC 4.21 4.17 4.10  HGB 12.0 11.5* 11.4*  HCT 34.0* 33.8* 33.1*  MCV 80.8 81.1 80.7  MCH 28.5 27.6 27.8  MCHC 35.3 34.0 34.4  RDW 14.2 14.2 14.2  PLT 285 253 250    Cardiac Enzymes Recent Labs  Lab 05/04/18 1358 05/04/18 1841 05/05/18 0042  TROPONINI 2.32* 6.96* 11.59*    Recent Labs  Lab 05/04/18 0841  TROPIPOC  0.09*     BNPNo results for input(s): BNP, PROBNP in the last 168 hours.   DDimer No results for input(s): DDIMER in the last 168 hours.   Radiology    Ct Abdomen Pelvis Wo Contrast  Result Date: 05/06/2018 CLINICAL DATA:  43 year old female with back pain. Concern for retroperitoneal bleed. EXAM: CT ABDOMEN AND PELVIS WITHOUT CONTRAST TECHNIQUE: Multidetector CT imaging of the abdomen and pelvis was performed following the standard protocol without IV contrast. COMPARISON:  CT of the abdomen pelvis dated 04/18/2018 FINDINGS: Evaluation of this exam is limited in the absence of intravenous contrast. Lower chest: Borderline cardiomegaly. There is coronary vascular calcification. There are bibasilar linear atelectasis/scarring. Probable mild thickening of the posterior pleural surface versus less likely trace pleural effusion on the right. No intra-abdominal free air or free fluid.  Hepatobiliary: The liver is grossly unremarkable. No intrahepatic biliary ductal dilatation. The gallbladder is filled with stones. No pericholecystic fluid or evidence of acute cholecystitis by CT. Pancreas: Unremarkable. No pancreatic ductal dilatation or surrounding inflammatory changes. Spleen: Normal in size without focal abnormality. Adrenals/Urinary Tract: Adrenal glands are unremarkable. Kidneys are normal, without renal calculi, focal lesion, or hydronephrosis. Bladder is unremarkable. Stomach/Bowel: There is no bowel obstruction or active inflammation. Normal appendix. Vascular/Lymphatic: The abdominal aorta and IVC are grossly unremarkable on this noncontrast CT. No portal venous gas. There is no adenopathy. Reproductive: The uterus is anteverted and grossly unremarkable. There are 2 tubal ligation clips in the posterior pelvis. The left tubal ligation clip may have shifted. The ovaries are poorly visualized and not well evaluated. Other: None Musculoskeletal: No acute osseous pathology. Probable bone islands in the right femoral neck. IMPRESSION: 1. No acute intra-abdominal or pelvic pathology. Specifically no retroperitoneal or intraperitoneal hematoma. 2. No bowel obstruction or active inflammation. Normal appendix. 3. Cholelithiasis. Electronically Signed   By: Elgie CollardArash  Radparvar M.D.   On: 05/06/2018 01:45    Cardiac Studies   Echo 05/04/18: - Left ventricle: The cavity size was normal. Wall thickness was   increased in a pattern of moderate LVH. Systolic function was   mildly to moderately reduced. The estimated ejection fraction was   in the range of 40% to 45%. There is hypokinesis of the   inferolateral myocardium. Left ventricular diastolic function   parameters were normal. - Pulmonary arteries: Systolic pressure was mildly increased. PA   peak pressure: 47 mm Hg (S).  Impressions:  - Technically difficult; definity used; hypokinesis of the   inferolateral wall with overall  mild to moderate LV dysfunction;   moderate LVH: mild TR; mild pulmonary hypertension.  Cardiac cath 05/04/18:  Patient Profile     43 y.o. female with history of CAD with prior stenting of the RCA in July 2019 and September 2019, anti-phospholipid antibody syndrome, lupus anti-coagulant, DVT, obesity and HTN admitted on 05/04/18 with an acute inferior MI. Her history is significant for hypercoagulability. [place]. A bare metal stent was placed in the mid RCA. She was discharged on Plavix, ASA and Xarelto. She had stent thrombosis in September 2019 and was felt to be a Plavix non-responder.  A drug eluting stent was placed in the RCA covering the entire bare metal stent. She was treated with Brilinta next but had dyspnea. She has been managed on Effient and Xarelto over the past 4 months but missed both medications for 3 days prior to this event due to medication cost. On presentation to the ED on  05/04/18, her inferior leads showed subtle ST elevation. She had ongoing 10/10 chest pain. Cardiac cath with 99% thrombotic occlusion of the mid RCA stented segment with poor flow to the distal vessel. Her thrombosis was treated with Angiojet thrombectomy with restoration of excellent flow to the distal vessel. There was no disease in the LAD or Circumflex. She was loaded with Effient in the cath lab and was started on Aggrastat.   Assessment & Plan    1. CAD/Acute inferior MI: No chest pain this am. She completed 24 hours of Aggrastat infusion on 05/05/18. Off Heparin and NTG, low BP, no chest pain. NTG drip on hold as she is hypotensive - continue ASA and Effient, statin, no beta blockers she is bradycardic.  2. Ischemic cardiomyopathy: LVEF=40-45% by echo yesterday. Beta blocker on hold due to bradycardia. BP will not tolerate an Ace-inh or ARB at this time.   3. 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  Likely  precipitant was cessation of Xarelto. Dr Pamelia Hoit from hematology was called and they will re-establish outpatient care.   4. Back pain radiating down her right leg - No acute intra-abdominal or pelvic pathology. Specifically no retroperitoneal or intraperitoneal hematoma. - no bruit or hematoma in the right cath access site - CT abdomen /pelvis showed: No acute intra-abdominal or pelvic pathology. Specifically no retroperitoneal or intraperitoneal hematoma.  5. OSA - she has a referral and will follow  We will transfer to telemetry today, anticipated discharge tomorrow, back to work on 05/15/18 - she will need a letter.   For questions or updates, please contact [facility] Please consult [email or web address] for contact info under     Signed, Tobias Alexander, MD  05/07/2018, 11:48 AM

## 2018-05-07 NOTE — Progress Notes (Signed)
Ambulated pt to bathroom... tolerated well  Asked about CP... sts no longer has pain.... just c/o back pain

## 2018-05-08 ENCOUNTER — Telehealth: Payer: Self-pay

## 2018-05-08 MED ORDER — ASPIRIN EC 81 MG PO TBEC: 81.0000 mg | DELAYED_RELEASE_TABLET | Freq: Every day | ORAL | Status: DC

## 2018-05-08 MED ORDER — RIVAROXABAN 20 MG PO TABS: 20.0000 mg | ORAL_TABLET | Freq: Every day | ORAL | 3 refills | Status: DC

## 2018-05-08 MED ORDER — POTASSIUM CHLORIDE CRYS ER 20 MEQ PO TBCR
40.0000 meq | EXTENDED_RELEASE_TABLET | Freq: Once | ORAL | Status: AC
  Administered 2018-05-08: 40 meq via ORAL
  Filled 2018-05-08: qty 2

## 2018-05-08 MED ORDER — NITROGLYCERIN 0.4 MG SL SUBL: 0.4000 mg | SUBLINGUAL_TABLET | SUBLINGUAL | 12 refills | Status: DC | PRN

## 2018-05-08 NOTE — Care Management Note (Signed)
Case Management Note  Patient Details  Name: Angelica Brown MRN: [id number] Date of Birth: 1975-12-12  Subjective/Objective:                    Action/Plan: Pt states she already has d/c medications at home. Pt has a local PCP. She is working to get her Medicaid changed back to [employer]. She has transportation home.   Expected Discharge Date:  05/08/18               Expected Discharge Plan:  Home/Self Care  In-House Referral:  NA  Discharge planning Services  CM Consult, Medication Assistance, MATCH Program  Post Acute Care Choice:  NA Choice offered to:  NA  DME Arranged:  N/A DME Agency:  NA  HH Arranged:  NA HH Agency:  NA  Status of Service:  Completed, signed off  If discussed at Long Length of Stay Meetings, dates discussed:    Additional Comments:  Kermit Balo, RN 05/08/2018, 11:21 AM

## 2018-05-08 NOTE — Progress Notes (Addendum)
Progress Note  Patient Name: Angelica Brown Date of Encounter: 05/08/2018  Primary Cardiologist: Lesleigh Noe, MD Angelica Brown  Subjective   No chest pain or SOB, did well walking w/ rehab.   Has to walk a lot at work and carry files at times. Not sure she will be ready to go back on the 27th.   Inpatient Medications    Scheduled Meds: . aspirin  81 mg Oral Daily  . atorvastatin  80 mg Oral q1800  . prasugrel  10 mg Oral Daily  . rivaroxaban  20 mg Oral Q supper  . sodium chloride flush  3 mL Intravenous Once  . sodium chloride flush  3 mL Intravenous Q12H  . sodium chloride flush  3 mL Intravenous Q12H  . topiramate  50 mg Oral Daily   Continuous Infusions: . sodium chloride Stopped (05/04/18 1729)  . sodium chloride Stopped (05/06/18 1000)   PRN Meds: sodium chloride, sodium chloride, acetaminophen, ALPRAZolam, morphine injection, nitroGLYCERIN, ondansetron (ZOFRAN) IV, oxyCODONE, sodium chloride flush, sodium chloride flush, zolpidem   Vital Signs    Vitals:   05/07/18 1154 05/07/18 1332 05/07/18 2049 05/08/18 0604  BP:  103/61 (!) 98/45 116/65  Pulse:  (!) 52 (!) 56 62  Resp:   18 18  Temp: 98.5 F (36.9 C)  98 F (36.7 C) 98.2 F (36.8 C)  TempSrc: Oral  Oral Oral  SpO2:  97% 96% 91%  Weight:    (!) 147.2 kg  Height:        Intake/Output Summary (Last 24 hours) at 05/08/2018 0905 Last data filed at 05/07/2018 1842 Gross per 24 hour  Intake 1240 ml  Output -  Net 1240 ml   Last 3 Weights 05/08/2018 05/07/2018 05/06/2018  Weight (lbs) 324 lb 8 oz 329 lb 12.9 oz 333 lb 8.9 oz  Weight (kg) 147.192 kg 149.6 kg 151.3 kg      Telemetry    SR, S brady to high 30s while asleep - Personally Reviewed  ECG    01/19, S brady, HR 55, deep inferior T waves and Q waves  - Personally Reviewed  Physical Exam   General: Well developed, obese, female in no acute distress Head: Eyes PERRLA, No xanthomas.   Normocephalic and atraumatic Lungs: Clear  bilaterally to auscultation. Heart: HRRR S1 S2, without MRG.  Pulses are 2+ & equal. No JVD. Abdomen: Bowel sounds are present, abdomen soft and non-tender without masses or  hernias noted. Msk: Normal strength and tone for age. R radial cath site w/out ecchymosis or hematoma Extremities: No clubbing, cyanosis or edema.    Skin:  No rashes or lesions noted. Neuro: Alert and oriented X 3. Psych:  Good affect, responds appropriately   Labs    Chemistry Recent Labs  Lab 05/04/18 0834 05/05/18 0042  NA 140 139  K 3.5 3.4*  CL 106 107  CO2 23 25  GLUCOSE 115* 112*  BUN 11 7  CREATININE 0.70 0.79  CALCIUM 9.0 8.5*  PROT  --  6.3*  ALBUMIN  --  3.1*  AST  --  48*  ALT  --  18  ALKPHOS  --  53  BILITOT  --  0.9  GFRNONAA >60 >60  GFRAA >60 >60  ANIONGAP 11 7     Hematology Recent Labs  Lab 05/05/18 0042 05/05/18 2213 05/06/18 0418  WBC 15.1* 14.2* 14.6*  RBC 4.21 4.17 4.10  HGB 12.0 11.5* 11.4*  HCT 34.0* 33.8* 33.1*  MCV 80.8 81.1 80.7  MCH 28.5 27.6 27.8  MCHC 35.3 34.0 34.4  RDW 14.2 14.2 14.2  PLT 285 253 250    Cardiac Enzymes Recent Labs  Lab 05/04/18 1358 05/04/18 1841 05/05/18 0042  TROPONINI 2.32* 6.96* 11.59*    Recent Labs  Lab 05/04/18 0841  TROPIPOC 0.09*     Radiology    No results found.  Cardiac Studies   Echo 05/04/18: - Left ventricle: The cavity size was normal. Wall thickness was   increased in a pattern of moderate LVH. Systolic function was   mildly to moderately reduced. The estimated ejection fraction was   in the range of 40% to 45%. There is hypokinesis of the   inferolateral myocardium. Left ventricular diastolic function   parameters were normal. - Pulmonary arteries: Systolic pressure was mildly increased. PA   peak pressure: 47 mm Hg (S).  Impressions:  - Technically difficult; definity used; hypokinesis of the   inferolateral wall with overall mild to moderate LV dysfunction;   moderate LVH: mild TR; mild  pulmonary hypertension.  Cardiac cath 05/04/18:  Patient Profile     43 y.o. female with history of CAD with prior stenting of the RCA in July 2019 and September 2019, anti-phospholipid antibody syndrome, lupus anti-coagulant, DVT, obesity and HTN admitted on 05/04/18 with an acute inferior MI. Her history is significant for hypercoagulability. Her first coronary event was in [place] in July 2019. A bare metal stent was placed in the mid RCA. She was discharged on Plavix, ASA and Xarelto. She had stent thrombosis in September 2019 and was felt to be a Plavix non-responder.  A drug eluting stent was placed in the RCA covering the entire bare metal stent. She was treated with Brilinta next but had dyspnea. She has been managed on Effient and Xarelto over the past 4 months but missed both medications for 3 days prior to this event due to medication cost. On presentation to the ED on 05/04/18, her inferior leads showed subtle ST elevation. She had ongoing 10/10 chest pain. Cardiac cath with 99% thrombotic occlusion of the mid RCA stented segment with poor flow to the distal vessel. Her thrombosis was treated with Angiojet thrombectomy with restoration of excellent flow to the distal vessel. There was no disease in the LAD or Circumflex. She was loaded with Effient in the cath lab and was started on Aggrastat.   Assessment & Plan    1. CAD/Acute inferior MI:  - no CP or SOB - Off hep and nitrates - on ASA, Effient (Plavix non-responder, did not tolerate Brilinta) - no BB/ACE/ARB due to low BP/HR - on high-dose statin  2. Ischemic cardiomyopathy: LVEF=40-45% by echo 01/18.  - no BB 2nd low HR - no ACE/ARB due to low BP - no volume overload, needs low Na diet info at d/c  3. 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   - pt had been off Xarelto - Dr Pamelia Hoit contacted, will f/u as outpt  4. Back pain radiating down her right leg -  - CT results above, no  reason for her pain on this. - no distress right now, f/u with PCP  5. OSA - Referral made, pt says will f/u  Plan: possible d/c today.  For questions or updates, please contact [facility] Please consult [email or web address] for contact info under     Signed, Theodore Demark, PA-C  05/08/2018, 9:05 AM    History and all data above reviewed.  Patient examined.  I agree with the findings as above.  She has had no further pain.  No SOB. The patient exam reveals COR:RRR  ,  Lungs: clear  ,  Abd: Positive bowel sounds, no rebound no guarding, Ext no edema, right radial without bleeding or bruising  .  All available labs, radiology testing, previous records reviewed. Agree with documented assessment and plan. CAD:  Home on ASA x 1 month, Xarelto and Effient. Note:  Mildly reduced EF . However, BP has been low and labile.  Try to titrate ACE inhibitor as an outpatient.  Also bradycardia so cannot take beta blocker at this point.  Please document this in the discharge note.    Rollene Rotunda  9:37 AM  05/08/2018

## 2018-05-08 NOTE — Telephone Encounter (Signed)
The pt is being discharged from the hospital today. Will call her tomorrow. 

## 2018-05-08 NOTE — Progress Notes (Addendum)
CARDIAC REHAB PHASE I   PRE:  Rate/Rhythm: 50 SB  BP:  Supine:   Sitting: 108/53  Standing:    SaO2: 96%RA  MODE:  Ambulation: 400 ft   POST:  Rate/Rhythm: 79 SR  BP:  Supine:   Sitting: 117/61  Standing:    SaO2: 97%RA [id number] Pt's back feeling some better. Walked 400 ft on RA with asst x 2 with hand held asst. Pt stopped once to rest. C/o pain down both legs but seems worse on right side. No CP. Pt wants to go home. Stated going to go to work tomorrow. Discussed with pt that this would not be safe. Notified cardiology PA who will address with pt also. Went over walking for ex increasing distance gradually.  Luetta Nutting, RN BSN  05/08/2018 8:29 AM

## 2018-05-08 NOTE — Discharge Instructions (Signed)
PLEASE REMEMBER TO BRING ALL OF YOUR MEDICATIONS TO EACH OF YOUR FOLLOW-UP OFFICE VISITS. ° °PLEASE ATTEND ALL SCHEDULED FOLLOW-UP APPOINTMENTS.  ° °Activity: Increase activity slowly as tolerated. You may shower, but no soaking baths (or swimming) for 1 week. No driving for 1 week. No lifting over 5 lbs for 2 weeks. No sexual activity for 1 week.  ° °You May Return to Work: in 3 weeks (if applicable) ° °Wound Care: You may wash cath site gently with soap and water. Keep cath site clean and dry. If you notice pain, swelling, bleeding or pus at your cath site, please call [phone]. ° ° ° °Cardiac Cath Site Care °Refer to this sheet in the next few weeks. These instructions provide you with information on caring for yourself after your procedure. Your caregiver may also give you more specific instructions. Your treatment has been planned according to current medical practices, but problems sometimes occur. Call your caregiver if you have any problems or questions after your procedure. °HOME CARE INSTRUCTIONS °· You may shower 24 hours after the procedure. Remove the bandage (dressing) and gently wash the site with plain soap and water. Gently pat the site dry.  °· Do not apply powder or lotion to the site.  °· Do not sit in a bathtub, swimming pool, or whirlpool for 5 to 7 days.  °· No bending, squatting, or lifting anything over 10 pounds (4.5 kg) as directed by your caregiver.  °· Inspect the site at least twice daily.  °· Do not drive home if you are discharged the same day of the procedure. Have someone else drive you.  °· You may drive 24 hours after the procedure unless otherwise instructed by your caregiver.  °What to expect: °· Any bruising will usually fade within 1 to 2 weeks.  °· Blood that collects in the tissue (hematoma) may be painful to the touch. It should usually decrease in size and tenderness within 1 to 2 weeks.  °SEEK IMMEDIATE MEDICAL CARE IF: °· You have unusual pain at the site or down the  affected limb.  °· You have redness, warmth, swelling, or pain at the site.  °· You have drainage (other than a small amount of blood on the dressing).  °· You have chills.  °· You have a fever or persistent symptoms for more than 72 hours.  °· You have a fever and your symptoms suddenly get worse.  °· Your leg becomes pale, cool, tingly, or numb.  °· You have heavy bleeding from the site. Hold pressure on the site.  °Document Released: 05/08/2010 Document Revised: 03/25/2011 Document Reviewed:  ° °

## 2018-05-08 NOTE — Telephone Encounter (Signed)
New message     Pt has toc appt with Tereso Newcomer on 2/7 at 815am .

## 2018-05-08 NOTE — Discharge Summary (Signed)
Discharge Summary    Patient ID: Angelica Brown,  MRN: [id number], DOB/AGE: 01-06-1976 43 y.o.  Admit date: 05/04/2018 Discharge date: 05/08/2018  Primary Care Provider: Latrelle Dodrill Primary Cardiologist: Lesleigh Noe, MD   Discharge Diagnoses    Principal Problem:   Acute myocardial infarction [facility]) Active Problems:   Chronic diastolic heart failure (HCC)   Antiphospholipid antibody syndrome (HCC)   ACS (acute coronary syndrome) (HCC)   Allergies Allergies  Allergen Reactions  . Ampicillin Shortness Of Breath and Swelling    Throat swells  . Penicillins Anaphylaxis and Shortness Of Breath    Throat swelling Has patient had a PCN reaction causing immediate rash, facial/tongue/throat swelling, SOB or lightheadedness with hypotension: Yes Has patient had a PCN reaction causing severe rash involving mucus membranes or skin necrosis: No Has patient had a PCN reaction that required hospitalization: No Has patient had a PCN reaction occurring within the last 10 years: Yes If all of the above answers are "NO", then may proceed with Cephalosporin use.   . Strawberry Extract Anaphylaxis, Itching and Swelling  . Cephalexin Hives, Itching and Swelling  . Prednisone Nausea And Vomiting, Rash and Other (See Comments)    Cramping in legs, could barely walk     Diagnostic Studies/Procedures    ECHO: 05/04/2018 - Left ventricle: The cavity size was normal. Wall thickness was   increased in a pattern of moderate LVH. Systolic function was   mildly to moderately reduced. The estimated ejection fraction was   in the range of 40% to 45%. There is hypokinesis of the   inferolateral myocardium. Left ventricular diastolic function   parameters were normal. - Pulmonary arteries: Systolic pressure was mildly increased. PA   peak pressure: 47 mm Hg (S).  Impressions:  - Technically difficult; definity used; hypokinesis of the   inferolateral wall with overall  mild to moderate LV dysfunction;   moderate LVH: mild TR; mild pulmonary hypertension.  CATH: 05/04/2018  Right coronary stent thrombosis in the setting of thrombophilia related to antiphospholipid antibody syndrome and lupus erythematosus.  Likely precipitant was cessation of Xarelto and Effient 48 to 72 hours prior to onset of symptoms at 4 AM.  Thrombosed right coronary stent 99% with TIMI grade II flow and evidence of embolic occlusion of the mid PDA.  Post AngioJet thrombectomy significant thrombus burden removal, reduced obstruction to less than 40%, restoration of TIMI grade III flow, persistent embolus in mid to distal PDA.  Widely patent left main, LAD, and codominant circumflex.  LVEDP less than 5 mmHg at the beginning of the procedure.  RECOMMENDATIONS:   Aggrastat x24 hours then DC  Patient is loaded with Effient 60 mg and should then continue Effient 10 mg/day and aspirin while in hospital.  Aspirin can be dropped after 1 month.  IV heparin x12 to 24 hours and if no recurrent ischemic symptoms, transition back to Xarelto orally and discontinue heparin.  If she remains asymptomatic on antiplatelet and antithrombotic therapy, would not restudy.  If recurrent angina may need to relook tomorrow or Monday.  IV nitroglycerin at 10-30 mics for 24 hours to facilitate microcirculation vasodilatation. _____________   History of Present Illness     43 y.o. female with history of CAD with prior stenting of the RCA in July 2019 and September 2019, anti-phospholipid antibody syndrome, lupus anti-coagulant, DVT, obesity and HTN admitted on 05/04/18 with an acute inferior MI. Her history is significant for hypercoagulability. Her first coronary event  was in [place] in July 2019. A bare metal stent was placed in the mid RCA. She was discharged on Plavix, ASA and Xarelto. She had stent thrombosis in September 2019 and was felt to be a Plavix non-responder.  A drug eluting stent was placed in  the RCA covering the entire bare metal stent. She was treated with Brilinta next but had dyspnea. She has been managed on Effient and Xarelto over the past 4 months but missed both medications for 3 days prior to this event due to medication cost.    Hospital Course     Consultants: None  On presentation to the ED on 05/04/18, her inferior leads showed subtle ST elevation, but did not meet STEMI criteria.  However, she had ongoing 10/10 chest pain.  She was declared an urgent case and taken directly to the Cath Lab.  Cardiac cath showed 99% thrombotic occlusion of the mid RCA stented segment with poor flow to the distal vessel. Her thrombosis was treated with Angiojet thrombectomy with restoration of excellent flow to the distal vessel. There was no disease in the LAD or Circumflex. She was loaded with Effient in the cath lab and was started on Aggrastat.  She will be on Effient and aspirin for a month and then Effient alone.  She completed 24 hours of Aggrastat.  She had been on heparin and nitrates, but the nitrates were discontinued due to low blood pressure.  She completed the heparin.  Her heart rate was too low for a beta-blocker and her blood pressure was too low for an ACE inhibitor or ARB.  She is on high-dose statin.  Her heart rate was noted to be into the high 30s overnight.  She is felt to be at high risk for sleep apnea and is to get a sleep study as an outpatient.  The event was felt to be precipitated by cessation of Xarelto and Effient in the setting of antiphospholipid antibody syndrome and lupus erythematosus.  Hematology was contacted and she will follow-up with them.  The importance of not missing doses of her Effient and of her Xarelto was emphasized.  She had some back pain radiating down her leg.  An abdominal CT was performed which showed no acute intra-abdominal or pelvic pathology.  Her pain improved.  She was seen by cardiac rehab and ambulated without chest pain or  shortness of breath.  On 1/20, she was seen by Dr. Antoine PocheHochrein and all data were reviewed.  No further inpatient work-up is indicated and she is considered stable for discharge, to follow-up as an outpatient.  _____________  Discharge Vitals Blood pressure 116/65, pulse 62, temperature 98.2 F (36.8 C), temperature source Oral, resp. rate 18, height 5\' 9"  (1.753 m), weight (!) 147.2 kg, last menstrual period 04/13/2018, SpO2 91 %.  Filed Weights   05/06/18 0500 05/07/18 0500 05/08/18 0604  Weight: (!) 151.3 kg (!) 149.6 kg (!) 147.2 kg    Labs & Radiologic Studies    CBC Recent Labs    05/05/18 2213 05/06/18 0418  WBC 14.2* 14.6*  HGB 11.5* 11.4*  HCT 33.8* 33.1*  MCV 81.1 80.7  PLT 253 250   Basic Metabolic Panel    Component Value Date/Time   NA 139 05/05/2018 0042   NA 143 10/07/2016 1409   K 3.4 (L) 05/05/2018 0042   CL 107 05/05/2018 0042   CO2 25 05/05/2018 0042   GLUCOSE 112 (H) 05/05/2018 0042   BUN 7 05/05/2018 0042   BUN  7 10/07/2016 1409   CREATININE 0.79 05/05/2018 0042   CREATININE 0.74 12/02/2015 1144   CALCIUM 8.5 (L) 05/05/2018 0042   GFRNONAA >60 05/05/2018 0042   GFRAA >60 05/05/2018 0042    Liver Function Tests Lab Results  Component Value Date   ALT 18 05/05/2018   AST 48 (H) 05/05/2018   ALKPHOS 53 05/05/2018   BILITOT 0.9 05/05/2018   Cardiac Enzymes Troponin I  Date Value Ref Range Status  05/05/2018 11.59 (HH) <0.03 ng/mL Final    Comment:    CRITICAL VALUE NOTED.  VALUE IS CONSISTENT WITH PREVIOUSLY REPORTED AND CALLED VALUE. Performed at [facility], [address], [place], [place] [address]   05/04/2018 6.96 (HH) <0.03 ng/mL Final    Comment:    CRITICAL VALUE NOTED.  VALUE IS CONSISTENT WITH PREVIOUSLY REPORTED AND CALLED VALUE. Performed at [facility], [address], [place], [place] [address]   05/04/2018 2.32 (HH) <0.03 ng/mL Final    Comment:    CRITICAL RESULT CALLED TO, READ BACK BY AND VERIFIED  WITH: N BEASLEY,RN AT 1535 05/04/2018 BY L BENFIELD Performed at [facility], [address] [place], [place] [address]    Hemoglobin A1C Lab Results  Component Value Date   HGBA1C 5.8 (H) 05/04/2018   Fasting Lipid Panel Cholesterol  Date Value Ref Range Status  05/05/2018 135 0 - 200 mg/dL Final   HDL  Date Value Ref Range Status  05/05/2018 40 (L) >40 mg/dL Final   LDL Cholesterol  Date Value Ref Range Status  05/05/2018 78 0 - 99 mg/dL Final    Comment:           Total Cholesterol/HDL:CHD Risk Coronary Heart Disease Risk Table                     Men   Women  1/2 Average Risk   3.4   3.3  Average Risk       5.0   4.4  2 X Average Risk   9.6   7.1  3 X Average Risk  23.4   11.0        Use the calculated Patient Ratio above and the CHD Risk Table to determine the patient's CHD Risk.        ATP III CLASSIFICATION (LDL):  <100     mg/dL   Optimal  570-177  mg/dL   Near or Above                    Optimal  130-159  mg/dL   Borderline  939-030  mg/dL   High  >092     mg/dL   Very High Performed at [facility], [address] [place], [place] [address]    Triglycerides  Date Value Ref Range Status  05/05/2018 85 <150 mg/dL Final    Thyroid Function Tests TSH  Date Value Ref Range Status  05/04/2018 0.435 0.350 - 4.500 uIU/mL Final    Comment:    Performed by a 3rd Generation assay with a functional sensitivity of <=0.01 uIU/mL. Performed at [facility], [address], [place], [place] [address]   05/02/2015 1.572 0.350 - 4.500 uIU/mL Final   ____________  Ct Abdomen Pelvis Wo Contrast  Result Date: 05/06/2018 CLINICAL DATA:  43 year old female with back pain. Concern for retroperitoneal bleed. EXAM: CT ABDOMEN AND PELVIS WITHOUT CONTRAST TECHNIQUE: Multidetector CT imaging of the abdomen and pelvis  was performed following the standard protocol without IV contrast. COMPARISON:  CT of the abdomen pelvis dated 04/18/2018 FINDINGS:  Evaluation of this exam is limited in the absence of intravenous contrast. Lower chest: Borderline cardiomegaly. There is coronary vascular calcification. There are bibasilar linear atelectasis/scarring. Probable mild thickening of the posterior pleural surface versus less likely trace pleural effusion on the right. No intra-abdominal free air or free fluid. Hepatobiliary: The liver is grossly unremarkable. No intrahepatic biliary ductal dilatation. The gallbladder is filled with stones. No pericholecystic fluid or evidence of acute cholecystitis by CT. Pancreas: Unremarkable. No pancreatic ductal dilatation or surrounding inflammatory changes. Spleen: Normal in size without focal abnormality. Adrenals/Urinary Tract: Adrenal glands are unremarkable. Kidneys are normal, without renal calculi, focal lesion, or hydronephrosis. Bladder is unremarkable. Stomach/Bowel: There is no bowel obstruction or active inflammation. Normal appendix. Vascular/Lymphatic: The abdominal aorta and IVC are grossly unremarkable on this noncontrast CT. No portal venous gas. There is no adenopathy. Reproductive: The uterus is anteverted and grossly unremarkable. There are 2 tubal ligation clips in the posterior pelvis. The left tubal ligation clip may have shifted. The ovaries are poorly visualized and not well evaluated. Other: None Musculoskeletal: No acute osseous pathology. Probable bone islands in the right femoral neck. IMPRESSION: 1. No acute intra-abdominal or pelvic pathology. Specifically no retroperitoneal or intraperitoneal hematoma. 2. No bowel obstruction or active inflammation. Normal appendix. 3. Cholelithiasis. Electronically Signed   By: Elgie Collard M.D.   On: 05/06/2018 01:45   Dg Chest 2 View  Result Date: 05/04/2018 CLINICAL DATA:  Upper chest pain, nausea EXAM: CHEST - 2 VIEW COMPARISON:  03/10/2018 FINDINGS: Heart is borderline in size. No confluent airspace opacities or effusions. No acute bony abnormality.  IMPRESSION: Borderline heart size.  No acute findings. Electronically Signed   By: Charlett Nose M.D.   On: 05/04/2018 08:24   Ct Renal Stone Study  Result Date: 04/18/2018 CLINICAL DATA:  Flank pain. Frequent UTIs. EXAM: CT ABDOMEN AND PELVIS WITHOUT CONTRAST TECHNIQUE: Multidetector CT imaging of the abdomen and pelvis was performed following the standard protocol without IV contrast. COMPARISON:  None. FINDINGS: LOWER CHEST: There is no basilar pleural or apical pericardial effusion. HEPATOBILIARY: The hepatic contours and density are normal. There is no intra- or extrahepatic biliary dilatation. There is cholelithiasis without acute inflammation. PANCREAS: The pancreatic parenchymal contours are normal and there is no ductal dilatation. There is no peripancreatic fluid collection. SPLEEN: Normal. ADRENALS/URINARY TRACT: --Adrenal glands: Normal. --Right kidney/ureter: No hydronephrosis, nephroureterolithiasis, perinephric stranding or solid renal mass. --Left kidney/ureter: No hydronephrosis, nephroureterolithiasis, perinephric stranding or solid renal mass. --Urinary bladder: Normal for degree of distention STOMACH/BOWEL: --Stomach/Duodenum: There is no hiatal hernia or other gastric abnormality. The duodenal course and caliber are normal. --Small bowel: No dilatation or inflammation. --Colon: No focal abnormality. --Appendix: Normal. VASCULAR/LYMPHATIC: Normal course and caliber of the major abdominal vessels. No abdominal or pelvic lymphadenopathy. REPRODUCTIVE: Normal uterus and ovaries. MUSCULOSKELETAL. No bony spinal canal stenosis or focal osseous abnormality. OTHER: None. IMPRESSION: 1. No acute abdominal or pelvic abnormality. 2. Cholelithiasis without acute inflammation. Electronically Signed   By: Deatra Robinson M.D.   On: 04/18/2018 04:30   Disposition   Pt is being discharged home today in good condition.  Follow-up Plans & Appointments    Follow-up Information    [facility]. Go to.   Why:  for discounted medications for $4-$10; discuss applying for the [facility] Card for assistance with lower cost healthcare throughout [place].  Contact information: [address] [place] [address] [phone]       Tereso Newcomer T, PA-C Follow up on 05/26/2018.   Specialties:  Cardiology, Physician Assistant Why:  Keep appt.  Contact information: [address] [place] [address] [phone]            Discharge Instructions    Amb Referral to Cardiac Rehabilitation   Complete by:  As directed    Diagnosis:  NSTEMI Comment - thrombectomy   Diet - low sodium heart healthy   Complete by:  As directed    Increase activity slowly   Complete by:  As directed       Discharge Medications   Allergies as of 05/08/2018      Reactions   Ampicillin Shortness Of Breath, Swelling   Throat swells   Penicillins Anaphylaxis, Shortness Of Breath   Throat swelling Has patient had a PCN reaction causing immediate rash, facial/tongue/throat swelling, SOB or lightheadedness with hypotension: Yes Has patient had a PCN reaction causing severe rash involving mucus membranes or skin necrosis: No Has patient had a PCN reaction that required hospitalization: No Has patient had a PCN reaction occurring within the last 10 years: Yes If all of the above answers are "NO", then may proceed with Cephalosporin use.   Strawberry Extract Anaphylaxis, Itching, Swelling   Cephalexin Hives, Itching, Swelling   Prednisone Nausea And Vomiting, Rash, Other (See Comments)   Cramping in legs, could barely walk      Medication List    STOP taking these medications   metoprolol tartrate 25 MG tablet Commonly known as:  LOPRESSOR     TAKE these medications   aspirin EC 81 MG tablet Take 1 tablet (81 mg total) by mouth daily. Take for 1 month and stop What changed:  additional instructions Notes to patient:  Take for 1  month and stop   atorvastatin 80 MG tablet Commonly known as:  LIPITOR Take 1 tablet (80 mg total) by mouth at bedtime. What changed:  when to take this   nitroGLYCERIN 0.4 MG SL tablet Commonly known as:  NITROSTAT Place 1 tablet (0.4 mg total) under the tongue every 5 (five) minutes x 3 doses as needed for chest pain.   prasugrel 10 MG Tabs tablet Commonly known as:  EFFIENT Take 1 tablet (10 mg total) by mouth daily.   rivaroxaban 20 MG Tabs tablet Commonly known as:  XARELTO Take 1 tablet (20 mg total) by mouth daily with supper. What changed:    how much to take  how to take this  when to take this  additional instructions   topiramate 50 MG tablet Commonly known as:  TOPAMAX Take 1 tablet (50 mg total) by mouth daily.        Aspirin prescribed at discharge?  Yes High Intensity Statin Prescribed? (Lipitor 40-80mg  or Crestor 20-40mg ): Yes Beta Blocker Prescribed? No: Bradycardia For EF <40%, was ACEI/ARB Prescribed? No: EF greater than 40% ADP Receptor Inhibitor Prescribed? (i.e. Plavix etc.-Includes Medically Managed Patients): Yes For EF <40%, Aldosterone Inhibitor Prescribed? No: EF greater than 40% Was EF assessed during THIS hospitalization? Yes Was Cardiac Rehab II ordered? (Included Medically managed Patients): Yes   Outstanding Labs/Studies   None  Duration of Discharge Encounter   Greater than 30 minutes including physician time.  Bobbye Riggs Sophiana Milanese NP 05/08/2018, 10:20 AM

## 2018-05-09 ENCOUNTER — Encounter: Payer: Self-pay | Admitting: Physician Assistant

## 2018-05-09 ENCOUNTER — Telehealth: Payer: Self-pay | Admitting: Hematology and Oncology

## 2018-05-09 NOTE — Telephone Encounter (Signed)
A new hem appt has been scheduled for the pt to see Dr. Pamelia Hoit on 2/5 at 10am. Pt aware to arrive 30 minutes early.

## 2018-05-09 NOTE — Telephone Encounter (Signed)
Patient contacted regarding discharge from [facility] on 05/08/18.  Patient understands to follow up with provider Tereso Newcomer, PA-c on 05/26/18 at 8:15 at [address] [facility] [address] in [place]. Patient understands discharge instructions? Yes Patient understands medications and regiment? Yes Patient understands to bring all medications to this visit? Yes

## 2018-05-15 ENCOUNTER — Telehealth: Payer: Self-pay | Admitting: Family Medicine

## 2018-05-15 ENCOUNTER — Telehealth (HOSPITAL_COMMUNITY): Payer: Self-pay

## 2018-05-15 DIAGNOSIS — M79601 Pain in right arm: Secondary | ICD-10-CM

## 2018-05-15 NOTE — Telephone Encounter (Signed)
Patient seen briefly during her daughter's well adolescent visit. Recent discharge from hospital after having acute MI, treated with emergent heart catheterization.  Reports pain in R arm, similar to prior episode of DVT in arm. Had IV in her R antecubital area, also had R radial arterial cath.  Knot palpable in proximal medial forearm soft tissue, mildly tender. No erythema or warmth.  Will order doppler ultrasound of RUE to evaluate. Patient remains on xarelto. She is otherwise doing well.  Red team, please schedule doppler ultrasound & inform patient of appointment time.  Latrelle Dodrill, MD

## 2018-05-15 NOTE — Telephone Encounter (Signed)
Called and spoke with pt in regards to CR, pt stated at this time she only has out of stated Medicaid. But she has applied for BCBS thru her job and it may take up to 30 days to see if she gets it. Stated she will give [place] a call once she receive her insurance card.   Will follow up with RN navigator.

## 2018-05-17 ENCOUNTER — Telehealth: Payer: Self-pay

## 2018-05-17 NOTE — Telephone Encounter (Signed)
Called patient and informed her of Ultrasound appointment at [facility] 05/18/2018 at 1300 hours with an arrival time of 1245. Check in with Admitting.   Patient verbally acknowledged appointment and was appreciative to Dr. Pollie Meyer.  Glennie Hawk, CMA

## 2018-05-18 ENCOUNTER — Ambulatory Visit (HOSPITAL_COMMUNITY): Payer: Medicaid - Out of State | Attending: Family Medicine

## 2018-05-24 ENCOUNTER — Ambulatory Visit: Payer: Medicaid - Out of State | Admitting: Hematology and Oncology

## 2018-05-24 ENCOUNTER — Telehealth: Payer: Self-pay | Admitting: Family Medicine

## 2018-05-24 NOTE — Telephone Encounter (Signed)
Envelope dropped off at front desk.  Verified that patient section of form has been completed.  Last DOS/WCC with PCP was 04/20/2018.  Placed form in team folder to be completed by clinical staff.  Angelica Brown  Pt stated she already talk about this letter to Dr.McIntyre.

## 2018-05-24 NOTE — Assessment & Plan Note (Signed)
Sudden onset of headaches treated initially for migraines but when she was found to have profoundly enlarged retinal veins, she underwent a CT of the head which revealed dural venous sinus thrombosis.  Hypercoagulability work-up: Positive for lupus anticoagulant on repeat testing Current treatment: Xarelto for life Xarelto toxicities: None  Return to clinic in 1 year for follow-up 

## 2018-05-24 NOTE — Telephone Encounter (Signed)
Reviewed form and placed in PCP's box for completion.   .Simpson, Michelle R, CMA  

## 2018-05-25 ENCOUNTER — Encounter: Payer: Self-pay | Admitting: Physician Assistant

## 2018-05-25 NOTE — Progress Notes (Deleted)
Cardiology Office Note:    Date:  05/25/2018   ID:  Angelica Brown, DOB Sep 16, 1975, MRN [id number]  PCP:  Latrelle Dodrill, MD  Cardiologist:  Lesleigh Noe, MD *** Electrophysiologist:  None   Referring MD: Latrelle Dodrill, MD   No chief complaint on file. ***  History of Present Illness:    Angelica Brown is a 43 y.o. female with coronary artery disease, systolic HF secondary to ischemic CM, hypercoagulable state due to antiphospholipid antibody syndrome, systemic lupus erythematosus, dural venous sinus thrombosis, prior DVT, sickle cell trait.  She has a hx of ST elevation MI in 10/2017 2/2 thrombotic occlusion of an ulcerated plaque in the RCA treated with a BMS and thrombectomy to the PDA.  Her PCI was c/b VF arrest x 2.  She then had a myocardial infarction 12/2017 due to RCA stent thrombosis tx with thrombectomy and DES (4 x 38 mm Synergy DES).  She was felt to be a Plavix non-responder and was initially changed to Ticagrelor.  But, this was switched to Prasugrel due to shortness of breath.  She is on long term anticoagulation with Rivaroxaban.    She was admitted 1/16-1/20 with an inferior MI.  She had run out of Prasugrel and Rivaroxaban 3 days prior to admission.  Cardiac Catheterization demonstrated 99% thrombotic occlusion of the RCA and total occlusion of the RPDA.  The RCA was tx with thrombectomy of the RCA resulting in 35% residual stenosis.  Long acting nitrates were DC'd secondary to low blood pressure.  HR and BP also precluded starting an ACE/ARB and beta-blocker.  Nocturnal bradycardia was noted and she will need an OP sleep study.  ***  [name] ***  Prior CV studies:   The following studies were reviewed today:  Echo 05/04/2018 Mod LVH, EF 40-45, inf-lat HK, PASP 47 (mild pul HTN)  Cardiac Catheterization 05/04/2018 RCA prox stent 99; RPDA 100 PCI:  Thrombectomy to prox RCA with 35% residual  Right coronary stent thrombosis in the  setting of thrombophilia related to antiphospholipid antibody syndrome and lupus erythematosus.  Likely precipitant was cessation of Xarelto and Effient 48 to 72 hours prior to onset of symptoms at 4 AM.  Thrombosed right coronary stent 99% with TIMI grade II flow and evidence of embolic occlusion of the mid PDA.  Post AngioJet thrombectomy significant thrombus burden removal, reduced obstruction to less than 40%, restoration of TIMI grade III flow, persistent embolus in mid to distal PDA.  Widely patent left main, LAD, and codominant circumflex.  LVEDP less than 5 mmHg at the beginning of the procedure.      Cardiac Catheterization 12/28/17 ([place]) RCA 100 PCI: 4 x 38 mm Synergy DES to RCA  Cardiac Catheterization 11/15/17 [facility])  Acute inferior wall myocardial infarction due to heavily thrombotic,  likely ulcerated subtotal proximal RCA lesion successfully treated with  single bare-metal stent with high pressure post dilation with excellent  result by OCT imaging.  Occlusion of distal posterior descending by thrombus which likely was an  embolus from the proximal RCA thrombotic lesion treated with suction  thrombectomy with minimal residual except in the very distal tip of this  vessel where there remained no flow.  Focal inferior hypokinesis with normal ejection fraction  Mild/moderate focal eccentric mid LAD stenosis and otherwise minimal  atherosclerotic CAD identified.  2 episodes of ventricular fibrillation requiring defibrillation  associated with reperfusion as well as with entering the left ventricle.  Stable rhythm at end  of procedure.  Echo 05/31/2016 Mod focal basal and mild conc LVH, EF 60-65, no RWMA, Gr 2 DD, trivial TR  Myoview 12/08/11 Normal stress nuclear study with a small, mild, fixed anterior defect suggestive of soft tissue attenuation; no ischemia. Cannot R/O left breast nodule; suggest clinical exam and mammogram.  LV Ejection Fraction: 48%.  LV Wall  Motion:  LV function appears better than calculated EF; suggest echocardiogram to better assess.  Past Medical History:  Diagnosis Date  . ACS (acute coronary syndrome) (HCC) 05/04/2018  . CHOLELITHIASIS   . Coronary artery disease   . Dermatophytosis of nail   . 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 GERD (gastroesophageal reflux disease)   . Gestational diabetes    "w/all 4 pregnancies"  . Heart murmur   . Hypertension   . Metrorrhagia   . Migraine    "at least twice/month" (05/28/2016)  . Nystagmus 06/30/2016  . OBESITY, MORBID   . OBESITY, NOS   . Pneumonia 11/2014  . POSTURAL LIGHTHEADEDNESS   . Sickle cell trait (HCC)   . STEMI (ST elevation myocardial infarction) (HCC) 11/09/2017   in [place], [place] - see records in Care Everywhere  . SYNCOPE   . Throat pain   . TOBACCO DEPENDENCE   . URI   . Urinary frequency    Surgical Hx: The patient  has a past surgical history that includes Wisdom tooth extraction (1998); Bartholin gland cyst excision (Bilateral, 2004); Excisional hemorrhoidectomy (2006); Tubal ligation (2007); LEFT HEART CATH AND CORONARY ANGIOGRAPHY (N/A, 05/04/2018); and Coronary Thrombectomy (N/A, 05/04/2018).   Current Medications: No outpatient medications have been marked as taking for the 05/26/18 encounter (Appointment) with Tereso Newcomer T, PA-C.     Allergies:   Ampicillin; Penicillins; Strawberry extract; Cephalexin; and Prednisone   Social History   Tobacco Use  . Smoking status: Current Every Day Smoker    Packs/day: 0.15    Years: 18.00    Pack years: 2.70    Types: Cigarettes  . Smokeless tobacco: Never Used  Substance Use Topics  . Alcohol use: No  . Drug use: No     Family Hx: The patient's family history includes Breast cancer in her mother; Cancer in her father; Diabetes in her father, paternal grandfather, and paternal grandmother.  ROS:   Please  see the history of present illness.    ROS All other systems reviewed and are negative.   EKGs/Labs/Other Test Reviewed:    EKG:  EKG is *** ordered today.  The ekg ordered today demonstrates ***  Recent Labs: 05/04/2018: TSH 0.435 05/05/2018: ALT 18; BUN 7; Creatinine, Ser 0.79; Potassium 3.4; Sodium 139 05/06/2018: Hemoglobin 11.4; Platelets 250   Recent Lipid Panel Lab Results  Component Value Date/Time   CHOL 135 05/05/2018 12:42 AM   TRIG 85 05/05/2018 12:42 AM   HDL 40 (L) 05/05/2018 12:42 AM   CHOLHDL 3.4 05/05/2018 12:42 AM   LDLCALC 78 05/05/2018 12:42 AM    Physical Exam:    VS:  There were no vitals taken for this visit.    Wt Readings from Last 3 Encounters:  05/08/18 (!) 324 lb 8 oz (147.2 kg)  04/20/18 (!) 330 lb 9.6 oz (150 kg)  04/17/18 (!) 330 lb (149.7 kg)     ***Physical Exam  ASSESSMENT & PLAN:    No diagnosis found.***  Dispo:  No follow-ups on file.   Medication Adjustments/Labs and Tests  Ordered: Current medicines are reviewed at length with the patient today.  Concerns regarding medicines are outlined above.  Tests Ordered: No orders of the defined types were placed in this encounter.  Medication Changes: No orders of the defined types were placed in this encounter.   Signed, Tereso Newcomer, PA-C  05/25/2018 1:15 PM    [facility] [address] [place], [place], [place]  [address] Phone: [phone]; Fax: [phone]

## 2018-05-26 ENCOUNTER — Ambulatory Visit: Payer: Medicaid - Out of State | Admitting: Physician Assistant

## 2018-05-29 ENCOUNTER — Encounter: Payer: Self-pay | Admitting: Physician Assistant

## 2018-06-02 NOTE — Telephone Encounter (Signed)
Schedule A letter written & signed. Will return to [facility] RN team. Please scan in the written request that patient sent so we have a record of it, then let patient know letter is ready.  Thanks! Latrelle Dodrill, MD

## 2018-06-05 NOTE — Telephone Encounter (Signed)
Patient aware that letter is at front desk for pick up. Copy of her request made and placed in batch scanning. Ples Specter, RN [facility] RN)

## 2018-06-15 ENCOUNTER — Telehealth: Payer: Self-pay | Admitting: *Deleted

## 2018-06-15 MED ORDER — PRASUGREL HCL 10 MG PO TABS: 10.0000 mg | ORAL_TABLET | Freq: Every day | ORAL | 0 refills | Status: DC

## 2018-06-15 NOTE — Telephone Encounter (Signed)
Pt calls with questions about her medication.  Her last medications were sent to [facility], when she left the hospital.  At the moment she has no insurance and has been out of the prasugrel (EFFIENT) 10 MG for 3 days.  She states that Dr. Pollie Meyer told her that we may have samples of some meds when she left the hospital.  Advised that I do not think this is one of them, but I will send her a message to see if she has another solution. [place], Maryjo Rochester, CMA

## 2018-06-15 NOTE — Telephone Encounter (Signed)
Called patient. Advised we do not have effient samples here but was able to locate a goodrx coupon for her that brought the cost down to 20-30 dollars, which she is able to afford. Sent in prescription for her to [facility] at her request.  She also notes that her job is sending her back to [place] for a week. Wants to know if she can fly. Advised that the safest thing would be for her to not fly since she has been out of this medication for 3 days, had a recent MI, and has not yet followed up with cardiology. Risks of hypoxia and ischemia in the air are likely too much.   Stressed to her importance of following up with cardiology. She canceled her appointment with them because she still does not have insurance in [place], only has [place]. She also canceled her arm doppler appointment because arm improved.  Patient appreciative.  Latrelle Dodrill, MD

## 2018-06-19 ENCOUNTER — Other Ambulatory Visit: Payer: Self-pay

## 2018-06-19 NOTE — Telephone Encounter (Signed)
Red team, please clarify with patient. Metoprolol is not on her medication list. Is she taking this?  Ok to dispense 4 weeks of xarelto samples to patient.   Latrelle Dodrill, MD

## 2018-06-19 NOTE — Telephone Encounter (Signed)
Patient needs refill of Topiramate and Metoprolol. Mother accidentally threw out her meds.  Also needs samples of Xarelto (will put at front desk).  Ples Specter, RN [facility] RN)

## 2018-06-20 NOTE — Telephone Encounter (Addendum)
Called patient and she was prescribed Metoprolol by her cardiologist in [place] since her last heart attack in July.  Patient takes 1/2 pill in AM to get her heart rate up.  Patient says she has no problem discontinuing it because"that will be one less pill she has to take".  Please advise.  Glennie Hawk, CMA

## 2018-06-21 MED ORDER — TOPIRAMATE 50 MG PO TABS: 50.0000 mg | ORAL_TABLET | Freq: Every day | ORAL | 0 refills | Status: DC

## 2018-06-21 MED ORDER — METOPROLOL TARTRATE 25 MG PO TABS: 12.5000 mg | ORAL_TABLET | Freq: Every day | ORAL | 0 refills | Status: DC

## 2018-06-21 NOTE — Telephone Encounter (Signed)
Called patient to clarify since metop was listed to be stopped on discharge during recent hospitalization. She has continued to take 12.5mg  of metoprolol tartrate each morning, as that is what her cardiologist in [place] wanted her to do. Patient has felt fine on this medication but does not know her recent heart rates. She was previously told not to take it at night due to having low heart rates overnight (into the 30s).  Will reorder this so as to avoid precipitating unstable angina from abrupt cessation of metoprolol. Reiterated to patient the importance of her getting in with cardiologist ASAP. Patient appreciative.  Latrelle Dodrill, MD

## 2018-07-06 ENCOUNTER — Telehealth (HOSPITAL_COMMUNITY): Payer: Self-pay

## 2018-07-06 NOTE — Telephone Encounter (Signed)
Attempted to call patient in regards to Cardiac Rehab - to let pt know we are closed at this time due to the COVID-19 and will contact once we have resume scheduling.  °LMTCB °

## 2018-07-13 ENCOUNTER — Telehealth (HOSPITAL_COMMUNITY): Payer: Self-pay

## 2018-07-13 NOTE — Telephone Encounter (Signed)
Attempted to call pt in regards to Cardiac Rehab LMTCB. Placed white card in black box Tempie Donning. Support Rep II

## 2018-09-12 ENCOUNTER — Other Ambulatory Visit: Payer: Self-pay

## 2018-09-12 ENCOUNTER — Telehealth (INDEPENDENT_AMBULATORY_CARE_PROVIDER_SITE_OTHER): Payer: Self-pay | Admitting: Family Medicine

## 2018-09-12 ENCOUNTER — Encounter: Payer: Self-pay | Admitting: Family Medicine

## 2018-09-12 ENCOUNTER — Telehealth: Payer: Self-pay | Admitting: *Deleted

## 2018-09-12 DIAGNOSIS — R053 Chronic cough: Secondary | ICD-10-CM

## 2018-09-12 DIAGNOSIS — R05 Cough: Secondary | ICD-10-CM

## 2018-09-12 DIAGNOSIS — H9190 Unspecified hearing loss, unspecified ear: Secondary | ICD-10-CM

## 2018-09-12 DIAGNOSIS — R1110 Vomiting, unspecified: Secondary | ICD-10-CM

## 2018-09-12 MED ORDER — ONDANSETRON 4 MG PO TBDP: 4.0000 mg | ORAL_TABLET | Freq: Three times a day (TID) | ORAL | 0 refills | Status: DC | PRN

## 2018-09-12 NOTE — Progress Notes (Signed)
[place] [facility] Telemedicine Visit  Patient consented to have virtual visit. Method of visit: Video was attempted, but technology challenges prevented video usage, so visit was conducted via telephone.  Encounter participants: Patient: Angelica Brown - located at home Provider: Renold Don - located at office Others (if applicable): n/a   Chief Complaint: "tickle in throat" and cough  HPI:  Patient has had concomitant "tickle" in her throat that leads to cough and post-tussive vomiting for past 2 months.  Also with dullness in Right ear.  States that this occurs anywhere from 3-5 times a day.  It is described as just bile and mucus.  No headache.  Occasional lightheadedness after vomiting.  She is otherwise eating and drinking well.  She does have history of acid reflux which is actually not controlled.  Triggered by spicy foods or spaghetti sauce which she has been avoiding.  No weight loss.  No abdominal pain or bloating.  She has been followed by ENT in the past for serous otitis.  No fevers or chills.  No cough at night.  She is sleeping through the night without vomiting.  ROS: per HPI  Pertinent PMHx: dural venous sinus thrombosis   Exam: Gen:  Patient awake, alert, fully conversant, oriented x 4 Auditory:  Hearing normal Resp:  Good strong voice.  Speaking in full sentences.  No coughing during exam.  No respiratory distress.  No audible wheezing over the phone  Psych:  Linear and coherent thought process.  No flight of ideas.    Assessment/Plan:  1.  Chronic cough: -Discussed presents to postnasal drip with patient.  However she was very resistant to this diagnosis. -She is concerned because of the degree to which she is having vomiting. -Sounds more like there is a cough that occurs because she has attempted to clear her throat and this leads to what sounds to be forced vomiting. -She needs to have an in person evaluation for ear exam. -She does  have an established otolaryngologist.  She is to call the office to schedule an appointment.  I will put in a referral but she is a known patient there so that she may not need one.  Either way her calling will likely get her seen more quickly. -If she is able to be seen there for several weeks she is to schedule a follow-up appointment with [place].  2.  Posttussive emesis: -Without fevers or chills.  She does not have fatigue or myalgias or other flulike symptoms. -This is been ongoing for several months so much less likely to be secondary to coronavirus. -Possibility of chronic GERD.  She states that she is controlling this through diet but could also be silent GERD. -However she was resistant to any PPIs or other GERD treatment to see if it would help with her cough. -No red flags such as weight loss. -May need to be evaluated by gastroenterology if the symptoms continue pending ENT referral.  Time spent on phone with patient: 35 minutes

## 2018-09-12 NOTE — Telephone Encounter (Signed)
LVM to call office back to do a pre-call prior to phone visit with doctor. Angelica Brown, CMA

## 2018-10-09 ENCOUNTER — Other Ambulatory Visit: Payer: Self-pay

## 2018-10-09 ENCOUNTER — Ambulatory Visit (INDEPENDENT_AMBULATORY_CARE_PROVIDER_SITE_OTHER): Payer: Medicaid Other | Admitting: Family Medicine

## 2018-10-09 VITALS — BP 122/80 | HR 87

## 2018-10-09 DIAGNOSIS — H9203 Otalgia, bilateral: Secondary | ICD-10-CM | POA: Diagnosis not present

## 2018-10-09 NOTE — Progress Notes (Signed)
Date of Visit: 10/09/2018   HPI:   Ear fullness: [name] is complaining of ear fullness, tinnitus, and diminishment of sound bilaterally for ~2 weeks. She denies any pain, fever, or disequilibrium. She has a PMH significant for eustachian tube dysfunction and multiple episodes of AOM, also prior episode of mastoiditis leading to venous sinus thrombosis. Saw ENT in 2018 and was recommended for adult tympanostomy tube placement.  Possible tonsil stones: She reports a 1.5 month history of gagging up/coughing up pea-sized stones that range in color from cream to "brown-greenish." She reports producing 16 of these in the past 18 hours. She denies dysphagia or pain. Feels "fullness" in her throat that causes her to gag. No fevers.   Continues to follow up with cardiology in [place], [place]. Is going there about 1 week a month for her job. Drives there, does not fly. Recent echo with improved EF. Compliant with brilinta and xarelto.  ROS: See HPI.  [place]:  history of morbid obesity, sickle cell trait, antiphospholipid antibody syndrome, prior MI w/ systolic CHF  PHYSICAL EXAM: BP 122/80   Pulse 87   SpO2 96%  Gen: Well appearing. No distress.  HEENT: Ears: External ears normal bilaterally. Faint erythema of R ear canal. L ear canal normal. Tympanic membranes not erythematous and non-bulging.  Some fluid visible behind TMs but does not appear infected. Throat: Uvula midline, oropharynx clear and moist, mucous membranes normal. Tonsils enlarged but not kissing. Possible tonsillolith seen in L tonsil  ASSESSMENT/PLAN:  Ear fullness- consistent with ear effusions, no signs of acute infection. Given her history of recurrent effusions and infections, we will make a referral to ENT to discuss the possibility of adult tympanostomy tube placement.   Possible tonsil stones- Clinical picture is consistent with tonsilloliths. Referring to ENT for management.   [place]  Student  Patient seen along with MS1 student Pearla Dubonnet. I personally evaluated this patient along with the student, and verified all aspects of the history, physical exam, and medical decision making as documented by the student. I agree with the student's documentation and have made all necessary edits.  Chrisandra Netters, MD [place]

## 2018-10-09 NOTE — Patient Instructions (Signed)
Put in referral for you to see an ENT doctor Someone will call you with an appointment, you can also try to call and schedule it  Be well, Dr. Ardelia Mems

## 2018-10-09 NOTE — Progress Notes (Signed)
error 

## 2018-10-12 DIAGNOSIS — R0989 Other specified symptoms and signs involving the circulatory and respiratory systems: Secondary | ICD-10-CM | POA: Diagnosis not present

## 2018-10-12 DIAGNOSIS — K219 Gastro-esophageal reflux disease without esophagitis: Secondary | ICD-10-CM | POA: Diagnosis not present

## 2018-11-07 ENCOUNTER — Telehealth: Payer: Self-pay | Admitting: *Deleted

## 2018-11-07 ENCOUNTER — Other Ambulatory Visit: Payer: Self-pay | Admitting: Family Medicine

## 2018-11-07 MED ORDER — NITROGLYCERIN 0.4 MG SL SUBL: 0.4000 mg | SUBLINGUAL_TABLET | SUBLINGUAL | 12 refills | Status: DC | PRN

## 2018-11-07 NOTE — Telephone Encounter (Signed)
Received call from pt stating that her blood clotting disorder is becoming more aggressive.  Pt states she is currently taking effient, xaralto, and daily aspirin.  Pt states her ENT advised her to follow up with hematology.  Per Dr. Lindi Adie, schedule pt for in office visit to discuss symptoms and treatment.  Apt scheduled and pt verbalized understanding.

## 2018-11-07 NOTE — Telephone Encounter (Signed)
Called patient back, needs refill for nitroglycerin. Has not used in some years and needs for travels. No recent CP or DOE. Refill sent to pharmacy.  Dorris Singh, MD  Family Medicine Teaching Service

## 2018-11-07 NOTE — Telephone Encounter (Signed)
Pt called because she misplaced her nitroGLYCERIN. Please give pt a call back.

## 2018-11-08 ENCOUNTER — Other Ambulatory Visit: Payer: Self-pay

## 2018-11-08 ENCOUNTER — Inpatient Hospital Stay: Payer: Medicaid Other | Attending: Hematology and Oncology | Admitting: Hematology and Oncology

## 2018-11-08 DIAGNOSIS — I2699 Other pulmonary embolism without acute cor pulmonale: Secondary | ICD-10-CM

## 2018-11-08 DIAGNOSIS — G08 Intracranial and intraspinal phlebitis and thrombophlebitis: Secondary | ICD-10-CM

## 2018-11-08 DIAGNOSIS — Z7901 Long term (current) use of anticoagulants: Secondary | ICD-10-CM | POA: Insufficient documentation

## 2018-11-08 DIAGNOSIS — Z7982 Long term (current) use of aspirin: Secondary | ICD-10-CM | POA: Diagnosis not present

## 2018-11-08 DIAGNOSIS — I219 Acute myocardial infarction, unspecified: Secondary | ICD-10-CM | POA: Diagnosis not present

## 2018-11-08 DIAGNOSIS — Z79899 Other long term (current) drug therapy: Secondary | ICD-10-CM | POA: Insufficient documentation

## 2018-11-08 MED ORDER — APIXABAN 5 MG PO TABS: 5.0000 mg | ORAL_TABLET | Freq: Two times a day (BID) | ORAL | 11 refills | Status: DC

## 2018-11-08 NOTE — Assessment & Plan Note (Signed)
Sudden onset of headaches treated initially for migraines but when she was found to have profoundly enlarged retinal veins, she underwent a CT of the head which revealed dural venous sinus thrombosis.  Hypercoagulability work-up: Positive for lupus anticoagulant on repeat testing Current treatment: Xarelto for life Xarelto toxicities: None  Return to clinic in 1 year for follow-up

## 2018-11-08 NOTE — Progress Notes (Signed)
Patient Care Team: Latrelle Dodrill, MD as PCP - General (Pediatrics) Lyn Records, MD as PCP - Cardiology (Cardiology)  DIAGNOSIS:  Encounter Diagnosis  Name Primary?   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  She went to [place] for job-related purposes.  There she developed for blood clots in the lungs as well as 3 episodes of myocardial infarction.  She underwent the last procedure for the heart in [place].  She has had episodes of sudden chest pain dizziness or lightheadedness.  She does know she has been taking Xarelto religiously and has not missed any doses.  REVIEW OF SYSTEMS:   Constitutional: Denies fevers, chills or abnormal weight loss Eyes: Denies blurriness of vision Ears, nose, mouth, throat, and face: Denies mucositis or sore throat Respiratory: Denies cough, dyspnea or wheezes Cardiovascular: Denies palpitation, chest discomfort Gastrointestinal:  Denies nausea, heartburn or change in bowel habits Skin: Denies abnormal skin rashes Lymphatics: Denies new lymphadenopathy or easy bruising Neurological:Denies numbness, tingling or new weaknesses Behavioral/Psych: Mood is stable, no new changes  Extremities: No lower extremity edema   All other systems were reviewed with the patient and are negative.  I have reviewed the past medical history, past surgical history, social history and family history with the patient and they are unchanged from previous note.  ALLERGIES:  is allergic to ampicillin; penicillins; strawberry extract; cephalexin; and prednisone.  MEDICATIONS:  Current Outpatient Medications  Medication Sig Dispense Refill   apixaban (ELIQUIS)  5 MG TABS tablet Take 1 tablet (5 mg total) by mouth 2 (two) times daily. 60 tablet 11   aspirin EC 81 MG tablet Take 1 tablet (81 mg total) by mouth daily. Take for 1 month and stop     atorvastatin (LIPITOR) 80 MG tablet Take 1 tablet (80 mg total) by mouth at bedtime. (Patient taking differently: Take 80 mg by mouth daily. ) 90 tablet 3   metoprolol tartrate (LOPRESSOR) 25 MG tablet Take 0.5 tablets (12.5 mg total) by mouth daily with breakfast. 45 tablet 0   nitroGLYCERIN (NITROSTAT) 0.4 MG SL tablet Place 1 tablet (0.4 mg total) under the tongue every 5 (five) minutes x 3 doses as needed for chest pain. 25 tablet 12   ondansetron (ZOFRAN ODT) 4 MG disintegrating tablet Take 1 tablet (4 mg total) by mouth every 8 (eight) hours as needed for nausea or vomiting. 20 tablet 0   prasugrel (EFFIENT) 10 MG TABS tablet Take 1 tablet (10 mg total) by mouth daily. 90 each 0   topiramate (TOPAMAX) 50 MG tablet Take 1 tablet (50 mg total) by mouth daily. 90 tablet 0   No current facility-administered medications for this visit.     PHYSICAL EXAMINATION: ECOG PERFORMANCE STATUS: 1 - Symptomatic but completely ambulatory  Vitals:   11/08/18 1444  BP: 131/79  Pulse: 72  Resp: 18  Temp: 98.9 F (37.2 C)  SpO2: 99%   Filed Weights   11/08/18 1444  Weight: (!) 338 lb 9.6 oz (153.6 kg)    GENERAL:alert, no distress and comfortable SKIN: skin color, texture, turgor are normal, no rashes or significant lesions EYES: normal, Conjunctiva are pink and non-injected, sclera clear OROPHARYNX:no exudate, no erythema and lips, buccal mucosa, and tongue normal  NECK: supple, thyroid normal size, non-tender, without nodularity LYMPH:  no palpable lymphadenopathy in the cervical, axillary or inguinal LUNGS: clear to auscultation and percussion with normal breathing effort HEART: regular rate & rhythm and no murmurs and no lower extremity edema ABDOMEN:abdomen soft, non-tender and normal bowel  sounds MUSCULOSKELETAL:no cyanosis of digits and no clubbing  NEURO: alert & oriented x 3 with fluent speech, no focal motor/sensory deficits EXTREMITIES: No lower extremity edema  LABORATORY DATA:  I have reviewed the data as listed CMP Latest Ref Rng & Units 05/05/2018 05/04/2018 04/17/2018  Glucose 70 - 99 mg/dL 112(H) 115(H) 98  BUN 6 - 20 mg/dL 7 11 9   Creatinine 0.44 - 1.00 mg/dL 0.79 0.70 0.91  Sodium 135 - 145 mmol/L 139 140 140  Potassium 3.5 - 5.1 mmol/L 3.4(L) 3.5 3.5  Chloride 98 - 111 mmol/L 107 106 108  CO2 22 - 32 mmol/L 25 23 23   Calcium 8.9 - 10.3 mg/dL 8.5(L) 9.0 9.1  Total Protein 6.5 - 8.1 g/dL 6.3(L) - -  Total Bilirubin 0.3 - 1.2 mg/dL 0.9 - -  Alkaline Phos 38 - 126 U/L 53 - -  AST 15 - 41 U/L 48(H) - -  ALT 0 - 44 U/L 18 - -    Lab Results  Component Value Date   WBC 14.6 (H) 05/06/2018   HGB 11.4 (L) 05/06/2018   HCT 33.1 (L) 05/06/2018   MCV 80.7 05/06/2018   PLT 250 05/06/2018   NEUTROABS 6.0 07/01/2016    ASSESSMENT & PLAN:  Dural venous sinus thrombosis Sudden onset of headaches treated initially for migraines but when she was found to have profoundly enlarged retinal veins, she underwent a CT of the head which revealed dural venous sinus thrombosis.  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   I agree with antiplatelet therapy in addition to anticoagulation.  I instructed her to take aspirin 81 mg daily.  Return to clinic in 6 months for follow-up    No orders of the defined types were placed in this encounter.  The patient has a good understanding of the overall plan. she agrees with it. she will call with any problems that may develop before the next visit here.   Harriette Ohara, MD 11/08/18

## 2018-11-09 ENCOUNTER — Telehealth: Payer: Self-pay | Admitting: Hematology and Oncology

## 2018-11-09 NOTE — Telephone Encounter (Signed)
I left a message regarding schedule  

## 2018-11-13 ENCOUNTER — Ambulatory Visit (INDEPENDENT_AMBULATORY_CARE_PROVIDER_SITE_OTHER): Payer: Medicaid Other | Admitting: Family Medicine

## 2018-11-13 ENCOUNTER — Other Ambulatory Visit: Payer: Self-pay

## 2018-11-13 VITALS — BP 118/80 | HR 65 | Temp 98.4°F | Wt 340.0 lb

## 2018-11-13 DIAGNOSIS — M545 Low back pain, unspecified: Secondary | ICD-10-CM

## 2018-11-13 DIAGNOSIS — R109 Unspecified abdominal pain: Secondary | ICD-10-CM | POA: Diagnosis not present

## 2018-11-13 LAB — POCT UA - MICROSCOPIC ONLY

## 2018-11-13 LAB — POCT URINALYSIS DIP (MANUAL ENTRY)
Bilirubin, UA: NEGATIVE
Glucose, UA: NEGATIVE mg/dL
Ketones, POC UA: NEGATIVE mg/dL
Leukocytes, UA: NEGATIVE
Nitrite, UA: NEGATIVE
Protein Ur, POC: NEGATIVE mg/dL
Spec Grav, UA: 1.02 (ref 1.010–1.025)
Urobilinogen, UA: 0.2 E.U./dL
pH, UA: 6.5 (ref 5.0–8.0)

## 2018-11-13 MED ORDER — TAMSULOSIN HCL 0.4 MG PO CAPS: 0.4000 mg | ORAL_CAPSULE | Freq: Every day | ORAL | 0 refills | Status: DC

## 2018-11-13 NOTE — Progress Notes (Signed)
   HPI 43 year old the presents for left-sided flank pain.  Patient says this started a few days ago.  She states that the pain is a vague pain that feels like it is "on the inside".  She did not try anything for the pain.  States that she has had a few family members recently with kidney stones and they had similar symptoms.  Patient states that sleeping on the affected side does not increase her pain.  Fact is actually helped her pain some.  States that she just "really cannot get comfortable at rest".  She has had no fevers.  No urinary incontinence.  Not noticed any blood in the urine.  CC: Left leg pain   ROS:   Review of Systems See HPI for ROS.   CC, SH/smoking status, and VS noted  Objective: BP 118/80   Pulse 65   Temp 98.4 F (36.9 C) (Oral)   Wt (!) 340 lb (154.2 kg)   LMP 10/23/2018   SpO2 95%   BMI 50.21 kg/m  Gen: 43 year old African-American female, no acute stress, resting comfortably CV: RRR, no murmur Resp: CTAB, no wheezes, non-labored Abd: SNTND, BS present, no guarding or organomegaly Neuro: Alert and oriented, Speech clear, No gross deficits Left flank: No tenderness to palpation.  No real tenderness with flexion or extension of back.  No tenderness with torso rotation.  Sitting makes the pain worse.  While standing appears to have improved it some.  UA: Trace blood.  Epithelial cells, blood seen on high-power field.  Assessment and plan:  Left flank pain Patient's history, physical exam, and urinalysis are all suspicious for a kidney stone.  There is trace blood in the urine and on high-powered field.  Very unlikely to be a urinary tract infection, although we will get urine culture to make sure.  Strong family history of kidney stones.  Patient admits that she has not been drinking very much water recently.  Encourage patient to drink plenty of water over the next couple days.  We will give her Flomax 0.4 mg daily to see if can accomplish some ureteral  sphincter dilation.  Patient to follow-up with the next week or 2 if pain persist.   Orders Placed This Encounter  Procedures  . Urine Culture  . POCT urinalysis dipstick  . POCT UA - Microscopic Only    Meds ordered this encounter  Medications  . tamsulosin (FLOMAX) 0.4 MG CAPS capsule    Sig: Take 1 capsule (0.4 mg total) by mouth daily.    Dispense:  30 capsule    Refill:  0     Guadalupe Dawn MD PGY-3 Family Medicine Resident  11/14/2018 11:36 AM

## 2018-11-13 NOTE — Patient Instructions (Addendum)
I am sorry but have as much back pain.  Your urine showed trace blood cells.  This can be consistent with a kidney stone especially in the place for your back pain is at.  Also you have a very strong family history of these as well.  I highly recommend you hydrate very well over the next day or 2.  Also a medication called Flomax which can help relax your ureters and hopefully help you pass the stone.  In regards to your urine.  We will also get a urine culture as a new your urinary tract infection sometimes present like this.  I will give you a call and the culture comes back.

## 2018-11-14 DIAGNOSIS — R109 Unspecified abdominal pain: Secondary | ICD-10-CM | POA: Insufficient documentation

## 2018-11-14 NOTE — Assessment & Plan Note (Signed)
Patient's history, physical exam, and urinalysis are all suspicious for a kidney stone.  There is trace blood in the urine and on high-powered field.  Very unlikely to be a urinary tract infection, although we will get urine culture to make sure.  Strong family history of kidney stones.  Patient admits that she has not been drinking very much water recently.  Encourage patient to drink plenty of water over the next couple days.  We will give her Flomax 0.4 mg daily to see if can accomplish some ureteral sphincter dilation.  Patient to follow-up with the next week or 2 if pain persist.

## 2018-11-16 LAB — URINE CULTURE

## 2018-11-17 ENCOUNTER — Telehealth (INDEPENDENT_AMBULATORY_CARE_PROVIDER_SITE_OTHER): Payer: Medicaid Other | Admitting: Family Medicine

## 2018-11-17 ENCOUNTER — Other Ambulatory Visit: Payer: Self-pay

## 2018-11-17 ENCOUNTER — Emergency Department (HOSPITAL_COMMUNITY)
Admission: EM | Admit: 2018-11-17 | Discharge: 2018-11-18 | Disposition: A | Discharge status: Home / Self Care | Payer: Medicaid Other | Attending: Emergency Medicine | Admitting: Emergency Medicine

## 2018-11-17 ENCOUNTER — Emergency Department (HOSPITAL_COMMUNITY): Payer: Medicaid Other

## 2018-11-17 DIAGNOSIS — K8020 Calculus of gallbladder without cholecystitis without obstruction: Secondary | ICD-10-CM | POA: Diagnosis not present

## 2018-11-17 DIAGNOSIS — N12 Tubulo-interstitial nephritis, not specified as acute or chronic: Secondary | ICD-10-CM

## 2018-11-17 DIAGNOSIS — Z7901 Long term (current) use of anticoagulants: Secondary | ICD-10-CM | POA: Diagnosis not present

## 2018-11-17 DIAGNOSIS — I252 Old myocardial infarction: Secondary | ICD-10-CM | POA: Diagnosis not present

## 2018-11-17 DIAGNOSIS — Z79899 Other long term (current) drug therapy: Secondary | ICD-10-CM | POA: Diagnosis not present

## 2018-11-17 DIAGNOSIS — Z7982 Long term (current) use of aspirin: Secondary | ICD-10-CM | POA: Insufficient documentation

## 2018-11-17 DIAGNOSIS — R1032 Left lower quadrant pain: Secondary | ICD-10-CM | POA: Diagnosis present

## 2018-11-17 DIAGNOSIS — F17210 Nicotine dependence, cigarettes, uncomplicated: Secondary | ICD-10-CM | POA: Insufficient documentation

## 2018-11-17 DIAGNOSIS — N3000 Acute cystitis without hematuria: Secondary | ICD-10-CM | POA: Diagnosis not present

## 2018-11-17 DIAGNOSIS — M545 Low back pain, unspecified: Secondary | ICD-10-CM

## 2018-11-17 DIAGNOSIS — I5022 Chronic systolic (congestive) heart failure: Secondary | ICD-10-CM | POA: Insufficient documentation

## 2018-11-17 DIAGNOSIS — R1110 Vomiting, unspecified: Secondary | ICD-10-CM | POA: Diagnosis not present

## 2018-11-17 DIAGNOSIS — M3210 Systemic lupus erythematosus, organ or system involvement unspecified: Secondary | ICD-10-CM | POA: Insufficient documentation

## 2018-11-17 DIAGNOSIS — I10 Essential (primary) hypertension: Secondary | ICD-10-CM | POA: Insufficient documentation

## 2018-11-17 DIAGNOSIS — R109 Unspecified abdominal pain: Secondary | ICD-10-CM | POA: Diagnosis not present

## 2018-11-17 LAB — I-STAT BETA HCG BLOOD, ED (MC, WL, AP ONLY): I-stat hCG, quantitative: <5 m[IU]/mL (ref <5)

## 2018-11-17 LAB — URINALYSIS, ROUTINE W REFLEX MICROSCOPIC
Bilirubin Urine: NEGATIVE
Glucose, UA: NEGATIVE mg/dL
Hgb urine dipstick: NEGATIVE
Ketones, ur: NEGATIVE mg/dL
Nitrite: NEGATIVE
Protein, ur: 30 mg/dL — AB
Specific Gravity, Urine: 1.028 (ref 1.005–1.030)
pH: 6 (ref 5.0–8.0)

## 2018-11-17 LAB — BASIC METABOLIC PANEL
Anion gap: 10 (ref 5–15)
BUN: 9 mg/dL (ref 6–20)
CO2: 26 mmol/L (ref 22–32)
Calcium: 9.3 mg/dL (ref 8.9–10.3)
Chloride: 103 mmol/L (ref 98–111)
Creatinine, Ser: 0.96 mg/dL (ref 0.44–1.00)
GFR calc Af Amer: >60 mL/min (ref >60)
GFR calc non Af Amer: >60 mL/min (ref >60)
Glucose, Bld: 86 mg/dL (ref 70–99)
Potassium: 3.5 mmol/L (ref 3.5–5.1)
Sodium: 139 mmol/L (ref 135–145)

## 2018-11-17 LAB — CBC WITH DIFFERENTIAL/PLATELET
Abs Immature Granulocytes: 0.08 10*3/uL — ABNORMAL HIGH (ref 0.00–0.07)
Basophils Absolute: 0.1 10*3/uL (ref 0.0–0.1)
Basophils Relative: 0 %
Eosinophils Absolute: 0.2 10*3/uL (ref 0.0–0.5)
Eosinophils Relative: 1 %
HCT: 38.8 % (ref 36.0–46.0)
Hemoglobin: 13.8 g/dL (ref 12.0–15.0)
Immature Granulocytes: 1 %
Lymphocytes Relative: 27 %
Lymphs Abs: 4.2 10*3/uL — ABNORMAL HIGH (ref 0.7–4.0)
MCH: 28.9 pg (ref 26.0–34.0)
MCHC: 35.6 g/dL (ref 30.0–36.0)
MCV: 81.3 fL (ref 80.0–100.0)
Monocytes Absolute: 1.4 10*3/uL — ABNORMAL HIGH (ref 0.1–1.0)
Monocytes Relative: 9 %
Neutro Abs: 9.3 10*3/uL — ABNORMAL HIGH (ref 1.7–7.7)
Neutrophils Relative %: 62 %
Platelets: 328 10*3/uL (ref 150–400)
RBC: 4.77 MIL/uL (ref 3.87–5.11)
RDW: 14.6 % (ref 11.5–15.5)
WBC: 15.3 10*3/uL — ABNORMAL HIGH (ref 4.0–10.5)
nRBC: 0 % (ref 0.0–0.2)

## 2018-11-17 MED ORDER — ONDANSETRON HCL 4 MG/2ML IJ SOLN
4.0000 mg | Freq: Once | INTRAMUSCULAR | Status: AC
  Administered 2018-11-17: 4 mg via INTRAVENOUS
  Filled 2018-11-17: qty 2

## 2018-11-17 MED ORDER — KETOROLAC TROMETHAMINE 30 MG/ML IJ SOLN
15.0000 mg | Freq: Once | INTRAMUSCULAR | Status: AC
  Administered 2018-11-17: 15 mg via INTRAVENOUS
  Filled 2018-11-17: qty 1

## 2018-11-17 NOTE — Assessment & Plan Note (Signed)
Symptoms concerning for possible pyelonephritis.  Pain is worsening and patient states she "feels crummy".  States that she is not sure if she has any fevers.  Reports continued vomiting.  Giving worsening condition as well as continued vomiting I am concerned that patient has a pyelonephritis.  Given that patient is worsening in condition I do recommend possible IV antibiotics.  Patient also continues to vomit so will likely benefit from IV fluids.  Patient will need to be seen in person so that vital signs can be obtained and we can ensure patient is not septic.  I discussed this case with Dr. Josiah Lobo who agrees and recommended that patient go to the emergency department.  Discussed this with patient.  Patient agreeable to go to the emergency department as she is hoping this will make her feel better.  Strict return precautions given.  Follow-up with [facility] after emergency department visit.

## 2018-11-17 NOTE — ED Notes (Signed)
Pt is aware she needs a urine sample 

## 2018-11-17 NOTE — Progress Notes (Signed)
[facility] Telemedicine Visit I connected with  Angelica Brown on 11/17/18 by a video enabled telemedicine application and verified that I am speaking with the correct person using two identifiers.   I discussed the limitations of evaluation and management by telemedicine. The patient expressed understanding and agreed to proceed.  Patient consented to have virtual visit. Method of visit: Telephone  Encounter participants: Patient: Angelica Brown - located at Home Provider: Caroline More - located at [facility] Others (if applicable): None  Chief Complaint: worsening back pain  HPI: Back pain Patient reports she came in earlier this week with 4 days of mid back pain (L>R). Patient also had continued vomiting which worsened back pain. Recent visit on 11/13/18 with Dr. Kris Mouton, at that time pain suspicious for kidney stone. RX for flomax given and told to f/u in 2 weeks. Urine culture was positive for proteus mirabilis. Patient reports she feels worse. Reports no improvement. Patient reports increased urination but no dysuria. Reports continued vomiting. Denies fever but has not checked.  Overall feels "crummy".. Denies hematuria. Does endorse urgency. Continues to have back pain, L>R. Reports pain as a 20/10, cannot sleep due to pain. Nothing makes it better. Has tried ice, heat, warm showers, positional changes without success.   ROS: per HPI  Pertinent PMHx: ACS Strong family history of kidney stones   Exam:  Respiratory: speaking full sentences, no increased WOB  Assessment/Plan:  Pyelonephritis Symptoms concerning for possible pyelonephritis.  Pain is worsening and patient states she "feels crummy".  States that she is not sure if she has any fevers.  Reports continued vomiting.  Giving worsening condition as well as continued vomiting I am concerned that patient has a pyelonephritis.  Given that patient is worsening in condition I do recommend possible  IV antibiotics.  Patient also continues to vomit so will likely benefit from IV fluids.  Patient will need to be seen in person so that vital signs can be obtained and we can ensure patient is not septic.  I discussed this case with Dr. Josiah Lobo who agrees and recommended that patient go to the emergency department.  Discussed this with patient.  Patient agreeable to go to the emergency department as she is hoping this will make her feel better.  Strict return precautions given.  Follow-up with [facility] after emergency department visit.    Time spent during visit with patient: 15 minutes

## 2018-11-17 NOTE — ED Triage Notes (Signed)
Per pt she has been seen today by pcp and was sent here today bc of severe kidney infection and a kidney stone. Pt sais she was sent for iv medications No fevers no chills

## 2018-11-17 NOTE — ED Provider Notes (Signed)
[facility] Provider Note   CSN: [id number] Arrival date & time: 11/17/18  1934    History   Chief Complaint Chief Complaint  Patient presents with   Flank Pain   Back Pain   Urinary Tract Infection    HPI Angelica Brown is a 43 y.o. female has no history of cholelithiasis, CHF, CAD, dural venous sinus thrombosis, GERD, hypertension, sickle cell trait who presents for evaluation of 10 days of lower back pain (left greater than right).  She states that initially when symptoms began, she thought that this was due to musculoskeletal pain.  She reports that she had slept in a weird position the night before.  She states that she was evaluated by her PCP  who thought that this may be a kidney stone due to presence of blood in urine.  She was prescribed a prescription for Flomax which she states she has been compliant with.  She had a telehealth visit today because she was continuing to have pain as well as some increased urinary urgency.  She states that she has been taking Tylenol with no improvement in pain.  Patient also reports that she has had vomiting but states that this is been an ongoing issue that she has had for several months.  She states that she will occasionally have clear vomit she describes more of a slight spit up.  This got worse with her new symptoms but is not a new problem.  She has not noted any urinary frequency or dysuria.  She states that her pain does not radiate to her abdomen.  She has not noted any fevers but has had some subjective chills at home.  Patient denies any fevers, chest pain, difficulty breathing.  She has no known history of kidney stones but does state her mom and her son both have them.     The history is provided by the patient.    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    acute dural venous sinus thrombosis and right mastoid effusion/notes 05/28/2016   GERD (gastroesophageal reflux disease)    Gestational diabetes    "w/all 4 pregnancies"   Heart murmur    Hypertension    Metrorrhagia    Migraine    "at least twice/month" (05/28/2016)   Nystagmus 06/30/2016   OBESITY, MORBID    OBESITY, NOS    Pneumonia 11/2014   POSTURAL LIGHTHEADEDNESS    Sickle cell trait (HCC)    STEMI (ST elevation myocardial infarction) (HCC) 11/09/2017   [place] see records in Care Everywhere   SYNCOPE    Throat pain    TOBACCO DEPENDENCE    URI    Urinary frequency     Patient Active Problem List   Diagnosis Date Noted   Pyelonephritis 11/17/2018   Left flank pain 11/14/2018   ACS (acute coronary syndrome) (HCC) 05/04/2018   Hx of Inf MI (7/19 Tx with BMS to RCA; stent thrombosis 9/19 tx with DES to RCA; stent thrombosis 04/2018 tx with thrombectomy) 05/04/2018   SLE (systemic lupus erythematosus related syndrome) (HCC) 04/20/2018  Bradycardia 04/20/2018   History of MI (myocardial infarction) 04/20/2018   Antiphospholipid antibody syndrome (HCC) 11/01/2016   Chronic diarrhea 10/27/2016   Seborrheic dermatitis 10/27/2016   Nystagmus 06/30/2016   Chronic systolic CHF (congestive heart failure) (HCC) 06/13/2016   Acute stress reaction 06/09/2016   DVT of upper extremity (deep vein thrombosis) (HCC) 06/09/2016   Superficial venous thrombosis of right upper extremity    Sinus bradycardia    Mastoiditis of right side 05/29/2016   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   Papilledema of both eyes    Bacterial meningitis    Headache 05/26/2016   Onychomycosis 04/16/2013   Family history of breast  cancer 01/17/2013   Sickle cell trait (HCC)    Vertigo 08/29/2008   OBESITY, MORBID 07/10/2008   SYNCOPE 10/24/2006   CHOLELITHIASIS 07/13/2006   TOBACCO DEPENDENCE 06/16/2006   METRORRHAGIA 06/16/2006    Past Surgical History:  Procedure Laterality Date   BARTHOLIN GLAND CYST EXCISION Bilateral 2004   CORONARY THROMBECTOMY N/A 05/04/2018   Procedure: Coronary Thrombectomy;  Surgeon: Lyn RecordsSmith, Henry W, MD;  Location: [facility];  Service: Cardiovascular;  Laterality: N/A;   EXCISIONAL HEMORRHOIDECTOMY  2006   "removed a blood clot from one"   LEFT HEART CATH AND CORONARY ANGIOGRAPHY N/A 05/04/2018   Procedure: LEFT HEART CATH AND CORONARY ANGIOGRAPHY;  Surgeon: Lyn RecordsSmith, Henry W, MD;  Location: [facility];  Service: Cardiovascular;  Laterality: N/A;   TUBAL LIGATION  2007   WISDOM TOOTH EXTRACTION  1998     OB History    Gravida  5   Para  5   Term  4   Preterm  1   AB      Living  4     SAB      TAB      Ectopic      Multiple  1   Live Births               Home Medications    Prior to Admission medications   Medication Sig Start Date End Date Taking? Authorizing Provider  apixaban (ELIQUIS) 5 MG TABS tablet Take 1 tablet (5 mg total) by mouth 2 (two) times daily. 11/08/18   Serena CroissantGudena, Vinay, MD  aspirin EC 81 MG tablet Take 1 tablet (81 mg total) by mouth daily. Take for 1 month and stop 05/08/18   Barrett, Joline Salthonda G, PA-C  atorvastatin (LIPITOR) 80 MG tablet Take 1 tablet (80 mg total) by mouth at bedtime. Patient taking differently: Take 80 mg by mouth daily.  04/20/18   Latrelle DodrillMcIntyre, Brittany J, MD  HYDROcodone-acetaminophen (NORCO/VICODIN) 5-325 MG tablet Take 1-2 tablets by mouth every 6 (six) hours as needed. 11/18/18   Maxwell CaulLayden, Bladimir Auman A, PA-C  metoprolol tartrate (LOPRESSOR) 25 MG tablet Take 0.5 tablets (12.5 mg total) by mouth daily with breakfast. 06/21/18   Latrelle DodrillMcIntyre, Brittany J, MD  nitroGLYCERIN (NITROSTAT) 0.4 MG SL tablet Place 1  tablet (0.4 mg total) under the tongue every 5 (five) minutes x 3 doses as needed for chest pain. 11/07/18   Westley ChandlerBrown, Carina M, MD  ondansetron (ZOFRAN ODT) 4 MG disintegrating tablet Take 1 tablet (4 mg total) by mouth every 8 (eight) hours as needed for nausea or vomiting. 09/12/18   Tobey GrimWalden, Jeffrey H, MD  prasugrel (EFFIENT) 10 MG TABS tablet Take 1 tablet (10 mg total) by mouth daily. 06/15/18   Latrelle DodrillMcIntyre, Brittany J, MD  sulfamethoxazole-trimethoprim (BACTRIM DS) 800-160 MG tablet Take 1 tablet by mouth  2 (two) times daily for 7 days. 11/18/18 11/25/18  Maxwell Caul, PA-C  tamsulosin (FLOMAX) 0.4 MG CAPS capsule Take 1 capsule (0.4 mg total) by mouth daily. 11/13/18   Myrene Buddy, MD  topiramate (TOPAMAX) 50 MG tablet Take 1 tablet (50 mg total) by mouth daily. 06/21/18   Latrelle Dodrill, MD    Family History Family History  Problem Relation Age of Onset   Breast cancer Mother    Cancer Father        MELANOMA   Diabetes Father    Diabetes Paternal Grandmother    Diabetes Paternal Grandfather     Social History Social History   Tobacco Use   Smoking status: Current Every Day Smoker    Packs/day: 0.15    Years: 18.00    Pack years: 2.70    Types: Cigarettes   Smokeless tobacco: Never Used  Substance Use Topics   Alcohol use: No   Drug use: No     Allergies   Ampicillin, Penicillins, Strawberry extract, Cephalexin, and Prednisone   Review of Systems Review of Systems  Constitutional: Positive for chills. Negative for fever.  Respiratory: Negative for cough and shortness of breath.   Cardiovascular: Negative for chest pain.  Gastrointestinal: Positive for vomiting. Negative for abdominal pain and nausea.  Genitourinary: Positive for flank pain, frequency and hematuria. Negative for dysuria.  Musculoskeletal: Positive for back pain.  Neurological: Negative for headaches.  All other systems reviewed and are negative.    Physical Exam Updated Vital  Signs BP 92/62    Pulse 67    Temp 98.2 F (36.8 C) (Oral)    Resp 18    LMP 10/23/2018    SpO2 94%   Physical Exam Vitals signs and nursing note reviewed.  Constitutional:      Appearance: Normal appearance. She is well-developed.  HENT:     Head: Normocephalic and atraumatic.  Eyes:     General: Lids are normal.     Conjunctiva/sclera: Conjunctivae normal.     Pupils: Pupils are equal, round, and reactive to light.  Neck:     Musculoskeletal: Full passive range of motion without pain.  Cardiovascular:     Rate and Rhythm: Normal rate and regular rhythm.     Pulses: Normal pulses.          Radial pulses are 2+ on the right side and 2+ on the left side.       Dorsalis pedis pulses are 2+ on the right side and 2+ on the left side.     Heart sounds: Normal heart sounds. No murmur. No friction rub. No gallop.   Pulmonary:     Effort: Pulmonary effort is normal.     Breath sounds: Normal breath sounds.     Comments: Lungs clear to auscultation bilaterally.  Symmetric chest rise.  No wheezing, rales, rhonchi. Abdominal:     Palpations: Abdomen is soft. Abdomen is not rigid.     Tenderness: There is no abdominal tenderness. There is right CVA tenderness and left CVA tenderness. There is no guarding.     Comments: Abdomen is soft, nondistended.  No tenderness noted to abdomen.  Bilateral CVA tenderness, left greater than right.  Musculoskeletal: Normal range of motion.       Back:     Comments: Diffuse lower lumbar tenderness that extends over bilateral sides into the paraspinal muscles bilaterally.  Skin:    General: Skin is warm and dry.     Capillary Refill:  Capillary refill takes less than 2 seconds.  Neurological:     Mental Status: She is alert and oriented to person, place, and time.  Psychiatric:        Speech: Speech normal.      ED Treatments / Results  Labs (all labs ordered are listed, but only abnormal results are displayed) Labs Reviewed  URINALYSIS, ROUTINE W  REFLEX MICROSCOPIC - Abnormal; Notable for the following components:      Result Value   APPearance HAZY (*)    Protein, ur 30 (*)    Leukocytes,Ua TRACE (*)    Bacteria, UA RARE (*)    All other components within normal limits  CBC WITH DIFFERENTIAL/PLATELET - Abnormal; Notable for the following components:   WBC 15.3 (*)    Neutro Abs 9.3 (*)    Lymphs Abs 4.2 (*)    Monocytes Absolute 1.4 (*)    Abs Immature Granulocytes 0.08 (*)    All other components within normal limits  BASIC METABOLIC PANEL  I-STAT BETA HCG BLOOD, ED ([facility], [facility], [facility] ONLY)    EKG None  Radiology Ct Renal Stone Study  Result Date: 11/17/2018 CLINICAL DATA:  Flank pain left greater than right EXAM: CT ABDOMEN AND PELVIS WITHOUT CONTRAST TECHNIQUE: Multidetector CT imaging of the abdomen and pelvis was performed following the standard protocol without IV contrast. COMPARISON:  May 06, 2018 FINDINGS: Lower chest: The visualized heart size within normal limits. No pericardial fluid/thickening. No hiatal hernia. There is linear scarring or atelectasis seen at the right lung base. Hepatobiliary: Although limited due to the lack of intravenous contrast, normal in appearance without gross focal abnormality. Multiple calcified gallstones are present within the gallbladder. No pericholecystic free fluid is seen. Pancreas:  Unremarkable.  No surrounding inflammatory changes. Spleen: Normal in size. Although limited due to the lack of intravenous contrast, normal in appearance. Adrenals/Urinary Tract: Both adrenal glands appear normal. The kidneys and collecting system appear normal without evidence of urinary tract calculus or hydronephrosis. Bladder is unremarkable. Stomach/Bowel: The stomach, small bowel, and colon are normal in appearance. No inflammatory changes or obstructive findings. appendix is normal. Vascular/Lymphatic: There are no enlarged abdominal or pelvic lymph nodes. No significant gross vascular findings are  present. Reproductive: The uterus is unremarkable. Tubal ligation surgical clips are present in the posterior pelvis which are unchanged in appearance since the prior exam.0. Other: No evidence of abdominal wall mass or hernia. Musculoskeletal: No acute or significant osseous findings. Sclerotic foci seen within the right proximal femur, likely bone islands. IMPRESSION: 1. No renal or collecting system calculi. 2. No acute intra-abdominal pathology to explain the patient's symptoms 3. Cholelithiasis Electronically Signed   By: Prudencio Pair M.D.   On: 11/17/2018 22:07    Procedures Procedures (including critical care time)  Medications Ordered in ED Medications  ketorolac (TORADOL) 30 MG/ML injection 15 mg (15 mg Intravenous Given 11/17/18 2225)  ondansetron (ZOFRAN) injection 4 mg (4 mg Intravenous Given 11/17/18 2224)  sulfamethoxazole-trimethoprim (BACTRIM DS) 800-160 MG per tablet 1 tablet (1 tablet Oral Given 11/18/18 0028)     Initial Impression / Assessment and Plan / ED Course  I have reviewed the triage vital signs and the nursing notes.  Pertinent labs & imaging results that were available during my care of the patient were reviewed by me and considered in my medical decision making (see chart for details).        43 year old female who presents for evaluation of 10 days of flank pain,  left greater than right as well as some increased urinary frequency.  She also has had some vomiting but states that this is been an ongoing issue for several months.  Saw PCP onset of symptoms and was concerned that this may be a kidney stone due to hematuria in urine.  Saw telehealth today who recommended that she go to the emergency department for further evaluate evaluation given that she was having worsening symptoms. Patient is afebrile, non-toxic appearing, sitting comfortably on examination table. Vital signs reviewed and stable.  On exam, she has bilateral flank pain, left greater than right.   Abdomen benign.  Concern for kidney stone versus UTI versus pyelonephritis.  Also consider musculoskeletal pain.  History/physical exam not concerning for ACS etiology, aortic dissection.  Do not suspect renal infarct.  Plan to check labs, urine.  Review of records show that her telehealth visit showed a urine culture that was positive for Proteus mirabilis.   I-stat beta negative.  BMP shows normal BUN and creatinine.  CBC shows leukocytosis of 15.3.  Hemoglobin is stable.  UA shows trace leukocytes.  No evidence of hematuria.  Given her continued pain, will plan for CT renal study for evaluation of kidney stone.  CT renal study showed no evidence of kidney stones.  Otherwise unremarkable.  Discussed results with patient.  Vital signs are stable.  She does report some minor improvement in pain but is still having pain.  She is driving herself home and therefore does not want anything that makes herself sleepy.  Reevaluation shows no abdominal tenderness.  At this time, there are no findings of pyelonephritis on her CT scan question if this is musculoskeletal pain versus pyelonephritis.  Her urine did grow out Proteus on her urine culture so we will plan to treat.  Additionally, there was concerned that she was not tolerating p.o.  I discussed with patient at length with regards to her vomiting.  It sounds like more she is spitting up clear fluids but this is been an ongoing issue for several months and is not new or related to her symptoms of back pain.  She has not had any active vomiting here in the ED and her vital signs are stable.  At this time, will plan to treat her urine culture results.  I reviewed her sensitivities and she is sensitive to the Keflex but she has allergies.  History/physical exam not concerning for dissection, ACS etiology, pelvic etiology.  I reviewed her urine culture results with pharmacist.  They recommend starting her on Bactrim DS twice daily.  Review of patient records.   She has tolerated this before without any issues.  Pharmacist assured that there is will not cause any interaction with her blood thinners.  Discussed plan with patient.  She is agreeable.  Will give short course of pain medication.  At this time, her work-up is reassuring.  Her vital signs are stable.  She has not had any vomiting here in the ED.  At this time, I feel that she is amenable to try outpatient antibiotics.  I did discuss at length with patient and offered admission but patient declined.  She would rather try p.o. antibiotics and monitor her symptoms.  I discussed with her regarding close follow-up with her primary care doctor and instructed her to call in 2 days. Discussed patient with Dr. Denton Lank who is agreeable to plan. Patient had ample opportunity for questions and discussion. All patient's questions were answered with full understanding.  Strict return  precautions discussed. Patient expresses understanding and agreement to plan.   Portions of this note were generated with Scientist, clinical (histocompatibility and immunogenetics)Dragon dictation software. Dictation errors may occur despite best attempts at proofreading.   Final Clinical Impressions(s) / ED Diagnoses   Final diagnoses:  Acute bilateral low back pain, unspecified whether sciatica present  Acute cystitis without hematuria    ED Discharge Orders         Ordered    HYDROcodone-acetaminophen (NORCO/VICODIN) 5-325 MG tablet  Every 6 hours PRN     11/18/18 0028    sulfamethoxazole-trimethoprim (BACTRIM DS) 800-160 MG tablet  2 times daily     11/18/18 0028           Maxwell CaulLayden, Geovanni Rahming A, PA-C 11/18/18 0036    Cathren LaineSteinl, Kevin, MD 11/18/18 [phone]

## 2018-11-18 MED ORDER — HYDROCODONE-ACETAMINOPHEN 5-325 MG PO TABS: 1.0000 | ORAL_TABLET | Freq: Four times a day (QID) | ORAL | 0 refills | Status: DC | PRN

## 2018-11-18 MED ORDER — SULFAMETHOXAZOLE-TRIMETHOPRIM 800-160 MG PO TABS
1.0000 | ORAL_TABLET | Freq: Once | ORAL | Status: AC
  Administered 2018-11-18: 1 via ORAL
  Filled 2018-11-18: qty 1

## 2018-11-18 MED ORDER — SULFAMETHOXAZOLE-TRIMETHOPRIM 800-160 MG PO TABS: 1.0000 | ORAL_TABLET | Freq: Two times a day (BID) | ORAL | 0 refills | Status: AC

## 2018-11-18 NOTE — Discharge Instructions (Signed)
Take antibiotics as directed. Please take all of your antibiotics until finished.  You can take 1000 mg of Tylenol.  Do not exceed 4000 mg of Tylenol a day.  Take pain medications as directed for break through pain. Do not drive or operate machinery while taking this medication.   As we discussed, please follow-up with your primary care doctor.  Call their office and arrange for an appointment.  Return the emergency department for any worsening pain, fevers, inability to eat or drink anything or any other worsening or concerning symptoms.

## 2018-11-20 ENCOUNTER — Telehealth: Payer: Self-pay | Admitting: Family Medicine

## 2018-11-20 NOTE — Telephone Encounter (Signed)
Pt called to see if PCP could give her a call regarding hospital admission. Please give pt a call back.

## 2018-11-20 NOTE — Telephone Encounter (Signed)
Called patient & discussed. Being treated with PO bactrim for urine cx that grew proteus Back pain is not any better. Located on L lower back. Significant pain in L back when she lays on her R side. ED note says she was tender to palpation along paraspinal muscles Will have her come in tomorrow AM on ATC pool with Dr. Ky Barban to reassess. I am precepting in the morning and will hopefully be able to stop in and speak with her. Suspect may be musculoskeletal - pulled muscle? Appointment scheduled for tomorrow AM Patient appreciative  Leeanne Rio, MD

## 2018-11-21 ENCOUNTER — Ambulatory Visit (INDEPENDENT_AMBULATORY_CARE_PROVIDER_SITE_OTHER): Payer: Medicaid Other | Admitting: Family Medicine

## 2018-11-21 ENCOUNTER — Ambulatory Visit: Payer: Medicaid Other

## 2018-11-21 ENCOUNTER — Other Ambulatory Visit: Payer: Self-pay

## 2018-11-21 VITALS — BP 128/66 | HR 88

## 2018-11-21 DIAGNOSIS — M545 Low back pain, unspecified: Secondary | ICD-10-CM

## 2018-11-21 MED ORDER — BACLOFEN 10 MG PO TABS: 10.0000 mg | ORAL_TABLET | Freq: Three times a day (TID) | ORAL | 0 refills | Status: DC

## 2018-11-21 NOTE — Assessment & Plan Note (Signed)
Pain is ongoing for about 1 week given negative CT scan and treatment of UTI, I believe this narrows the differential to musculoskeletal.  Hard to get adequate pain control as patient is only on Tylenol and cannot take NSAIDs due to anticoagulation.  Patient resistant to taking said dating medications.  Will trial baclofen as this is muscle relaxer that safe in the elderly and tends to be less sedating.  Also place referral to physical therapy.  Patient follow-up if not having any improvement.

## 2018-11-21 NOTE — Progress Notes (Signed)
    Subjective:  Angelica Brown is a 43 y.o. female who presents to the [facility] today with a chief complaint of back pain.   HPI:  Patient had about 1 week of back pain.  Has been seen by our office and in the emergency department.  She has had minimal relief.  Patient takes Tylenol for pain relief this helps some.  She has had full work-up which did show urine culture with Proteus.  Patient being treated with Keflex.  Patient had CT that did not show any renal stones or other spinal abnormalities.  Patient has been trying to avoid sedating medications including hydrocodone which she was prescribed for back pain.  Patient denies any ongoing dysuria, hematuria, fevers, nausea, vomiting.  She describes the back pain is predominantly on the left side.  Patient takes Xarelto and cannot take NSAIDs.  Denies bowel or bladder dysfunction.  ROS: Per HPI   Objective:  Physical Exam: BP 128/66   Pulse 88   LMP 10/23/2018   SpO2 95%   Gen: NAD, resting comfortably CV: RRR with no murmurs appreciated Pulm: NWOB,  GI:  Soft, Nontender, Nondistended. MSK: Left lower back tender to palpation, negative bilateral straight leg test, normal strength in bilateral lower extremities.  Normal sensation in lower, able to get up to the exam table without any issue extremities.   Skin: warm, dry Neuro: grossly normal, moves all extremities Psych: Normal affect and thought content  No results found for this or any previous visit (from the past 72 hour(s)).   Assessment/Plan:  Acute left-sided low back pain without sciatica Pain is ongoing for about 1 week given negative CT scan and treatment of UTI, I believe this narrows the differential to musculoskeletal.  Hard to get adequate pain control as patient is only on Tylenol and cannot take NSAIDs due to anticoagulation.  Patient resistant to taking said dating medications.  Will trial baclofen as this is muscle relaxer that safe in the elderly and tends to be  less sedating.  Also place referral to physical therapy.  Patient follow-up if not having any improvement.   Lab Orders  No laboratory test(s) ordered today    Meds ordered this encounter  Medications  . baclofen (LIORESAL) 10 MG tablet    Sig: Take 1 tablet (10 mg total) by mouth 3 (three) times daily.    Dispense:  30 each    Refill:  0      Marny Lowenstein, MD, MS FAMILY MEDICINE RESIDENT - PGY3 11/21/2018 2:56 PM

## 2018-11-21 NOTE — Patient Instructions (Signed)
It was a pleasure to see you today! Thank you for choosing [facility] for your primary care. Angelica Brown was seen for back pain.   Appears it is musculoskeletal nature.  Continue take the Tylenol.  We are adding a medicine called baclofen as a muscle relaxer.  This can make you drowsy but tends to be among the least like that.  We are also referring you to physical therapy.  Please come back if you have not had any improvement.    Best,  Marny Lowenstein, MD, MS FAMILY MEDICINE RESIDENT - PGY3 11/21/2018 2:49 PM

## 2018-11-24 ENCOUNTER — Ambulatory Visit: Payer: Medicaid Other | Admitting: Physical Therapy

## 2018-12-11 ENCOUNTER — Telehealth: Payer: Self-pay | Admitting: *Deleted

## 2018-12-11 NOTE — Telephone Encounter (Signed)
Received message from pt stating issues with pharmacy and her prescription.  Not other information left.  Attempt x1 to return call, no answer, LVM to call office.

## 2019-01-02 DIAGNOSIS — D6851 Activated protein C resistance: Secondary | ICD-10-CM | POA: Diagnosis not present

## 2019-01-02 DIAGNOSIS — J358 Other chronic diseases of tonsils and adenoids: Secondary | ICD-10-CM | POA: Diagnosis not present

## 2019-01-10 ENCOUNTER — Telehealth: Payer: Self-pay | Admitting: Hematology and Oncology

## 2019-01-10 ENCOUNTER — Telehealth: Payer: Self-pay | Admitting: Interventional Cardiology

## 2019-01-10 NOTE — Telephone Encounter (Signed)
Where is his number??

## 2019-01-10 NOTE — Telephone Encounter (Signed)
° ° °  Dr Redmond Baseman from [facility], requesting to speak only with Dr Tamala Julian regarding patient. Advised Dr Redmond Baseman , Dr Tamala Julian not in clinic today, would send message

## 2019-01-10 NOTE — Telephone Encounter (Signed)
I discussed her case with Dr. Redmond Baseman who plans to perform tonsillectomy for her.  She has been experiencing a lot of problems related to these tonsils.  Because she is on anticoagulation, her risks of bleeding are high.  Postoperatively she cannot be on anticoagulation for 2 weeks according to Dr. Redmond Baseman.  The risk of bleeding is apparently worse after the first week of surgery. Given the fact that she has lupus anticoagulant, patient is at a high risk for recurrent blood clots.  However because of the severity of her symptoms, we will proceed with discontinuation of blood thinners 3 days prior to surgery and Lovenox injections until the day of surgery and not resume anticoagulation for 2 weeks after surgery. I left a message on her phone to call us back so that we can discuss this plan.

## 2019-01-10 NOTE — Telephone Encounter (Signed)
Dr. Melida Quitter [phone]  See Dr. Nicholas Lose Hematology note from today 01/10/19 re: anticoagulants and upcoming tonsillectomy.   Dr. Lelon Huh has been following her Cardiology out of [place]... Dr. Tamala Julian did heart cath 05/04/18... pt did not show up for post hosp 05/2018 visit with Richardson Dopp PA.   Triage nurses have tried to get in touch with Dr. Redmond Baseman... will forward to Dr. Tamala Julian.

## 2019-01-11 NOTE — Telephone Encounter (Signed)
Spoke to Dr. Redmond Baseman.

## 2019-01-11 NOTE — Telephone Encounter (Signed)
Spoke to Dr. Redmond Baseman. Patient still being followed virtually by Cardiologist in [place]. May want to check with them concerning antiplatelet and anticoagulation prior to tonsillectomy.. High risk for RCA stent thrombosis. Has had 2 prior episodes.

## 2019-01-15 DIAGNOSIS — Z20828 Contact with and (suspected) exposure to other viral communicable diseases: Secondary | ICD-10-CM | POA: Diagnosis not present

## 2019-03-16 ENCOUNTER — Other Ambulatory Visit: Payer: Self-pay

## 2019-03-16 ENCOUNTER — Emergency Department (HOSPITAL_BASED_OUTPATIENT_CLINIC_OR_DEPARTMENT_OTHER)
Admit: 2019-03-16 | Discharge: 2019-03-16 | Disposition: A | Discharge status: Home / Self Care | Payer: Medicaid Other | Attending: Emergency Medicine | Admitting: Emergency Medicine

## 2019-03-16 ENCOUNTER — Emergency Department (HOSPITAL_COMMUNITY)
Admission: EM | Admit: 2019-03-16 | Discharge: 2019-03-16 | Disposition: A | Discharge status: Home / Self Care | Payer: Medicaid Other | Attending: Emergency Medicine | Admitting: Emergency Medicine

## 2019-03-16 DIAGNOSIS — I110 Hypertensive heart disease with heart failure: Secondary | ICD-10-CM | POA: Diagnosis not present

## 2019-03-16 DIAGNOSIS — Z7901 Long term (current) use of anticoagulants: Secondary | ICD-10-CM | POA: Insufficient documentation

## 2019-03-16 DIAGNOSIS — M79605 Pain in left leg: Secondary | ICD-10-CM

## 2019-03-16 DIAGNOSIS — F17210 Nicotine dependence, cigarettes, uncomplicated: Secondary | ICD-10-CM | POA: Insufficient documentation

## 2019-03-16 DIAGNOSIS — R609 Edema, unspecified: Secondary | ICD-10-CM

## 2019-03-16 DIAGNOSIS — Z79899 Other long term (current) drug therapy: Secondary | ICD-10-CM | POA: Diagnosis not present

## 2019-03-16 DIAGNOSIS — I252 Old myocardial infarction: Secondary | ICD-10-CM | POA: Insufficient documentation

## 2019-03-16 DIAGNOSIS — I5022 Chronic systolic (congestive) heart failure: Secondary | ICD-10-CM | POA: Diagnosis not present

## 2019-03-16 MED ORDER — HYDROCODONE-ACETAMINOPHEN 5-325 MG PO TABS
1.0000 | ORAL_TABLET | Freq: Once | ORAL | Status: AC
  Administered 2019-03-16: 1 via ORAL
  Filled 2019-03-16: qty 1

## 2019-03-16 MED ORDER — CYCLOBENZAPRINE HCL 10 MG PO TABS: 10.0000 mg | ORAL_TABLET | Freq: Two times a day (BID) | ORAL | 0 refills | Status: DC | PRN

## 2019-03-16 NOTE — ED Notes (Signed)
Vascular at bedside

## 2019-03-16 NOTE — ED Triage Notes (Addendum)
Pt here with c/o lower left leg swelling and pain x3 days. Pt statses she was diagnosed with a blood clotting disorder (Antiphospholipid antibody syndrome).

## 2019-03-16 NOTE — ED Provider Notes (Signed)
[facility] EMERGENCY DEPARTMENT Provider Note   CSN: [id number] Arrival date & time: 03/16/19  1703     History   Chief Complaint Chief Complaint  Patient presents with   Leg Pain    HPI Angelica Brown is a 43 y.o. female with past medical history significant for CHF, CAD, dural venous sinus thrombosis, GERD, hypertension, sickle cell trait presents to emergency department today with chief complaint of left leg swelling x3 days.  She reports associated pain that she describes as a throbbing sensation in her calf.  She states the pain is constant.  She rates it 10 out of 10 in severity. She has taken hot showers and applied heat to leg which provides temporary pain relief.  She denies any fall or known injury, fever, numbness, weakness, motor or sensory function changes.   She is on Xarelto and reports compliance. Her hematologist is Dr. Pamelia Hoit.    Past Medical History:  Diagnosis Date   ACS (acute coronary syndrome) (HCC) 05/04/2018   CHOLELITHIASIS    Chronic systolic CHF (congestive heart failure) (HCC) 06/13/2016   Ischemic CM (inf MI 05/2018) >> Echo 05/04/2018 - Mod LVH, EF 40-45, inf-lat HK, PASP 47 (mild pul HTN)   Coronary artery disease    Dermatophytosis of nail    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    acute dural venous sinus thrombosis and right mastoid effusion/notes 05/28/2016   GERD (gastroesophageal reflux disease)    Gestational diabetes    "w/all 4 pregnancies"   Heart murmur    Hypertension    Metrorrhagia    Migraine    "at least twice/month" (05/28/2016)   Nystagmus 06/30/2016   OBESITY, MORBID    OBESITY, NOS    Pneumonia 11/2014   POSTURAL LIGHTHEADEDNESS    Sickle cell trait (HCC)    STEMI (ST elevation myocardial infarction) (HCC) 11/09/2017   in [place], [place] - see records in Care Everywhere   SYNCOPE    Throat pain    TOBACCO DEPENDENCE    URI    Urinary frequency     Patient Active  Problem List   Diagnosis Date Noted   Acute left-sided low back pain without sciatica 11/21/2018   Pyelonephritis 11/17/2018   Left flank pain 11/14/2018   ACS (acute coronary syndrome) (HCC) 05/04/2018   Hx of Inf MI (7/19 Tx with BMS to RCA; stent thrombosis 9/19 tx with DES to RCA; stent thrombosis 04/2018 tx with thrombectomy) 05/04/2018   SLE (systemic lupus erythematosus related syndrome) (HCC) 04/20/2018   Bradycardia 04/20/2018   History of MI (myocardial infarction) 04/20/2018   Antiphospholipid antibody syndrome (HCC) 11/01/2016   Chronic diarrhea 10/27/2016   Seborrheic dermatitis 10/27/2016   Nystagmus 06/30/2016   Chronic systolic CHF (congestive heart failure) (HCC) 06/13/2016   Acute stress reaction 06/09/2016   DVT of upper extremity (deep vein thrombosis) (HCC) 06/09/2016   Superficial venous thrombosis of right upper extremity    Sinus bradycardia    Mastoiditis of right side 05/29/2016   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   Papilledema of both eyes    Bacterial meningitis    Headache 05/26/2016   Onychomycosis 04/16/2013   Family history of breast cancer 01/17/2013   Sickle cell trait (HCC)    Vertigo 08/29/2008   OBESITY, MORBID 07/10/2008   SYNCOPE 10/24/2006   CHOLELITHIASIS 07/13/2006   TOBACCO DEPENDENCE 06/16/2006   METRORRHAGIA 06/16/2006    Past Surgical History:  Procedure Laterality Date   BARTHOLIN GLAND CYST EXCISION  Bilateral 2004   CORONARY THROMBECTOMY N/A 05/04/2018   Procedure: Coronary Thrombectomy;  Surgeon: Belva Crome, MD;  Location: [place] [facility];  Service: Cardiovascular;  Laterality: N/A;   EXCISIONAL HEMORRHOIDECTOMY  2006   "removed a blood clot from one"   LEFT HEART CATH AND CORONARY ANGIOGRAPHY N/A 05/04/2018   Procedure: LEFT HEART CATH AND CORONARY ANGIOGRAPHY;  Surgeon: Belva Crome, MD;  Location: [place] [facility];  Service: Cardiovascular;  Laterality: N/A;   TUBAL  LIGATION  2007   WISDOM TOOTH EXTRACTION  1998     OB History    Gravida  5   Para  5   Term  4   Preterm  1   AB      Living  4     SAB      TAB      Ectopic      Multiple  1   Live Births               Home Medications    Prior to Admission medications   Medication Sig Start Date End Date Taking? Authorizing Provider  acetaminophen (TYLENOL) 500 MG tablet Take 500-1,000 mg by mouth every 6 (six) hours as needed for mild pain or headache.   Yes [provider]  atorvastatin (LIPITOR) 80 MG tablet Take 1 tablet (80 mg total) by mouth at bedtime. Patient taking differently: Take 80 mg by mouth daily.  04/20/18  Yes Leeanne Rio, MD  metoprolol tartrate (LOPRESSOR) 25 MG tablet Take 0.5 tablets (12.5 mg total) by mouth daily with breakfast. Patient taking differently: Take 25 mg by mouth daily with breakfast.  06/21/18  Yes Leeanne Rio, MD  nitroGLYCERIN (NITROSTAT) 0.4 MG SL tablet Place 1 tablet (0.4 mg total) under the tongue every 5 (five) minutes x 3 doses as needed for chest pain. 11/07/18  Yes Martyn Malay, MD  omeprazole (PRILOSEC) 40 MG capsule Take 40 mg by mouth 2 (two) times daily as needed (for GERD-like symptoms).    Yes [provider]  prasugrel (EFFIENT) 10 MG TABS tablet Take 1 tablet (10 mg total) by mouth daily. 06/15/18  Yes Leeanne Rio, MD  rivaroxaban (XARELTO) 20 MG TABS tablet Take 20 mg by mouth daily.   Yes [provider]  topiramate (TOPAMAX) 50 MG tablet Take 1 tablet (50 mg total) by mouth daily. 06/21/18  Yes Leeanne Rio, MD  apixaban (ELIQUIS) 5 MG TABS tablet Take 1 tablet (5 mg total) by mouth 2 (two) times daily. Patient not taking: Reported on 03/16/2019 11/08/18   Nicholas Lose, MD  aspirin EC 81 MG tablet Take 1 tablet (81 mg total) by mouth daily. Take for 1 month and stop Patient not taking: Reported on 03/16/2019 05/08/18   Barrett, Evelene Croon, PA-C  baclofen (LIORESAL) 10  MG tablet Take 1 tablet (10 mg total) by mouth 3 (three) times daily. Patient not taking: Reported on 03/16/2019 11/21/18   Bonnita Hollow, MD  cyclobenzaprine (FLEXERIL) 10 MG tablet Take 1 tablet (10 mg total) by mouth 2 (two) times daily as needed for muscle spasms. 03/16/19   Cherylene Ferrufino E, PA-C  HYDROcodone-acetaminophen (NORCO/VICODIN) 5-325 MG tablet Take 1-2 tablets by mouth every 6 (six) hours as needed. Patient not taking: Reported on 03/16/2019 11/18/18   Providence Lanius A, PA-C  ondansetron (ZOFRAN ODT) 4 MG disintegrating tablet Take 1 tablet (4 mg total) by mouth every 8 (eight) hours as needed  for nausea or vomiting. Patient not taking: Reported on 03/16/2019 09/12/18   Tobey Grim, MD  tamsulosin (FLOMAX) 0.4 MG CAPS capsule Take 1 capsule (0.4 mg total) by mouth daily. Patient not taking: Reported on 03/16/2019 11/13/18   Myrene Buddy, MD    Family History Family History  Problem Relation Age of Onset   Breast cancer Mother    Cancer Father        MELANOMA   Diabetes Father    Diabetes Paternal Grandmother    Diabetes Paternal Grandfather     Social History Social History   Tobacco Use   Smoking status: Current Every Day Smoker    Packs/day: 0.15    Years: 18.00    Pack years: 2.70    Types: Cigarettes   Smokeless tobacco: Never Used  Substance Use Topics   Alcohol use: No   Drug use: No     Allergies   Ampicillin, Penicillins, Strawberry extract, Cephalexin, and Prednisone   Review of Systems Review of Systems All other systems are reviewed and are negative for acute change except as noted in the HPI.   Physical Exam Updated Vital Signs BP 123/68    Pulse 72    Temp 98.4 F (36.9 C) (Oral)    Resp 18    LMP 02/26/2019    SpO2 100%   Physical Exam Vitals signs and nursing note reviewed.  Constitutional:      General: She is not in acute distress.    Appearance: She is not ill-appearing.  HENT:     Head: Normocephalic and  atraumatic.     Right Ear: Tympanic membrane and external ear normal.     Left Ear: Tympanic membrane and external ear normal.     Nose: Nose normal.     Mouth/Throat:     Mouth: Mucous membranes are moist.     Pharynx: Oropharynx is clear.  Eyes:     General: No scleral icterus.       Right eye: No discharge.        Left eye: No discharge.     Extraocular Movements: Extraocular movements intact.     Conjunctiva/sclera: Conjunctivae normal.     Pupils: Pupils are equal, round, and reactive to light.  Neck:     Musculoskeletal: Normal range of motion.     Vascular: No JVD.  Cardiovascular:     Rate and Rhythm: Normal rate and regular rhythm.     Pulses: Normal pulses.          Radial pulses are 2+ on the right side and 2+ on the left side.       Dorsalis pedis pulses are 2+ on the right side and 2+ on the left side.     Heart sounds: Normal heart sounds.  Pulmonary:     Comments: Lungs clear to auscultation in all fields. Symmetric chest rise. No wheezing, rales, or rhonchi. Abdominal:     Comments: Abdomen is soft, non-distended, and non-tender in all quadrants. No rigidity, no guarding. No peritoneal signs.  Musculoskeletal: Normal range of motion.     Comments: Very mild swelling noted to left lower extremity. Tenderness to palpation of left calf with reproducible pain. No bony tenderness of left knee. Full ROM of left hip, knee, ankle. Wiggles all toes. Left foot is not dusky in appearance. Sensation intact. Brisk cap refill. Neurovascularly intact distally. Compartments soft above and below affected joint.  No overlying wound, erythema, or edema to left leg.  Skin:  General: Skin is warm and dry.     Capillary Refill: Capillary refill takes less than 2 seconds.  Neurological:     Mental Status: She is oriented to person, place, and time.     GCS: GCS eye subscore is 4. GCS verbal subscore is 5. GCS motor subscore is 6.     Comments: Fluent speech, no facial droop.    Psychiatric:        Behavior: Behavior normal.      ED Treatments / Results  Labs (all labs ordered are listed, but only abnormal results are displayed) Labs Reviewed - No data to display  EKG None  Radiology Vas [place] Lower Extremity Venous (dvt) (only [facility])  Result Date: 03/16/2019  Lower Venous Study Indications: Edema.  Risk Factors: Antiphospholipid antibody syndrome. Limitations: Body habitus, poor ultrasound/tissue interface and patient pain tolerance. Comparison Study: No prior studies. Performing Technologist: Chanda BusingGregory Collins RVT  Examination Guidelines: A complete evaluation includes B-mode imaging, spectral Doppler, color Doppler, and power Doppler as needed of all accessible portions of each vessel. Bilateral testing is considered an integral part of a complete examination. Limited examinations for reoccurring indications may be performed as noted.  +-----+---------------+---------+-----------+----------+--------------+  RIGHT Compressibility Phasicity Spontaneity Properties Thrombus Aging  +-----+---------------+---------+-----------+----------+--------------+  CFV   Full            Yes       Yes                                    +-----+---------------+---------+-----------+----------+--------------+   +---------+---------------+---------+-----------+----------+--------------+  LEFT      Compressibility Phasicity Spontaneity Properties Thrombus Aging  +---------+---------------+---------+-----------+----------+--------------+  CFV       Full            Yes       Yes                                    +---------+---------------+---------+-----------+----------+--------------+  SFJ       Full                                                             +---------+---------------+---------+-----------+----------+--------------+  FV Prox   Full                                                             +---------+---------------+---------+-----------+----------+--------------+  FV Mid     Full                                                             +---------+---------------+---------+-----------+----------+--------------+  FV Distal                 Yes       Yes                                    +---------+---------------+---------+-----------+----------+--------------+  PFV       Full                                                             +---------+---------------+---------+-----------+----------+--------------+  POP       Full            Yes       Yes                                    +---------+---------------+---------+-----------+----------+--------------+  PTV       Full                                                             +---------+---------------+---------+-----------+----------+--------------+  PERO      Full                                                             +---------+---------------+---------+-----------+----------+--------------+     Summary: Right: No evidence of common femoral vein obstruction. Left: There is no evidence of deep vein thrombosis in the lower extremity. However, portions of this examination were limited- see technologist comments above. No cystic structure found in the popliteal fossa.  *See table(s) above for measurements and observations.    Preliminary     Procedures Procedures (including critical care time)  Medications Ordered in ED Medications  HYDROcodone-acetaminophen (NORCO/VICODIN) 5-325 MG per tablet 1 tablet (1 tablet Oral Given 03/16/19 1958)     Initial Impression / Assessment and Plan / ED Course  I have reviewed the triage vital signs and the nursing notes.  Pertinent labs & imaging results that were available during my care of the patient were reviewed by me and considered in my medical decision making (see chart for details).   Patient presents to the ED with complaints of pain and swelling to the left lower extremity without injury. Pt on xarelto and has not missed any doses. Exam without obvious  deformity or open wounds. ROM intact. Tender to palpation of left calf with very mild swelling compared to right leg. NVI distally. DVT study is negative. Will recommend symptomatic treatment.  I discussed results, treatment plan, need for follow-up with pcp if symptoms persist, and return precautions with the patient. Provided opportunity for questions, patient confirmed understanding and are in agreement with plan. Findings and plan of care discussed with supervising physician Dr. Stevie Kern.   Portions of this note were generated with Scientist, clinical (histocompatibility and immunogenetics). Dictation errors may occur despite best attempts at proofreading.   Final Clinical Impressions(s) / ED Diagnoses   Final diagnoses:  Left leg pain    ED Discharge Orders         Ordered    cyclobenzaprine (FLEXERIL) 10 MG tablet  2 times daily PRN     03/16/19 2005  Sherene Sireslbrizze, Amandine Covino E, PA-C 03/17/19 1739    Milagros Lollykstra, Richard S, MD 03/19/19 1153

## 2019-03-16 NOTE — Progress Notes (Signed)
Left lower extremity venous duplex has been completed. Preliminary results can be found in CV Proc through chart review.  Results were given to [facility] PA.  03/16/19 7:03 PM Carlos Levering RVT

## 2019-03-16 NOTE — ED Notes (Signed)
Patient verbalizes understanding of discharge instructions. Opportunity for questioning and answers were provided. Armband removed by staff, pt discharged from ED ambulatory to home.  

## 2019-03-16 NOTE — Discharge Instructions (Addendum)
You have been seen today for leg pain. Please read and follow all provided instructions. Return to the emergency room for worsening condition or new concerning symptoms including fever, shortness of breath, chest pain or difficulty breathing.  Your ultrasound today did not show a blood clot in your left leg.  1. Medications:  Prescription sent to your pharmacy for Flexeril.  This is a muscle relaxer.  Please take as prescribed.  Most he will take his medicine before bed as it can make you drowsy.  You cannot drive or work after taking this medication.  Continue usual home medications Take medications as prescribed. Please review all of the medicines and only take them if you do not have an allergy to them.   2. Treatment: rest, drink plenty of fluids  3. Follow Up:  Please follow up with primary care provider by scheduling an appointment as soon as possible for a visit     It is also a possibility that you have an allergic reaction to any of the medicines that you have been prescribed - Everybody reacts differently to medications and while MOST people have no trouble with most medicines, you may have a reaction such as nausea, vomiting, rash, swelling, shortness of breath. If this is the case, please stop taking the medicine immediately and contact your physician.  ?

## 2019-03-17 ENCOUNTER — Other Ambulatory Visit: Payer: Self-pay

## 2019-03-17 ENCOUNTER — Emergency Department (HOSPITAL_COMMUNITY)
Admission: EM | Admit: 2019-03-17 | Discharge: 2019-03-17 | Disposition: A | Discharge status: Home / Self Care | Payer: Medicaid Other | Attending: Emergency Medicine | Admitting: Emergency Medicine

## 2019-03-17 ENCOUNTER — Encounter (HOSPITAL_COMMUNITY): Payer: Self-pay | Admitting: Emergency Medicine

## 2019-03-17 DIAGNOSIS — R2242 Localized swelling, mass and lump, left lower limb: Secondary | ICD-10-CM | POA: Diagnosis not present

## 2019-03-17 DIAGNOSIS — M79605 Pain in left leg: Secondary | ICD-10-CM | POA: Diagnosis not present

## 2019-03-17 DIAGNOSIS — Z5321 Procedure and treatment not carried out due to patient leaving prior to being seen by health care provider: Secondary | ICD-10-CM | POA: Insufficient documentation

## 2019-03-17 DIAGNOSIS — Z86718 Personal history of other venous thrombosis and embolism: Secondary | ICD-10-CM | POA: Diagnosis not present

## 2019-03-17 NOTE — ED Notes (Signed)
Patient left and said she was going to "[facility] because they don't have a wait time".

## 2019-03-17 NOTE — ED Triage Notes (Signed)
Pt c/o left leg pain 10/10 and was seen yesterday for the same states she is not getting any better, pt refusing to be seen by a PA states she will like to be seen by doctor.

## 2019-03-18 DIAGNOSIS — Z86718 Personal history of other venous thrombosis and embolism: Secondary | ICD-10-CM | POA: Diagnosis not present

## 2019-03-20 ENCOUNTER — Other Ambulatory Visit: Payer: Self-pay

## 2019-03-20 ENCOUNTER — Ambulatory Visit (INDEPENDENT_AMBULATORY_CARE_PROVIDER_SITE_OTHER): Payer: Medicaid Other | Admitting: Family Medicine

## 2019-03-20 VITALS — BP 105/65 | HR 84 | Temp 98.4°F | Wt 346.0 lb

## 2019-03-20 DIAGNOSIS — Z23 Encounter for immunization: Secondary | ICD-10-CM

## 2019-03-20 DIAGNOSIS — M79605 Pain in left leg: Secondary | ICD-10-CM

## 2019-03-20 DIAGNOSIS — D6861 Antiphospholipid syndrome: Secondary | ICD-10-CM

## 2019-03-20 NOTE — Progress Notes (Signed)
   [facility] Phone: [phone]     Angelica Brown - 43 y.o. female MRN [id number]  Date of birth: 1975-11-04  Subjective:   cc: left leg pain  HPI:  Leg pain: started last Wednesday with an Aching in her left leg.  Started getting 'hot' and started itching, with toes 'tingling'.  Tried soaking it in ice.  It became swollen and hard and felt 'hot' but was not hot to the touch.  Does not recall injury or trauma. In Feb 2018 she was diagnosed with dural venous thrombosis.  Was later found to have a clotting disorder and put on Xarelto.  Since being put on Xarelto she has had pulmonary embolism and 3 myocardial infarctions.  She is currently on Xarelto, pressor gel, Topamax, Lipitor, and metoprolol.  Patient has gone to the emergency room twice since this occurred and both times the ultrasound revealed no DVT.  Has been referred to rheumatologist before for further evaluation of lupus but moved to [place] and did not follow through with the referral.   ROS: See HPI for pertinent positives and negatives  Family history reviewed for today's visit. No changes.   Objective:   BP 105/65   Pulse 84   Temp 98.4 F (36.9 C) (Oral)   Wt (!) 346 lb (156.9 kg)   LMP 02/26/2019   SpO2 95%   BMI 51.10 kg/m  Gen: Alert and oriented.  Obese. CV: Regular rate and rhythm.  No murmurs. Resp: Lungs clear to auscultation bilaterally, no wheezes.  Breath sounds diffusely decreased Msk: Lower extremities below the knee are symmetric.  No masses or lumps felt behind the knee or calves.  Patient endorsed tenderness to palpation in the popliteal region of the left leg.  Assessment/Plan:   Left leg pain Patient has clotting disorder with history of multiple MIs, PEs, and dural venous sinus thrombosis.  On Xarelto and prasugrel.  Leg pain is getting better per patient report.  Physical exam did not show any swelling or other abnormalities besides tenderness palpation.  Given  that she had two lower extremity ultrasounds both which did not reveal DVT and that the patient is already on anticoagulation, do not need to work this up further with more imaging unless the patient starts to experience more pain or shortness of breath.  If it is true that the patient has had multiple clotting episodes while on anticoagulation, need to consider something such as an IVC filter or switching her medication to a different anticoagulant.  Advised patient to make appointment with hematologist who is currently prescribing her anticoagulation to discuss these options.  Antiphospholipid antibody syndrome [facility]) Referred patient to rheumatologist for further work-up of possible lupus given her antiphospholipid antibody syndrome.   Clemetine Marker, MD PGY-2 [facility]

## 2019-03-20 NOTE — Patient Instructions (Addendum)
It was nice to see you today,  I think as long as your leg is starting to feel better we do not need to do any additional imaging.  If for whatever reason your leg starts to feel worse, becomes more swollen, or you start to develop shortness of breath or chest pain, you should go to the emergency department for further evaluation.  I will put in a referral for a rheumatologist for further work-up of your lupus.  Have a great day,  Clemetine Marker, MD

## 2019-03-22 DIAGNOSIS — M79605 Pain in left leg: Secondary | ICD-10-CM | POA: Insufficient documentation

## 2019-03-22 NOTE — Assessment & Plan Note (Signed)
Referred patient to rheumatologist for further work-up of possible lupus given her antiphospholipid antibody syndrome.

## 2019-03-22 NOTE — Assessment & Plan Note (Signed)
Patient has clotting disorder with history of multiple MIs, PEs, and dural venous sinus thrombosis.  On Xarelto and prasugrel.  Leg pain is getting better per patient report.  Physical exam did not show any swelling or other abnormalities besides tenderness palpation.  Given that she had two lower extremity ultrasounds both which did not reveal DVT and that the patient is already on anticoagulation, do not need to work this up further with more imaging unless the patient starts to experience more pain or shortness of breath.  If it is true that the patient has had multiple clotting episodes while on anticoagulation, need to consider something such as an IVC filter or switching her medication to a different anticoagulant.  Advised patient to make appointment with hematologist who is currently prescribing her anticoagulation to discuss these options.

## 2019-04-04 NOTE — Progress Notes (Deleted)
Virtual Visit via Video Note  I connected with Miski Feldpausch Cooper-Wade on 04/04/19 at  8:45 AM EST by a video enabled telemedicine application and verified that I am speaking with the correct person using two identifiers.  Location: Patient: Home  Provider: Clinic This service was conducted via virtual visit.  Both audio and visual tools were used.  The patient was located at home. I was located in my office.  Consent was obtained prior to the virtual visit and is aware of possible charges through their insurance for this visit.  The patient is an established patient.  Dr. Estanislado Pandy, MD conducted the virtual visit and Hazel Sams, PA-C acted as scribe during the service.  Office staff helped with scheduling follow up visits after the service was conducted.     I discussed the limitations of evaluation and management by telemedicine and the availability of in person appointments. The patient expressed understanding and agreed to proceed. CC: History of Present Illness: Patient is a 43 year old female with a past medical history of   Review of Systems  Constitutional: Negative for fever and malaise/fatigue.  Eyes: Negative for photophobia, pain, discharge and redness.  Respiratory: Negative for cough, shortness of breath and wheezing.   Cardiovascular: Negative for chest pain and palpitations.  Gastrointestinal: Negative for blood in stool, constipation and diarrhea.  Genitourinary: Negative for dysuria.  Musculoskeletal: Negative for back pain, joint pain, myalgias and neck pain.  Skin: Negative for rash.  Neurological: Negative for dizziness and headaches.  Psychiatric/Behavioral: Negative for depression. The patient is not nervous/anxious and does not have insomnia.       Observations/Objective: Physical Exam  Constitutional: She is oriented to person, place, and time and well-developed, well-nourished, and in no distress.  HENT:  Head: Normocephalic and atraumatic.  Eyes:  Conjunctivae are normal.  Pulmonary/Chest: Effort normal.  Neurological: She is alert and oriented to person, place, and time.  Psychiatric: Mood, memory, affect and judgment normal.   Patient reports morning stiffness for *** {minute/hour:19697}.   Patient {Actions; denies-reports:120008} nocturnal pain.  Difficulty dressing/grooming: {ACTIONS;DENIES/REPORTS:21021675::"Denies"} Difficulty climbing stairs: {ACTIONS;DENIES/REPORTS:21021675::"Denies"} Difficulty getting out of chair: {ACTIONS;DENIES/REPORTS:21021675::"Denies"} Difficulty using hands for taps, buttons, cutlery, and/or writing: {ACTIONS;DENIES/REPORTS:21021675::"Denies"}   Assessment and Plan:   Follow Up Instructions: She will follow up in    I discussed the assessment and treatment plan with the patient. The patient was provided an opportunity to ask questions and all were answered. The patient agreed with the plan and demonstrated an understanding of the instructions.   The patient was advised to call back or seek an in-person evaluation if the symptoms worsen or if the condition fails to improve as anticipated.  I provided *** minutes of non-face-to-face time during this encounter.   Ofilia Neas, PA-C

## 2019-04-09 ENCOUNTER — Telehealth: Payer: Medicaid Other | Admitting: Rheumatology

## 2019-04-26 ENCOUNTER — Telehealth: Payer: Self-pay | Admitting: Family Medicine

## 2019-04-26 NOTE — Telephone Encounter (Signed)
Patient calling concerning her referral for Rheumatology. Referral sent to Dr. Corliss Skains, but he is only doing virtual until end of January and he says patient needs to be seen in person before February and recommended patient be seen at [facility], [facility], [facility] or [place] (see referral notes). Patient still has not heard anything about where she has now been referred to. Please call patient with an update.   Routing to referral coordinator as well as PCP.

## 2019-04-26 NOTE — Telephone Encounter (Signed)
Annice Pih, let me know if I need to do anything with this, thanks

## 2019-05-10 NOTE — Progress Notes (Signed)
Patient Care Team: Leeanne Rio, MD as PCP - General (Pediatrics) Belva Crome, MD as PCP - Cardiology (Cardiology)  DIAGNOSIS:    ICD-10-CM   1. Acute deep vein thrombosis (DVT) of right upper extremity, unspecified vein (HCC)  I82.621     CHIEF COMPLIANT: Follow-up of dural venous sinus thrombosis on Eliquis  INTERVAL HISTORY: Angelica Brown is a 44 y.o. 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 She presents to the clinic today for follow-up.   ALLERGIES:  is allergic to ampicillin; penicillins; strawberry extract; cephalexin; and prednisone.  MEDICATIONS:  Current Outpatient Medications  Medication Sig Dispense Refill  . acetaminophen (TYLENOL) 500 MG tablet Take 500-1,000 mg by mouth every 6 (six) hours as needed for mild pain or headache.    Marland Kitchen apixaban (ELIQUIS) 5 MG TABS tablet Take 1 tablet (5 mg total) by mouth 2 (two) times daily. (Patient not taking: Reported on 03/16/2019) 60 tablet 11  . aspirin EC 81 MG tablet Take 1 tablet (81 mg total) by mouth daily. Take for 1 month and stop (Patient not taking: Reported on 03/16/2019)    . atorvastatin (LIPITOR) 80 MG tablet Take 1 tablet (80 mg total) by mouth at bedtime. (Patient taking differently: Take 80 mg by mouth daily. ) 90 tablet 3  . baclofen (LIORESAL) 10 MG tablet Take 1 tablet (10 mg total) by mouth 3 (three) times daily. (Patient not taking: Reported on 03/16/2019) 30 each 0  . cyclobenzaprine (FLEXERIL) 10 MG tablet Take 1 tablet (10 mg total) by mouth 2 (two) times daily as needed for muscle spasms. 8 tablet 0  . HYDROcodone-acetaminophen (NORCO/VICODIN) 5-325 MG tablet Take 1-2 tablets by mouth every 6 (six) hours as needed. (Patient not taking: Reported on 03/16/2019) 6 tablet 0  . metoprolol tartrate (LOPRESSOR) 25 MG tablet Take 0.5 tablets (12.5 mg total)  by mouth daily with breakfast. (Patient taking differently: Take 25 mg by mouth daily with breakfast. ) 45 tablet 0  . nitroGLYCERIN (NITROSTAT) 0.4 MG SL tablet Place 1 tablet (0.4 mg total) under the tongue every 5 (five) minutes x 3 doses as needed for chest pain. 25 tablet 12  . omeprazole (PRILOSEC) 40 MG capsule Take 40 mg by mouth 2 (two) times daily as needed (for GERD-like symptoms).     . ondansetron (ZOFRAN ODT) 4 MG disintegrating tablet Take 1 tablet (4 mg total) by mouth every 8 (eight) hours as needed for nausea or vomiting. (Patient not taking: Reported on 03/16/2019) 20 tablet 0  . prasugrel (EFFIENT) 10 MG TABS tablet Take 1 tablet (10 mg total) by mouth daily. 90 each 0  . rivaroxaban (XARELTO) 20 MG TABS tablet Take 20 mg by mouth daily.    . tamsulosin (FLOMAX) 0.4 MG CAPS capsule Take 1 capsule (0.4 mg total) by mouth daily. (Patient not taking: Reported on 03/16/2019) 30 capsule 0  . topiramate (TOPAMAX) 50 MG tablet Take 1 tablet (50 mg total) by mouth daily. 90 tablet 0   No current facility-administered medications for this visit.    PHYSICAL EXAMINATION: ECOG PERFORMANCE STATUS: 1 - Symptomatic but completely ambulatory  Vitals:   05/11/19 0930  BP: 139/86  Pulse: 83  Resp: 18  Temp: 98.3 F (36.8 C)  SpO2: 96%   Filed Weights   05/11/19 0930  Weight: (!) 351 lb 12.8 oz (159.6 kg)    LABORATORY  DATA:  I have reviewed the data as listed CMP Latest Ref Rng & Units 11/17/2018 05/05/2018 05/04/2018  Glucose 70 - 99 mg/dL 86 628(B) 151(V)  BUN 6 - 20 mg/dL 9 7 11   Creatinine 0.44 - 1.00 mg/dL 6.16 0.73  Sodium 135 - 145 mmol/L 139 139 140  Potassium 3.5 - 5.1 mmol/L 3.5 3.4(L) 3.5  Chloride 98 - 111 mmol/L 103 107 106  CO2 22 - 32 mmol/L 26 25 23   Calcium 8.9 - 10.3 mg/dL 9.3 7.10) 9.0  Total Protein 6.5 - 8.1 g/dL - 6.3(L) -  Total Bilirubin 0.3 - 1.2 mg/dL - 0.9 -  Alkaline Phos 38 - 126 U/L - 53 -  AST 15 - 41 U/L - 48(H) -  ALT 0 - 44 U/L - 18  -    Lab Results  Component Value Date   WBC 15.3 (H) 11/17/2018   HGB 13.8 11/17/2018   HCT 38.8 11/17/2018   MCV 81.3 11/17/2018   PLT 328 11/17/2018   NEUTROABS 9.3 (H) 11/17/2018    ASSESSMENT & PLAN:  DVT of upper extremity (deep vein thrombosis) (HCC) Sudden onset of headaches treated initially for migraines but when she was found to have profoundly enlarged retinal veins, she underwent a CT of the head which revealed dural venous sinus thrombosis.  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 However given the convenience of Xarelto, patient elected to stay on Xarelto at this time. She got newly married and is super excited  Return to clinic in 1 year for follow-up.  No orders of the defined types were placed in this encounter.  The patient has a good understanding of the overall plan. she agrees with it. she will call with any problems that may develop before the next visit here.  Total time spent: 30 mins including face to face time and time spent for planning, charting and coordination of care  11/19/2018, MD 05/11/2019  I, Serena Croissant Dorshimer, am acting as scribe for Dr. 05/13/2019.  I have reviewed the above documentation for accuracy and completeness, and I agree with the above.

## 2019-05-11 ENCOUNTER — Inpatient Hospital Stay: Payer: Medicaid Other | Attending: Hematology and Oncology | Admitting: Hematology and Oncology

## 2019-05-11 ENCOUNTER — Telehealth: Payer: Self-pay | Admitting: Hematology and Oncology

## 2019-05-11 ENCOUNTER — Other Ambulatory Visit: Payer: Self-pay

## 2019-05-11 DIAGNOSIS — I252 Old myocardial infarction: Secondary | ICD-10-CM | POA: Diagnosis not present

## 2019-05-11 DIAGNOSIS — Z86711 Personal history of pulmonary embolism: Secondary | ICD-10-CM | POA: Insufficient documentation

## 2019-05-11 DIAGNOSIS — Z7982 Long term (current) use of aspirin: Secondary | ICD-10-CM | POA: Insufficient documentation

## 2019-05-11 DIAGNOSIS — Z7901 Long term (current) use of anticoagulants: Secondary | ICD-10-CM | POA: Insufficient documentation

## 2019-05-11 DIAGNOSIS — Z86718 Personal history of other venous thrombosis and embolism: Secondary | ICD-10-CM | POA: Diagnosis not present

## 2019-05-11 DIAGNOSIS — Z79899 Other long term (current) drug therapy: Secondary | ICD-10-CM | POA: Insufficient documentation

## 2019-05-11 DIAGNOSIS — I82621 Acute embolism and thrombosis of deep veins of right upper extremity: Secondary | ICD-10-CM | POA: Diagnosis not present

## 2019-05-11 MED ORDER — RIVAROXABAN 20 MG PO TABS: 20.0000 mg | ORAL_TABLET | Freq: Every day | ORAL | 3 refills | Status: DC

## 2019-05-11 NOTE — Assessment & Plan Note (Signed)
Sudden onset of headaches treated initially for migraines but when she was found to have profoundly enlarged retinal veins, she underwent a CT of the head which revealed dural venous sinus thrombosis.  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 However given the convenience of Eliquis patient elected to stay on Eliquis at this time.

## 2019-05-11 NOTE — Telephone Encounter (Signed)
I left a message regarding schedule  

## 2019-05-18 DIAGNOSIS — R808 Other proteinuria: Secondary | ICD-10-CM | POA: Diagnosis not present

## 2019-05-18 DIAGNOSIS — D6861 Antiphospholipid syndrome: Secondary | ICD-10-CM | POA: Diagnosis not present

## 2019-05-18 DIAGNOSIS — Z86718 Personal history of other venous thrombosis and embolism: Secondary | ICD-10-CM | POA: Diagnosis not present

## 2019-05-18 DIAGNOSIS — M7918 Myalgia, other site: Secondary | ICD-10-CM | POA: Diagnosis not present

## 2019-05-18 DIAGNOSIS — Z7901 Long term (current) use of anticoagulants: Secondary | ICD-10-CM | POA: Diagnosis not present

## 2019-05-22 ENCOUNTER — Ambulatory Visit: Payer: Medicaid Other | Admitting: Rheumatology

## 2019-05-24 ENCOUNTER — Other Ambulatory Visit: Payer: Self-pay | Admitting: Family Medicine

## 2019-07-05 ENCOUNTER — Telehealth: Payer: Self-pay | Admitting: Family Medicine

## 2019-07-05 NOTE — Telephone Encounter (Signed)
Patient calling to speak with PCP concerning her having a few heart troubles and wanted to gets professional advise about COVID vaccine.   Re:   Provider:  B.McIntyre Verified demographics and best number to call back is [phone]   Will route call to

## 2019-07-06 NOTE — Telephone Encounter (Signed)
Called patient and answered her questions. Discussed recommendations regarding COVID vaccine, and expectations about symptoms around the time of the vaccine (may not feel well, especially after second dose, normal immune response). Encouraged her to be vaccinated.  She also says she had a sleep study in [place] and is going to have them fax the result to [place] here in [place]. For some reason it was approved through her insurance in [place] but not here in [place] so she traveled to [place] to get the sleep study. She is back in [place] now. I will wait to receive those results.  Patient appreciative Latrelle Dodrill, MD

## 2019-07-11 ENCOUNTER — Telehealth: Payer: Self-pay | Admitting: Family Medicine

## 2019-07-11 DIAGNOSIS — G4733 Obstructive sleep apnea (adult) (pediatric): Secondary | ICD-10-CM

## 2019-07-11 NOTE — Telephone Encounter (Signed)
Received sleep study report from [place] (date of study 06/06/19).  Patient has very severe OSA with AHI of 108.2 per hour. Needs nocturnal bipap therapy.  I have placed orders. RN team, can you facilitate her getting home bipap set up?  I have included photos of her sleep study result below.  Please also inform patient I have her sleep study results and that we are working on getting her set up for home bipap. Thanks!  Thanks Latrelle Dodrill, MD

## 2019-07-12 ENCOUNTER — Telehealth: Payer: Self-pay

## 2019-07-12 NOTE — Telephone Encounter (Signed)
Opened in error

## 2019-07-12 NOTE — Telephone Encounter (Signed)
Message received from [employer]:   Message Received: Today Message Contents  Jacqlyn Krauss, CMA; Netty Starring, [place]; Frederica Kuster, in order to process the order for a Replacement BIPAP, we will need:  -Baseline sleep study w/signed interpretation  -Titration study w/signed interpretation showing failed CPAP and movement to BIPAP  -Current office visit notes that speak of the patients usage and benefit of PAP therapy.   According to the telephone notes, the patient's studies were done in [place]. If those are not available the patient will have to have all new testing done to requalify the DX and requalify for the equipment.

## 2019-07-16 NOTE — Telephone Encounter (Signed)
I don't understand - this is not a replacement Bipap, it is a new start on bipap. She just had the sleep study done in [place] in February and they recommended she use bipap.  Let me know if there is more for me to do here.  Thanks. Latrelle Dodrill, MD

## 2019-07-23 NOTE — Telephone Encounter (Signed)
I sent another community message to Angelica Brown asking to help with this.

## 2019-07-24 NOTE — Progress Notes (Deleted)
Cardiology Office Note:    Date:  07/24/2019   ID:  Angelica Brown, DOB 1975-09-07, MRN [id number]  PCP:  Angelica Dodrill, MD  Cardiologist:  Angelica Noe, MD   Referring MD: Angelica Dodrill, MD   No chief complaint on file.   History of Present Illness:    Angelica Brown is a 44 y.o. female with a hx of CAD with prior stenting of the RCA in July 2019 and September 2019, anti-phospholipid antibody syndrome and lupus anti-coagulant resulting in thrombophilia, DVT, obesity and HTN. Her first coronary event was in [place] in July 2019 (bare metal stent) to mid RCA followed by stent thrombosis in September 2019 on Plavix/ASA/Xarelto. A drug eluting stent was placed in the RCA covering the entire bare metal stent. She was treated Effient and Xarelto. Brllinta causes SOB and Plavix non-responder. Recurrent subacute stent thrombosis 05/04/18, after missing both Effient and Xarelto for 3 days. SAT was treated with Angiojet thrombectomy with restoration of excellent flow to the distal vessel. There was no disease in the LAD or Circumflex. She was loaded with Effient in the cath lab and was started on Aggrastat.   ***  Past Medical History:  Diagnosis Date  . ACS (acute coronary syndrome) (HCC) 05/04/2018  . CHOLELITHIASIS   . Chronic systolic CHF (congestive heart failure) (HCC) 06/13/2016   Ischemic CM (inf MI 05/2018) >> Echo 05/04/2018 - Mod LVH, EF 40-45, inf-lat HK, PASP 47 (mild pul HTN)  . Coronary artery disease   . Dermatophytosis of nail   . 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 GERD (gastroesophageal reflux disease)   . Gestational diabetes    "w/all 4 pregnancies"  . Heart murmur   . Hypertension   . Metrorrhagia   . Migraine    "at least twice/month" (05/28/2016)  . Nystagmus 06/30/2016  . OBESITY, MORBID   . OBESITY, NOS   . Pneumonia 11/2014  . POSTURAL  LIGHTHEADEDNESS   . Sickle cell trait (HCC)   . STEMI (ST elevation myocardial infarction) (HCC) 11/09/2017   in [place], [place] - see records in Care Everywhere  . SYNCOPE   . Throat pain   . TOBACCO DEPENDENCE   . URI   . Urinary frequency     Past Surgical History:  Procedure Laterality Date  . BARTHOLIN GLAND CYST EXCISION Bilateral 2004  . CORONARY THROMBECTOMY N/A 05/04/2018   Procedure: Coronary Thrombectomy;  Surgeon: Lyn Records, MD;  Location: [facility] INVASIVE CV LAB;  Service: Cardiovascular;  Laterality: N/A;  . EXCISIONAL HEMORRHOIDECTOMY  2006   "removed a blood clot from one"  . LEFT HEART CATH AND CORONARY ANGIOGRAPHY N/A 05/04/2018   Procedure: LEFT HEART CATH AND CORONARY ANGIOGRAPHY;  Surgeon: Lyn Records, MD;  Location: [facility] INVASIVE CV LAB;  Service: Cardiovascular;  Laterality: N/A;  . TUBAL LIGATION  2007  . WISDOM TOOTH EXTRACTION  1998    Current Medications: No outpatient medications have been marked as taking for the 07/25/19 encounter (Appointment) with Lyn Records, MD.     Allergies:   Ampicillin, Penicillins, Strawberry extract, Cephalexin, and Prednisone   Social History   Socioeconomic History  . Marital status: Divorced    Spouse name: Not on file  . Number of children: Not on file  . Years of education: Not on file  . Highest education level: Not on file  Occupational History  . Not  on file  Tobacco Use  . Smoking status: Current Every Day Smoker    Packs/day: 0.15    Years: 18.00    Pack years: 2.70    Types: Cigarettes  . Smokeless tobacco: Never Used  Substance and Sexual Activity  . Alcohol use: No  . Drug use: No  . Sexual activity: Yes    Birth control/protection: Surgical  Other Topics Concern  . Not on file  Social History Narrative   Angelica Brown - 2003    Angelica Brown - 2008   Live with Angelica Brown    Social Determinants of Health   Financial Resource Strain:   . Difficulty of Paying Living Expenses:   Food  Insecurity:   . Worried About Charity fundraiser in the Last Year:   . Arboriculturist in the Last Year:   Transportation Needs:   . Film/video editor (Medical):   Marland Kitchen Lack of Transportation (Non-Medical):   Physical Activity:   . Days of Exercise per Week:   . Minutes of Exercise per Session:   Stress:   . Feeling of Stress :   Social Connections:   . Frequency of Communication with Friends and Family:   . Frequency of Social Gatherings with Friends and Family:   . Attends Religious Services:   . Active Member of Clubs or Organizations:   . Attends Archivist Meetings:   Marland Kitchen Marital Status:      Family History: The patient's family history includes Breast cancer in her mother; Cancer in her father; Diabetes in her father, paternal grandfather, and paternal grandmother.  ROS:   Please see the history of present illness.    *** All other systems reviewed and are negative.  EKGs/Labs/Other Studies Reviewed:    The following studies were reviewed today: ***  EKG:  EKG ***  Recent Labs: 11/17/2018: BUN 9; Creatinine, Brown 0.96; Hemoglobin 13.8; Platelets 328; Potassium 3.5; Sodium 139  Recent Lipid Panel    Component Value Date/Time   CHOL 135 05/05/2018 0042   TRIG 85 05/05/2018 0042   HDL 40 (L) 05/05/2018 0042   CHOLHDL 3.4 05/05/2018 0042   VLDL 17 05/05/2018 0042   LDLCALC 78 05/05/2018 0042    Physical Exam:    VS:  There were no vitals taken for this visit.    Wt Readings from Last 3 Encounters:  05/11/19 (!) 351 lb 12.8 oz (159.6 kg)  03/20/19 (!) 346 lb (156.9 kg)  03/17/19 (!) 315 lb (142.9 kg)     GEN: ***. No acute distress HEENT: Normal NECK: No JVD. LYMPHATICS: No lymphadenopathy CARDIAC: *** RRR without murmur, gallop, or edema. VASCULAR: *** Normal Pulses. No bruits. RESPIRATORY:  Clear to auscultation without rales, wheezing or rhonchi  ABDOMEN: Soft, non-tender, non-distended, No pulsatile mass, MUSCULOSKELETAL: No deformity    SKIN: Warm and dry NEUROLOGIC:  Alert and oriented x 3 PSYCHIATRIC:  Normal affect   ASSESSMENT:    No diagnosis found. PLAN:    In order of problems listed above:  1. ***   Medication Adjustments/Labs and Tests Ordered: Current medicines are reviewed at length with the patient today.  Concerns regarding medicines are outlined above.  No orders of the defined types were placed in this encounter.  No orders of the defined types were placed in this encounter.   There are no Patient Instructions on file for this visit.   Signed, Sinclair Grooms, MD  07/24/2019 12:52 PM    [facility]

## 2019-07-24 NOTE — Telephone Encounter (Signed)
So, the issue is [employer] can not see [place]. I printed the study and faxed to Angelica Brown at [employer].

## 2019-07-25 ENCOUNTER — Other Ambulatory Visit: Payer: Self-pay | Admitting: Family Medicine

## 2019-07-25 ENCOUNTER — Ambulatory Visit: Payer: Medicaid Other | Admitting: Interventional Cardiology

## 2019-07-25 NOTE — Telephone Encounter (Signed)
Called and confirmed--patient is taking--takes 1/2 tab TWICE per day (tartrate form).  Terisa Starr, MD  Family Medicine Teaching Service

## 2019-07-27 ENCOUNTER — Other Ambulatory Visit: Payer: Self-pay | Admitting: Family Medicine

## 2019-07-30 ENCOUNTER — Other Ambulatory Visit: Payer: Self-pay

## 2019-07-31 ENCOUNTER — Other Ambulatory Visit: Payer: Self-pay

## 2019-07-31 ENCOUNTER — Ambulatory Visit (INDEPENDENT_AMBULATORY_CARE_PROVIDER_SITE_OTHER): Payer: Medicaid Other | Admitting: Family Medicine

## 2019-07-31 ENCOUNTER — Encounter: Payer: Self-pay | Admitting: Family Medicine

## 2019-07-31 ENCOUNTER — Other Ambulatory Visit (HOSPITAL_COMMUNITY)
Admission: RE | Admit: 2019-07-31 | Discharge: 2019-07-31 | Disposition: A | Discharge status: Home / Self Care | Payer: Medicaid Other | Source: Ambulatory Visit | Attending: Family Medicine | Admitting: Family Medicine

## 2019-07-31 VITALS — BP 138/82 | HR 70 | Ht 69.0 in | Wt 362.6 lb

## 2019-07-31 DIAGNOSIS — R102 Pelvic and perineal pain: Secondary | ICD-10-CM

## 2019-07-31 DIAGNOSIS — Z124 Encounter for screening for malignant neoplasm of cervix: Secondary | ICD-10-CM

## 2019-07-31 DIAGNOSIS — I252 Old myocardial infarction: Secondary | ICD-10-CM

## 2019-07-31 DIAGNOSIS — M329 Systemic lupus erythematosus, unspecified: Secondary | ICD-10-CM | POA: Diagnosis not present

## 2019-07-31 DIAGNOSIS — G4733 Obstructive sleep apnea (adult) (pediatric): Secondary | ICD-10-CM | POA: Diagnosis not present

## 2019-07-31 DIAGNOSIS — Z1231 Encounter for screening mammogram for malignant neoplasm of breast: Secondary | ICD-10-CM

## 2019-07-31 DIAGNOSIS — Z113 Encounter for screening for infections with a predominantly sexual mode of transmission: Secondary | ICD-10-CM | POA: Diagnosis not present

## 2019-07-31 DIAGNOSIS — D6861 Antiphospholipid syndrome: Secondary | ICD-10-CM

## 2019-07-31 LAB — POCT URINE PREGNANCY: Preg Test, Ur: NEGATIVE

## 2019-07-31 MED ORDER — PRASUGREL HCL 10 MG PO TABS: 10.0000 mg | ORAL_TABLET | Freq: Every day | ORAL | 0 refills | Status: DC

## 2019-07-31 MED ORDER — ROSUVASTATIN CALCIUM 40 MG PO TABS: 40.0000 mg | ORAL_TABLET | Freq: Every day | ORAL | 3 refills | Status: DC

## 2019-07-31 NOTE — Progress Notes (Signed)
  Date of Visit: 07/31/2019   SUBJECTIVE:   HPI:  Angelica Brown presents today for routine visit and pap smear.  Antiphospholipid antibody syndrome - currently taking xarelto. Saw rheumatology who recommended she switch to coumadin. Patient does not want to change to coumadin, prefers to continue DOAC, does not think she could be compliant with frequent INR checks.  OSA - seen in [place] for sleep study, which was read as severe OSA and recommended bipap therapy. In process of trying to set this up through [facility].  History of MI - continues on effient, needs refilled. Has upcoming appointment with cardiologist Dr. Katrinka Blazing. Also notes her rheumatologist recommended she try a medication other than lipitor as they thought her body aches may be coming from that. Patient interested in switching to another statin.  Pelvic pain - here for pap smear. Gets monthly period, alternates between heavy and light. Endorses sharp pain on L side of mid pelvis, unrelated to menses, happening every 2-3 days, lasting seconds to minutes, all for the last few months. Does have some occasional pasty/loose bowels, has multiple BMs per day. Also endorses low sex drive. Desires STD testing today.   OBJECTIVE:   BP 138/82   Pulse 70   Ht 5\' 9"  (1.753 m)   Wt (!) 362 lb 9.6 oz (164.5 kg)   SpO2 95%   BMI 53.55 kg/m  Gen: no acute distress pleasant cooperative HEENT: normocephalic, atraumatic  Heart: regular rate and rhythm,  Lungs: clear to auscultation bilaterally  Neuro: alert speech normal grossly nonfocal Ext: No appreciable lower extremity edema bilaterally  GU: normal appearing external genitalia without lesions. Vagina is moist with normal discharge. Cervix normal in appearance. Exam limited by habitus but does have some mild possible L tenderness on bimanual exam. No masses. No cervical motion tenderness.  ASSESSMENT/PLAN:   Health maintenance:  -mammogram ordered, handout given on how to  schedule -pap smear done today -STD screening: HIV, RPR, gc/chlamydia/trich today -encouraged her to get COVID vaccination  Pelvic pain Etiology unclear. Exam limited by habitus. Upreg negative. Check pelvic ultrasound, ordered & scheduled.  History of MI (myocardial infarction) Refill effient until she can see cardiology, has follow up appointment in place.  Change atorv to crestor to see if helps with body aches  OSA (obstructive sleep apnea) Diagnosed on sleep study in . We are attempting to set her up with home bipap therapy here as that is what sleep study recommended.  Antiphospholipid antibody syndrome (HCC) Prefers to stay on xarelto, reasonable especially if she is not able to comply with INR checks. Continue.   [place]. [place], MD [facility]

## 2019-07-31 NOTE — Patient Instructions (Addendum)
It was great to see you again today!  Checking labs, swabs, pap today  I recommend you get your COVID vaccine!  See the handout on how to schedule your mammogram. This is an important test to screen for breast cancer.  Refilled effient, keep cardiology follow up appointment  Start crestor, stop atorvastatin (lipitor)  Getting ultrasound of pelvis  Be well, Dr. Pollie Meyer

## 2019-08-03 ENCOUNTER — Ambulatory Visit
Admission: RE | Admit: 2019-08-03 | Discharge: 2019-08-03 | Disposition: A | Discharge status: Home / Self Care | Payer: Medicaid Other | Source: Ambulatory Visit | Attending: Family Medicine | Admitting: Family Medicine

## 2019-08-03 ENCOUNTER — Other Ambulatory Visit: Payer: Self-pay

## 2019-08-03 DIAGNOSIS — Z1231 Encounter for screening mammogram for malignant neoplasm of breast: Secondary | ICD-10-CM | POA: Diagnosis not present

## 2019-08-03 LAB — CYTOLOGY - PAP
Chlamydia: NEGATIVE
Comment: NEGATIVE
Comment: NEGATIVE
Comment: NEGATIVE
Comment: NORMAL
Diagnosis: NEGATIVE
High risk HPV: NEGATIVE
Neisseria Gonorrhea: NEGATIVE
Trichomonas: NEGATIVE

## 2019-08-06 ENCOUNTER — Telehealth: Payer: Self-pay | Admitting: Family Medicine

## 2019-08-06 ENCOUNTER — Other Ambulatory Visit: Payer: Self-pay | Admitting: Family Medicine

## 2019-08-06 DIAGNOSIS — D6861 Antiphospholipid syndrome: Secondary | ICD-10-CM

## 2019-08-06 DIAGNOSIS — Z113 Encounter for screening for infections with a predominantly sexual mode of transmission: Secondary | ICD-10-CM

## 2019-08-06 DIAGNOSIS — G4733 Obstructive sleep apnea (adult) (pediatric): Secondary | ICD-10-CM

## 2019-08-06 DIAGNOSIS — R102 Pelvic and perineal pain: Secondary | ICD-10-CM | POA: Insufficient documentation

## 2019-08-06 NOTE — Assessment & Plan Note (Signed)
Prefers to stay on xarelto, reasonable especially if she is not able to comply with INR checks. Continue.

## 2019-08-06 NOTE — Assessment & Plan Note (Signed)
Etiology unclear. Exam limited by habitus. Upreg negative. Check pelvic ultrasound, ordered & scheduled.

## 2019-08-06 NOTE — Assessment & Plan Note (Signed)
Diagnosed on sleep study in [place]. We are attempting to set her up with home bipap therapy here as that is what sleep study recommended.

## 2019-08-06 NOTE — Telephone Encounter (Signed)
Called patient to discuss several items: - pap, gc/chl/trich all normal. Next pap in 5 years - never had labs drawn at visit, lab visit scheduled for tomorrow AM to check HIV, RPR, CBC - needs sleep study done in [place] apparently [facility]. I have placed order.  Red team can you reach out to [facility] sleep center and make sure they will contact patient with a sleep study appointment?  Thanks Latrelle Dodrill, MD

## 2019-08-06 NOTE — Telephone Encounter (Signed)
Attempted to call [facility] sleep center no answer. Will try again tomorrow. Angelica Brown Bruna Potter, CMA

## 2019-08-06 NOTE — Assessment & Plan Note (Signed)
Refill effient until she can see cardiology, has follow up appointment in place.  Change atorv to crestor to see if helps with body aches

## 2019-08-07 ENCOUNTER — Other Ambulatory Visit: Payer: Medicaid Other

## 2019-08-07 ENCOUNTER — Ambulatory Visit (HOSPITAL_COMMUNITY): Payer: Medicaid Other

## 2019-08-07 ENCOUNTER — Other Ambulatory Visit: Payer: Self-pay

## 2019-08-07 DIAGNOSIS — D6861 Antiphospholipid syndrome: Secondary | ICD-10-CM

## 2019-08-07 DIAGNOSIS — Z113 Encounter for screening for infections with a predominantly sexual mode of transmission: Secondary | ICD-10-CM

## 2019-08-07 NOTE — Telephone Encounter (Signed)
Pt scheduled and informed. Zacharias Ridling, CMA  

## 2019-08-09 ENCOUNTER — Ambulatory Visit (HOSPITAL_COMMUNITY): Payer: Medicaid Other | Attending: Family Medicine

## 2019-08-09 ENCOUNTER — Encounter: Payer: Self-pay | Admitting: Family Medicine

## 2019-08-09 LAB — HIV ANTIBODY (ROUTINE TESTING W REFLEX): HIV Screen 4th Generation wRfx: NONREACTIVE

## 2019-08-09 LAB — RPR, QUANT+TP ABS (REFLEX)
Rapid Plasma Reagin, Quant: 1:2 {titer} — ABNORMAL HIGH
T Pallidum Abs: NONREACTIVE

## 2019-08-09 LAB — CBC
Hematocrit: 42.2 % (ref 34.0–46.6)
Hemoglobin: 13.9 g/dL (ref 11.1–15.9)
MCH: 28.3 pg (ref 26.6–33.0)
MCHC: 32.9 g/dL (ref 31.5–35.7)
MCV: 86 fL (ref 79–97)
Platelets: 370 10*3/uL (ref 150–450)
RBC: 4.91 x10E6/uL (ref 3.77–5.28)
RDW: 15.2 % (ref 11.7–15.4)
WBC: 13.1 10*3/uL — ABNORMAL HIGH (ref 3.4–10.8)

## 2019-08-09 LAB — RPR: RPR Ser Ql: REACTIVE — AB

## 2019-08-21 ENCOUNTER — Other Ambulatory Visit (HOSPITAL_COMMUNITY)
Admission: RE | Admit: 2019-08-21 | Discharge: 2019-08-21 | Disposition: A | Discharge status: Home / Self Care | Payer: Medicaid Other | Source: Ambulatory Visit | Attending: Internal Medicine | Admitting: Internal Medicine

## 2019-08-21 DIAGNOSIS — Z20822 Contact with and (suspected) exposure to covid-19: Secondary | ICD-10-CM | POA: Diagnosis not present

## 2019-08-21 DIAGNOSIS — Z01812 Encounter for preprocedural laboratory examination: Secondary | ICD-10-CM | POA: Insufficient documentation

## 2019-08-21 LAB — SARS CORONAVIRUS 2 (TAT 6-24 HRS): SARS Coronavirus 2: NEGATIVE

## 2019-08-23 ENCOUNTER — Other Ambulatory Visit: Payer: Self-pay

## 2019-08-23 ENCOUNTER — Ambulatory Visit (HOSPITAL_BASED_OUTPATIENT_CLINIC_OR_DEPARTMENT_OTHER): Payer: Medicaid Other | Attending: Family Medicine | Admitting: Internal Medicine

## 2019-08-23 VITALS — Ht 68.0 in | Wt 358.0 lb

## 2019-08-23 DIAGNOSIS — G4733 Obstructive sleep apnea (adult) (pediatric): Secondary | ICD-10-CM | POA: Diagnosis not present

## 2019-08-27 NOTE — Progress Notes (Signed)
Cardiology Office Note:    Date:  08/28/2019   ID:  Angelica Brown, DOB [birth year], MRN [id number]  PCP:  Latrelle Dodrill, MD  Cardiologist:  Lesleigh Noe, MD   Referring MD: Latrelle Dodrill, MD   Chief Complaint  Patient presents with  . Coronary Artery Disease    History of Present Illness:    Angelica Brown is a 44 y.o. female with a hx of CAD with prior stenting of the RCA in July 2019 (BMS) and September 2019 (stent thrombosis possibly plavix non responder- re-stented with DES), anti-phospholipid antibody syndrome, lupus anti-coagulant, secondary thrombophilia, DVT, obesity, HTN, Brilinta intolerant, ultimately Effient/Xarelto, and acute IMI 05/04/2018 after missed doses of Effient and Xarelto due to cost--> recurrent stent thrombosis treated with angiojet and re-institution of Effient/Xarelto.  Recent obstructive sleep apnea diagnosis requiring BiPAP.  She has not had angina.  She understands that she has an inherited thrombophilia and that Xarelto/antithrombotic therapy will be needed lifelong.  She also understands that she needs antiplatelet therapy because of stents in the right coronary that have been associated with stent thrombosis x2.  She denies angina.  She feels tired and short of breath all the time.  She is in [place] to stay.  She has not needed sublingual nitroglycerin..  Past Medical History:  Diagnosis Date  . ACS (acute coronary syndrome) (HCC) 05/04/2018  . CHOLELITHIASIS   . Chronic systolic CHF (congestive heart failure) (HCC) 06/13/2016   Ischemic CM (inf MI 05/2018) >> Echo 05/04/2018 - Mod LVH, EF 40-45, inf-lat HK, PASP 47 (mild pul HTN)  . Coronary artery disease   . Dermatophytosis of nail   . 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 GERD (gastroesophageal reflux disease)   . Gestational diabetes    "w/all 4 pregnancies"  . Heart murmur   .  Hypertension   . Metrorrhagia   . Migraine    "at least twice/month" (05/28/2016)  . Nystagmus 06/30/2016  . OBESITY, MORBID   . OBESITY, NOS   . Pneumonia 11/2014  . POSTURAL LIGHTHEADEDNESS   . Sickle cell trait (HCC)   . STEMI (ST elevation myocardial infarction) (HCC) 11/09/2017   in [place], [place] - see records in Care Everywhere  . SYNCOPE   . Throat pain   . TOBACCO DEPENDENCE   . URI   . Urinary frequency     Past Surgical History:  Procedure Laterality Date  . BARTHOLIN GLAND CYST EXCISION Bilateral 2004  . CORONARY THROMBECTOMY N/A 05/04/2018   Procedure: Coronary Thrombectomy;  Surgeon: Lyn Records, MD;  Location: [facility] INVASIVE CV LAB;  Service: Cardiovascular;  Laterality: N/A;  . EXCISIONAL HEMORRHOIDECTOMY  2006   "removed a blood clot from one"  . LEFT HEART CATH AND CORONARY ANGIOGRAPHY N/A 05/04/2018   Procedure: LEFT HEART CATH AND CORONARY ANGIOGRAPHY;  Surgeon: Lyn Records, MD;  Location: [facility] INVASIVE CV LAB;  Service: Cardiovascular;  Laterality: N/A;  . TUBAL LIGATION  2007  . WISDOM TOOTH EXTRACTION  1998    Current Medications: Current Meds  Medication Sig  . acetaminophen (TYLENOL) 500 MG tablet Take 500-1,000 mg by mouth every 6 (six) hours as needed for mild pain or headache.  . metoprolol tartrate (LOPRESSOR) 25 MG tablet TAKE 1/2 (ONE-HALF) TABLET BY MOUTH ONCE DAILY WITH BREAKFAST  . nitroGLYCERIN (NITROSTAT) 0.4 MG SL tablet Place 1 tablet (0.4 mg total) under the tongue every  5 (five) minutes x 3 doses as needed for chest pain.  Marland Kitchen omeprazole (PRILOSEC) 40 MG capsule Take 40 mg by mouth 2 (two) times daily as needed (for GERD-like symptoms).   . prasugrel (EFFIENT) 10 MG TABS tablet Take 1 tablet (10 mg total) by mouth daily.  . rivaroxaban (XARELTO) 20 MG TABS tablet Take 1 tablet (20 mg total) by mouth daily.  . rosuvastatin (CRESTOR) 40 MG tablet Take 1 tablet (40 mg total) by mouth daily.  Marland Kitchen topiramate (TOPAMAX) 50 MG tablet Take 1 tablet  by mouth once daily     Allergies:   Ampicillin, Penicillins, Strawberry extract, Cephalexin, and Prednisone   Social History   Socioeconomic History  . Marital status: Married    Spouse name: Not on file  . Number of children: Not on file  . Years of education: Not on file  . Highest education level: Not on file  Occupational History  . Not on file  Tobacco Use  . Smoking status: Current Every Day Smoker    Packs/day: 0.15    Years: 18.00    Pack years: 2.70    Types: Cigarettes  . Smokeless tobacco: Never Used  Substance and Sexual Activity  . Alcohol use: No  . Drug use: No  . Sexual activity: Yes    Birth control/protection: Surgical  Other Topics Concern  . Not on file  Social History Narrative   Dennard Schaumann - 2003    Olga Coaster - 2008   Live with Lurline Del    Social Determinants of Health   Financial Resource Strain:   . Difficulty of Paying Living Expenses:   Food Insecurity:   . Worried About Programme researcher, broadcasting/film/video in the Last Year:   . Barista in the Last Year:   Transportation Needs:   . Freight forwarder (Medical):   Marland Kitchen Lack of Transportation (Non-Medical):   Physical Activity:   . Days of Exercise per Week:   . Minutes of Exercise per Session:   Stress:   . Feeling of Stress :   Social Connections:   . Frequency of Communication with Friends and Family:   . Frequency of Social Gatherings with Friends and Family:   . Attends Religious Services:   . Active Member of Clubs or Organizations:   . Attends Banker Meetings:   Marland Kitchen Marital Status:      Family History: The patient's family history includes Breast cancer in her maternal aunt, mother, and paternal aunt; Cancer in her father; Diabetes in her father, paternal grandfather, and paternal grandmother.  ROS:   Please see the history of present illness.    Some mild lower extremity swelling.  Wants to know if she can use anorexigenic therapy.  I encouraged against this.   Palpitations are sometimes frequent and concerning.  All other systems reviewed and are negative.  EKGs/Labs/Other Studies Reviewed:    The following studies were reviewed today:  Holter monitor March 2019: Final Impression: frequent PVCs Marked bradycardia during sleep hours Occasional PACs No symptoms reported  Electronically signed by: Beecher Mcardle, M.D. 12/05/2017 1:16 PM  EKG:  EKG normal sinus rhythm, inferior infarct, old, left atrial abnormality.  When compared to January 2020, inferior T wave inversion has resolved.  Recent Labs: 11/17/2018: BUN 9; Creatinine, Ser 0.96; Potassium 3.5; Sodium 139 08/07/2019: Hemoglobin 13.9; Platelets 370  Recent Lipid Panel    Component Value Date/Time   CHOL 135 05/05/2018 0042   TRIG 85 05/05/2018  0042   HDL 40 (L) 05/05/2018 0042   CHOLHDL 3.4 05/05/2018 0042   VLDL 17 05/05/2018 0042   LDLCALC 78 05/05/2018 0042    Physical Exam:    VS:  Ht 5\' 8"  (1.727 m)   BMI 54.43 kg/m     Wt Readings from Last 3 Encounters:  08/23/19 (!) 358 lb (162.4 kg)  07/31/19 (!) 362 lb 9.6 oz (164.5 kg)  05/11/19 (!) 351 lb 12.8 oz (159.6 kg)     GEN: Morbidly obese. No acute distress HEENT: Normal NECK: No JVD. LYMPHATICS: No lymphadenopathy CARDIAC:  RRR without murmur, gallop, with trace bilateral pedal edema. VASCULAR:  Normal Pulses. No bruits. RESPIRATORY:  Clear to auscultation without rales, wheezing or rhonchi  ABDOMEN: Soft, non-tender, non-distended, No pulsatile mass, MUSCULOSKELETAL: No deformity  SKIN: Warm and dry NEUROLOGIC:  Alert and oriented x 3 PSYCHIATRIC:  Normal affect   ASSESSMENT:    1. Coronary artery disease of native artery of native heart with stable angina pectoris ([place])   2. Hx of Inf MI (7/19 Tx with BMS to RCA; stent thrombosis 9/19 tx with DES to RCA; stent thrombosis 04/2018 tx with thrombectomy)   3. OBESITY, MORBID   4. TOBACCO DEPENDENCE   5. Antiphospholipid antibody syndrome (HCC)   6.  Coronary stent thrombosis, sequela   7. Educated about COVID-19 virus infection    PLAN:    In order of problems listed above:  1. Secondary prevention discussed in detail as outlined below. 2. High risk for recurrent stent thrombosis if off antiplatelet therapy.  Continue Effient or similar medication but not Plavix (intermediate responder), lifelong. 3. Encourage physical activity including at least 30 minutes of moderate activity per week.  Advocated against anorexigenic therapy. 4. Denies tobacco use 5. For thrombophilia, continue anticoagulation with factor Xa inhibitor therapy Xarelto. 6. Stent sandwich right coronary status post thrombosis x2 most recently with AngioJet removal of thrombus but without restenting 2020. 7. Diagnosis of obstructive sleep apnea and central sleep apnea has been made.  And BiPAP. 8. Vaccine and social distancing are encouraged and is being complied with.  Overall education and awareness concerning primary/secondary risk prevention was discussed in detail: LDL less than 70, hemoglobin A1c less than 7, blood pressure target less than 130/80 mmHg, >150 minutes of moderate aerobic activity per week, avoidance of smoking, weight control (via diet and exercise), and continued surveillance/management of/for obstructive sleep apnea.    Medication Adjustments/Labs and Tests Ordered: Current medicines are reviewed at length with the patient today.  Concerns regarding medicines are outlined above.  Orders Placed This Encounter  Procedures  . EKG 12-Lead   No orders of the defined types were placed in this encounter.   There are no Patient Instructions on file for this visit.   Signed, Sinclair Grooms, MD  08/28/2019 8:53 AM    [place]

## 2019-08-28 ENCOUNTER — Encounter: Payer: Self-pay | Admitting: Interventional Cardiology

## 2019-08-28 ENCOUNTER — Other Ambulatory Visit: Payer: Self-pay

## 2019-08-28 ENCOUNTER — Ambulatory Visit: Payer: Medicaid Other | Admitting: Interventional Cardiology

## 2019-08-28 VITALS — Ht 68.0 in

## 2019-08-28 DIAGNOSIS — F17200 Nicotine dependence, unspecified, uncomplicated: Secondary | ICD-10-CM | POA: Diagnosis not present

## 2019-08-28 DIAGNOSIS — I25118 Atherosclerotic heart disease of native coronary artery with other forms of angina pectoris: Secondary | ICD-10-CM

## 2019-08-28 DIAGNOSIS — D6861 Antiphospholipid syndrome: Secondary | ICD-10-CM

## 2019-08-28 DIAGNOSIS — I252 Old myocardial infarction: Secondary | ICD-10-CM | POA: Diagnosis not present

## 2019-08-28 DIAGNOSIS — Z7189 Other specified counseling: Secondary | ICD-10-CM

## 2019-08-28 DIAGNOSIS — T82867S Thrombosis of cardiac prosthetic devices, implants and grafts, sequela: Secondary | ICD-10-CM

## 2019-08-28 NOTE — Patient Instructions (Addendum)
Medication Instructions:  Your physician recommends that you continue on your current medications as directed. Please refer to the Current Medication list given to you today.  *If you need a refill on your cardiac medications before your next appointment, please call your pharmacy*   Lab Work: None If you have labs (blood work) drawn today and your tests are completely normal, you will receive your results only by: . MyChart Message (if you have MyChart) OR . A paper copy in the mail If you have any lab test that is abnormal or we need to change your treatment, we will call you to review the results.   Testing/Procedures: None   Follow-Up: At [facility], you and your health needs are our priority.  As part of our continuing mission to provide you with exceptional heart care, we have created designated Provider Care Teams.  These Care Teams include your primary Cardiologist (physician) and Advanced Practice Providers (APPs -  Physician Assistants and Nurse Practitioners) who all work together to provide you with the care you need, when you need it.  We recommend signing up for the patient portal called "MyChart".  Sign up information is provided on this After Visit Summary.  MyChart is used to connect with patients for Virtual Visits (Telemedicine).  Patients are able to view lab/test results, encounter notes, upcoming appointments, etc.  Non-urgent messages can be sent to your provider as well.   To learn more about what you can do with MyChart, go to [email or web address].    Your next appointment:   6 month(s)  The format for your next appointment:   In Person  Provider:   You may see Henry W Smith III, MD or one of the following Advanced Practice Providers on your designated Care Team:    Lori Gerhardt, NP  Laura Ingold, NP  Jill McDaniel, NP    Other Instructions  Your provider recommends that you maintain 150 minutes per week of moderate aerobic activity.   

## 2019-08-29 ENCOUNTER — Other Ambulatory Visit: Payer: Self-pay | Admitting: Family Medicine

## 2019-08-29 ENCOUNTER — Other Ambulatory Visit: Payer: Medicaid Other

## 2019-08-29 DIAGNOSIS — I252 Old myocardial infarction: Secondary | ICD-10-CM

## 2019-09-02 NOTE — Procedures (Signed)
Patient Name: Angelica Brown, [name] Date: 08/23/2019 Gender: Female D.O.B: 10/02/1975 Age (years): 43 Referring Provider: Leeanne Rio Height (inches): 60 Interpreting Physician: Baird Lyons MD, ABSM Weight (lbs): 358 RPSGT: Heugly, [place] BMI: 54 MRN: [id number] Neck Size: 17.00  CLINICAL INFORMATION The patient is referred for a split night study with BPAP.  MEDICATIONS Medications self-administered by patient taken the night of the study : none reported  SLEEP STUDY TECHNIQUE As per the AASM Manual for the Scoring of Sleep and Associated Events v2.3 (April 2016) with a hypopnea requiring 4% desaturations.  The channels recorded and monitored were frontal, central and occipital EEG, electrooculogram (EOG), submentalis EMG (chin), nasal and oral airflow, thoracic and abdominal wall motion, anterior tibialis EMG, snore microphone, electrocardiogram, and pulse oximetry. Bi-level positive airway pressure (BiPAP) was initiated when the patient met split night criteria and was titrated according to treat sleep-disordered breathing.  RESPIRATORY PARAMETERS Diagnostic  Total AHI (/hr): 28.3 RDI (/hr): 42.6 OA Index (/hr): 10.4 CA Index (/hr): 0.0 REM AHI (/hr): 120.0 NREM AHI (/hr): 22.6 Supine AHI (/hr): 106.7 Non-supine AHI (/hr): 40.00 Min O2 Sat (%): 81.0 Mean O2 (%): 91.6 Time below 88% (min): 18.1   Titration  Optimal IPAP Pressure (cm): 24 Optimal EPAP Pressure (cm): 15 AHI at Optimal Pressure (/hr): 0.0 Min O2 at Optimal Pressure (%): 92.0 Sleep % at Optimal (%): 100 Supine % at Optimal (%): 100     SLEEP ARCHITECTURE The study was initiated at 10:17:09 PM and terminated at 5:30:27 AM. The total recorded time was 433.3 minutes. EEG confirmed total sleep time was 394.2 minutes yielding a sleep efficiency of 91.0%%. Sleep onset after lights out was 6.1 minutes with a REM latency of 68.5 minutes. The patient spent 7.5%% of the night in stage N1 sleep, 63.2%% in  stage N2 sleep, 2.9%% in stage N3 and 26.4% in REM. Wake after sleep onset (WASO) was 33.0 minutes. The Arousal Index was 17.2/hour.  LEG MOVEMENT DATA The total Periodic Limb Movements of Sleep (PLMS) were 0. The PLMS index was 0.0 .  CARDIAC DATA The 2 lead EKG demonstrated sinus rhythm. The mean heart rate was 100.0 beats per minute. Other EKG findings include: frequent PVCs with bigeminy and couplets. IMPRESSIONS - Moderate obstructive sleep apnea occurred during the diagnostic portion of the study (AHI = 28.3 /hour). - CPAP titration was not tolerated and she was changed succesfully to BIPAP titration. An optimal BiPAP pressure was selected for this patient ( 24 / 15 cm of water). - No significant central sleep apnea occurred during the diagnostic portion of the study (CAI = 0.0/hour). - Moderate oxygen desaturation was noted during the diagnostic portion of the study (Min O2 = 81.0%). Minimum saturation on BiPAP 24/15 was 92%. - The patient snored with loud snoring volume during the diagnostic portion of the study. - EKG findings included frequent PVCs including bigeminy and couplets.. - Clinically significant periodic limb movements of sleep did not occur during the study.  DIAGNOSIS - Obstructive Sleep Apnea (327.23 [G47.33 ICD-10])  RECOMMENDATIONS - Trial of BiPAP therapy on 24/15 cm H2O. - Patient used a Medium size Resmed Full Face Mask AirFit F20 mask and heated humidification. - Be careful with alcohol, sedatives and other CNS depressants that may worsen sleep apnea and disrupt normal sleep architecture. - Sleep hygiene should be reviewed to assess factors that may improve sleep quality. - Weight management and regular exercise should be initiated or continued.  [Electronically signed] 09/02/2019 11:56 AM  Baird Lyons MD, [place], American Board of Sleep Medicine   NPI: [id number]                         [place], [place] [facility]  ELECTRONICALLY SIGNED ON:  09/02/2019, 11:49 AM [place] PH: [phone]   FX: [phone] [place]

## 2019-09-28 ENCOUNTER — Telehealth: Payer: Self-pay | Admitting: Family Medicine

## 2019-09-28 NOTE — Telephone Encounter (Signed)
Pt submitted medical information and restriction assessment form to be completed; last appt 08/07/19, form placed in red team folder

## 2019-10-01 ENCOUNTER — Other Ambulatory Visit: Payer: Self-pay

## 2019-10-01 MED ORDER — TOPIRAMATE 50 MG PO TABS: 50.0000 mg | ORAL_TABLET | Freq: Every day | ORAL | 0 refills | Status: DC

## 2019-10-01 NOTE — Telephone Encounter (Signed)
Reasonable Accomodation from reviewed and placed in PCP's box for completion.  Glennie Hawk, CMA

## 2019-10-02 ENCOUNTER — Encounter: Payer: Self-pay | Admitting: *Deleted

## 2019-10-02 NOTE — Progress Notes (Signed)
RN received reasonable work accomodation from [employer] paperwork to be completed for pt.  RN placed paperwork in appropriate triage location for FMLA/disabiity representative to complete and send back.

## 2019-10-03 NOTE — Telephone Encounter (Signed)
Spoke with patient.  She has requested the same information from her other physicians (cardiologists)  I let her know Dr Pollie Meyer would complete on her return.  She was grateful

## 2019-10-04 ENCOUNTER — Ambulatory Visit: Payer: Medicaid Other

## 2019-10-10 ENCOUNTER — Telehealth: Payer: Self-pay | Admitting: *Deleted

## 2019-10-10 NOTE — Telephone Encounter (Signed)
Completed reasonable work accommodation form.  Envelope and letter left by patient reads "Angelica Brown, formerly [place]."  Name added at this time.

## 2019-10-15 DIAGNOSIS — G4733 Obstructive sleep apnea (adult) (pediatric): Secondary | ICD-10-CM | POA: Diagnosis not present

## 2019-10-15 NOTE — Telephone Encounter (Signed)
Patient is calling to check on the status of Dr. Pollie Meyer completing this form. Patient states it was due on 10/12/19 but her meeting is Wednesday and would like to have it as soon as possible.   She would like the form to be faxed to the number on form once completed. She would also like for someone to call her to inform her when form has been completed and faxed.

## 2019-10-15 NOTE — Telephone Encounter (Signed)
Called patient, apologized for delay in response. I have completed her paperwork and faxed it to the number she needed.  I have placed originals in the [facility] RN inbasket. They will need to be scanned into the system, and a copy made for patient to pick up.  Thanks, Latrelle Dodrill, MD

## 2019-10-16 NOTE — Telephone Encounter (Signed)
Copy placed in batch scanning. Original placed up front for pick up.   Veronda Prude, RN

## 2019-10-18 ENCOUNTER — Telehealth: Payer: Self-pay | Admitting: Family Medicine

## 2019-10-18 DIAGNOSIS — G4733 Obstructive sleep apnea (adult) (pediatric): Secondary | ICD-10-CM | POA: Diagnosis not present

## 2019-10-18 NOTE — Telephone Encounter (Signed)
Patient is dropping of her job accomodation forms to be completed by Dr. Pollie Meyer. Last date of service 07/31/19.   She would like one copy faxed to the number listed and then a copy for herself left at the front desk. Please call when completed. [phone]  I have placed form in red team folder.

## 2019-10-19 NOTE — Telephone Encounter (Signed)
Called patient as what she dropped off was a long letter requesting all 5 of her physicians (in both [place] and [place]) to have a phone conference to clarify her work restrictions. Advised this would be nearly impossible to arrange.   She prefers restrictions be listed as what I originally wrote on the form and will plan to submit just my documentation as her PCP. I have addended the form with today's date and will return it to [facility] RN team to re-fax back to her employer.  Patient will need a copy of newly addended form to pick up and all documentation in the packet will need to be scanned into her chart.  Thanks Latrelle Dodrill, MD

## 2019-10-19 NOTE — Telephone Encounter (Signed)
Reviewed form and placed in PCP's box for completion.  .Gloriann Riede R Donzel Romack, CMA  

## 2019-10-23 NOTE — Telephone Encounter (Signed)
Faxed to provided number. Copy placed in batch scanning. Patient called and informed that paper work had been completed and was ready for pick up.   Veronda Prude, RN

## 2019-10-26 ENCOUNTER — Telehealth: Payer: Self-pay | Admitting: *Deleted

## 2019-10-26 NOTE — Telephone Encounter (Signed)
Angelica Brown from Dr. Earmon Phoenix office calls and would like to speak to Dr. Pollie Meyer to get an idea of how she will proceed with the reasonable accomodation form.  She request that Dr. Farley Ly Dr. Pamelia Hoit to help clarify request.    To PCP, Adelina Mings aware the PCP is unavailable until next week. Jone Baseman, CMA

## 2019-10-29 ENCOUNTER — Other Ambulatory Visit: Payer: Self-pay

## 2019-10-29 ENCOUNTER — Ambulatory Visit (INDEPENDENT_AMBULATORY_CARE_PROVIDER_SITE_OTHER): Payer: Medicaid Other | Admitting: Family Medicine

## 2019-10-29 VITALS — BP 112/60 | HR 84

## 2019-10-29 DIAGNOSIS — J069 Acute upper respiratory infection, unspecified: Secondary | ICD-10-CM

## 2019-10-29 DIAGNOSIS — S46001A Unspecified injury of muscle(s) and tendon(s) of the rotator cuff of right shoulder, initial encounter: Secondary | ICD-10-CM | POA: Diagnosis not present

## 2019-10-29 DIAGNOSIS — G8911 Acute pain due to trauma: Secondary | ICD-10-CM | POA: Diagnosis not present

## 2019-10-29 MED ORDER — FLUTICASONE PROPIONATE 50 MCG/ACT NA SUSP: 2.0000 | Freq: Every day | NASAL | 6 refills | Status: DC

## 2019-10-29 MED ORDER — CETIRIZINE HCL 10 MG PO TABS: 10.0000 mg | ORAL_TABLET | Freq: Every day | ORAL | 11 refills | Status: DC | PRN

## 2019-10-29 NOTE — Assessment & Plan Note (Signed)
Patient with symtpoms concerning for covid-19 given loss of sense of taste. Patient reports cough, congestion, sore throat, loss of taste.  Denies shortness of breath, fevers, sick contacts. Most likely viral in origin, Will give Zyrtec and Flonase to use in case of allergic component. Patient given return precautions, if no improvement in the next 4 days to call back. Would consider treating for bacterial sinusitis at that time. -Instructed patient to call to schedule COVID test by texting "COVID" to [id number] OR log onto [email or web address] Patient can also call [phone].  -Counseled on wearing a mask, washing hands and avoiding social gatherings  -ED precautions discussed and patient expressed good understanding -Patient instructed to avoid others until they meet criteria for ending isolation after any suspected COVID, which are:  -24 hours with no fever (without use of medicaitons) and -respiratory symptoms have improved (e.g. cough, shortness of breath) or  -10 days since symptoms first appeared

## 2019-10-29 NOTE — Patient Instructions (Signed)
We recommend you undergo testing for COVID19. This is by appointment only. There are three locations and hours vary.   Text "COVID" to [id number] OR log onto [email or web address].  You can also call [phone].   There are three locations for testing, hours vary.   [facility] (old [facility] in [place]) [place]. [place], [place] [address] Hours 8 AM until 3:30 PM  or [facility]) [place], [place] [address] or [address] (near [facility] in [place]) [place], [place] [address]   You should quarantine (isolate at home) until the following criteria are met: At least 10 days since symptoms first appeared and At least 24 hours with no fever without fever-reducing medication and Other symptoms of COVID-19 are improving (Loss of taste and smell may persist for weeks or months after recovery and need not delay the end of isolation)  When to seek emergency medical attention Look for emergency warning signs* for COVID-19. If your or someone you know is showing any of these signs, seek emergency medical care immediately:  Trouble breathing Persistent pain or pressure in the chest New confusion Inability to wake or stay awake Bluish lips or face Inability to tolerate fluids  *This list is not all possible symptoms. We discussed additional symptoms   Call 911 or call ahead to your local emergency facility: Notify the operator that you are seeking care for someone who has or may have COVID-19.  I have sent allergy medicine and flonase.  Use this for your symptoms.

## 2019-10-29 NOTE — Telephone Encounter (Signed)
Messaged Dr. Pamelia Hoit in epic  Latrelle Dodrill, MD

## 2019-10-29 NOTE — Progress Notes (Signed)
    SUBJECTIVE:   CHIEF COMPLAINT / HPI:   Cough, sore throat, congestion 2 nights ago, throat felt scrtachy and itchy Yesterday morning her throat felt worse - more itchy but also scratchy Nose with clear rhinorrhea Took her daughter's allergy medication and flonase, also took dayquil Helped her throat, stopped hurting yesterday Now she continues to have congestion Lost taste last night She can still smell Coughing and when it comes up it is very thick and green No shortness of breath, chest pain Currently smokes 1/2 ppd  No asthma or COPD Went to ED last PM because dog hurt her arm, had temp in 99s No fevers No known sick contacts Got second COVID vaccination today Has not been around anyone with COVID   PERTINENT  PMH / PSH: Tobacco abuse, CHF, history of MI, antiphospholipid antibody syndrome, dural venous sinus thrombosis, sickle cell trait, OSA  OBJECTIVE:   BP 112/60   Pulse 84   SpO2 95%    Physical Exam:  General: 44 y.o. female in NAD HEENT: NCAT, MMM, throat with cobblestoning and non-erythematous, swollen erythematous nasal turbinates, right TM bulging, non-erythematous without effusion, left TM clear and non-bulging Cardio: RRR no m/r/g Lungs: CTAB, no wheezing, no rhonchi, no crackles, no IWOB on RA Skin: warm and dry   ASSESSMENT/PLAN:   Viral URI with cough Patient with symtpoms concerning for covid-19 given loss of sense of taste. Patient reports cough, congestion, sore throat, loss of taste.  Denies shortness of breath, fevers, sick contacts. Most likely viral in origin, Will give Zyrtec and Flonase to use in case of allergic component. Patient given return precautions, if no improvement in the next 4 days to call back. Would consider treating for bacterial sinusitis at that time. -Instructed patient to call to schedule COVID test by texting "COVID" to [id number] OR log onto [email or web address] Patient can also call [phone].  -Counseled on wearing a  mask, washing hands and avoiding social gatherings  -ED precautions discussed and patient expressed good understanding -Patient instructed to avoid others until they meet criteria for ending isolation after any suspected COVID, which are:  -24 hours with no fever (without use of medicaitons) and -respiratory symptoms have improved (e.g. cough, shortness of breath) or  -10 days since symptoms first appeared       Unknown Jim, DO [facility]

## 2019-10-31 ENCOUNTER — Ambulatory Visit: Payer: Medicaid Other | Attending: Internal Medicine

## 2019-10-31 DIAGNOSIS — Z20822 Contact with and (suspected) exposure to covid-19: Secondary | ICD-10-CM

## 2019-11-01 ENCOUNTER — Telehealth: Payer: Self-pay

## 2019-11-01 LAB — SARS-COV-2, NAA 2 DAY TAT

## 2019-11-01 LAB — NOVEL CORONAVIRUS, NAA: SARS-CoV-2, NAA: NOT DETECTED

## 2019-11-01 NOTE — Telephone Encounter (Signed)
Call patient.  She states that another provider looked in her ear yesterday when she was here for her daughter's well-child check and was told that her ear looked infected.  On my exam on 7/12, reiterated to patient that while her tympanic membrane was bulging, it was nonerythematous and there was no effusion, therefore there was no sign of infection.  There is also no sign of mastoiditis on this examination.  Advised patient that she would like she can go to urgent care today, or she can call tomorrow for an appointment to be reexamined to see if she needs antibiotics.  She states that she will call tomorrow.

## 2019-11-01 NOTE — Telephone Encounter (Signed)
Patient Angelica Brown on nurse line reporting she tested negative for Covid. Patient reports she is still having a lot of ear pain and was told an antibiotic could be called in with a negative result. Patient is requesting this to be sent to [facility]. Will forward to provider who saw patient. Please advise.

## 2019-11-02 ENCOUNTER — Ambulatory Visit (INDEPENDENT_AMBULATORY_CARE_PROVIDER_SITE_OTHER): Payer: Medicaid Other | Admitting: Family Medicine

## 2019-11-02 DIAGNOSIS — Z5329 Procedure and treatment not carried out because of patient's decision for other reasons: Secondary | ICD-10-CM

## 2019-11-02 NOTE — Progress Notes (Signed)
No Show

## 2019-11-07 ENCOUNTER — Encounter: Payer: Self-pay | Admitting: Family Medicine

## 2019-11-07 ENCOUNTER — Ambulatory Visit (INDEPENDENT_AMBULATORY_CARE_PROVIDER_SITE_OTHER): Payer: Medicaid Other | Admitting: Family Medicine

## 2019-11-07 ENCOUNTER — Other Ambulatory Visit: Payer: Self-pay

## 2019-11-07 DIAGNOSIS — H60331 Swimmer's ear, right ear: Secondary | ICD-10-CM

## 2019-11-07 DIAGNOSIS — H6090 Unspecified otitis externa, unspecified ear: Secondary | ICD-10-CM | POA: Insufficient documentation

## 2019-11-07 MED ORDER — NITROGLYCERIN 0.4 MG SL SUBL: 0.4000 mg | SUBLINGUAL_TABLET | SUBLINGUAL | 12 refills | Status: DC | PRN

## 2019-11-07 MED ORDER — NEOMYCIN-POLYMYXIN-HC 3.5-10000-1 OT SOLN
3.0000 [drp] | Freq: Four times a day (QID) | OTIC | 0 refills | Status: AC
Start: 2019-11-07 — End: 2019-11-14

## 2019-11-07 NOTE — Assessment & Plan Note (Signed)
Assessment: Acute otitis externa of the right ear in a patient who is recently returned from the beach with pain in the ear since then.  Patient states she has been a lot of time in the pool and by the pool.  Also recently had a viral URI but tympanic membranes do not show signs of infection. Plan: -Provided antibiotic eardrops, Cortisporin to use for 7 days -Recommend patient follow-up if does not improve in the next 7 days -Provided patient handout on otitis externa

## 2019-11-07 NOTE — Progress Notes (Signed)
    SUBJECTIVE:   CHIEF COMPLAINT / HPI:   Concern for ear infection Patient is a 44 year old female the presents today for concern of ear infection.  Patient recently seen on 7/12 with viral upper respiratory tract infection.  At that time the tympanic membrane was bulging but not erythematous per documentation without effusion. Been having ear pressure for about 2-3 weeks now since having her URI. Was COVID negative at that time. Still having some congestion and cough but it's improving. Ear pressure isn't as bad now but still has pain in the canal.  Patient states she recently got back from the beach and has been a lot of time in the pool in the past few weeks.  Patient states her biggest symptom right now is pain inside the right ear.  PERTINENT  PMH / PSH: Recent URI  OBJECTIVE:   BP 122/80   Pulse 86   Ht 5\' 8"  (1.727 m)   Wt (!) 358 lb (162.4 kg)   SpO2 98%   BMI 54.43 kg/m      Office Visit from 11/07/2019 in [facility]  PHQ-9 Total Score 0     General: NAD, pleasant, able to participate in exam HEENT: Right ear canal with some erythema and tenderness on insertion of otoscope, tympanic membranes not erythematous, nonbulging bilaterally Cardiac: RRR, no murmurs. Respiratory: CTAB, normal effort Psych: Normal affect and mood  ASSESSMENT/PLAN:   Otitis externa Assessment: Acute otitis externa of the right ear in a patient who is recently returned from the beach with pain in the ear since then.  Patient states she has been a lot of time in the pool and by the pool.  Also recently had a viral URI but tympanic membranes do not show signs of infection. Plan: -Provided antibiotic eardrops, Cortisporin to use for 7 days -Recommend patient follow-up if does not improve in the next 7 days -Provided patient handout on otitis externa   -Provided patient refill for her nitroglycerin pills  [place], DO [facility]

## 2019-11-07 NOTE — Patient Instructions (Addendum)
Our plans for today:  -I am prescribing antibiotic eardrops for your "swimmers ear". Use the drops in your right ear for 7 days -Please return if this does not improve your symptoms or if your symptoms worsen or change -I am also providing a refill for your nitroglycerin -I expect that you should be feeling better in the next 2 to 3 days  Take care and seek immediate care sooner if you develop any concerns.   Dr. [facility]  Otitis Externa  Otitis externa is an infection of the outer ear canal. The outer ear canal is the area between the outside of the ear and the eardrum. Otitis externa is sometimes called swimmer's ear. What are the causes? Common causes of this condition include:  Swimming in dirty water.  Moisture in the ear.  An injury to the inside of the ear.  An object stuck in the ear.  A cut or scrape on the outside of the ear. What increases the risk? You are more likely to get this condition if you go swimming often. What are the signs or symptoms?  Itching in the ear. This is often the first symptom.  Swelling of the ear.  Redness in the ear.  Ear pain. The pain may get worse when you pull on your ear.  Pus coming from the ear. How is this treated? This condition may be treated with:  Antibiotic ear drops. These are often given for 10-14 days.  Medicines to reduce itching and swelling. Follow these instructions at home:  If you were given antibiotic ear drops, use them as told by your doctor. Do not stop using them even if your condition gets better.  Take over-the-counter and prescription medicines only as told by your doctor.  Avoid getting water in your ears as told by your doctor. You may be told to avoid swimming or water sports for a few days.  Keep all follow-up visits as told by your doctor. This is important. How is this prevented?  Keep your ears dry. Use the corner of a towel to dry your ears after you swim or  bathe.  Try not to scratch or put things in your ear. Doing these things makes it easier for germs to grow in your ear.  Avoid swimming in lakes, dirty water, or pools that may not have the right amount of a chemical called chlorine. Contact a doctor if:  You have a fever.  Your ear is still red, swollen, or painful after 3 days.  You still have pus coming from your ear after 3 days.  Your redness, swelling, or pain gets worse.  You have a really bad headache.  You have redness, swelling, pain, or tenderness behind your ear. Summary  Otitis externa is an infection of the outer ear canal.  Symptoms include pain, redness, and swelling of the ear.  If you were given antibiotic ear drops, use them as told by your doctor. Do not stop using them even if your condition gets better.  Try not to scratch or put things in your ear. This information is not intended to replace advice given to you by your health care provider. Make sure you discuss any questions you have with your health care provider. Document Revised: 09/09/2017 Document Reviewed: 09/09/2017 Elsevier Patient Education  [employer]. It was great to see you!

## 2019-11-18 DIAGNOSIS — G4733 Obstructive sleep apnea (adult) (pediatric): Secondary | ICD-10-CM | POA: Diagnosis not present

## 2019-11-22 ENCOUNTER — Other Ambulatory Visit: Payer: Self-pay

## 2019-11-22 ENCOUNTER — Encounter: Payer: Self-pay | Admitting: Family Medicine

## 2019-11-22 ENCOUNTER — Ambulatory Visit (INDEPENDENT_AMBULATORY_CARE_PROVIDER_SITE_OTHER): Payer: Medicaid Other | Admitting: Family Medicine

## 2019-11-22 DIAGNOSIS — G4733 Obstructive sleep apnea (adult) (pediatric): Secondary | ICD-10-CM

## 2019-11-22 DIAGNOSIS — L219 Seborrheic dermatitis, unspecified: Secondary | ICD-10-CM | POA: Diagnosis not present

## 2019-11-22 DIAGNOSIS — R7303 Prediabetes: Secondary | ICD-10-CM

## 2019-11-22 LAB — POCT GLYCOSYLATED HEMOGLOBIN (HGB A1C): HbA1c, POC (prediabetic range): 5.7 % (ref 5.7–6.4)

## 2019-11-22 NOTE — Patient Instructions (Signed)
Call the [facility] to set up an appointment for weight management. Their number is [phone].   Checking A1c today  Referring to dermatology  Call with questions  Follow up with me in 3 months, sooner if needed  Be well, Dr. Pollie Meyer

## 2019-11-22 NOTE — Progress Notes (Signed)
21 

## 2019-11-27 DIAGNOSIS — R7303 Prediabetes: Secondary | ICD-10-CM | POA: Insufficient documentation

## 2019-11-27 NOTE — Progress Notes (Signed)
  Date of Visit: 11/22/2019   SUBJECTIVE:   HPI:  Angelica Brown presents today for an A1c.  Reports her cardiologist told her she has prediabetes and needs her A1c checked.  OSA - using bipap at night, feels much better during the day and more well rested. Compliant with wearing it.  Skin flaking - requests another referral to dermatology for flaking skin on face. Prev was referred in Jan 2020 but was unable to go because she moved back to [place].  Obesity - wants to lose weight. Admits to eating one large meal per day, not always the healthiest options. Agreeable to intensive lifestyle changes.  OBJECTIVE:   BP 115/70   Pulse 73   Ht 5' 8.5" (1.74 m)   Wt (!) 365 lb (165.6 kg)   SpO2 96%   BMI 54.69 kg/m  Gen: no acute distress, pleasant, cooperative HEENT: normocephalic, atraumatic  Lungs: normal work of breathing  Neuro: alert, speech normal  ASSESSMENT/PLAN:   Health maintenance:  -current on HM items  OSA (obstructive sleep apnea) Compliant with bipap, doing well, continue.  OBESITY, MORBID Gave info for [facility], explained intensive nature of the program. She is interested and will call.  Seborrheic dermatitis Refer to derm per patient preference  Check A1c today  FOLLOW UP: Follow up in 3 months for above issues  [name], MD [facility]

## 2019-11-27 NOTE — Assessment & Plan Note (Signed)
Gave info for [facility], explained intensive nature of the program. She is interested and will call.

## 2019-11-27 NOTE — Assessment & Plan Note (Signed)
Compliant with bipap, doing well, continue.

## 2019-11-27 NOTE — Assessment & Plan Note (Signed)
Refer to derm per patient preference

## 2019-11-28 ENCOUNTER — Other Ambulatory Visit: Payer: Self-pay

## 2019-11-28 ENCOUNTER — Encounter: Payer: Self-pay | Admitting: Family Medicine

## 2019-11-28 ENCOUNTER — Ambulatory Visit (INDEPENDENT_AMBULATORY_CARE_PROVIDER_SITE_OTHER): Payer: Medicaid Other | Admitting: Family Medicine

## 2019-11-28 VITALS — BP 126/60 | HR 84 | Ht 68.0 in | Wt 365.0 lb

## 2019-11-28 DIAGNOSIS — H814 Vertigo of central origin: Secondary | ICD-10-CM

## 2019-11-28 DIAGNOSIS — Z86718 Personal history of other venous thrombosis and embolism: Secondary | ICD-10-CM

## 2019-11-28 DIAGNOSIS — R42 Dizziness and giddiness: Secondary | ICD-10-CM

## 2019-11-28 LAB — GLUCOSE, POCT (MANUAL RESULT ENTRY): POC Glucose: 105 mg/dl — AB (ref 70–99)

## 2019-11-28 MED ORDER — MECLIZINE HCL 12.5 MG PO TABS: 12.5000 mg | ORAL_TABLET | Freq: Three times a day (TID) | ORAL | 0 refills | Status: DC | PRN

## 2019-11-28 NOTE — Progress Notes (Signed)
   SUBJECTIVE:   CHIEF COMPLAINT / HPI:   Chief Complaint  Patient presents with  . Dizziness     Angelica Brown Caller is a 44 y.o. female here for itchiness  Dizziness States that yesterday morning she had room spinning dizziness with blurred vision.  Reports that she sat up and fell back onto her husband shortly after waking up.  Dizziness is worse when she turns to the right and lays on her back.  Has been having fatigue, nausea and congestion with green-white mucus for the past week.  She took Mucinex and Sudafed without relief.  Has had a stinky burning to her nostrils and reports a similar headache to her previous mastoiditis and venous cavernous sinus thrombosis.  States that headache is not like her migraine headaches.  Reports she has swimmer's ear in her right ear a couple weeks that got a little better with antibiotics. patient with hx of postural hypotension, dural venous sinus thrombus, mastoiditis, antiphospholipid syndrome, lupus.   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   OBJECTIVE:   BP: 126/60, HR: 64, O2 saturation 96%, weight: 365 pounds Ht 5\' 8"  (1.727 m)   BMI 55.50 kg/m    GEN:     Well-developed, well-nourished female who is alert and in no distress HENT:  mucus membranes moist, oropharyngeal without lesions or erythema,  nares patent, no nasal discharge, TM: Right minimal erythema, left normal TM, external canals normal, no mastoid tenderness appreciated, no periauricular lymphadenopathy EYES:   pupils equal and reactive, EOM intact, no nystagmus NECK:  supple, normal ROM, no lymphadenopathy RESP:  clear to auscultation bilaterally, no increased work of breathing  CVS:   regular rate and rhythm, no murmur, distal pulses intact  ABD:  soft, obese abdomen, non-tender; bowel sounds present EXT:   normal ROM, atraumatic, no edema  NEURO:  alert and oriented x4,  normal strength upper and lower  extremities with reflexes 2/4, gross sensation intact, positive Romberg, normal finger-to-nose Skin:   warm and dry, no rash, normal skin turgor Psych: Normal affect, appropriate speech and behavior     ASSESSMENT/PLAN:   Dizziness Differential diagnosis includes venous sinus thrombus, TIA, CVA, hypoglycemia, vasovagal sympathy, dehydration, medication induced dizziness, vertigo, otitis media and cardiomyopathy.  Previous Halter monitor in 2008 with PVC/PACs  and ECHO reviewed and were unremarkable.  Obtain STAT MR and MRV brain given patient's history of dural venous sinus thrombus and mastoiditis.  Will start meclizine to see if it will help with symptoms.  Obtain BMP, CMP and CBG.  -ED precautions given. -Follow-up stat imaging      2009, DO PGY-2, [facility] 12/10/2019

## 2019-11-28 NOTE — Patient Instructions (Signed)
It was great seeing you today! Please check-out at the front desk before leaving the clinic.   Visit Remembers: - Stop by the pharmacy to pick up your prescriptions  - Continue to work on your healthy eating habits and incorporating exercise into your daily life.  - Medicine Changes: Start taking Meclizine for dizziness three times a day as needed  - To Do: Get your head imaging (MRI/MRV) completed as soon as possible.  If your dizziness gets worse or is accompanied by fever, chest pain or shortness of breath call EMS and go directly to the emergency department for immediate care.    Regarding lab work today:  Due to recent changes in healthcare laws, you may see the results of your imaging and laboratory studies on MyChart before your provider has had a chance to review them.  I understand that in some cases there may be results that are confusing or concerning to you. Not all laboratory results come back in the same time frame and you may be waiting for multiple results in order to interpret others.  Please give [place] 72 hours in order for your provider to thoroughly review all the results before contacting the office for clarification of your results. If everything is normal, you will get a letter in the mail or a message in My Chart. Please give [place] a call if you do not hear from [place] after 2 weeks.  Please bring all of your medications with you to each visit.    If you haven't already, sign up for My Chart to have easy access to your labs results, and communication with your primary care physician.  Feel free to call with any questions or concerns at any time, at [phone].   Take care,  Dr. Katherina Right [facility]

## 2019-11-29 ENCOUNTER — Ambulatory Visit (HOSPITAL_COMMUNITY): Admission: RE | Admit: 2019-11-29 | Payer: Medicaid Other | Source: Ambulatory Visit

## 2019-11-29 ENCOUNTER — Telehealth: Payer: Self-pay | Admitting: *Deleted

## 2019-11-29 ENCOUNTER — Ambulatory Visit (HOSPITAL_COMMUNITY)
Admission: RE | Admit: 2019-11-29 | Discharge: 2019-11-29 | Disposition: A | Discharge status: Home / Self Care | Payer: Medicaid Other | Source: Ambulatory Visit | Attending: Family Medicine | Admitting: Family Medicine

## 2019-11-29 DIAGNOSIS — R42 Dizziness and giddiness: Secondary | ICD-10-CM | POA: Insufficient documentation

## 2019-11-29 DIAGNOSIS — H814 Vertigo of central origin: Secondary | ICD-10-CM

## 2019-11-29 DIAGNOSIS — R519 Headache, unspecified: Secondary | ICD-10-CM | POA: Diagnosis not present

## 2019-11-29 DIAGNOSIS — Z86718 Personal history of other venous thrombosis and embolism: Secondary | ICD-10-CM | POA: Diagnosis not present

## 2019-11-29 LAB — BASIC METABOLIC PANEL
BUN/Creatinine Ratio: 15 (ref 9–23)
BUN: 12 mg/dL (ref 6–24)
CO2: 24 mmol/L (ref 20–29)
Calcium: 9.6 mg/dL (ref 8.7–10.2)
Chloride: 104 mmol/L (ref 96–106)
Creatinine, Ser: 0.78 mg/dL (ref 0.57–1.00)
GFR calc Af Amer: 108 mL/min/{1.73_m2} (ref >59)
GFR calc non Af Amer: 94 mL/min/{1.73_m2} (ref >59)
Glucose: 87 mg/dL (ref 65–99)
Potassium: 4.3 mmol/L (ref 3.5–5.2)
Sodium: 140 mmol/L (ref 134–144)

## 2019-11-29 LAB — CBC
Hematocrit: 42.6 % (ref 34.0–46.6)
Hemoglobin: 13.7 g/dL (ref 11.1–15.9)
MCH: 27.8 pg (ref 26.6–33.0)
MCHC: 32.2 g/dL (ref 31.5–35.7)
MCV: 87 fL (ref 79–97)
Platelets: 353 10*3/uL (ref 150–450)
RBC: 4.92 x10E6/uL (ref 3.77–5.28)
RDW: 15.7 % — ABNORMAL HIGH (ref 11.7–15.4)
WBC: 13.7 10*3/uL — ABNORMAL HIGH (ref 3.4–10.8)

## 2019-11-29 MED ORDER — GADOBUTROL 1 MMOL/ML IV SOLN
10.0000 mL | Freq: Once | INTRAVENOUS | Status: AC | PRN
  Administered 2019-11-29: 10 mL via INTRAVENOUS

## 2019-11-29 NOTE — Telephone Encounter (Signed)
Called patient, discussed MRI results.  MRI shows no evidence of intracranial clot or other acute abnormality.  Good news overall.  Patient still reporting vertigo, especially worse when she turns her head to the right or is in certain positions.  Other times has no dizziness.  Suspect BPPV. Tried meclizine without relief. Offered referral to PT vestibular rehab, which patient is agreeable to.  Referral entered.  FYI to Dr. Rachael Darby.  Latrelle Dodrill, MD

## 2019-11-29 NOTE — Telephone Encounter (Signed)
Pt would like to know the results of her MRI asap.  To PCP. Jone Baseman, CMA

## 2019-12-10 ENCOUNTER — Encounter: Payer: Self-pay | Admitting: Family Medicine

## 2019-12-10 NOTE — Assessment & Plan Note (Addendum)
Differential diagnosis includes venous sinus thrombus, TIA, CVA, hypoglycemia, vasovagal sympathy, dehydration, medication induced dizziness, vertigo, otitis media and cardiomyopathy.  Previous Halter monitor in 2008 with PVC/PACs  and ECHO reviewed and were unremarkable.  Obtain STAT MR and MRV brain given patient's history of dural venous sinus thrombus and mastoiditis.  Will start meclizine to see if it will help with symptoms.  Obtain BMP, CMP and CBG.  -ED precautions given. -Follow-up stat imaging

## 2019-12-19 DIAGNOSIS — G4733 Obstructive sleep apnea (adult) (pediatric): Secondary | ICD-10-CM | POA: Diagnosis not present

## 2020-01-15 ENCOUNTER — Other Ambulatory Visit: Payer: Self-pay | Admitting: Family Medicine

## 2020-01-15 DIAGNOSIS — R42 Dizziness and giddiness: Secondary | ICD-10-CM

## 2020-01-18 ENCOUNTER — Other Ambulatory Visit: Payer: Self-pay | Admitting: *Deleted

## 2020-01-18 MED ORDER — METOPROLOL TARTRATE 25 MG PO TABS
ORAL_TABLET | ORAL | 3 refills | Status: DC
Start: 2020-01-18 — End: 2022-03-22

## 2020-02-20 ENCOUNTER — Other Ambulatory Visit: Payer: Self-pay | Admitting: *Deleted

## 2020-02-20 NOTE — Patient Outreach (Signed)
Care Coordination  02/20/2020  Naylene Foell Cooper-Worley 1975/12/23 [id number]   An unsuccessful telephone outreach was attempted today. The patient was referred to the case management team for assistance with care management and care coordination.   Follow Up Plan: A HIPAA compliant phone message was left for the patient providing contact information and requesting a return call.  The Managed Medicaid care management team will reach out to the patient again over the next 7-14 days.   Estanislado Emms RN, BSN [place]  [facility]

## 2020-02-20 NOTE — Patient Instructions (Signed)
Visit Information  Ms. Sanyla Y Cooper-Worley  - as a part of your Medicaid benefit, you are eligible for care management and care coordination services at no cost or copay. I was unable to reach you by phone today but would be happy to help you with your health related needs. Please feel free to call me @ [phone].   A member of the Managed Medicaid care management team will reach out to you again over the next 7-14 days.   Estanislado Emms RN, BSN [place]  [facility]

## 2020-03-04 ENCOUNTER — Other Ambulatory Visit: Payer: Self-pay | Admitting: *Deleted

## 2020-03-04 ENCOUNTER — Other Ambulatory Visit: Payer: Self-pay

## 2020-03-04 NOTE — Patient Outreach (Signed)
Care Coordination  03/04/2020  Niki Payment Cooper-Worley 1975/12/30 [id number]   The High Risk Managed Medicaid Care Team reached out to notify Ms Cooper-Worley of free services for case management and care coordination. This appointment was rescheduled to coordinate with Ms. Cooper-Worley's schedule for 03/12/20 @ 11:30am.  Estanislado Emms RN, BSN [place]  [facility] RN Care Coordinator

## 2020-03-12 ENCOUNTER — Other Ambulatory Visit: Payer: Self-pay | Admitting: *Deleted

## 2020-03-12 ENCOUNTER — Other Ambulatory Visit: Payer: Self-pay

## 2020-03-12 NOTE — Patient Instructions (Signed)
Visit Information  Ms. Cooper-Worley was given information about Medicaid Managed Care team care coordination services as a part of their Healthy [facility] Medicaid benefit. Zareen Y Cooper-Worley verbally consented to engagement with the [facility] Managed Care team.   The High Risk Managed Medicaid contacted Kaavya Cooper-Worley at arranged appointment time to notify her of a free benefit for Case Management and Care Coordination. Patient unable to keep this appointment time and requested to reschedule. A new appointment was made during this phone call for 03/20/20 @ 10am.    For questions related to your Healthy [facility] health plan, please call: [phone] or visit the homepage here: [email or web address]  If you would like to schedule transportation through your Healthy [facility] plan, please call the following number at least 2 days in advance of your appointment: [phone]   The patient has been provided with contact information for the Managed Medicaid care management team and has been advised to call with any health related questions or concerns.  Telephone follow up appointment with Managed Medicaid care management team member scheduled for:03/20/20 @ 10am  Estanislado Emms RN, BSN [place]   [facility]

## 2020-03-12 NOTE — Patient Outreach (Signed)
Care Coordination  03/12/2020  Kori Goins Cooper-Worley 1975/09/27 [id number]    The High Risk Managed Medicaid contacted Anabelle Cooper-Worley at arranged appointment time to notify her of a free benefit for Case Management and Care Coordination. Patient unable to keep this appointment time and requested to reschedule. A new appointment was made during this phone call for 03/20/20 @ 10am.   Estanislado Emms RN, BSN [place]  [facility] RN Care Coordinator

## 2020-03-20 ENCOUNTER — Other Ambulatory Visit: Payer: Self-pay | Admitting: *Deleted

## 2020-03-20 NOTE — Patient Outreach (Signed)
Care Coordination  03/20/2020  Angelica Brown 07/04/1975 [id number]   Third unsuccessful telephone outreach was attempted today. The patient was referred to the case management team for assistance with care management and care coordination. The patient's primary care provider has been notified of our unsuccessful attempts to make or maintain contact with the patient. The care management team is pleased to engage with this patient at any time in the future should he/she be interested in assistance from the care management team.   Follow Up Plan: The Managed Medicaid care management team is available to follow up with the patient after provider conversation with the patient regarding recommendation for care management engagement and subsequent re-referral to the care management team.   Estanislado Emms RN, BSN    [facility] RN Care Coordinator

## 2020-03-27 ENCOUNTER — Ambulatory Visit: Payer: Medicaid Other | Admitting: Family Medicine

## 2020-04-17 DIAGNOSIS — I809 Phlebitis and thrombophlebitis of unspecified site: Secondary | ICD-10-CM | POA: Diagnosis not present

## 2020-04-17 DIAGNOSIS — M79602 Pain in left arm: Secondary | ICD-10-CM | POA: Diagnosis not present

## 2020-04-17 DIAGNOSIS — K219 Gastro-esophageal reflux disease without esophagitis: Secondary | ICD-10-CM | POA: Diagnosis not present

## 2020-04-17 DIAGNOSIS — M79605 Pain in left leg: Secondary | ICD-10-CM | POA: Diagnosis not present

## 2020-04-17 DIAGNOSIS — Z881 Allergy status to other antibiotic agents status: Secondary | ICD-10-CM | POA: Diagnosis not present

## 2020-04-17 DIAGNOSIS — R7989 Other specified abnormal findings of blood chemistry: Secondary | ICD-10-CM | POA: Diagnosis not present

## 2020-04-17 DIAGNOSIS — I808 Phlebitis and thrombophlebitis of other sites: Secondary | ICD-10-CM | POA: Diagnosis not present

## 2020-04-17 DIAGNOSIS — Z888 Allergy status to other drugs, medicaments and biological substances status: Secondary | ICD-10-CM | POA: Diagnosis not present

## 2020-04-17 DIAGNOSIS — Z79899 Other long term (current) drug therapy: Secondary | ICD-10-CM | POA: Diagnosis not present

## 2020-04-17 DIAGNOSIS — F17210 Nicotine dependence, cigarettes, uncomplicated: Secondary | ICD-10-CM | POA: Diagnosis not present

## 2020-04-17 DIAGNOSIS — M7989 Other specified soft tissue disorders: Secondary | ICD-10-CM | POA: Diagnosis not present

## 2020-04-17 DIAGNOSIS — Z880 Allergy status to penicillin: Secondary | ICD-10-CM | POA: Diagnosis not present

## 2020-04-17 DIAGNOSIS — Z91018 Allergy to other foods: Secondary | ICD-10-CM | POA: Diagnosis not present

## 2020-04-21 ENCOUNTER — Other Ambulatory Visit: Payer: Self-pay | Admitting: Hematology and Oncology

## 2020-04-24 IMAGING — CT CT RENAL STONE PROTOCOL
2 of 4 series · 16 of 46 positions shown, 18 images · non-contrast
Comparison: None.

CLINICAL DATA: Flank pain. Frequent UTIs.

EXAM:
CT ABDOMEN AND PELVIS WITHOUT CONTRAST
TECHNIQUE: Multidetector CT imaging of the abdomen and pelvis was performed
following the standard protocol without IV contrast.

[Series 3: renal stone 5.0 · axial · 0.81mm/px · z∈[+640,+1100]mm · 13 of 100 slices shown, 15 images]
[im 4/100  soft-tissue]
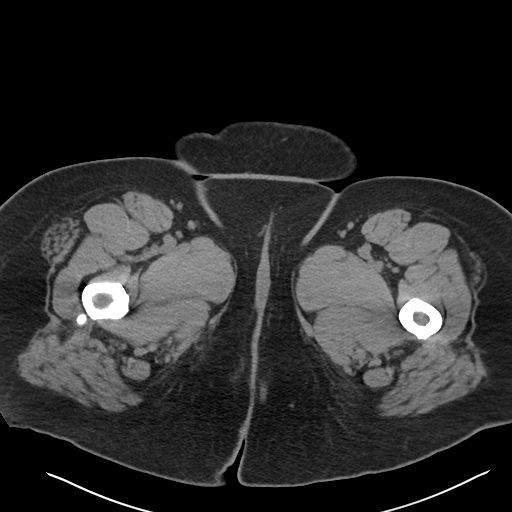
[im 4/100  bone]
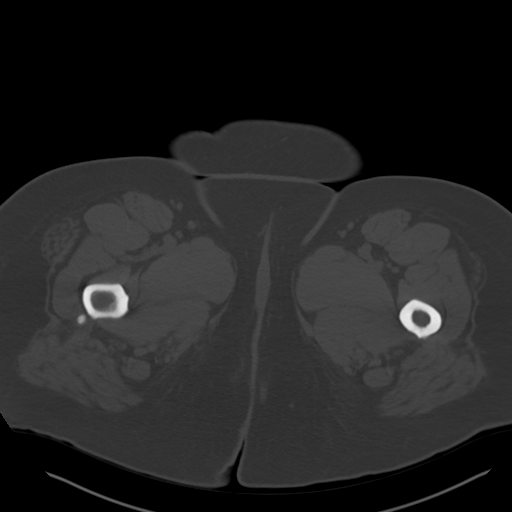
[im 12/100  soft-tissue]
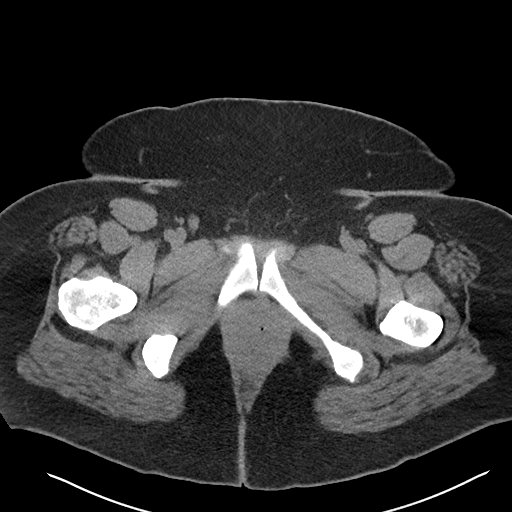
[im 20/100  soft-tissue]
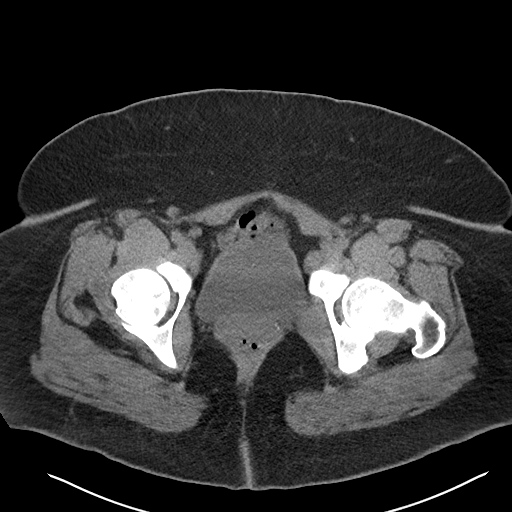
[im 27/100  soft-tissue]
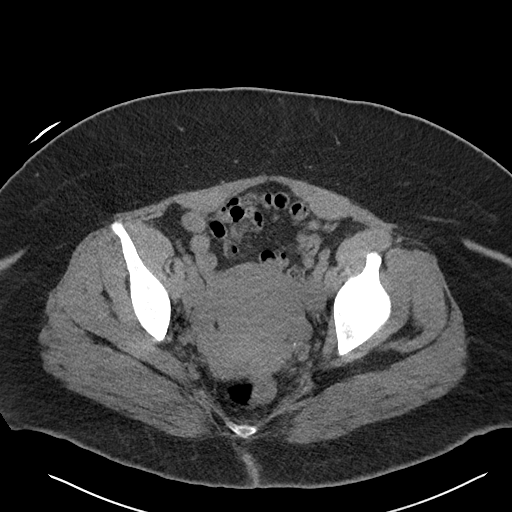
[im 35/100  soft-tissue]
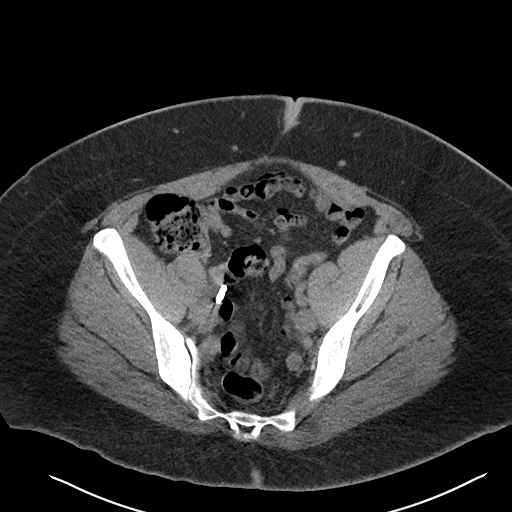
[im 42/100  soft-tissue]
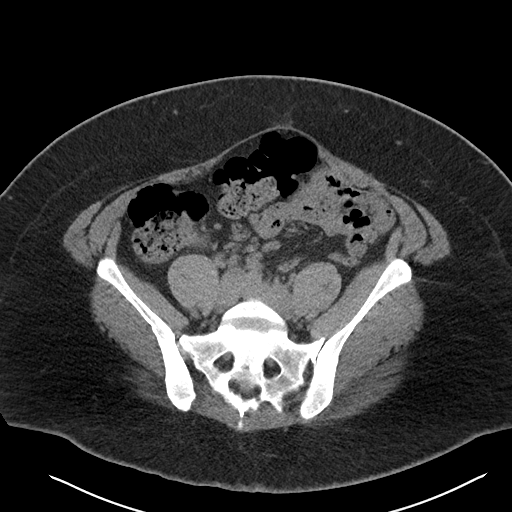
[im 50/100  soft-tissue]
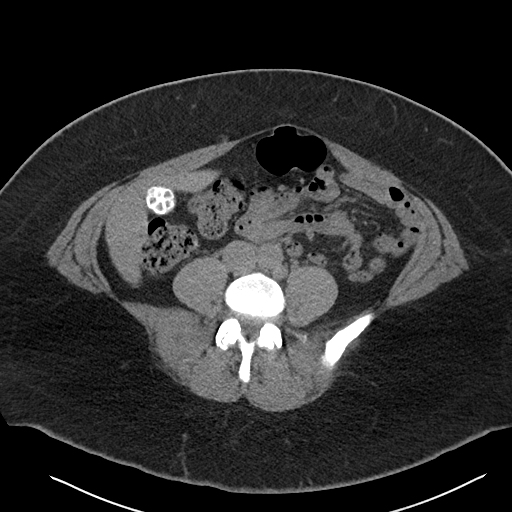
[im 58/100  soft-tissue]
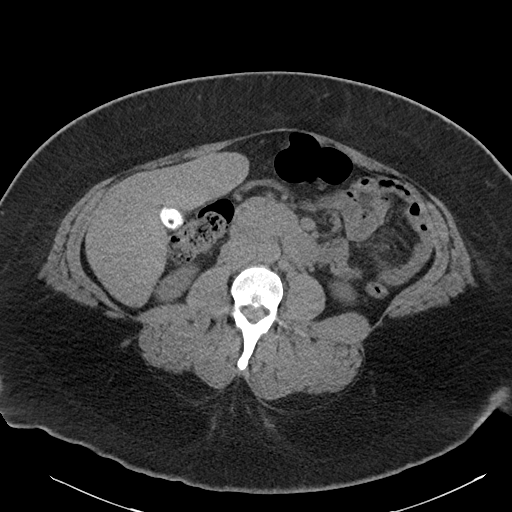
[im 65/100  soft-tissue]
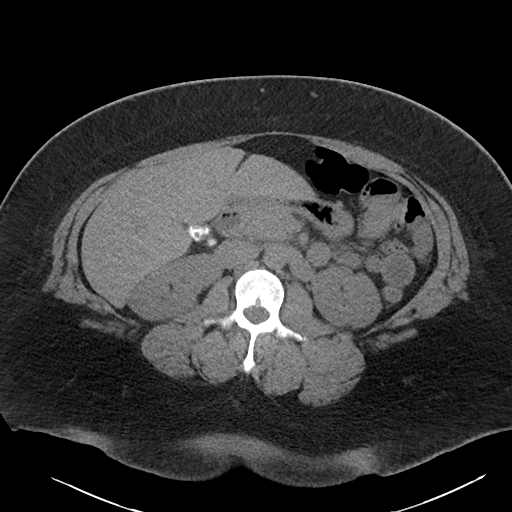
[im 65/100  bone]
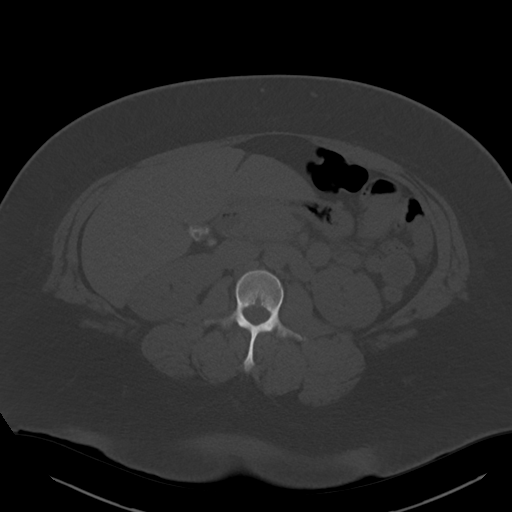
[im 73/100  soft-tissue]
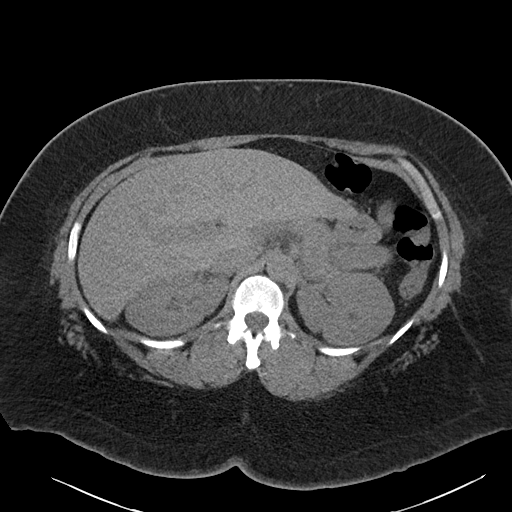
[im 80/100  soft-tissue]
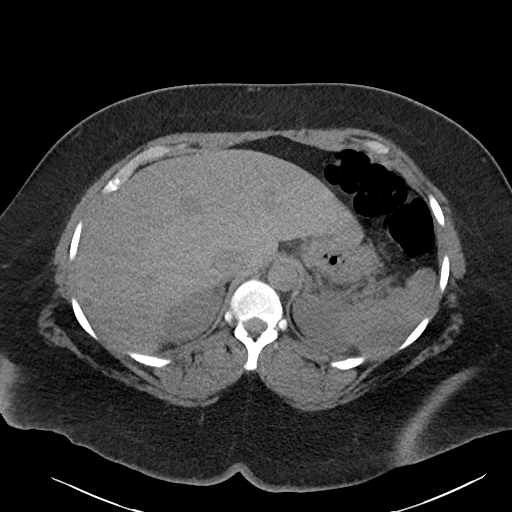
[im 88/100  soft-tissue]
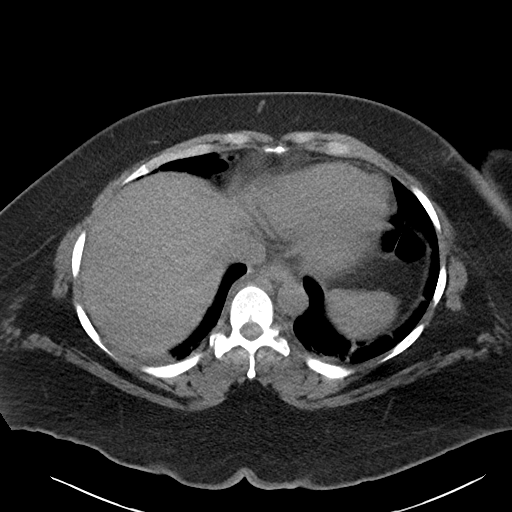
[im 96/100  soft-tissue]
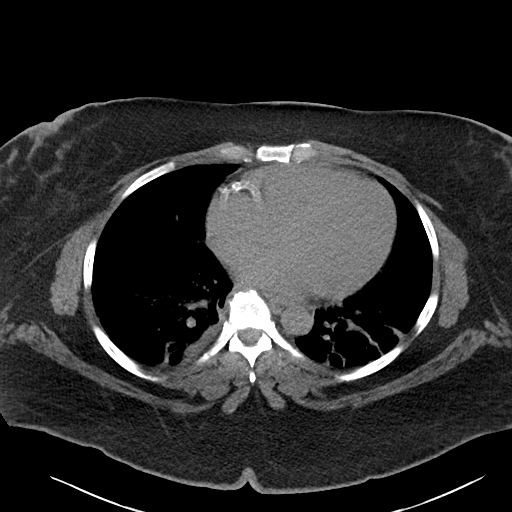

[Series 6: renal stone 3.0 cor · coronal · 0.80mm/px · 3 of 96 slices shown]
[im 32/96  soft-tissue]
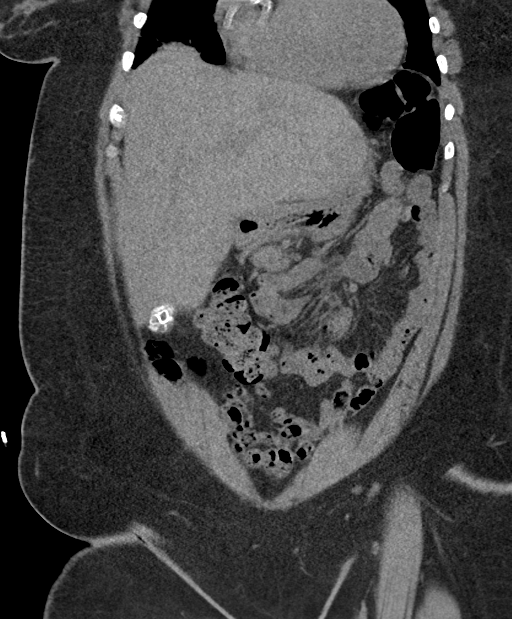
[im 43/96  soft-tissue]
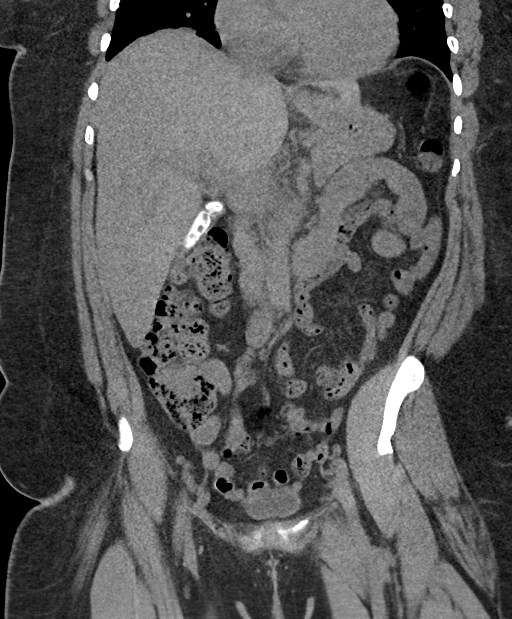
[im 53/96  soft-tissue]
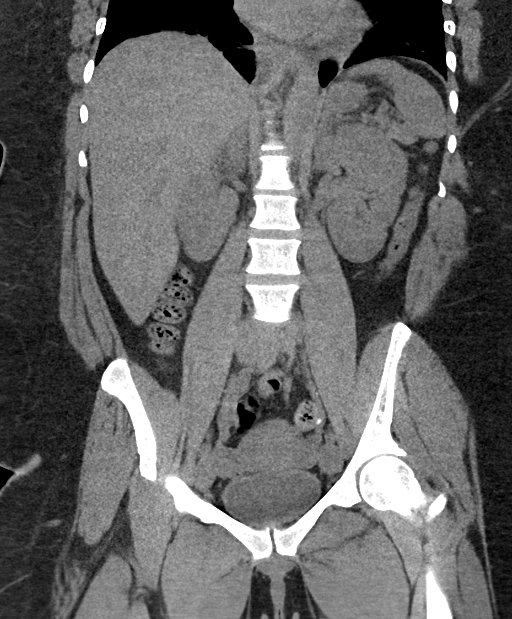

[16 of 46 positions shown; findings below may reference images not displayed]

FINDINGS: LOWER CHEST: There is no basilar pleural or apical pericardial
effusion.

HEPATOBILIARY: The hepatic contours and density are normal. There is
no intra- or extrahepatic biliary dilatation. There is
cholelithiasis without acute inflammation.

PANCREAS: The pancreatic parenchymal contours are normal and there
is no ductal dilatation. There is no peripancreatic fluid
collection.

SPLEEN: Normal.

ADRENALS/URINARY TRACT:

--Adrenal glands: Normal.

--Right kidney/ureter: No hydronephrosis, nephroureterolithiasis,
perinephric stranding or solid renal mass.

--Left kidney/ureter: No hydronephrosis, nephroureterolithiasis,
perinephric stranding or solid renal mass.

--Urinary bladder: Normal for degree of distention

STOMACH/BOWEL:

--Stomach/Duodenum: There is no hiatal hernia or other gastric
abnormality. The duodenal course and caliber are normal.

--Small bowel: No dilatation or inflammation.

--Colon: No focal abnormality.

--Appendix: Normal.

VASCULAR/LYMPHATIC: Normal course and caliber of the major abdominal
vessels. No abdominal or pelvic lymphadenopathy.

REPRODUCTIVE: Normal uterus and ovaries.

MUSCULOSKELETAL. No bony spinal canal stenosis or focal osseous
abnormality.

OTHER: None.
IMPRESSION: 1. No acute abdominal or pelvic abnormality.
2. Cholelithiasis without acute inflammation.

## 2020-04-26 DIAGNOSIS — M79605 Pain in left leg: Secondary | ICD-10-CM | POA: Diagnosis not present

## 2020-04-26 DIAGNOSIS — K219 Gastro-esophageal reflux disease without esophagitis: Secondary | ICD-10-CM | POA: Diagnosis not present

## 2020-04-26 DIAGNOSIS — I252 Old myocardial infarction: Secondary | ICD-10-CM | POA: Diagnosis not present

## 2020-04-26 DIAGNOSIS — Z7901 Long term (current) use of anticoagulants: Secondary | ICD-10-CM | POA: Diagnosis not present

## 2020-04-26 DIAGNOSIS — Z91018 Allergy to other foods: Secondary | ICD-10-CM | POA: Diagnosis not present

## 2020-04-26 DIAGNOSIS — Z79899 Other long term (current) drug therapy: Secondary | ICD-10-CM | POA: Diagnosis not present

## 2020-04-26 DIAGNOSIS — M79602 Pain in left arm: Secondary | ICD-10-CM | POA: Diagnosis not present

## 2020-04-26 DIAGNOSIS — I808 Phlebitis and thrombophlebitis of other sites: Secondary | ICD-10-CM | POA: Diagnosis not present

## 2020-04-26 DIAGNOSIS — F17200 Nicotine dependence, unspecified, uncomplicated: Secondary | ICD-10-CM | POA: Diagnosis not present

## 2020-04-26 DIAGNOSIS — Z959 Presence of cardiac and vascular implant and graft, unspecified: Secondary | ICD-10-CM | POA: Diagnosis not present

## 2020-04-26 DIAGNOSIS — Z881 Allergy status to other antibiotic agents status: Secondary | ICD-10-CM | POA: Diagnosis not present

## 2020-04-26 DIAGNOSIS — Z880 Allergy status to penicillin: Secondary | ICD-10-CM | POA: Diagnosis not present

## 2020-04-27 DIAGNOSIS — M79605 Pain in left leg: Secondary | ICD-10-CM | POA: Diagnosis not present

## 2020-04-29 ENCOUNTER — Ambulatory Visit: Payer: Medicaid Other

## 2020-04-30 ENCOUNTER — Ambulatory Visit: Payer: Federal, State, Local not specified - PPO | Admitting: Family Medicine

## 2020-04-30 ENCOUNTER — Other Ambulatory Visit: Payer: Self-pay

## 2020-04-30 VITALS — BP 134/72 | HR 68 | Wt 375.2 lb

## 2020-04-30 DIAGNOSIS — M7712 Lateral epicondylitis, left elbow: Secondary | ICD-10-CM

## 2020-04-30 DIAGNOSIS — M7710 Lateral epicondylitis, unspecified elbow: Secondary | ICD-10-CM | POA: Insufficient documentation

## 2020-04-30 MED ORDER — DICLOFENAC SODIUM 1 % EX GEL: 4.0000 g | Freq: Four times a day (QID) | CUTANEOUS | 1 refills | Status: DC

## 2020-04-30 NOTE — Progress Notes (Signed)
    SUBJECTIVE:   CHIEF COMPLAINT / HPI: concern for blood clots in upper extremity   Patient was recently evaluated in ED for concern and had upper extremity ultrasound that was negative for DVT, however in Dec had ultrasound that showed superficial thrombus in basilic vein. Patient is on Xarelto for hx of DVT as well as prasugrel for hx of MI.   Patient states that she has been having left arm/elbow pain going on about 4 weeks.  She was initially seen in the ED in December where she had ultrasound that showed superficial thrombosis.  Patient continues to report adherence to her Xarelto regimen.  She also reports MRI fingers on the affected arm.  She reports that it all started after lying on her left arm while sleeping overnight.  She has tried to use a compression sleeve for her left arm but the sleeve was too tight and the second sleeve did not seem to make any difference with her pain.  She was prescribed narcotics but states that she does not like to take pain medication of this level as it makes her feel weird.  She denies any weakness or inability to use her left arm.  She is not aware of any recent trauma other than sleeping on her arm overnight.  Pain has not seemed to change in the last 4 weeks.   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  OBJECTIVE:   BP 134/72   Pulse 68   Wt (!) 375 lb 3.2 oz (170.2 kg)   SpO2 95%   BMI 57.05 kg/m   General: obese female appearing stated age in no acute distress Extremities: Left arm with no erythema, no edema, 5/5 strength, 5/5 grip strength, normal flexion and extension, pulses intact  ASSESSMENT/PLAN:   Lateral epicondylitis Differential includes possible cubital tunnel syndrome given her history of compression on the arm while sleeping, could also consider thrombophlebitis but no skin changes observed and arm does not feel warm to touch  -Prescribed voltaren gel  -Patient to  follow-up in 2 weeks -Complete x-ray of left elbow   Ronnald Ramp, MD [facility]

## 2020-04-30 NOTE — Assessment & Plan Note (Addendum)
Differential includes possible cubital tunnel syndrome given her history of compression on the arm while sleeping, could also consider thrombophlebitis but no skin changes observed and arm does not feel warm to touch  -Prescribed voltaren gel  -Patient to follow-up in 2 weeks -Complete x-ray of left elbow

## 2020-04-30 NOTE — Patient Instructions (Signed)
It was a pleasure to see you today!  Thank you for choosing [facility] for your primary care.  Angelica Brown was seen for left arm pain.   Our plans for today were:  For your arm pain, I prescribed a topical gel for you to apply up to 4 times a day to help with your pain.  I will also order x-ray of your elbow  To keep you healthy, please keep in mind the following health maintenance items that you are due for:   1. Influenza vaccine   You should return to our clinic in 2 weeks for follow-up arm pain.   Best Wishes,   Dr. Neita Garnet    Tennis Elbow  Tennis elbow is irritation and swelling (inflammation) in your outer forearm, near your elbow. Swelling affects the tissues that connect muscle to bone (tendons). Tennis elbow can happen playing any sport or doing any job where you use your elbow too much. It is caused by doing the same motion over and over. What are the causes? This condition is often caused by playing sports or doing work where you need to keep moving your forearm the same way. Sometimes, it may be caused by a sudden injury. What increases the risk? You are more likely to get tennis elbow if you play tennis or another racket sport. You also have a higher risk if you often use your hands for work. This includes:  People who use computers.  Holiday representative workers.  People who work in a factory.  Musicians.  Cooks.  Cashiers. What are the signs or symptoms?  Pain and tenderness in your forearm and the outer part of your elbow. You may have pain all the time or only when you use your arm.  A burning feeling. This starts in your elbow and spreads down your arm.  A weak grip in your hand. How is this treated? Resting and icing your arm is often the first treatment. Your doctor may also recommend:  Medicines to reduce pain and swelling.  An elbow strap.  Physical therapy. This may include massage or exercises or both.  An elbow  brace. If these do not help your symptoms get better, your doctor may recommend surgery. Follow these instructions at home: If you have a brace or strap:  Wear the brace or strap as told by your doctor. Take it off only as told by your doctor.  Check the skin around the brace or strap every day. Tell your doctor if you see problems.  Loosen it if your fingers: ? Tingle. ? Become numb. ? Turn cold and blue.  Keep the brace or strap clean.  If the brace or strap is not waterproof: ? Do not let it get wet. ? Cover it with a watertight covering when you take a bath or a shower. Managing pain, stiffness, and swelling  If told, put ice on the injured area. To do this: ? If you have a removable brace or strap, take it off as told by your doctor. ? Put ice in a plastic bag. ? Place a towel between your skin and the bag. ? Leave the ice on for 20 minutes, 2-3 times a day. ? Take off the ice if your skin turns bright red. This is very important. If you cannot feel pain, heat, or cold, you have a greater risk of damage to the area.  Move your fingers often.   Activity  Rest your elbow and wrist. Avoid activities  that can cause elbow problems as told by your doctor.  Do exercises as told by your doctor.  If you lift an object, lift it with your palm facing up. Lifestyle  If your tennis elbow is caused by sports, check your equipment and make sure that: ? You are using it the right way. ? It fits you well.  If your tennis elbow is caused by work or computer use, take breaks often to stretch your arm. Talk with your manager about how you can make your condition better at work. General instructions  Take over-the-counter and prescription medicines only as told by your doctor.  Do not smoke or use any products that contain nicotine or tobacco. If you need help quitting, ask your doctor.  Keep all follow-up visits. How is this prevented?  Before and after being active: ? Warm up  and stretch before being active. ? Cool down and stretch after being active. ? Give your body time to rest between activities.  While being active: ? Make sure to use equipment that fits you. ? If you play tennis, put power in your stroke with your lower body. Avoid using your arm only.  Maintain physical fitness. This includes: ? Strength. ? Flexibility. ? Endurance.  Do exercises to strengthen the forearm muscles. Contact a doctor if:  Your pain does not get better with treatment.  Your pain gets worse.  You have weakness in your forearm, hand, or fingers.  You cannot feel your forearm, hand, or fingers. Get help right away if:  Your pain is very bad.  You cannot move your wrist. Summary  Tennis elbow is irritation and swelling (inflammation) in your outer forearm, near your elbow.  Tennis elbow is caused by doing the same motion over and over.  Rest your elbow and wrist. Avoid activities as told by your doctor.  If told, put ice on the injured area for 20 minutes, 2-3 times a day. This information is not intended to replace advice given to you by your health care provider. Make sure you discuss any questions you have with your health care provider. Document Revised: 10/16/2019 Document Reviewed: 10/16/2019 Elsevier Patient Education  2021 [employer].

## 2020-05-02 DIAGNOSIS — G4733 Obstructive sleep apnea (adult) (pediatric): Secondary | ICD-10-CM | POA: Diagnosis not present

## 2020-05-06 ENCOUNTER — Telehealth (INDEPENDENT_AMBULATORY_CARE_PROVIDER_SITE_OTHER): Payer: Medicaid Other | Admitting: Family Medicine

## 2020-05-06 NOTE — Progress Notes (Signed)
Attempted to reach patient for virtual visit. Phone not working, says "call cannot be completed as dialed". No charge.  Angelica Dodrill, MD

## 2020-05-11 NOTE — Progress Notes (Incomplete)
Patient Care Team: Latrelle Dodrill, MD as PCP - General (Pediatrics) Lyn Records, MD as PCP - Cardiology (Cardiology)  DIAGNOSIS: No diagnosis found.   CHIEF COMPLIANT: Follow-up of dural venous sinus thrombosis on Xarelto   INTERVAL HISTORY: Angelica Brown is a 45 y.o. 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 She presents to the clinic today for follow-up.   ALLERGIES:  is allergic to ampicillin, penicillins, strawberry extract, cephalexin, and prednisone.  MEDICATIONS:  Current Outpatient Medications  Medication Sig Dispense Refill  . acetaminophen (TYLENOL) 500 MG tablet Take 500-1,000 mg by mouth every 6 (six) hours as needed for mild pain or headache.    . cetirizine (ZYRTEC) 10 MG tablet Take 1 tablet (10 mg total) by mouth daily as needed for allergies. 30 tablet 11  . diclofenac Sodium (VOLTAREN) 1 % GEL Apply 4 g topically 4 (four) times daily. 150 g 1  . meclizine (ANTIVERT) 12.5 MG tablet TAKE 1 TABLET BY MOUTH THREE TIMES DAILY AS NEEDED FOR  DIZZINESS 30 tablet 0  . metoprolol tartrate (LOPRESSOR) 25 MG tablet TAKE 1/2 (ONE-HALF) TABLET BY MOUTH ONCE DAILY WITH BREAKFAST 45 tablet 3  . nitroGLYCERIN (NITROSTAT) 0.4 MG SL tablet Place 1 tablet (0.4 mg total) under the tongue every 5 (five) minutes x 3 doses as needed for chest pain. 25 tablet 12  . omeprazole (PRILOSEC) 40 MG capsule Take 40 mg by mouth 2 (two) times daily as needed (for GERD-like symptoms).     . prasugrel (EFFIENT) 10 MG TABS tablet Take 1 tablet by mouth once daily 90 tablet 3  . rivaroxaban (XARELTO) 20 MG TABS tablet Take 1 tablet (20 mg total) by mouth daily. 90 tablet 3  . rosuvastatin (CRESTOR) 40 MG tablet Take 1 tablet (40 mg total) by mouth daily. 90 tablet 3  . topiramate (TOPAMAX) 50 MG tablet Take 1 tablet by mouth once daily 90 tablet  0   No current facility-administered medications for this visit.    PHYSICAL EXAMINATION: ECOG PERFORMANCE STATUS: {CHL ONC ECOG PS:[id number]}  There were no vitals filed for this visit. There were no vitals filed for this visit.  LABORATORY DATA:  I have reviewed the data as listed CMP Latest Ref Rng & Units 11/28/2019 11/17/2018 05/05/2018  Glucose 65 - 99 mg/dL 87 86 05/07/2018)  BUN 6 - 24 mg/dL 12 9 7   Creatinine 0.57 - 1.00 mg/dL 098(J 1.91  Sodium 134 - 144 mmol/L 140 139 139  Potassium 3.5 - 5.2 mmol/L 4.3 3.5 3.4(L)  Chloride 96 - 106 mmol/L 104 103 107  CO2 20 - 29 mmol/L 24 26 25   Calcium 8.7 - 10.2 mg/dL 9.6 9.3 4.78)  Total Protein 6.5 - 8.1 g/dL - - 6.3(L)  Total Bilirubin 0.3 - 1.2 mg/dL - - 0.9  Alkaline Phos 38 - 126 U/L - - 53  AST 15 - 41 U/L - - 48(H)  ALT 0 - 44 U/L - - 18    Lab Results  Component Value Date   WBC 13.7 (H) 11/28/2019   HGB 13.7 11/28/2019   HCT 42.6 11/28/2019   MCV 87 11/28/2019   PLT 353 11/28/2019   NEUTROABS 9.3 (H) 11/17/2018    ASSESSMENT & PLAN:  No problem-specific Assessment & Plan notes found for this encounter.    No orders of the defined types were placed in this  encounter.  The patient has a good understanding of the overall plan. she agrees with it. she will call with any problems that may develop before the next visit here.  Total time spent: *** mins including face to face time and time spent for planning, charting and coordination of care  Serena Croissant, MD 05/11/2020  I, Kirt Boys Dorshimer, am acting as scribe for Dr. Serena Croissant.  {insert scribe attestation}

## 2020-05-12 ENCOUNTER — Inpatient Hospital Stay: Payer: Medicaid Other | Attending: Hematology and Oncology | Admitting: Hematology and Oncology

## 2020-05-12 NOTE — Assessment & Plan Note (Deleted)
Sudden onset of headaches treated initially for migraines but when she was found to have profoundly enlarged retinal veins, she underwent a CT of the head which revealed dural venous sinus thrombosis.  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

## 2020-05-15 DIAGNOSIS — G4733 Obstructive sleep apnea (adult) (pediatric): Secondary | ICD-10-CM | POA: Diagnosis not present

## 2020-05-16 DIAGNOSIS — G4733 Obstructive sleep apnea (adult) (pediatric): Secondary | ICD-10-CM | POA: Diagnosis not present
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
## 2020-05-27 ENCOUNTER — Encounter: Payer: Self-pay | Admitting: Family Medicine

## 2020-05-27 ENCOUNTER — Ambulatory Visit
Admission: RE | Admit: 2020-05-27 | Discharge: 2020-05-27 | Disposition: A | Discharge status: Home / Self Care | Payer: Medicaid Other | Source: Ambulatory Visit | Attending: Family Medicine | Admitting: Family Medicine

## 2020-05-27 ENCOUNTER — Other Ambulatory Visit: Payer: Self-pay

## 2020-05-27 ENCOUNTER — Ambulatory Visit: Payer: Medicaid Other | Admitting: Family Medicine

## 2020-05-27 DIAGNOSIS — M7712 Lateral epicondylitis, left elbow: Secondary | ICD-10-CM

## 2020-05-27 DIAGNOSIS — F17200 Nicotine dependence, unspecified, uncomplicated: Secondary | ICD-10-CM

## 2020-05-27 DIAGNOSIS — D6861 Antiphospholipid syndrome: Secondary | ICD-10-CM

## 2020-05-27 DIAGNOSIS — I252 Old myocardial infarction: Secondary | ICD-10-CM

## 2020-05-27 DIAGNOSIS — N911 Secondary amenorrhea: Secondary | ICD-10-CM

## 2020-05-27 DIAGNOSIS — R7303 Prediabetes: Secondary | ICD-10-CM | POA: Diagnosis not present

## 2020-05-27 DIAGNOSIS — I10 Essential (primary) hypertension: Secondary | ICD-10-CM | POA: Diagnosis not present

## 2020-05-27 DIAGNOSIS — Z23 Encounter for immunization: Secondary | ICD-10-CM

## 2020-05-27 LAB — POCT GLYCOSYLATED HEMOGLOBIN (HGB A1C): Hemoglobin A1C: 6.1 % — AB (ref 4.0–5.6)

## 2020-05-27 LAB — POCT URINE PREGNANCY: Preg Test, Ur: NEGATIVE

## 2020-05-27 MED ORDER — NITROGLYCERIN 0.4 MG SL SUBL: 0.4000 mg | SUBLINGUAL_TABLET | SUBLINGUAL | 12 refills | Status: DC | PRN

## 2020-05-27 NOTE — Assessment & Plan Note (Signed)
Encouraged weight loss and exercise as she is able Unfortunately I doubt she will be a candidate for weight loss surgery due to her cardiac and clotting history, but she can certainly pursue this in [place] and see whether she is able to be cleared for surgery.

## 2020-05-27 NOTE — Patient Instructions (Addendum)
Flu and COVID booster today  Pick up voltaren and get xray Let me know if elbow worsening, can refer to orthopedist  Refilled nitroglycerin so you have it  Schedule follow up with cardiologist  [facility] [address] [place], [place] [address] [phone]) Main [phone] Direct  If you are in immediate danger, please call 911, or [facility] 24 hour Crisis Hotline at [phone].  Blood pressure check and pneumonia shot on Friday  Be well, Dr. Pollie Meyer

## 2020-05-27 NOTE — Assessment & Plan Note (Signed)
Wants to quit, but will wait til she is in [place] to really get going with this.

## 2020-05-27 NOTE — Assessment & Plan Note (Signed)
Encouraged follow up with cardiologist. Refill nitroglycerin so she has it on hand

## 2020-05-27 NOTE — Assessment & Plan Note (Signed)
Update A1c today 

## 2020-05-27 NOTE — Assessment & Plan Note (Signed)
Stable on xarelto. Continue.

## 2020-05-27 NOTE — Assessment & Plan Note (Signed)
Tender over lateral epicondyle of L arm. No warmth or palpable cords to raise concern about DVT. Encouraged getting xray as previously ordered, and picking up voltaren gel If not improved would refer to ortho

## 2020-05-27 NOTE — Assessment & Plan Note (Signed)
Blood pressure elevated today, has not yet taken metoprolol today which normally takes in AM RN blood pressure check scheduled for Fri AM when she has taken her medication

## 2020-05-28 LAB — CMP14+EGFR
ALT: 16 IU/L (ref 0–32)
AST: 13 IU/L (ref 0–40)
Albumin/Globulin Ratio: 1.4 (ref 1.2–2.2)
Albumin: 4.1 g/dL (ref 3.8–4.8)
Alkaline Phosphatase: 87 IU/L (ref 44–121)
BUN/Creatinine Ratio: 8 — ABNORMAL LOW (ref 9–23)
BUN: 7 mg/dL (ref 6–24)
Bilirubin Total: 0.4 mg/dL (ref 0.0–1.2)
CO2: 24 mmol/L (ref 20–29)
Calcium: 9.4 mg/dL (ref 8.7–10.2)
Chloride: 103 mmol/L (ref 96–106)
Creatinine, Ser: 0.83 mg/dL (ref 0.57–1.00)
GFR calc Af Amer: 100 mL/min/{1.73_m2} (ref >59)
GFR calc non Af Amer: 87 mL/min/{1.73_m2} (ref >59)
Globulin, Total: 3 g/dL (ref 1.5–4.5)
Glucose: 89 mg/dL (ref 65–99)
Potassium: 3.9 mmol/L (ref 3.5–5.2)
Sodium: 141 mmol/L (ref 134–144)
Total Protein: 7.1 g/dL (ref 6.0–8.5)

## 2020-05-28 LAB — LIPID PANEL
Chol/HDL Ratio: 3.8 ratio (ref 0.0–4.4)
Cholesterol, Total: 173 mg/dL (ref 100–199)
HDL: 45 mg/dL (ref >39)
LDL Chol Calc (NIH): 112 mg/dL — ABNORMAL HIGH (ref 0–99)
Triglycerides: 86 mg/dL (ref 0–149)
VLDL Cholesterol Cal: 16 mg/dL (ref 5–40)

## 2020-05-30 ENCOUNTER — Telehealth: Payer: Self-pay | Admitting: Family Medicine

## 2020-05-30 ENCOUNTER — Other Ambulatory Visit: Payer: Self-pay

## 2020-05-30 ENCOUNTER — Ambulatory Visit (INDEPENDENT_AMBULATORY_CARE_PROVIDER_SITE_OTHER): Payer: Federal, State, Local not specified - PPO

## 2020-05-30 VITALS — BP 126/70

## 2020-05-30 DIAGNOSIS — Z23 Encounter for immunization: Secondary | ICD-10-CM

## 2020-05-30 DIAGNOSIS — I10 Essential (primary) hypertension: Secondary | ICD-10-CM

## 2020-05-30 NOTE — Progress Notes (Signed)
Patient here today for BP check.      Last BP was on 05/27/2020 and was 142/80.  BP today is 126/70.   Checked BP in left arm with large cuff.    Symptoms present: none.   Patient last took BP med at 6:30am today. Routed note to PCP.      Pneumovax was given in RD without complication.

## 2020-05-30 NOTE — Telephone Encounter (Signed)
Patient just called and said she left without talking to doctor about results, she wants to know if doctor can call with results. Please advise. Thanks!

## 2020-05-30 NOTE — Telephone Encounter (Signed)
Called patient and reviewed results Main remarkable item is A1c is 6.1, advised weight loss and avoiding glycemic foods will help prevent progression to diabetes Also reviewed xray results, showed some swelling but no fracture. She plans to start voltaren gel today She will let me know if she wants a referral to orthopedics.

## 2020-06-03 ENCOUNTER — Emergency Department (HOSPITAL_COMMUNITY)
Admission: EM | Admit: 2020-06-03 | Discharge: 2020-06-04 | Disposition: A | Discharge status: Home / Self Care | Payer: Federal, State, Local not specified - PPO | Attending: Emergency Medicine | Admitting: Emergency Medicine

## 2020-06-03 ENCOUNTER — Other Ambulatory Visit: Payer: Self-pay

## 2020-06-03 ENCOUNTER — Emergency Department (HOSPITAL_COMMUNITY): Payer: Federal, State, Local not specified - PPO

## 2020-06-03 DIAGNOSIS — F17210 Nicotine dependence, cigarettes, uncomplicated: Secondary | ICD-10-CM | POA: Insufficient documentation

## 2020-06-03 DIAGNOSIS — M7061 Trochanteric bursitis, right hip: Secondary | ICD-10-CM | POA: Insufficient documentation

## 2020-06-03 DIAGNOSIS — Z79899 Other long term (current) drug therapy: Secondary | ICD-10-CM | POA: Diagnosis not present

## 2020-06-03 DIAGNOSIS — I110 Hypertensive heart disease with heart failure: Secondary | ICD-10-CM | POA: Insufficient documentation

## 2020-06-03 DIAGNOSIS — M25551 Pain in right hip: Secondary | ICD-10-CM

## 2020-06-03 DIAGNOSIS — Z7901 Long term (current) use of anticoagulants: Secondary | ICD-10-CM | POA: Diagnosis not present

## 2020-06-03 DIAGNOSIS — Y939 Activity, unspecified: Secondary | ICD-10-CM | POA: Diagnosis not present

## 2020-06-03 DIAGNOSIS — I2510 Atherosclerotic heart disease of native coronary artery without angina pectoris: Secondary | ICD-10-CM | POA: Diagnosis not present

## 2020-06-03 DIAGNOSIS — I5022 Chronic systolic (congestive) heart failure: Secondary | ICD-10-CM | POA: Diagnosis not present

## 2020-06-03 MED ORDER — HYDROMORPHONE HCL 1 MG/ML IJ SOLN
1.0000 mg | Freq: Once | INTRAMUSCULAR | Status: AC
Start: 2020-06-03 — End: 2020-06-03
  Administered 2020-06-03: 1 mg via INTRAVENOUS
  Filled 2020-06-03: qty 1

## 2020-06-03 MED ORDER — DEXAMETHASONE SODIUM PHOSPHATE 10 MG/ML IJ SOLN
10.0000 mg | Freq: Once | INTRAMUSCULAR | Status: AC
  Administered 2020-06-03: 10 mg via INTRAVENOUS
  Filled 2020-06-03: qty 1

## 2020-06-03 MED ORDER — ACETAMINOPHEN 500 MG PO TABS
1000.0000 mg | ORAL_TABLET | Freq: Once | ORAL | Status: AC
  Administered 2020-06-03: 1000 mg via ORAL
  Filled 2020-06-03: qty 2

## 2020-06-03 MED ORDER — HYDROCODONE-ACETAMINOPHEN 5-325 MG PO TABS: 1.0000 | ORAL_TABLET | Freq: Four times a day (QID) | ORAL | 0 refills | Status: DC | PRN

## 2020-06-03 MED ORDER — ONDANSETRON 4 MG PO TBDP
4.0000 mg | ORAL_TABLET | Freq: Once | ORAL | Status: AC
  Administered 2020-06-03: 4 mg via ORAL
  Filled 2020-06-03: qty 1

## 2020-06-03 MED ORDER — HYDROMORPHONE HCL 1 MG/ML IJ SOLN
1.0000 mg | Freq: Once | INTRAMUSCULAR | Status: AC
  Administered 2020-06-03: 1 mg via INTRAMUSCULAR
  Filled 2020-06-03: qty 1

## 2020-06-03 NOTE — ED Notes (Signed)
Patient transported to X-ray 

## 2020-06-03 NOTE — ED Triage Notes (Signed)
Pt here for eval of itching/burning pain to R leg x 4 days. Pain originates just under buttocks on posterior thigh and wraps around to just above her knee cap. Pt concerned she has another DVT, has hx of same. Pt currently taking Xarelto and Effient. Denies swelling or redness to leg.

## 2020-06-03 NOTE — ED Notes (Signed)
Pt refused vital recheck, and asked to speak with the charge nurse.  Charge nurse was notified through messages of pt's request.

## 2020-06-03 NOTE — ED Provider Notes (Signed)
[facility] EMERGENCY DEPARTMENT Provider Note   CSN: [id number] Arrival date & time: 06/03/20  1644     History Chief Complaint  Patient presents with  . Leg Pain    Angelica Brown is a 45 y.o. female.  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 Patient reports this started approximately 4 days ago. She noticed a twinge 4 days ago that has gradually worsened and now is severe. The only thing that improves the pain some is if she stands. It is significantly worse if she is trying to lay down and lift her leg. She has no medial thigh pain or pain below the knee. She denies any injuries. Sometimes the pain will shoot down to her toes but she has no localized pain in the foot. She denies any fevers or similar symptoms as this in the past. She does sleep on her side but cannot recall any particular injury. She is currently waiting to get a root canal and has been given Tylenol 3 for her tooth. She has been taking that for her leg as well as Tylenol and ibuprofen even though she is not supposed to take that without significant improvement. She denies any injury to the leg or hard firm areas, bruising or rash.  The history is provided by the patient.  Leg Pain Location:  Hip, buttock and leg Time since incident:  4 days Injury: no   Hip location:  R hip Buttock location:  R buttock Leg location:  R leg Pain details:    Quality:  Aching, shooting and throbbing   Radiates to:  R leg   Severity:  Severe   Onset quality:  Gradual   Duration:  3 days   Timing:  Constant   Progression:  Worsening Chronicity:  New Relieved by:  Nothing Exacerbated by: lying down and flexing the leg. Ineffective treatments:  Acetaminophen and NSAIDs (tylenol #3) Associated symptoms: decreased ROM   Associated  symptoms: no back pain, no fever, no numbness, no swelling and no tingling   Associated symptoms comment:  No goin or medial thigh tenderness.  No leg swelling.  Occasionally when she is walking her leg will buckle      Past Medical History:  Diagnosis Date  . ACS (acute coronary syndrome) (HCC) 05/04/2018  . CHOLELITHIASIS   . Chronic systolic CHF (congestive heart failure) (HCC) 06/13/2016   Ischemic CM (inf MI 05/2018) >> Echo 05/04/2018 - Mod LVH, EF 40-45, inf-lat HK, PASP 47 (mild pul HTN)  . Coronary artery disease   . Dermatophytosis of nail   . 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 GERD (gastroesophageal reflux disease)   . Gestational diabetes    "w/all 4 pregnancies"  . Heart murmur   . Hypertension   . Metrorrhagia   . Migraine    "at least twice/month" (05/28/2016)  . Nystagmus 06/30/2016  . OBESITY, MORBID   . OBESITY, NOS   . Pneumonia 11/2014  . POSTURAL LIGHTHEADEDNESS   . Sickle cell trait (HCC)   . STEMI (ST elevation myocardial infarction) (HCC) 11/09/2017   in [place], [place] - see records in Care Everywhere  . SYNCOPE   . Throat pain   . TOBACCO DEPENDENCE   . URI   . Urinary  frequency     Patient Active Problem List   Diagnosis Date Noted  . Hypertension 05/27/2020  . Lateral epicondylitis 04/30/2020  . Prediabetes 11/27/2019  . Otitis externa 11/07/2019  . Pelvic pain 08/06/2019  . OSA (obstructive sleep apnea) 08/06/2019  . Left leg pain 03/22/2019  . Acute left-sided low back pain without sciatica 11/21/2018  . Left flank pain 11/14/2018  . Hx of Inf MI (7/19 Tx with BMS to RCA; stent thrombosis 9/19 tx with DES to RCA; stent thrombosis 04/2018 tx with thrombectomy) 05/04/2018  . Bradycardia 04/20/2018  . History of MI (myocardial infarction) 04/20/2018  . 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 Seborrheic dermatitis 10/27/2016  . Nystagmus  06/30/2016  . Chronic systolic CHF (congestive heart failure) (HCC) 06/13/2016  . Acute stress reaction 06/09/2016  . Mastoiditis of right side 05/29/2016  . 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 Papilledema of both eyes   . Bacterial meningitis   . Headache 05/26/2016  . Onychomycosis 04/16/2013  . Family history of breast cancer 01/17/2013  . Sickle cell trait (HCC)   . Dizziness 08/29/2008  . OBESITY, MORBID 07/10/2008  . SYNCOPE 10/24/2006  . TOBACCO DEPENDENCE 06/16/2006  . METRORRHAGIA 06/16/2006    Past Surgical History:  Procedure Laterality Date  . BARTHOLIN GLAND CYST EXCISION Bilateral 2004  . CORONARY THROMBECTOMY N/A 05/04/2018   Procedure: Coronary Thrombectomy;  Surgeon: Lyn Records, MD;  Location: [facility] INVASIVE CV LAB;  Service: Cardiovascular;  Laterality: N/A;  . EXCISIONAL HEMORRHOIDECTOMY  2006   "removed a blood clot from one"  . LEFT HEART CATH AND CORONARY ANGIOGRAPHY N/A 05/04/2018   Procedure: LEFT HEART CATH AND CORONARY ANGIOGRAPHY;  Surgeon: Lyn Records, MD;  Location: [facility] INVASIVE CV LAB;  Service: Cardiovascular;  Laterality: N/A;  . TUBAL LIGATION  2007  . WISDOM TOOTH EXTRACTION  1998     OB History    Gravida  5   Para  5   Term  4   Preterm  1   AB      Living  4     SAB      IAB      Ectopic      Multiple  1   Live Births              Family History  Problem Relation Age of Onset  . Breast cancer Mother   . Cancer Father        MELANOMA  . Diabetes Father   . Diabetes Paternal Grandmother   . Diabetes Paternal Grandfather   . Breast cancer Maternal Aunt   . Breast cancer Paternal Aunt     Social History   Tobacco Use  . Smoking status: Current Every Day Smoker    Packs/day: 0.15    Years: 18.00    Pack years: 2.70    Types: Cigarettes  . Smokeless tobacco: Never Used  Vaping Use  . Vaping Use: Never used  Substance Use Topics  . Alcohol use: No  . Drug use: No    Home Medications Prior  to Admission medications   Medication Sig Start Date End Date Taking? Authorizing Provider  acetaminophen (TYLENOL) 500 MG tablet Take 500-1,000 mg by mouth every 6 (six) hours as needed for mild pain or headache.    [provider]  cetirizine (ZYRTEC) 10 MG tablet Take 1 tablet (10 mg total) by mouth daily as needed for allergies. 10/29/19   Meccariello, Solmon Ice, DO  diclofenac Sodium (VOLTAREN) 1 % GEL Apply 4 g topically 4 (four) times daily. 04/30/20   Simmons-Robinson, Tawanna Cooler, MD  metoprolol tartrate (LOPRESSOR) 25 MG tablet TAKE 1/2 (ONE-HALF) TABLET BY MOUTH ONCE DAILY WITH BREAKFAST 01/18/20   Latrelle Dodrill, MD  nitroGLYCERIN (NITROSTAT) 0.4 MG SL tablet Place 1 tablet (0.4 mg total) under the tongue every 5 (five) minutes x 3 doses as needed for chest pain. 05/27/20   Latrelle Dodrill, MD  omeprazole (PRILOSEC) 40 MG capsule Take 40 mg by mouth 2 (two) times daily as needed (for GERD-like symptoms).     [provider]  prasugrel (EFFIENT) 10 MG TABS tablet Take 1 tablet by mouth once daily 01/16/20   Latrelle Dodrill, MD  rivaroxaban (XARELTO) 20 MG TABS tablet Take 1 tablet (20 mg total) by mouth daily. 05/11/19   Serena Croissant, MD  rosuvastatin (CRESTOR) 40 MG tablet Take 1 tablet (40 mg total) by mouth daily. 07/31/19   Latrelle Dodrill, MD  topiramate (TOPAMAX) 50 MG tablet Take 1 tablet by mouth once daily 01/16/20   Latrelle Dodrill, MD    Allergies    Ampicillin, Penicillins, Strawberry extract, Cephalexin, and Prednisone  Review of Systems   Review of Systems  Constitutional: Negative for fever.  Musculoskeletal: Negative for back pain.  All other systems reviewed and are negative.   Physical Exam Updated Vital Signs BP 110/60   Pulse 67   Temp 98.2 F (36.8 C) (Oral)   Resp 11   SpO2 100%   Physical Exam Vitals and nursing note reviewed.  Constitutional:      General: She is not in acute distress.    Appearance: Normal  appearance.     Comments: Appears uncomfortable  HENT:     Head: Normocephalic.  Eyes:     Pupils: Pupils are equal, round, and reactive to light.  Cardiovascular:     Rate and Rhythm: Normal rate.     Pulses: Normal pulses.  Pulmonary:     Effort: Pulmonary effort is normal.  Musculoskeletal:        General: Tenderness present. No swelling.     Right hip: Tenderness and bony tenderness present. Decreased range of motion.     Left hip: Normal.     Right knee: Normal.     Right lower leg: No edema.     Left lower leg: No edema.       Legs:     Comments: No areas of erythema, warmth, tender engorged varicose veins. No hematomas or ecchymosis present. No vesicular lesions. No flank or back pain.  Neurological:     General: No focal deficit present.     Mental Status: She is alert and oriented to person, place, and time. Mental status is at baseline.     Sensory: No sensory deficit.     Motor: No weakness.  Psychiatric:        Mood and Affect: Mood normal.        Behavior: Behavior normal.     ED Results / Procedures / Treatments   Labs (all labs ordered are listed, but only abnormal results are displayed) Labs Reviewed - No data to display  EKG None  Radiology DG Hip Unilat W or Wo Pelvis 2-3 Views Right  Result Date: 06/03/2020 CLINICAL DATA:  Right hip pain EXAM: DG HIP (WITH OR WITHOUT PELVIS) 2-3V RIGHT COMPARISON:  11/17/2018 FINDINGS: Frontal view of the pelvis as well as frontal and frogleg lateral views of  the right hip are obtained. No acute displaced fracture, subluxation, or dislocation within either hip. Mild symmetrical bilateral hip osteoarthritis. Stable bone islands within the proximal right femur. Sacroiliac joints are normal. IMPRESSION: 1. Mild symmetrical bilateral hip osteoarthritis. No acute fractures. Electronically Signed   By: Sharlet Salina M.D.   On: 06/03/2020 23:09    Procedures Procedures   Medications Ordered in ED Medications   HYDROmorphone (DILAUDID) injection 1 mg (has no administration in time range)  ondansetron (ZOFRAN-ODT) disintegrating tablet 4 mg (has no administration in time range)  acetaminophen (TYLENOL) tablet 1,000 mg (has no administration in time range)    ED Course  I have reviewed the triage vital signs and the nursing notes.  Pertinent labs & imaging results that were available during my care of the patient were reviewed by me and considered in my medical decision making (see chart for details).    MDM Rules/Calculators/A&P                          Patient presenting with multiple medical problems here complaining of right hip pain that is shooting down the lateral portion of her leg. Low suspicion that this is a clot today as patient has no medial thigh pain, posterior knee or calf pain. She has no swelling and she is still taking her Xarelto and Effient. She has had no recent trauma and has no evidence of shingles or hematomas. No particular reproduction of pain with palpation along the lateral thigh but significant pain when attempting to flex the hip both actively and passively. She also has pain with lateral compression of the hip but not pain with axial loading. She reports the pain is actually better with standing and worse with lying down. She has no back pain associated with this and lower suspicion for lumbar radiculopathy. She denies any urinary or bowel complaints. Concern for possible bursitis versus muscular or other hip pathology. No erythema warmth or notable swelling to the hip. Bedside ultrasound with compressible femoral and popliteal veins and low suspicion at this time for DVT. Patient given pain control. Plain films show mild bilateral symmetric osteoarthritis but no other acute findings.  This could also be bursistis.  No signs of septic hip.  On re-evaluation pt had modest improvement with pain meds and will give 1 more round of meds.  Plan will be for d/c with pain control and  f/u with PCP and ortho.  Due to pt getting gi upset with prednisone she was given a 1 time dose of dexamethasone here.  MDM Number of Diagnoses or Management Options   Amount and/or Complexity of Data Reviewed Tests in the radiology section of CPT: ordered and reviewed Independent visualization of images, tracings, or specimens: yes    Final Clinical Impression(s) / ED Diagnoses Final diagnoses:  Acute hip pain, right  Trochanteric bursitis of right hip    Rx / DC Orders ED Discharge Orders         Ordered    HYDROcodone-acetaminophen (NORCO/VICODIN) 5-325 MG tablet  Every 6 hours PRN        06/03/20 2353           Gwyneth Sprout, MD 06/04/20 0004

## 2020-06-04 ENCOUNTER — Telehealth: Payer: Self-pay | Admitting: Interventional Cardiology

## 2020-06-04 NOTE — Telephone Encounter (Signed)
Patient stated that she is leaving the [place] for 10[place]12 months and really need to see Dr. Katrinka Blazing. Told patient Dr. Katrinka Blazing doesn't have and availability between now and 06/18/20. Wanted to talk with Dr. Katrinka Blazing nurse regarding appt. Please call back

## 2020-06-04 NOTE — Telephone Encounter (Signed)
Called the patient to inform her that Dr. Michaelle Copas RN was out of office today. Offered patient next available OV but patient states she can not wait that long. She is in need of appointment immediately, requesting to speak with Dr. Michaelle Copas RN.   I let patient know that Dr. Michaelle Copas nurse would return her call at the earliest conveince.

## 2020-06-04 NOTE — Discharge Instructions (Addendum)
Concern that you may have inflammation of the bursa of your hip today or inflammation in the hip joint.  No signs of this being a blood clot or infection.  You can continue to use the voltaren gel but avoid oral ibuprofen products.  You can also use heat if helpful and avoid laying on the right side.  Use the pain medication as needed.  If you develop leg swelling, redness, fever or abdominal or back pain return to the ER or your doctor for evaluation.  You were given a steroid while you were here that should last for several days.

## 2020-06-04 NOTE — ED Notes (Addendum)
Pt reports pain relief, per MD direction will assess in 30 minutes and d/c.

## 2020-06-05 NOTE — Telephone Encounter (Signed)
Spoke with pt and scheduled her to be seen 2/22.  Pt appreciative for call.

## 2020-06-06 ENCOUNTER — Encounter: Payer: Self-pay | Admitting: Family Medicine

## 2020-06-09 NOTE — Progress Notes (Signed)
Cardiology Office Note:    Date:  06/10/2020   ID:  [name], DOB June 04, 1975, MRN [id number]  PCP:  Latrelle Dodrill, MD  Cardiologist:  Lesleigh Noe, MD   Referring MD: Latrelle Dodrill, MD   Chief Complaint  Patient presents with  . Coronary Artery Disease  . Hyperlipidemia  . Hypertension    History of Present Illness:    Angelica Brown is a 45 y.o. female with a hx of CAD with prior stenting of the RCA in July 2019 (BMS) and September 2019 (stent thrombosis possibly plavix non responder- re-stented with DES), anti-phospholipid antibody syndrome, lupus anti-coagulant, secondary thrombophilia, DVT, obesity, HTN, Brilinta intolerant, ultimately Effient/Xarelto, and acute IMI 05/04/2018 after missed doses of Effient and Xarelto due to cost--> recurrent stent thrombosis treated with angiojet and re-institution of Effient/Xarelto.  Recent obstructive sleep apnea diagnosis requiring BiPAP.  [name] is going to be transferred to [facility].  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  She previously worked with the [employer].  She has not had anginal symptoms.  She does complain of exertional dyspnea and feels it is related to significant weight gain during the pandemic.  Since her last visit with cardiology she feels she has gained 70 pounds.  She has not needed to use nitroglycerin.  She has been compliant with her medications.  She continues to smoke 1/2 pack of cigarettes per day.  She frequently falls asleep at night without CPAP.  Past Medical History:  Diagnosis Date  . ACS (acute coronary syndrome) (HCC) 05/04/2018  . CHOLELITHIASIS   . Chronic systolic CHF (congestive heart failure) (HCC) 06/13/2016   Ischemic CM (inf MI 05/2018) >> Echo 05/04/2018 - Mod LVH, EF 40-45, inf-lat HK, PASP 47 (mild pul HTN)  . Coronary artery disease   . Dermatophytosis of nail   . 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 GERD (gastroesophageal reflux disease)   . Gestational diabetes    "w/all 4 pregnancies"  . Heart murmur   . Hypertension   . Metrorrhagia   . Migraine    "at least twice/month" (05/28/2016)  . Nystagmus 06/30/2016  . OBESITY, MORBID   . OBESITY, NOS   . Pneumonia 11/2014  . POSTURAL LIGHTHEADEDNESS   . Sickle cell trait (HCC)   . STEMI (ST elevation myocardial infarction) (HCC) 11/09/2017   in [place], [place] - see records in Care Everywhere  . SYNCOPE   . Throat pain   . TOBACCO DEPENDENCE   . URI   . Urinary frequency     Past Surgical History:  Procedure Laterality Date  . BARTHOLIN GLAND CYST EXCISION Bilateral 2004  . CORONARY THROMBECTOMY N/A 05/04/2018   Procedure: Coronary Thrombectomy;  Surgeon: Lyn Records, MD;  Location: [facility] INVASIVE CV LAB;  Service: Cardiovascular;  Laterality: N/A;  . EXCISIONAL HEMORRHOIDECTOMY  2006   "removed a blood clot from one"  . LEFT HEART CATH AND CORONARY ANGIOGRAPHY N/A 05/04/2018   Procedure: LEFT HEART CATH AND CORONARY ANGIOGRAPHY;  Surgeon: Lyn Records, MD;  Location: [facility] INVASIVE CV LAB;  Service: Cardiovascular;  Laterality: N/A;  . TUBAL LIGATION  2007  . WISDOM TOOTH EXTRACTION  1998    Current Medications: Current Meds  Medication Sig  . acetaminophen (TYLENOL) 500 MG tablet Take 500-1,000 mg by mouth every 6 (six) hours as needed for mild  pain or headache.  . cetirizine (ZYRTEC) 10 MG tablet Take 1 tablet (10 mg total) by mouth daily as needed for allergies.  Marland Kitchen diclofenac Sodium (VOLTAREN) 1 % GEL Apply 4 g topically 4 (four) times daily.  Marland Kitchen HYDROcodone-acetaminophen (NORCO/VICODIN) 5-325 MG tablet Take 1 tablet by mouth every 6 (six) hours as needed for severe pain.  . metoprolol tartrate (LOPRESSOR) 25 MG tablet TAKE 1/2 (ONE-HALF) TABLET BY MOUTH ONCE DAILY WITH BREAKFAST  . nitroGLYCERIN (NITROSTAT) 0.4 MG SL tablet Place 1 tablet (0.4  mg total) under the tongue every 5 (five) minutes x 3 doses as needed for chest pain.  Marland Kitchen omeprazole (PRILOSEC) 40 MG capsule Take 40 mg by mouth 2 (two) times daily as needed (for GERD-like symptoms).   . prasugrel (EFFIENT) 10 MG TABS tablet Take 1 tablet by mouth once daily  . rivaroxaban (XARELTO) 20 MG TABS tablet Take 1 tablet (20 mg total) by mouth daily.  . rosuvastatin (CRESTOR) 40 MG tablet Take 1 tablet (40 mg total) by mouth daily.  Marland Kitchen topiramate (TOPAMAX) 50 MG tablet Take 1 tablet by mouth once daily     Allergies:   Ampicillin, Penicillins, Strawberry extract, Cephalexin, and Prednisone   Social History   Socioeconomic History  . Marital status: Married    Spouse name: Not on file  . Number of children: Not on file  . Years of education: Not on file  . Highest education level: Not on file  Occupational History  . Not on file  Tobacco Use  . Smoking status: Current Every Day Smoker    Packs/day: 0.15    Years: 18.00    Pack years: 2.70    Types: Cigarettes  . Smokeless tobacco: Never Used  Vaping Use  . Vaping Use: Never used  Substance and Sexual Activity  . Alcohol use: No  . Drug use: No  . Sexual activity: Yes    Birth control/protection: Surgical  Other Topics Concern  . Not on file  Social History Narrative   Angelica Brown - 2003    Angelica Brown - 2008   Live with Angelica Brown    Social Determinants of Health   Financial Resource Strain: Not on file  Food Insecurity: Not on file  Transportation Needs: Not on file  Physical Activity: Not on file  Stress: Not on file  Social Connections: Not on file     Family History: The patient's family history includes Breast cancer in her maternal aunt, mother, and paternal aunt; Cancer in her father; Diabetes in her father, paternal grandfather, and paternal grandmother.  ROS:   Please see the history of present illness.    She walks three times per day with her dog.  There is no case to the walk.  She does get  shortness of breath with physical activity.  She denies orthopnea.  She does have lower extremity swelling.  She does not limit salt in her diet.  She does not limit carbohydrates in her diet.  She eats a lot of fast foods.  She is not diet conscious all other systems reviewed and are negative.  EKGs/Labs/Other Studies Reviewed:    The following studies were reviewed today: No new imaging data  EKG:  EKG normal sinus rhythm, left and right atrial abnormality, otherwise normal appearance other than inferior Q waves with inferior T wave abnormality consistent with prior Q wave inferior infarction.  When compared to prior tracings most recently from Aug 28, 2019, no significant change has occurred.  Recent Labs: 11/28/2019: Hemoglobin 13.7; Platelets 353 05/27/2020: ALT 16; BUN 7; Creatinine, Ser 0.83; Potassium 3.9; Sodium 141  Recent Lipid Panel    Component Value Date/Time   CHOL 173 05/27/2020 1008   TRIG 86 05/27/2020 1008   HDL 45 05/27/2020 1008   CHOLHDL 3.8 05/27/2020 1008   CHOLHDL 3.4 05/05/2018 0042   VLDL 17 05/05/2018 0042   LDLCALC 112 (H) 05/27/2020 1008    Physical Exam:    VS:  BP 123/87   Pulse 83   Ht 5\' 8"  (1.727 m)   Wt (!) 379 lb 6.4 oz (172.1 kg)   SpO2 96%   BMI 57.69 kg/m     Wt Readings from Last 3 Encounters:  06/10/20 (!) 379 lb 6.4 oz (172.1 kg)  04/30/20 (!) 375 lb 3.2 oz (170.2 kg)  11/28/19 (!) 365 lb (165.6 kg)     GEN: Morbidly/super morbid obesity with BMI greater than 55.. No acute distress HEENT: Normal NECK: No JVD. LYMPHATICS: No lymphadenopathy CARDIAC: No murmur. RRR no gallop, or edema. VASCULAR:  Normal Pulses. No bruits. RESPIRATORY:  Clear to auscultation without rales, wheezing or rhonchi  ABDOMEN: Soft, non-tender, non-distended, No pulsatile mass, MUSCULOSKELETAL: No deformity  SKIN: Warm and dry NEUROLOGIC:  Alert and oriented x 3 PSYCHIATRIC:  Normal affect   ASSESSMENT:    1. Coronary artery disease of native artery  of native heart with stable angina pectoris (HCC)   2. Hx of Inf MI (7/19 Tx with BMS to RCA; stent thrombosis 9/19 tx with DES to RCA; stent thrombosis 04/2018 tx with thrombectomy)   3. Antiphospholipid antibody syndrome (HCC)   4. TOBACCO DEPENDENCE   5. OBESITY, MORBID   6. Hyperlipidemia LDL goal <70   7. Educated about COVID-19 virus infection    PLAN:    In order of problems listed above:  1. No symptoms of ischemic disease.  She has had multiple interventions on the right coronary.  She has not controlling risk factors and therefore not doing a good job of secondary prevention.  This was reviewed in great detail.  See below. 2. She has not missed doses of her anticoagulant or antiplatelet agent.  No bleeding complications. 3. Continue anticoagulation therapy. 4. Strongly recommended that she discontinue cigarette smoking. 5. We discussed bariatric surgery but with her clotting tendency and secondary bleeding tendency do anticoagulation therapy, she would not be a good candidate. Recommended a dramatic decrease in carbohydrate intake.  Discussed Mediterranean diet minus excessive fruit. 6. Continue high intensity statin therapy.  Decrease dietary fat intake.  Target LDL less than 70. 7. Vaccinated and practicing mitigation.  Next appointment will be in greater than a year.  Her commitment to her job with relocation to [place] will last at least 12 months.  We will look forward to seeing her when she returns.  Overall education and awareness concerning secondary risk prevention was discussed in detail: LDL less than 70, hemoglobin A1c less than 7, blood pressure target less than 130/80 mmHg, >150 minutes of moderate aerobic activity per week, avoidance of smoking, weight control (via diet and exercise), and continued surveillance/management of/for obstructive sleep apnea.    Medication Adjustments/Labs and Tests Ordered: Current medicines are reviewed at length with the patient today.   Concerns regarding medicines are outlined above.  Orders Placed This Encounter  Procedures  . EKG 12-Lead   No orders of the defined types were placed in this encounter.   Patient Instructions  Medication Instructions:  Your physician  recommends that you continue on your current medications as directed. Please refer to the Current Medication list given to you today.  *If you need a refill on your cardiac medications before your next appointment, please call your pharmacy*   Lab Work: None If you have labs (blood work) drawn today and your tests are completely normal, you will receive your results only by: Marland Kitchen MyChart Message (if you have MyChart) OR . A paper copy in the mail If you have any lab test that is abnormal or we need to change your treatment, we will call you to review the results.   Testing/Procedures: None   Follow-Up: At [facility], you and your health needs are our priority.  As part of our continuing mission to provide you with exceptional heart care, we have created designated Provider Care Teams.  These Care Teams include your primary Cardiologist (physician) and Advanced Practice Providers (APPs -  Physician Assistants and Nurse Practitioners) who all work together to provide you with the care you need, when you need it.  We recommend signing up for the patient portal called "MyChart".  Sign up information is provided on this After Visit Summary.  MyChart is used to connect with patients for Virtual Visits (Telemedicine).  Patients are able to view lab/test results, encounter notes, upcoming appointments, etc.  Non-urgent messages can be sent to your provider as well.   To learn more about what you can do with MyChart, go to [email or web address].    Your next appointment:   As needed  The format for your next appointment:   In Person  Provider:   You may see Lesleigh Noe, MD or one of the following Advanced Practice Providers on your designated  Care Team:    Georgie Chard, NP    Other Instructions      Signed, Lesleigh Noe, MD  06/10/2020 3:23 PM    [facility]

## 2020-06-10 ENCOUNTER — Other Ambulatory Visit: Payer: Self-pay

## 2020-06-10 ENCOUNTER — Ambulatory Visit (INDEPENDENT_AMBULATORY_CARE_PROVIDER_SITE_OTHER): Payer: Federal, State, Local not specified - PPO | Admitting: Interventional Cardiology

## 2020-06-10 ENCOUNTER — Encounter: Payer: Self-pay | Admitting: Interventional Cardiology

## 2020-06-10 VITALS — BP 123/87 | HR 83 | Ht 68.0 in | Wt 379.4 lb

## 2020-06-10 DIAGNOSIS — I25118 Atherosclerotic heart disease of native coronary artery with other forms of angina pectoris: Secondary | ICD-10-CM | POA: Diagnosis not present

## 2020-06-10 DIAGNOSIS — D6861 Antiphospholipid syndrome: Secondary | ICD-10-CM

## 2020-06-10 DIAGNOSIS — F17200 Nicotine dependence, unspecified, uncomplicated: Secondary | ICD-10-CM | POA: Diagnosis not present

## 2020-06-10 DIAGNOSIS — I252 Old myocardial infarction: Secondary | ICD-10-CM | POA: Diagnosis not present

## 2020-06-10 DIAGNOSIS — Z7189 Other specified counseling: Secondary | ICD-10-CM

## 2020-06-10 DIAGNOSIS — E785 Hyperlipidemia, unspecified: Secondary | ICD-10-CM

## 2020-06-10 NOTE — Patient Instructions (Signed)
Medication Instructions:  Your physician recommends that you continue on your current medications as directed. Please refer to the Current Medication list given to you today.  *If you need a refill on your cardiac medications before your next appointment, please call your pharmacy*   Lab Work: None If you have labs (blood work) drawn today and your tests are completely normal, you will receive your results only by: MyChart Message (if you have MyChart) OR A paper copy in the mail If you have any lab test that is abnormal or we need to change your treatment, we will call you to review the results.   Testing/Procedures: None   Follow-Up: At [facility], you and your health needs are our priority.  As part of our continuing mission to provide you with exceptional heart care, we have created designated Provider Care Teams.  These Care Teams include your primary Cardiologist (physician) and Advanced Practice Providers (APPs -  Physician Assistants and Nurse Practitioners) who all work together to provide you with the care you need, when you need it.  We recommend signing up for the patient portal called "MyChart".  Sign up information is provided on this After Visit Summary.  MyChart is used to connect with patients for Virtual Visits (Telemedicine).  Patients are able to view lab/test results, encounter notes, upcoming appointments, etc.  Non-urgent messages can be sent to your provider as well.   To learn more about what you can do with MyChart, go to [email or web address].    Your next appointment:   As needed  The format for your next appointment:   In Person  Provider:   You may see Henry W Smith III, MD or one of the following Advanced Practice Providers on your designated Care Team:   Jill McDaniel, NP   Other Instructions   

## 2020-06-12 ENCOUNTER — Other Ambulatory Visit: Payer: Self-pay

## 2020-06-12 ENCOUNTER — Encounter: Payer: Self-pay | Admitting: Family Medicine

## 2020-06-12 ENCOUNTER — Ambulatory Visit (INDEPENDENT_AMBULATORY_CARE_PROVIDER_SITE_OTHER): Payer: Federal, State, Local not specified - PPO | Admitting: Family Medicine

## 2020-06-12 ENCOUNTER — Ambulatory Visit (HOSPITAL_COMMUNITY)
Admission: RE | Admit: 2020-06-12 | Discharge: 2020-06-12 | Disposition: A | Discharge status: Home / Self Care | Payer: Federal, State, Local not specified - PPO | Source: Ambulatory Visit | Attending: Family Medicine | Admitting: Family Medicine

## 2020-06-12 VITALS — BP 134/80 | HR 85 | Wt 375.2 lb

## 2020-06-12 DIAGNOSIS — M79604 Pain in right leg: Secondary | ICD-10-CM | POA: Diagnosis not present

## 2020-06-12 MED ORDER — HYDROCODONE-ACETAMINOPHEN 5-325 MG PO TABS: 1.0000 | ORAL_TABLET | Freq: Four times a day (QID) | ORAL | 0 refills | Status: DC | PRN

## 2020-06-12 MED ORDER — DEXAMETHASONE 6 MG PO TABS: 6.0000 mg | ORAL_TABLET | Freq: Every day | ORAL | 0 refills | Status: AC

## 2020-06-12 NOTE — Progress Notes (Signed)
Right lower extremity venous duplex has been completed. Preliminary results can be found in CV Proc through chart review.  Results were given to Dr. Pollie Meyer.  06/12/20 1:38 PM Olen Cordial RVT

## 2020-06-12 NOTE — Progress Notes (Signed)
  Date of Visit: 06/12/2020   SUBJECTIVE:   HPI:  Angelica Brown presents today for pain in R leg.  Began hurting about Feb 12. No injuries preceding onset of pain. She is leaving 3/5 for [place]. Pain overall the same, not getting better or worse. Has tried ice, heat, and massage. Went to ED and had bedside u/s which was negative for DVT (was not formal study). Pain is worse with sitting, better with walking. Aleve helps it some. Denies back pain. Has not taken her effient in about 5 days as she has instead chosen to take aleve. ED also gave her a dose of dexamethasone which helped for several days. Pain is located in upper leg radiating down below her knee  OBJECTIVE:   BP 134/80   Pulse 85   Wt (!) 375 lb 3.2 oz (170.2 kg)   SpO2 95%   BMI 57.05 kg/m  Gen: no acute distress, pleasant, cooperative HEENT: normocephalic, atraumatic  Lungs: normal work of breathing  Neuro: alert, speech normal, grossly nonfocal Ext: RLE with tenderness ot palpation in upper leg. No warmth or erythema. Full strength with hip flexion, abduction, adduction.  ASSESSMENT/PLAN:   R leg pain Etiology unclear. Pain seems radicular, though she denies back pain. Given recurrent VTE greatest concern want to rule out acutely is DVT.  - Ordered formal DVT u/s study to rule this out definitively.  - Given she had improvement with dexamethasone, will do dexamethasone steroid burst.  - Strongly advised stopping aleve and taking her effient - has had thrombosis of stents in the past when she did not take effient.  - Refill norco for short term, patient aware this will not be a prolonged medication. PDMP reviewed with appropriate findings. - Follow up if not improving on this regimen, at which time will refer to orthopedics.  [name], MD [facility]

## 2020-06-12 NOTE — Patient Instructions (Signed)
Getting ultrasound of your leg today  Start dexamethasone 6mg  daily for 5 days  Refilled norco for short term so you have it  Please take your heart medication  If you are not doing better with this regimen please call me and I will refer you to an orthopedic specialist  Be well, Dr. 

## 2020-06-17 ENCOUNTER — Telehealth: Payer: Self-pay | Admitting: Family Medicine

## 2020-06-17 NOTE — Telephone Encounter (Signed)
Patient is calling and would like for Dr. Pollie Meyer to call her to discuss her medications. She said she was told to call and let her know how it is effecting her.   The best call back number is [phone]

## 2020-06-18 NOTE — Telephone Encounter (Signed)
Returned call to patient Reports steroid is helping her leg a ton - feels much better. Has one more day of it left.  She wanted to talk about something she read online, that dexamethasone can be used to treat multiple myeloma. It made her nervous this is what she could have because her dad had this before he died.  Reviewed recent labs - no hypercalcemia, normal renal function. Recent xrays without lytic lesions. Advised I think MM is very unlikely and is not something she needs to be worried about right now.  Patient appreciative. Leeanne Rio, MD

## 2020-07-08 ENCOUNTER — Encounter (HOSPITAL_BASED_OUTPATIENT_CLINIC_OR_DEPARTMENT_OTHER): Payer: Self-pay | Admitting: Emergency Medicine

## 2020-07-08 ENCOUNTER — Other Ambulatory Visit: Payer: Self-pay

## 2020-07-08 ENCOUNTER — Ambulatory Visit (HOSPITAL_COMMUNITY): Admission: EM | Admit: 2020-07-08 | Discharge: 2020-07-08 | Payer: Federal, State, Local not specified - PPO

## 2020-07-08 ENCOUNTER — Emergency Department (HOSPITAL_BASED_OUTPATIENT_CLINIC_OR_DEPARTMENT_OTHER)
Admission: EM | Admit: 2020-07-08 | Discharge: 2020-07-08 | Disposition: A | Discharge status: Home / Self Care | Payer: Federal, State, Local not specified - PPO | Attending: Emergency Medicine | Admitting: Emergency Medicine

## 2020-07-08 ENCOUNTER — Emergency Department (HOSPITAL_BASED_OUTPATIENT_CLINIC_OR_DEPARTMENT_OTHER): Payer: Federal, State, Local not specified - PPO

## 2020-07-08 ENCOUNTER — Encounter (HOSPITAL_COMMUNITY): Payer: Self-pay | Admitting: Emergency Medicine

## 2020-07-08 DIAGNOSIS — Z7901 Long term (current) use of anticoagulants: Secondary | ICD-10-CM | POA: Diagnosis not present

## 2020-07-08 DIAGNOSIS — D6859 Other primary thrombophilia: Secondary | ICD-10-CM

## 2020-07-08 DIAGNOSIS — Z86718 Personal history of other venous thrombosis and embolism: Secondary | ICD-10-CM | POA: Diagnosis not present

## 2020-07-08 DIAGNOSIS — M79604 Pain in right leg: Secondary | ICD-10-CM | POA: Diagnosis not present

## 2020-07-08 DIAGNOSIS — I5022 Chronic systolic (congestive) heart failure: Secondary | ICD-10-CM | POA: Insufficient documentation

## 2020-07-08 DIAGNOSIS — L03115 Cellulitis of right lower limb: Secondary | ICD-10-CM | POA: Diagnosis not present

## 2020-07-08 DIAGNOSIS — F17210 Nicotine dependence, cigarettes, uncomplicated: Secondary | ICD-10-CM | POA: Insufficient documentation

## 2020-07-08 DIAGNOSIS — I8001 Phlebitis and thrombophlebitis of superficial vessels of right lower extremity: Secondary | ICD-10-CM

## 2020-07-08 DIAGNOSIS — Z79899 Other long term (current) drug therapy: Secondary | ICD-10-CM | POA: Insufficient documentation

## 2020-07-08 DIAGNOSIS — R2241 Localized swelling, mass and lump, right lower limb: Secondary | ICD-10-CM

## 2020-07-08 DIAGNOSIS — I2510 Atherosclerotic heart disease of native coronary artery without angina pectoris: Secondary | ICD-10-CM | POA: Insufficient documentation

## 2020-07-08 DIAGNOSIS — I110 Hypertensive heart disease with heart failure: Secondary | ICD-10-CM | POA: Insufficient documentation

## 2020-07-08 DIAGNOSIS — M7989 Other specified soft tissue disorders: Secondary | ICD-10-CM | POA: Diagnosis not present

## 2020-07-08 MED ORDER — SULFAMETHOXAZOLE-TRIMETHOPRIM 800-160 MG PO TABS: 1.0000 | ORAL_TABLET | Freq: Two times a day (BID) | ORAL | 0 refills | Status: AC

## 2020-07-08 MED ORDER — KETOROLAC TROMETHAMINE 30 MG/ML IJ SOLN
30.0000 mg | Freq: Once | INTRAMUSCULAR | Status: AC
  Administered 2020-07-08: 30 mg via INTRAMUSCULAR
  Filled 2020-07-08: qty 1

## 2020-07-08 NOTE — ED Notes (Signed)
Patient is being discharged from the Urgent Care and sent to the Emergency Department via personal vehicle . Per provider Wendee Beavers, patient is in need of higher level of care due to possible DVT. Patient is aware and verbalizes understanding of plan of care.   Vitals:   07/08/20 1511  BP: 127/62  Pulse: 62  Resp: 20  Temp: 99.1 F (37.3 C)  SpO2: 96%

## 2020-07-08 NOTE — ED Triage Notes (Signed)
Pt states that she has a lump on her right inner thigh that is painful 10 out of 10. Pain started 4 days ago and the lump on her leg has grown. Pt has Hx of blood clots.

## 2020-07-08 NOTE — ED Triage Notes (Signed)
Pt presents today with c/o of right thigh swelling x 3 days. She is concerned that it could be blood clot. She has history of DVTs and is on anticoagulants. PCP suggested she be evaluated by [facility].

## 2020-07-08 NOTE — ED Notes (Signed)
The RN called Vascular Department to schedule Ultrasound. They are completely booked today, next available appt tomorrow morning at 9:00am. Provider notified of same.

## 2020-07-08 NOTE — ED Provider Notes (Signed)
[facility]    CSN: [id number] Arrival date & time: 07/08/20  1426      History   Chief Complaint Chief Complaint  Patient presents with  . Leg Swelling    Right thigh    HPI Angelica Brown is a 45 y.o. female.   Patient presents with 3-day history of painful mass on her right inner thigh.  No falls or injury.  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  She denies numbness, weakness, paresthesias.  Her medical history includes clotting disorder STEMI, hypertension, CHF, DVT, morbid obesity.  The history is provided by the patient and medical records.    Past Medical History:  Diagnosis Date  . ACS (acute coronary syndrome) (HCC) 05/04/2018  . CHOLELITHIASIS   . Chronic systolic CHF (congestive heart failure) (HCC) 06/13/2016   Ischemic CM (inf MI 05/2018) >> Echo 05/04/2018 - Mod LVH, EF 40-45, inf-lat HK, PASP 47 (mild pul HTN)  . Coronary artery disease   . Dermatophytosis of nail   . 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 GERD (gastroesophageal reflux disease)   . Gestational diabetes    "w/all 4 pregnancies"  . Heart murmur   . Hypertension   . Metrorrhagia   . Migraine    "at least twice/month" (05/28/2016)  . Nystagmus 06/30/2016  . OBESITY, MORBID   . OBESITY, NOS   . Pneumonia 11/2014  . POSTURAL LIGHTHEADEDNESS   . Sickle cell trait (HCC)   . STEMI (ST elevation myocardial infarction) (HCC) 11/09/2017   in [place], [place] - see records in Care Everywhere  . SYNCOPE   . Throat pain   . TOBACCO DEPENDENCE   . URI   . Urinary frequency     Patient Active Problem List   Diagnosis Date Noted  . Hypertension 05/27/2020  . Lateral epicondylitis 04/30/2020  . Prediabetes 11/27/2019  . Otitis externa 11/07/2019  . Pelvic pain 08/06/2019  . OSA (obstructive sleep apnea) 08/06/2019  . Left  leg pain 03/22/2019  . Acute left-sided low back pain without sciatica 11/21/2018  . Left flank pain 11/14/2018  . Hx of Inf MI (7/19 Tx with BMS to RCA; stent thrombosis 9/19 tx with DES to RCA; stent thrombosis 04/2018 tx with thrombectomy) 05/04/2018  . Bradycardia 04/20/2018  . History of MI (myocardial infarction) 04/20/2018  . 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 Seborrheic dermatitis 10/27/2016  . Nystagmus 06/30/2016  . Chronic systolic CHF (congestive heart failure) (HCC) 06/13/2016  . Acute stress reaction 06/09/2016  . Mastoiditis of right side 05/29/2016  . 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 Papilledema of both eyes   . Bacterial meningitis   . Headache 05/26/2016  . Onychomycosis 04/16/2013  . Family history of breast cancer 01/17/2013  . Sickle cell trait (HCC)   . Dizziness 08/29/2008  . OBESITY, MORBID 07/10/2008  . SYNCOPE 10/24/2006  . TOBACCO DEPENDENCE 06/16/2006  . METRORRHAGIA 06/16/2006    Past Surgical History:  Procedure Laterality Date  . BARTHOLIN GLAND CYST EXCISION Bilateral 2004  . CORONARY THROMBECTOMY N/A 05/04/2018   Procedure: Coronary Thrombectomy;  Surgeon: Lyn Records, MD;  Location: [facility] INVASIVE CV LAB;  Service: Cardiovascular;  Laterality: N/A;  . EXCISIONAL HEMORRHOIDECTOMY  2006   "removed a blood clot from one"  . LEFT HEART CATH AND CORONARY ANGIOGRAPHY N/A 05/04/2018  Procedure: LEFT HEART CATH AND CORONARY ANGIOGRAPHY;  Surgeon: Lyn Records, MD;  Location: [facility] INVASIVE CV LAB;  Service: Cardiovascular;  Laterality: N/A;  . TUBAL LIGATION  2007  . WISDOM TOOTH EXTRACTION  1998    OB History    Gravida  5   Para  5   Term  4   Preterm  1   AB      Living  4     SAB      IAB      Ectopic      Multiple  1   Live Births               Home Medications    Prior to Admission medications   Medication Sig Start Date End Date Taking? Authorizing Provider  diclofenac Sodium  (VOLTAREN) 1 % GEL Apply 4 g topically 4 (four) times daily. 04/30/20  Yes Simmons-Robinson, Makiera, MD  HYDROcodone-acetaminophen (NORCO/VICODIN) 5-325 MG tablet Take 1 tablet by mouth every 6 (six) hours as needed for severe pain. 06/12/20  Yes Latrelle Dodrill, MD  lisinopril (ZESTRIL) 10 MG tablet Take 10 mg by mouth daily. Correct dose unknown   Yes [provider]  metoprolol tartrate (LOPRESSOR) 25 MG tablet TAKE 1/2 (ONE-HALF) TABLET BY MOUTH ONCE DAILY WITH BREAKFAST 01/18/20  Yes Latrelle Dodrill, MD  prasugrel (EFFIENT) 10 MG TABS tablet Take 1 tablet by mouth once daily 01/16/20  Yes Latrelle Dodrill, MD  rivaroxaban (XARELTO) 20 MG TABS tablet Take 1 tablet (20 mg total) by mouth daily. 05/11/19  Yes Serena Croissant, MD  rosuvastatin (CRESTOR) 40 MG tablet Take 1 tablet (40 mg total) by mouth daily. 07/31/19  Yes Latrelle Dodrill, MD  topiramate (TOPAMAX) 50 MG tablet Take 1 tablet by mouth once daily 01/16/20  Yes Latrelle Dodrill, MD  acetaminophen (TYLENOL) 500 MG tablet Take 500-1,000 mg by mouth every 6 (six) hours as needed for mild pain or headache.    [provider]  cetirizine (ZYRTEC) 10 MG tablet Take 1 tablet (10 mg total) by mouth daily as needed for allergies. 10/29/19   Meccariello, Solmon Ice, DO  nitroGLYCERIN (NITROSTAT) 0.4 MG SL tablet Place 1 tablet (0.4 mg total) under the tongue every 5 (five) minutes x 3 doses as needed for chest pain. 05/27/20   Latrelle Dodrill, MD  omeprazole (PRILOSEC) 40 MG capsule Take 40 mg by mouth 2 (two) times daily as needed (for GERD-like symptoms).     [provider]    Family History Family History  Problem Relation Age of Onset  . Breast cancer Mother   . Cancer Father        MELANOMA  . Diabetes Father   . Diabetes Paternal Grandmother   . Diabetes Paternal Grandfather   . Breast cancer Maternal Aunt   . Breast cancer Paternal Aunt     Social History Social History   Tobacco Use   . Smoking status: Current Every Day Smoker    Packs/day: 0.15    Years: 18.00    Pack years: 2.70    Types: Cigarettes  . Smokeless tobacco: Never Used  Vaping Use  . Vaping Use: Never used  Substance Use Topics  . Alcohol use: No  . Drug use: No     Allergies   Ampicillin, Penicillins, Strawberry extract, Cephalexin, and Prednisone   Review of Systems Review of Systems  Constitutional: Negative for chills and fever.  HENT: Negative for ear pain  and sore throat.   Eyes: Negative for pain and visual disturbance.  Respiratory: Negative for cough and shortness of breath.   Cardiovascular: Negative for chest pain and palpitations.  Gastrointestinal: Negative for abdominal pain and vomiting.  Genitourinary: Negative for dysuria and hematuria.  Musculoskeletal: Negative for arthralgias and back pain.       Painful mass on inner right thigh.  Skin: Negative for color change and rash.  Neurological: Negative for syncope, weakness and numbness.  All other systems reviewed and are negative.    Physical Exam Triage Vital Signs ED Triage Vitals  Enc Vitals Group     BP      Pulse      Resp      Temp      Temp src      SpO2      Weight      Height      Head Circumference      Peak Flow      Pain Score      Pain Loc      Pain Edu?      Excl. in GC?    No data found.  Updated Vital Signs BP 127/62 (BP Location: Right Arm)   Pulse 62   Temp 99.1 F (37.3 C) (Oral)   Resp 20   SpO2 96%   Visual Acuity Right Eye Distance:   Left Eye Distance:   Bilateral Distance:    Right Eye Near:   Left Eye Near:    Bilateral Near:     Physical Exam Vitals and nursing note reviewed.  Constitutional:      General: She is not in acute distress.    Appearance: She is well-developed. She is obese.  HENT:     Head: Normocephalic and atraumatic.     Mouth/Throat:     Mouth: Mucous membranes are moist.  Eyes:     Conjunctiva/sclera: Conjunctivae normal.  Cardiovascular:      Rate and Rhythm: Normal rate and regular rhythm.     Heart sounds: Normal heart sounds.  Pulmonary:     Effort: Pulmonary effort is normal. No respiratory distress.     Breath sounds: Normal breath sounds.  Abdominal:     Palpations: Abdomen is soft.     Tenderness: There is no abdominal tenderness.  Musculoskeletal:        General: Swelling and tenderness present. Normal range of motion.     Cervical back: Neck supple.     Comments: Tender area of swelling on right inner thigh.  Skin:    General: Skin is warm and dry.     Findings: No bruising or erythema.  Neurological:     General: No focal deficit present.     Mental Status: She is alert and oriented to person, place, and time.     Gait: Gait normal.  Psychiatric:        Mood and Affect: Mood normal.        Behavior: Behavior normal.      UC Treatments / Results  Labs (all labs ordered are listed, but only abnormal results are displayed) Labs Reviewed - No data to display  EKG   Radiology No results found.  Procedures Procedures (including critical care time)  Medications Ordered in UC Medications - No data to display  Initial Impression / Assessment and Plan / UC Course  I have reviewed the triage vital signs and the nursing notes.  Pertinent labs & imaging results that were  available during my care of the patient were reviewed by me and considered in my medical decision making (see chart for details).   Mass of right thigh, hypercoagulable state, history of DVT.  Outpatient ultrasound unable to see patient today.  Instructed patient to go to the ED for evaluation.  She agrees to plan of care.   Final Clinical Impressions(s) / UC Diagnoses   Final diagnoses:  Mass of right thigh  Hypercoagulable state (HCC)  History of DVT (deep vein thrombosis)     Discharge Instructions     Go to the emergency department for evaluation of the painful mass in your right thigh and concern for DVT.    ED  Prescriptions    None     PDMP not reviewed this encounter.   Mickie Bail, NP 07/08/20 [phone]

## 2020-07-08 NOTE — ED Provider Notes (Signed)
[facility] EMERGENCY DEPT Provider Note   CSN: [id number] Arrival date & time: 07/08/20  1606     History Chief Complaint  Patient presents with  . Leg Pain    Angelica Brown is a 45 y.o. female.  Pt presents to the ED today with a painful lump to her right upper thigh.  Pt has a hx of multiple clots and has been mostly compliant with her Xarelto.  She did miss 1 dose last week. Pt said the pain occurred about 4 days ago and the lump was noticed today.  Pt denies any cp/sob.        Past Medical History:  Diagnosis Date  . ACS (acute coronary syndrome) (HCC) 05/04/2018  . CHOLELITHIASIS   . Chronic systolic CHF (congestive heart failure) (HCC) 06/13/2016   Ischemic CM (inf MI 05/2018) >> Echo 05/04/2018 - Mod LVH, EF 40-45, inf-lat HK, PASP 47 (mild pul HTN)  . Coronary artery disease   . Dermatophytosis of nail   . 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 GERD (gastroesophageal reflux disease)   . Gestational diabetes    "w/all 4 pregnancies"  . Heart murmur   . Hypertension   . Metrorrhagia   . Migraine    "at least twice/month" (05/28/2016)  . Nystagmus 06/30/2016  . OBESITY, MORBID   . OBESITY, NOS   . Pneumonia 11/2014  . POSTURAL LIGHTHEADEDNESS   . Sickle cell trait (HCC)   . STEMI (ST elevation myocardial infarction) (HCC) 11/09/2017   in [place], [place] - see records in Care Everywhere  . SYNCOPE   . Throat pain   . TOBACCO DEPENDENCE   . URI   . Urinary frequency     Patient Active Problem List   Diagnosis Date Noted  . Hypertension 05/27/2020  . Lateral epicondylitis 04/30/2020  . Prediabetes 11/27/2019  . Otitis externa 11/07/2019  . Pelvic pain 08/06/2019  . OSA (obstructive sleep apnea) 08/06/2019  . Left leg pain 03/22/2019  . Acute left-sided low back pain without sciatica 11/21/2018  . Left flank pain 11/14/2018  . Hx of Inf MI (7/19  Tx with BMS to RCA; stent thrombosis 9/19 tx with DES to RCA; stent thrombosis 04/2018 tx with thrombectomy) 05/04/2018  . Bradycardia 04/20/2018  . History of MI (myocardial infarction) 04/20/2018  . 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 Seborrheic dermatitis 10/27/2016  . Nystagmus 06/30/2016  . Chronic systolic CHF (congestive heart failure) (HCC) 06/13/2016  . Acute stress reaction 06/09/2016  . Mastoiditis of right side 05/29/2016  . 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 Papilledema of both eyes   . Bacterial meningitis   . Headache 05/26/2016  . Onychomycosis 04/16/2013  . Family history of breast cancer 01/17/2013  . Sickle cell trait (HCC)   . Dizziness 08/29/2008  . OBESITY, MORBID 07/10/2008  . SYNCOPE 10/24/2006  . TOBACCO DEPENDENCE 06/16/2006  . METRORRHAGIA 06/16/2006    Past Surgical History:  Procedure Laterality Date  . BARTHOLIN GLAND CYST EXCISION Bilateral 2004  . CORONARY THROMBECTOMY N/A 05/04/2018   Procedure: Coronary Thrombectomy;  Surgeon: Lyn Records, MD;  Location: [facility] INVASIVE CV LAB;  Service: Cardiovascular;  Laterality: N/A;  . EXCISIONAL HEMORRHOIDECTOMY  2006   "removed a blood clot from one"  . LEFT HEART CATH AND CORONARY ANGIOGRAPHY N/A 05/04/2018   Procedure: LEFT HEART CATH AND CORONARY ANGIOGRAPHY;  Surgeon: Lyn Records, MD;  Location:  [facility] INVASIVE CV LAB;  Service: Cardiovascular;  Laterality: N/A;  . TUBAL LIGATION  2007  . WISDOM TOOTH EXTRACTION  1998     OB History    Gravida  5   Para  5   Term  4   Preterm  1   AB      Living  4     SAB      IAB      Ectopic      Multiple  1   Live Births              Family History  Problem Relation Age of Onset  . Breast cancer Mother   . Cancer Father        MELANOMA  . Diabetes Father   . Diabetes Paternal Grandmother   . Diabetes Paternal Grandfather   . Breast cancer Maternal Aunt   . Breast cancer Paternal Aunt     Social History    Tobacco Use  . Smoking status: Current Every Day Smoker    Packs/day: 0.15    Years: 18.00    Pack years: 2.70    Types: Cigarettes  . Smokeless tobacco: Never Used  Vaping Use  . Vaping Use: Never used  Substance Use Topics  . Alcohol use: No  . Drug use: No    Home Medications Prior to Admission medications   Medication Sig Start Date End Date Taking? Authorizing Provider  acetaminophen (TYLENOL) 500 MG tablet Take 500-1,000 mg by mouth every 6 (six) hours as needed for mild pain or headache.   Yes [provider]  HYDROcodone-acetaminophen (NORCO/VICODIN) 5-325 MG tablet Take 1 tablet by mouth every 6 (six) hours as needed for severe pain. 06/12/20  Yes Latrelle Dodrill, MD  lisinopril (ZESTRIL) 10 MG tablet Take 10 mg by mouth daily. Correct dose unknown   Yes [provider]  metoprolol tartrate (LOPRESSOR) 25 MG tablet TAKE 1/2 (ONE-HALF) TABLET BY MOUTH ONCE DAILY WITH BREAKFAST 01/18/20  Yes Latrelle Dodrill, MD  prasugrel (EFFIENT) 10 MG TABS tablet Take 1 tablet by mouth once daily 01/16/20  Yes Latrelle Dodrill, MD  rivaroxaban (XARELTO) 20 MG TABS tablet Take 1 tablet (20 mg total) by mouth daily. 05/11/19  Yes Serena Croissant, MD  rosuvastatin (CRESTOR) 40 MG tablet Take 1 tablet (40 mg total) by mouth daily. 07/31/19  Yes Latrelle Dodrill, MD  sulfamethoxazole-trimethoprim (BACTRIM DS) 800-160 MG tablet Take 1 tablet by mouth 2 (two) times daily for 7 days. 07/08/20 07/15/20 Yes Jacalyn Lefevre, MD  topiramate (TOPAMAX) 50 MG tablet Take 1 tablet by mouth once daily 01/16/20  Yes Latrelle Dodrill, MD  cetirizine (ZYRTEC) 10 MG tablet Take 1 tablet (10 mg total) by mouth daily as needed for allergies. 10/29/19   Meccariello, Solmon Ice, DO  diclofenac Sodium (VOLTAREN) 1 % GEL Apply 4 g topically 4 (four) times daily. 04/30/20   Simmons-Robinson, Tawanna Cooler, MD  nitroGLYCERIN (NITROSTAT) 0.4 MG SL tablet Place 1 tablet (0.4 mg total) under the tongue  every 5 (five) minutes x 3 doses as needed for chest pain. 05/27/20   Latrelle Dodrill, MD  omeprazole (PRILOSEC) 40 MG capsule Take 40 mg by mouth 2 (two) times daily as needed (for GERD-like symptoms).     [provider]    Allergies    Ampicillin, Penicillins, Strawberry extract, Cephalexin, and Prednisone  Review of Systems   Review of Systems  Musculoskeletal:       Right  leg pain  All other systems reviewed and are negative.   Physical Exam Updated Vital Signs BP (!) 126/57 (BP Location: Right Arm)   Pulse 61   Temp 98.5 F (36.9 C) (Oral)   Resp 20   Ht 5\' 9"  (1.753 m)   Wt (!) 163.3 kg   LMP 01/18/2020 (Approximate)   SpO2 97%   BMI 53.16 kg/m   Physical Exam Vitals and nursing note reviewed.  Constitutional:      Appearance: Normal appearance. She is obese.  HENT:     Head: Normocephalic and atraumatic.     Right Ear: External ear normal.     Left Ear: External ear normal.     Nose: Nose normal.     Mouth/Throat:     Mouth: Mucous membranes are moist.     Pharynx: Oropharynx is clear.  Eyes:     Extraocular Movements: Extraocular movements intact.     Conjunctiva/sclera: Conjunctivae normal.     Pupils: Pupils are equal, round, and reactive to light.  Cardiovascular:     Rate and Rhythm: Normal rate and regular rhythm.     Pulses: Normal pulses.     Heart sounds: Normal heart sounds.  Pulmonary:     Effort: Pulmonary effort is normal.     Breath sounds: Normal breath sounds.  Abdominal:     General: Abdomen is flat. Bowel sounds are normal.     Palpations: Abdomen is soft.  Musculoskeletal:        General: Normal range of motion.     Cervical back: Normal range of motion and neck supple.  Skin:    Capillary Refill: Capillary refill takes less than 2 seconds.     Comments: Superficial lump (quarter sized).  No fluctuance.  Neurological:     General: No focal deficit present.     Mental Status: She is alert and oriented to person,  place, and time.  Psychiatric:        Mood and Affect: Mood normal.     ED Results / Procedures / Treatments   Labs (all labs ordered are listed, but only abnormal results are displayed) Labs Reviewed - No data to display  EKG None  Radiology US Venous Img Lower Unilateral Right  Result Date: 07/08/2020 CLINICAL DATA:  Right lower extremity swelling. EXAM: Right LOWER EXTREMITY VENOUS DOPPLER ULTRASOUND TECHNIQUE: Gray-scale sonography with graded compression, as well as color Doppler and duplex ultrasound were performed to evaluate the lower extremity deep venous systems from the level of the common femoral vein and including the common femoral, femoral, profunda femoral, popliteal and calf veins including the posterior tibial, peroneal and gastrocnemius veins when visible. The superficial great saphenous vein was also interrogated. Spectral Doppler was utilized to evaluate flow at rest and with distal augmentation maneuvers in the common femoral, femoral and popliteal veins. COMPARISON:  None. FINDINGS: Contralateral Common Femoral Vein: Respiratory phasicity is normal and symmetric with the symptomatic side. No evidence of thrombus. Normal compressibility. Common Femoral Vein: No evidence of thrombus. Normal compressibility, respiratory phasicity and response to augmentation. Saphenofemoral Junction: No evidence of thrombus. Normal compressibility and flow on color Doppler imaging. Profunda Femoral Vein: No evidence of thrombus. Normal compressibility and flow on color Doppler imaging. Femoral Vein: No evidence of thrombus. Normal compressibility, respiratory phasicity and response to augmentation. Popliteal Vein: No evidence of thrombus. Normal compressibility, respiratory phasicity and response to augmentation. Calf Veins: No evidence of thrombus. Normal compressibility and flow on color Doppler imaging. Superficial Great  Saphenous Vein: No evidence of thrombus. Normal compressibility. Venous  Reflux:  None. Other Findings: There is noted a thrombosed varicosity in the right upper thigh consistent with superficial thrombophlebitis. IMPRESSION: No evidence of deep venous thrombosis. Probable superficial thrombophlebitis involving superficial varicosity in right upper thigh. Electronically Signed   By: Lupita Raider M.D.   On: 07/08/2020 17:56    Procedures Procedures   Medications Ordered in ED Medications  ketorolac (TORADOL) 30 MG/ML injection 30 mg (30 mg Intramuscular Given 07/08/20 1633)    ED Course  I have reviewed the triage vital signs and the nursing notes.  Pertinent labs & imaging results that were available during my care of the patient were reviewed by me and considered in my medical decision making (see chart for details).    MDM Rules/Calculators/A&P                          Pt may have a superficial thrombophlebitis.  It may also be a developing abscess.  Pt is encouraged to take every dose of her Xarelto.  She is put on abx.  Return if worse.  F/u with pcp. Final Clinical Impression(s) / ED Diagnoses Final diagnoses:  Thrombophlebitis of superficial veins of right lower extremity  Cellulitis of right lower extremity    Rx / DC Orders ED Discharge Orders         Ordered    sulfamethoxazole-trimethoprim (BACTRIM DS) 800-160 MG tablet  2 times daily        07/08/20 1803           Jacalyn Lefevre, MD 07/08/20 [phone]

## 2020-07-08 NOTE — Discharge Instructions (Addendum)
Go to the emergency department for evaluation of the painful mass in your right thigh and concern for DVT.

## 2020-07-09 ENCOUNTER — Other Ambulatory Visit: Payer: Self-pay | Admitting: Hematology and Oncology

## 2020-07-09 ENCOUNTER — Other Ambulatory Visit: Payer: Self-pay | Admitting: Family Medicine

## 2020-07-10 ENCOUNTER — Telehealth: Payer: Self-pay | Admitting: Hematology and Oncology

## 2020-07-10 NOTE — Telephone Encounter (Signed)
Scheduled appt per 3/23 sch msg. Called pt, no answer. Left msg with appt date and time.

## 2020-07-11 ENCOUNTER — Emergency Department (HOSPITAL_BASED_OUTPATIENT_CLINIC_OR_DEPARTMENT_OTHER): Payer: Federal, State, Local not specified - PPO

## 2020-07-11 ENCOUNTER — Emergency Department (HOSPITAL_BASED_OUTPATIENT_CLINIC_OR_DEPARTMENT_OTHER)
Admission: EM | Admit: 2020-07-11 | Discharge: 2020-07-11 | Disposition: A | Discharge status: Home / Self Care | Payer: Federal, State, Local not specified - PPO | Attending: Emergency Medicine | Admitting: Emergency Medicine

## 2020-07-11 ENCOUNTER — Encounter (HOSPITAL_BASED_OUTPATIENT_CLINIC_OR_DEPARTMENT_OTHER): Payer: Self-pay

## 2020-07-11 ENCOUNTER — Other Ambulatory Visit: Payer: Self-pay

## 2020-07-11 DIAGNOSIS — I5022 Chronic systolic (congestive) heart failure: Secondary | ICD-10-CM | POA: Diagnosis not present

## 2020-07-11 DIAGNOSIS — I2510 Atherosclerotic heart disease of native coronary artery without angina pectoris: Secondary | ICD-10-CM | POA: Diagnosis not present

## 2020-07-11 DIAGNOSIS — F17210 Nicotine dependence, cigarettes, uncomplicated: Secondary | ICD-10-CM | POA: Insufficient documentation

## 2020-07-11 DIAGNOSIS — Z79899 Other long term (current) drug therapy: Secondary | ICD-10-CM | POA: Diagnosis not present

## 2020-07-11 DIAGNOSIS — M79604 Pain in right leg: Secondary | ICD-10-CM | POA: Diagnosis not present

## 2020-07-11 DIAGNOSIS — Z7901 Long term (current) use of anticoagulants: Secondary | ICD-10-CM | POA: Insufficient documentation

## 2020-07-11 DIAGNOSIS — I82451 Acute embolism and thrombosis of right peroneal vein: Secondary | ICD-10-CM | POA: Diagnosis not present

## 2020-07-11 DIAGNOSIS — I110 Hypertensive heart disease with heart failure: Secondary | ICD-10-CM | POA: Diagnosis not present

## 2020-07-11 DIAGNOSIS — I8001 Phlebitis and thrombophlebitis of superficial vessels of right lower extremity: Secondary | ICD-10-CM

## 2020-07-11 LAB — BASIC METABOLIC PANEL
Anion gap: 10 (ref 5–15)
BUN: 10 mg/dL (ref 6–20)
CO2: 23 mmol/L (ref 22–32)
Calcium: 9.7 mg/dL (ref 8.9–10.3)
Chloride: 105 mmol/L (ref 98–111)
Creatinine, Ser: 0.85 mg/dL (ref 0.44–1.00)
GFR, Estimated: >60 mL/min (ref >60)
Glucose, Bld: 103 mg/dL — ABNORMAL HIGH (ref 70–99)
Potassium: 3.5 mmol/L (ref 3.5–5.1)
Sodium: 138 mmol/L (ref 135–145)

## 2020-07-11 LAB — CBC
HCT: 39.9 % (ref 36.0–46.0)
Hemoglobin: 13.7 g/dL (ref 12.0–15.0)
MCH: 27.9 pg (ref 26.0–34.0)
MCHC: 34.3 g/dL (ref 30.0–36.0)
MCV: 81.3 fL (ref 80.0–100.0)
Platelets: 383 10*3/uL (ref 150–400)
RBC: 4.91 MIL/uL (ref 3.87–5.11)
RDW: 16 % — ABNORMAL HIGH (ref 11.5–15.5)
WBC: 14.2 10*3/uL — ABNORMAL HIGH (ref 4.0–10.5)
nRBC: 0 % (ref 0.0–0.2)

## 2020-07-11 LAB — HEPATIC FUNCTION PANEL
ALT: 15 U/L (ref 0–44)
AST: 15 U/L (ref 15–41)
Albumin: 4.2 g/dL (ref 3.5–5.0)
Alkaline Phosphatase: 65 U/L (ref 38–126)
Bilirubin, Direct: 0.1 mg/dL (ref 0.0–0.2)
Indirect Bilirubin: 0.4 mg/dL (ref 0.3–0.9)
Total Bilirubin: 0.5 mg/dL (ref 0.3–1.2)
Total Protein: 7.5 g/dL (ref 6.5–8.1)

## 2020-07-11 LAB — PROTIME-INR
INR: 1 (ref 0.8–1.2)
Prothrombin Time: 12.8 seconds (ref 11.4–15.2)

## 2020-07-11 MED ORDER — WARFARIN SODIUM 5 MG PO TABS: 5.0000 mg | ORAL_TABLET | Freq: Every day | ORAL | 1 refills | Status: DC

## 2020-07-11 MED ORDER — OXYCODONE-ACETAMINOPHEN 5-325 MG PO TABS
1.0000 | ORAL_TABLET | Freq: Once | ORAL | Status: AC
Start: 2020-07-11 — End: 2020-07-11
  Administered 2020-07-11: 1 via ORAL
  Filled 2020-07-11: qty 1

## 2020-07-11 MED ORDER — WARFARIN SODIUM 5 MG PO TABS
5.0000 mg | ORAL_TABLET | Freq: Once | ORAL | Status: AC
  Administered 2020-07-11: 5 mg via ORAL
  Filled 2020-07-11: qty 1

## 2020-07-11 NOTE — ED Triage Notes (Signed)
Pt is c/o right  Calf pain and swelling and SOB  -     States has hx of blood clot disorder and on blood thinner meds   - was here  3 days back  And dx with abscess  -  Denies fever and CP   Had tylenol today at 2 pm

## 2020-07-11 NOTE — Discharge Instructions (Addendum)
Important Instructions - Discharge summary:  You were diagnosed with a new blood clot in your leg today.  This raises concerns that your current blood thinner is not working well enough.  I spoke to Dr. Pamelia Hoit, your hematologist, who advised that we start you on Coumadin (I.e. warfarin), another type of blood thinner.  You will need to follow up in the office with him early next week.  This is very important.  Their office should call you by Monday.  If you don't hear from them, please call the number above to ask about a follow up appointment next week.  Your coumadin level (INR) needs to be checked next week.  *   You should take your warfarin consistently at the same time every day.  You can do this in the evening or the morning.  Many people prefer to take it before bedtime.  *  Read over the instructions attached about Coumadin.  Pay attention to the types of foods that are safe to eat or not eat on coumadin.  Dr. Earmon Phoenix office will need to make arrangements to check your INR level this week.  This is a blood test that tells [place] if your coumadin dosing is too high, too low, or needs adjustment.  *  Starting tomorrow (07/12/20), please STOP taking your xarelto.  You can continue your other medications for now.  You should finish your course of bactrim for skin infection.  *  We talked about the risk of pulmonary embolism (clots in the lungs).  We discussed observation in the hospital versus discharge home.  You preferred to go home.  If you develop sudden chest pain, shortness of breath, lightheadedness, or feel like passing out, call 911 and come back to the ER immediately.  Likewise if you develop a sudden, severe headache, or stroke symptoms, you should call 911 to come back to the hospital.

## 2020-07-11 NOTE — ED Provider Notes (Signed)
[facility] EMERGENCY DEPT Provider Note   CSN: [id number] Arrival date & time: 07/11/20  1944     History Chief Complaint  Patient presents with  . Leg Swelling    Angelica Brown is a 45 y.o. female w/ hx of obesity, clotting disorder, MI, HTN, DVT, on xarelto, presenting to ED with right leg pain.  Seen 3 days ago in ED for right upper calf pain and tenderness, had DVT ultrasound performed which was notable for right superficial thromboplebitis.  Started on bactrim at that time empirically for "possible early abscess," and has been taking all medications since then.  She reports redness and pain now extending down the back of her right leg, new from previous findings.  It is painful, worse with any leg movement.  HPI     Past Medical History:  Diagnosis Date  . ACS (acute coronary syndrome) (HCC) 05/04/2018  . CHOLELITHIASIS   . Chronic systolic CHF (congestive heart failure) (HCC) 06/13/2016   Ischemic CM (inf MI 05/2018) >> Echo 05/04/2018 - Mod LVH, EF 40-45, inf-lat HK, PASP 47 (mild pul HTN)  . Coronary artery disease   . Dermatophytosis of nail   . 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 GERD (gastroesophageal reflux disease)   . Gestational diabetes    "w/all 4 pregnancies"  . Heart murmur   . Hypertension   . Metrorrhagia   . Migraine    "at least twice/month" (05/28/2016)  . Nystagmus 06/30/2016  . OBESITY, MORBID   . OBESITY, NOS   . Pneumonia 11/2014  . POSTURAL LIGHTHEADEDNESS   . Sickle cell trait (HCC)   . STEMI (ST elevation myocardial infarction) (HCC) 11/09/2017   in [place], [place] - see records in Care Everywhere  . SYNCOPE   . Throat pain   . TOBACCO DEPENDENCE   . URI   . Urinary frequency     Patient Active Problem List   Diagnosis Date Noted  . Hypertension 05/27/2020  . Lateral epicondylitis 04/30/2020  . Prediabetes 11/27/2019  .  Otitis externa 11/07/2019  . Pelvic pain 08/06/2019  . OSA (obstructive sleep apnea) 08/06/2019  . Left leg pain 03/22/2019  . Acute left-sided low back pain without sciatica 11/21/2018  . Left flank pain 11/14/2018  . Hx of Inf MI (7/19 Tx with BMS to RCA; stent thrombosis 9/19 tx with DES to RCA; stent thrombosis 04/2018 tx with thrombectomy) 05/04/2018  . Bradycardia 04/20/2018  . History of MI (myocardial infarction) 04/20/2018  . 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 Seborrheic dermatitis 10/27/2016  . Nystagmus 06/30/2016  . Chronic systolic CHF (congestive heart failure) (HCC) 06/13/2016  . Acute stress reaction 06/09/2016  . Mastoiditis of right side 05/29/2016  . 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 Papilledema of both eyes   . Bacterial meningitis   . Headache 05/26/2016  . Onychomycosis 04/16/2013  . Family history of breast cancer 01/17/2013  . Sickle cell trait (HCC)   . Dizziness 08/29/2008  . OBESITY, MORBID 07/10/2008  . SYNCOPE 10/24/2006  . TOBACCO DEPENDENCE 06/16/2006  . METRORRHAGIA 06/16/2006    Past Surgical History:  Procedure Laterality Date  . BARTHOLIN GLAND CYST EXCISION Bilateral 2004  . CORONARY THROMBECTOMY N/A 05/04/2018   Procedure: Coronary Thrombectomy;  Surgeon: Lyn Records, MD;  Location: [facility] INVASIVE CV LAB;  Service: Cardiovascular;  Laterality: N/A;  . EXCISIONAL HEMORRHOIDECTOMY  2006   "removed a blood  clot from one"  . LEFT HEART CATH AND CORONARY ANGIOGRAPHY N/A 05/04/2018   Procedure: LEFT HEART CATH AND CORONARY ANGIOGRAPHY;  Surgeon: Lyn Records, MD;  Location: [facility] INVASIVE CV LAB;  Service: Cardiovascular;  Laterality: N/A;  . TUBAL LIGATION  2007  . WISDOM TOOTH EXTRACTION  1998     OB History    Gravida  5   Para  5   Term  4   Preterm  1   AB      Living  4     SAB      IAB      Ectopic      Multiple  1   Live Births              Family History  Problem Relation Age of  Onset  . Breast cancer Mother   . Cancer Father        MELANOMA  . Diabetes Father   . Diabetes Paternal Grandmother   . Diabetes Paternal Grandfather   . Breast cancer Maternal Aunt   . Breast cancer Paternal Aunt     Social History   Tobacco Use  . Smoking status: Current Every Day Smoker    Packs/day: 0.15    Years: 18.00    Pack years: 2.70    Types: Cigarettes  . Smokeless tobacco: Never Used  Vaping Use  . Vaping Use: Never used  Substance Use Topics  . Alcohol use: No  . Drug use: No    Home Medications Prior to Admission medications   Medication Sig Start Date End Date Taking? Authorizing Provider  warfarin (COUMADIN) 5 MG tablet Take 1 tablet (5 mg total) by mouth daily. 07/12/20 08/11/20 Yes Dayzha Pogosyan, Kermit Balo, MD  acetaminophen (TYLENOL) 500 MG tablet Take 500-1,000 mg by mouth every 6 (six) hours as needed for mild pain or headache.    [provider]  cetirizine (ZYRTEC) 10 MG tablet Take 1 tablet (10 mg total) by mouth daily as needed for allergies. 10/29/19   Meccariello, Solmon Ice, DO  diclofenac Sodium (VOLTAREN) 1 % GEL Apply 4 g topically 4 (four) times daily. 04/30/20   Simmons-Robinson, Tawanna Cooler, MD  HYDROcodone-acetaminophen (NORCO/VICODIN) 5-325 MG tablet Take 1 tablet by mouth every 6 (six) hours as needed for severe pain. 06/12/20   Latrelle Dodrill, MD  lisinopril (ZESTRIL) 10 MG tablet Take 10 mg by mouth daily. Correct dose unknown    [provider]  metoprolol tartrate (LOPRESSOR) 25 MG tablet TAKE 1/2 (ONE-HALF) TABLET BY MOUTH ONCE DAILY WITH BREAKFAST 01/18/20   Latrelle Dodrill, MD  nitroGLYCERIN (NITROSTAT) 0.4 MG SL tablet Place 1 tablet (0.4 mg total) under the tongue every 5 (five) minutes x 3 doses as needed for chest pain. 05/27/20   Latrelle Dodrill, MD  omeprazole (PRILOSEC) 40 MG capsule Take 40 mg by mouth 2 (two) times daily as needed (for GERD-like symptoms).     [provider]  prasugrel (EFFIENT) 10 MG  TABS tablet Take 1 tablet by mouth once daily 01/16/20   Latrelle Dodrill, MD  rosuvastatin (CRESTOR) 40 MG tablet Take 1 tablet by mouth once daily 07/11/20   Carney Living, MD  sulfamethoxazole-trimethoprim (BACTRIM DS) 800-160 MG tablet Take 1 tablet by mouth 2 (two) times daily for 7 days. 07/08/20 07/15/20  Jacalyn Lefevre, MD  topiramate (TOPAMAX) 50 MG tablet Take 1 tablet by mouth once daily 07/11/20   Chambliss, Estill Batten, MD  XARELTO 20 MG  TABS tablet Take 1 tablet by mouth once daily 07/09/20 07/11/20  Serena Croissant, MD    Allergies    Ampicillin, Penicillins, Strawberry extract, Cephalexin, and Prednisone  Review of Systems   Review of Systems  Constitutional: Negative for chills and fever.  Eyes: Negative for pain and visual disturbance.  Respiratory: Negative for cough and shortness of breath.   Cardiovascular: Negative for chest pain and palpitations.  Gastrointestinal: Negative for abdominal pain and vomiting.  Musculoskeletal: Positive for arthralgias and myalgias.  Skin: Negative for color change and rash.  Neurological: Negative for weakness and numbness.  All other systems reviewed and are negative.   Physical Exam Updated Vital Signs BP 109/65   Pulse 66   Temp 98.4 F (36.9 C) (Oral)   Resp 18   Ht 5\' 6"  (1.676 m)   Wt (!) 160.6 kg   SpO2 96%   BMI 57.14 kg/m   Physical Exam Constitutional:      General: She is not in acute distress.    Appearance: She is obese.  HENT:     Head: Normocephalic and atraumatic.  Eyes:     Conjunctiva/sclera: Conjunctivae normal.     Pupils: Pupils are equal, round, and reactive to light.  Cardiovascular:     Rate and Rhythm: Normal rate and regular rhythm.     Pulses: Normal pulses.  Pulmonary:     Effort: Pulmonary effort is normal. No respiratory distress.  Abdominal:     General: There is no distension.     Tenderness: There is no abdominal tenderness.  Musculoskeletal:     Comments: Posterior calf  redness and tenderness, punctate tenderness, cord-like firmness of mid posterior calf   Skin:    General: Skin is warm and dry.  Neurological:     General: No focal deficit present.     Mental Status: She is alert. Mental status is at baseline.  Psychiatric:        Mood and Affect: Mood normal.        Behavior: Behavior normal.     ED Results / Procedures / Treatments   Labs (all labs ordered are listed, but only abnormal results are displayed) Labs Reviewed  BASIC METABOLIC PANEL - Abnormal; Notable for the following components:      Result Value   Glucose, Bld 103 (*)    All other components within normal limits  CBC - Abnormal; Notable for the following components:   WBC 14.2 (*)    RDW 16.0 (*)    All other components within normal limits  HEPATIC FUNCTION PANEL  PROTIME-INR    EKG None  Radiology US Venous Img Lower Unilateral Right  Result Date: 07/11/2020 CLINICAL DATA:  History of clotting disorder, thrombophlebitis and DVT, worsening pain and redness in RIGHT calf, cord-like tenderness in RIGHT calf, negative ultrasound 3 days ago but with new physical findings, question deep venous thrombosis. EXAM: RIGHT LOWER EXTREMITY VENOUS DOPPLER ULTRASOUND TECHNIQUE: Gray-scale sonography with compression, as well as color and duplex ultrasound, were performed to evaluate the deep venous system(s) from the level of the common femoral vein through the popliteal and proximal calf veins. COMPARISON:  None. FINDINGS: VENOUS Normal compressibility of common femoral, superficial femoral, profundus femoral and popliteal veins. Spontaneous venous flow is present within these vessels with intact augmentation. No internal thrombus identified. RIGHT posterior tibial vein appears patent. RIGHT peroneal vein is less well demonstrated due to depth but appears distended by hypoechoic thrombus and is noncompressible, consistent with deep venous  thrombosis. In addition, thrombus is identified  within a superficial vein in the RIGHT calf, somewhat tortuous, question venous varicosity, vessel demonstrating noncompressibility and corresponding to site of symptoms. Limited views of the contralateral common femoral vein are unremarkable. OTHER None. Limitations: none IMPRESSION: Acute appearing deep venous thrombosis involving the RIGHT peroneal vein Superficial thrombophlebitis of a RIGHT calf vein question a venous varicosity, corresponding to site of symptoms. Electronically Signed   By: Ulyses SouthwardMark  Boles M.D.   On: 07/11/2020 21:12    Procedures Procedures   Medications Ordered in ED Medications  oxyCODONE-acetaminophen (PERCOCET/ROXICET) 5-325 MG per tablet 1 tablet (1 tablet Oral Given 07/11/20 2013)  warfarin (COUMADIN) tablet 5 mg (5 mg Oral Given 07/11/20 2223)    ED Course  I have reviewed the triage vital signs and the nursing notes.  Pertinent labs & imaging results that were available during my care of the patient were reviewed by me and considered in my medical decision making (see chart for details).  DDx includes superficial thrombophlebitis (most likely as these knots are superficial and easy to palpate) vs interval clot development.  Will need repeat DVT ultrasound given her incredibly high risk for clotting.  This doesn't look clinically like an infection to me - but she can complete her bactrim if her prior site had appeared infected to the last EDP.  Her right upper thigh looks to be resolving - swelling and pain improved here.  She is compliant with all meds at home. At this time there are no signs or symptoms of PE.  Clinical Course as of 07/11/20 2226  Fri Jul 11, 2020  2135 IMPRESSION: Acute appearing deep venous thrombosis involving the RIGHT peroneal vein  Superficial thrombophlebitis of a RIGHT calf vein question a venous varicosity, corresponding to site of symptoms. [MT]  2144 I spoke to Dr Pamelia HoitGudena her hematologist regarding the new clot.  He reports that the  next step would be initiating coumadin.  He advised starting her on 5 mg of warfarin tonight, and having her discontinue xarelto beginning tomorrow.  Dr Pamelia HoitGudena reports that since she is already on and compliant with xarelto, she does not need a bridge to coumadin.  Dr Pamelia HoitGudena felt this could be managed medically as an outpatient, and there would be no other therapeutic benefit to hospitalization at this time.  I discussed this with the patient and her husband.  We engaged in shared medical decision making.  We discussed observation in the hospital, but she does not want to stay.  I think this would be reasonable as there are no signs or symptoms of PE at this time - and in consideration of the financial cost of an observation admission.  However I reviewed explicitly with her the return precautions for PE and spontaneously bleeding.  I also will provide information about dietary recommendations regarding coumadin. [MT]  2224 Labs reviewed - unremarkable.  Minor leukocytosis appears chronic.  INR normal.  LFT normal.   [MT]    Clinical Course User Index [MT] Terald Sleeperrifan, Chaya Dehaan J, MD    Final Clinical Impression(s) / ED Diagnoses Final diagnoses:  Acute deep vein thrombosis (DVT) of right peroneal vein (HCC)  Thrombophlebitis of superficial veins of right lower extremity    Rx / DC Orders ED Discharge Orders         Ordered    warfarin (COUMADIN) 5 MG tablet  Daily        07/11/20 2216  Terald Sleeper, MD 07/11/20 2226

## 2020-07-11 NOTE — ED Notes (Signed)
Radiology at Bedside. °

## 2020-07-13 ENCOUNTER — Other Ambulatory Visit: Payer: Self-pay

## 2020-07-13 ENCOUNTER — Emergency Department (HOSPITAL_BASED_OUTPATIENT_CLINIC_OR_DEPARTMENT_OTHER)
Admission: EM | Admit: 2020-07-13 | Discharge: 2020-07-14 | Disposition: A | Discharge status: Home / Self Care | Payer: Federal, State, Local not specified - PPO | Attending: Emergency Medicine | Admitting: Emergency Medicine

## 2020-07-13 ENCOUNTER — Emergency Department (HOSPITAL_BASED_OUTPATIENT_CLINIC_OR_DEPARTMENT_OTHER): Payer: Federal, State, Local not specified - PPO

## 2020-07-13 ENCOUNTER — Encounter (HOSPITAL_BASED_OUTPATIENT_CLINIC_OR_DEPARTMENT_OTHER): Payer: Self-pay | Admitting: Obstetrics and Gynecology

## 2020-07-13 DIAGNOSIS — D688 Other specified coagulation defects: Secondary | ICD-10-CM | POA: Diagnosis not present

## 2020-07-13 DIAGNOSIS — I2510 Atherosclerotic heart disease of native coronary artery without angina pectoris: Secondary | ICD-10-CM | POA: Insufficient documentation

## 2020-07-13 DIAGNOSIS — R079 Chest pain, unspecified: Secondary | ICD-10-CM | POA: Diagnosis not present

## 2020-07-13 DIAGNOSIS — F17210 Nicotine dependence, cigarettes, uncomplicated: Secondary | ICD-10-CM | POA: Diagnosis not present

## 2020-07-13 DIAGNOSIS — I110 Hypertensive heart disease with heart failure: Secondary | ICD-10-CM | POA: Insufficient documentation

## 2020-07-13 DIAGNOSIS — I82401 Acute embolism and thrombosis of unspecified deep veins of right lower extremity: Secondary | ICD-10-CM | POA: Diagnosis not present

## 2020-07-13 DIAGNOSIS — Z7901 Long term (current) use of anticoagulants: Secondary | ICD-10-CM | POA: Insufficient documentation

## 2020-07-13 DIAGNOSIS — Z79899 Other long term (current) drug therapy: Secondary | ICD-10-CM | POA: Diagnosis not present

## 2020-07-13 DIAGNOSIS — M79604 Pain in right leg: Secondary | ICD-10-CM | POA: Diagnosis not present

## 2020-07-13 DIAGNOSIS — I82451 Acute embolism and thrombosis of right peroneal vein: Secondary | ICD-10-CM | POA: Diagnosis not present

## 2020-07-13 DIAGNOSIS — I824Y1 Acute embolism and thrombosis of unspecified deep veins of right proximal lower extremity: Secondary | ICD-10-CM

## 2020-07-13 DIAGNOSIS — I5022 Chronic systolic (congestive) heart failure: Secondary | ICD-10-CM | POA: Insufficient documentation

## 2020-07-13 DIAGNOSIS — R0602 Shortness of breath: Secondary | ICD-10-CM | POA: Diagnosis not present

## 2020-07-13 DIAGNOSIS — R791 Abnormal coagulation profile: Secondary | ICD-10-CM | POA: Diagnosis not present

## 2020-07-13 LAB — CBC WITH DIFFERENTIAL/PLATELET
Abs Immature Granulocytes: 0.07 10*3/uL (ref 0.00–0.07)
Basophils Absolute: 0.1 10*3/uL (ref 0.0–0.1)
Basophils Relative: 1 %
Eosinophils Absolute: 0.3 10*3/uL (ref 0.0–0.5)
Eosinophils Relative: 2 %
HCT: 39.1 % (ref 36.0–46.0)
Hemoglobin: 13.8 g/dL (ref 12.0–15.0)
Immature Granulocytes: 1 %
Lymphocytes Relative: 31 %
Lymphs Abs: 4.3 10*3/uL — ABNORMAL HIGH (ref 0.7–4.0)
MCH: 28 pg (ref 26.0–34.0)
MCHC: 35.3 g/dL (ref 30.0–36.0)
MCV: 79.5 fL — ABNORMAL LOW (ref 80.0–100.0)
Monocytes Absolute: 1 10*3/uL (ref 0.1–1.0)
Monocytes Relative: 7 %
Neutro Abs: 8.2 10*3/uL — ABNORMAL HIGH (ref 1.7–7.7)
Neutrophils Relative %: 58 %
Platelets: 393 10*3/uL (ref 150–400)
RBC: 4.92 MIL/uL (ref 3.87–5.11)
RDW: 15.7 % — ABNORMAL HIGH (ref 11.5–15.5)
WBC: 14.1 10*3/uL — ABNORMAL HIGH (ref 4.0–10.5)
nRBC: 0 % (ref 0.0–0.2)

## 2020-07-13 LAB — BASIC METABOLIC PANEL
Anion gap: 11 (ref 5–15)
BUN: 11 mg/dL (ref 6–20)
CO2: 21 mmol/L — ABNORMAL LOW (ref 22–32)
Calcium: 9.3 mg/dL (ref 8.9–10.3)
Chloride: 106 mmol/L (ref 98–111)
Creatinine, Ser: 1.07 mg/dL — ABNORMAL HIGH (ref 0.44–1.00)
GFR, Estimated: >60 mL/min (ref >60)
Glucose, Bld: 101 mg/dL — ABNORMAL HIGH (ref 70–99)
Potassium: 3.8 mmol/L (ref 3.5–5.1)
Sodium: 138 mmol/L (ref 135–145)

## 2020-07-13 LAB — PROTIME-INR
INR: 1 (ref 0.8–1.2)
Prothrombin Time: 12.8 seconds (ref 11.4–15.2)

## 2020-07-13 LAB — TROPONIN I (HIGH SENSITIVITY): Troponin I (High Sensitivity): 4 ng/L (ref <18)

## 2020-07-13 MED ORDER — FENTANYL CITRATE (PF) 100 MCG/2ML IJ SOLN
50.0000 ug | Freq: Once | INTRAMUSCULAR | Status: AC
Start: 2020-07-13 — End: 2020-07-13
  Administered 2020-07-13: 50 ug via INTRAVENOUS
  Filled 2020-07-13: qty 2

## 2020-07-13 MED ORDER — IOHEXOL 350 MG/ML SOLN
100.0000 mL | Freq: Once | INTRAVENOUS | Status: AC | PRN
  Administered 2020-07-13: 100 mL via INTRAVENOUS

## 2020-07-13 MED ORDER — ENOXAPARIN SODIUM 150 MG/ML ~~LOC~~ SOLN: 170.0000 mg | Freq: Two times a day (BID) | SUBCUTANEOUS | 0 refills | Status: DC

## 2020-07-13 MED ORDER — ENOXAPARIN SODIUM 300 MG/3ML IJ SOLN
20.0000 mg | Freq: Once | INTRAMUSCULAR | Status: AC
  Administered 2020-07-13: 20 mg via SUBCUTANEOUS
  Filled 2020-07-13: qty 0.2

## 2020-07-13 MED ORDER — ENOXAPARIN SODIUM 300 MG/3ML IJ SOLN: 1.0000 mg/kg | Freq: Once | INTRAMUSCULAR | Status: DC

## 2020-07-13 MED ORDER — ENOXAPARIN SODIUM 150 MG/ML ~~LOC~~ SOLN
150.0000 mg | Freq: Once | SUBCUTANEOUS | Status: AC
  Administered 2020-07-13: 150 mg via SUBCUTANEOUS
  Filled 2020-07-13: qty 1

## 2020-07-13 MED ORDER — WARFARIN SODIUM 5 MG PO TABS
10.0000 mg | ORAL_TABLET | Freq: Once | ORAL | Status: AC
  Administered 2020-07-13: 10 mg via ORAL
  Filled 2020-07-13: qty 2

## 2020-07-13 NOTE — Discharge Instructions (Addendum)
Please call Dr. Sherie Don office on Monday morning to get an appointment for monitoring of your Coumadin level.  If you have any chest pain, difficulty in breathing, or worsening leg pain and swelling, return to ER for reassessment.  Please start using the Lovenox until you are told to stop when your Coumadin levels are adequate.  Likely you will need to be on this medicine for at least the next few days to week or so.  Continue your previously prescribed Coumadin dose until further instructed by Gudena's office.

## 2020-07-13 NOTE — ED Triage Notes (Signed)
Patient reports to the ER for SOB. Patient reports she has a clotting disorder and has multiple clots in various areas of her lungs, brain, and one DVT in her leg. Patient reports worsening pain and SOB.

## 2020-07-13 NOTE — ED Provider Notes (Signed)
[facility]CENTER [facility] EMERGENCY DEPT Provider Note   CSN: [id number] Arrival date & time: 07/13/20  2043     History Chief Complaint  Patient presents with  . Shortness of Breath    Angelica Brown is a 45 y.o. female. obesity, clotting disorder, MI, HTN, DVT, on xarelto, presenting to ED with concern for worsening right leg pain as well as new onset shortness of breath.  On 3/25, ultrasound showed DVT.  Case was reviewed with her hematologist who recommended starting on warfarin and stopping Xarelto.  Patient states that she has done this.  However today she noted increasing pain and swelling around her right lower leg.  Also states that she felt more winded with minimal exertion, walking to mailbox.  No chest pain.  No shortness of breath at rest at present.  No numbness or weakness.  HPI     Past Medical History:  Diagnosis Date  . ACS (acute coronary syndrome) (HCC) 05/04/2018  . CHOLELITHIASIS   . Chronic systolic CHF (congestive heart failure) (HCC) 06/13/2016   Ischemic CM (inf MI 05/2018) >> Echo 05/04/2018 - Mod LVH, EF 40-45, inf-lat HK, PASP 47 (mild pul HTN)  . Coronary artery disease   . Dermatophytosis of nail   . 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 GERD (gastroesophageal reflux disease)   . Gestational diabetes    "w/all 4 pregnancies"  . Heart murmur   . Hypertension   . Metrorrhagia   . Migraine    "at least twice/month" (05/28/2016)  . Nystagmus 06/30/2016  . OBESITY, MORBID   . OBESITY, NOS   . Pneumonia 11/2014  . POSTURAL LIGHTHEADEDNESS   . Sickle cell trait (HCC)   . STEMI (ST elevation myocardial infarction) (HCC) 11/09/2017   in [place], [place] - see records in Care Everywhere  . SYNCOPE   . Throat pain   . TOBACCO DEPENDENCE   . URI   . Urinary frequency     Patient Active Problem List   Diagnosis Date Noted  . Hypertension 05/27/2020  .  Lateral epicondylitis 04/30/2020  . Prediabetes 11/27/2019  . Otitis externa 11/07/2019  . Pelvic pain 08/06/2019  . OSA (obstructive sleep apnea) 08/06/2019  . Left leg pain 03/22/2019  . Acute left-sided low back pain without sciatica 11/21/2018  . Left flank pain 11/14/2018  . Hx of Inf MI (7/19 Tx with BMS to RCA; stent thrombosis 9/19 tx with DES to RCA; stent thrombosis 04/2018 tx with thrombectomy) 05/04/2018  . Bradycardia 04/20/2018  . History of MI (myocardial infarction) 04/20/2018  . 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 Seborrheic dermatitis 10/27/2016  . Nystagmus 06/30/2016  . Chronic systolic CHF (congestive heart failure) (HCC) 06/13/2016  . Acute stress reaction 06/09/2016  . Mastoiditis of right side 05/29/2016  . 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 Papilledema of both eyes   . Bacterial meningitis   . Headache 05/26/2016  . Onychomycosis 04/16/2013  . Family history of breast cancer 01/17/2013  . Sickle cell trait (HCC)   . Dizziness 08/29/2008  . OBESITY, MORBID 07/10/2008  . SYNCOPE 10/24/2006  . TOBACCO DEPENDENCE 06/16/2006  . METRORRHAGIA 06/16/2006    Past Surgical History:  Procedure Laterality Date  . BARTHOLIN GLAND CYST EXCISION Bilateral 2004  . CORONARY THROMBECTOMY N/A 05/04/2018   Procedure: Coronary Thrombectomy;  Surgeon: Lyn Records, MD;  Location: [facility] INVASIVE CV LAB;  Service: Cardiovascular;  Laterality: N/A;  .  EXCISIONAL HEMORRHOIDECTOMY  2006   "removed a blood clot from one"  . LEFT HEART CATH AND CORONARY ANGIOGRAPHY N/A 05/04/2018   Procedure: LEFT HEART CATH AND CORONARY ANGIOGRAPHY;  Surgeon: Lyn Records, MD;  Location: [facility] INVASIVE CV LAB;  Service: Cardiovascular;  Laterality: N/A;  . TUBAL LIGATION  2007  . WISDOM TOOTH EXTRACTION  1998     OB History    Gravida  5   Para  5   Term  4   Preterm  1   AB      Living  4     SAB      IAB      Ectopic      Multiple  1   Live  Births              Family History  Problem Relation Age of Onset  . Breast cancer Mother   . Cancer Father        MELANOMA  . Diabetes Father   . Diabetes Paternal Grandmother   . Diabetes Paternal Grandfather   . Breast cancer Maternal Aunt   . Breast cancer Paternal Aunt     Social History   Tobacco Use  . Smoking status: Current Every Day Smoker    Packs/day: 0.15    Years: 18.00    Pack years: 2.70    Types: Cigarettes  . Smokeless tobacco: Never Used  Vaping Use  . Vaping Use: Never used  Substance Use Topics  . Alcohol use: No  . Drug use: No    Home Medications Prior to Admission medications   Medication Sig Start Date End Date Taking? Authorizing Provider  enoxaparin (LOVENOX) 150 MG/ML injection Inject 1.14 mLs (170 mg total) into the skin every 12 (twelve) hours. 07/13/20  Yes Milagros Loll, MD  acetaminophen (TYLENOL) 500 MG tablet Take 500-1,000 mg by mouth every 6 (six) hours as needed for mild pain or headache.    [provider]  cetirizine (ZYRTEC) 10 MG tablet Take 1 tablet (10 mg total) by mouth daily as needed for allergies. 10/29/19   Meccariello, Solmon Ice, DO  diclofenac Sodium (VOLTAREN) 1 % GEL Apply 4 g topically 4 (four) times daily. 04/30/20   Simmons-Robinson, Tawanna Cooler, MD  HYDROcodone-acetaminophen (NORCO/VICODIN) 5-325 MG tablet Take 1 tablet by mouth every 6 (six) hours as needed for severe pain. 06/12/20   Latrelle Dodrill, MD  lisinopril (ZESTRIL) 10 MG tablet Take 10 mg by mouth daily. Correct dose unknown    [provider]  metoprolol tartrate (LOPRESSOR) 25 MG tablet TAKE 1/2 (ONE-HALF) TABLET BY MOUTH ONCE DAILY WITH BREAKFAST 01/18/20   Latrelle Dodrill, MD  nitroGLYCERIN (NITROSTAT) 0.4 MG SL tablet Place 1 tablet (0.4 mg total) under the tongue every 5 (five) minutes x 3 doses as needed for chest pain. 05/27/20   Latrelle Dodrill, MD  omeprazole (PRILOSEC) 40 MG capsule Take 40 mg by mouth 2 (two) times  daily as needed (for GERD-like symptoms).     [provider]  prasugrel (EFFIENT) 10 MG TABS tablet Take 1 tablet by mouth once daily 01/16/20   Latrelle Dodrill, MD  rosuvastatin (CRESTOR) 40 MG tablet Take 1 tablet by mouth once daily 07/11/20   Carney Living, MD  sulfamethoxazole-trimethoprim (BACTRIM DS) 800-160 MG tablet Take 1 tablet by mouth 2 (two) times daily for 7 days. 07/08/20 07/15/20  Jacalyn Lefevre, MD  topiramate (TOPAMAX) 50 MG tablet Take 1 tablet by mouth  once daily 07/11/20   Carney Living, MD  warfarin (COUMADIN) 5 MG tablet Take 1 tablet (5 mg total) by mouth daily. 07/12/20 08/11/20  Terald Sleeper, MD  XARELTO 20 MG TABS tablet Take 1 tablet by mouth once daily 07/09/20 07/11/20  Serena Croissant, MD    Allergies    Ampicillin, Penicillins, Strawberry extract, Cephalexin, and Prednisone  Review of Systems   Review of Systems  Constitutional: Negative for chills and fever.  HENT: Negative for ear pain and sore throat.   Eyes: Negative for pain and visual disturbance.  Respiratory: Positive for shortness of breath. Negative for cough.   Cardiovascular: Negative for chest pain and palpitations.  Gastrointestinal: Negative for abdominal pain and vomiting.  Genitourinary: Negative for dysuria and hematuria.  Musculoskeletal: Positive for arthralgias. Negative for back pain.  Skin: Negative for color change and rash.  Neurological: Negative for seizures and syncope.  All other systems reviewed and are negative.   Physical Exam Updated Vital Signs BP 96/65 (BP Location: Left Arm)   Pulse 67   Temp 98.3 F (36.8 C) (Oral)   Resp 18   Ht 5\' 9"  (1.753 m)   Wt (!) 164.2 kg   LMP 02/13/2020 (Approximate)   SpO2 98%   BMI 53.46 kg/m   Physical Exam Vitals and nursing note reviewed.  Constitutional:      General: She is not in acute distress.    Appearance: She is well-developed.  HENT:     Head: Normocephalic and atraumatic.  Eyes:      Conjunctiva/sclera: Conjunctivae normal.  Cardiovascular:     Rate and Rhythm: Normal rate and regular rhythm.     Heart sounds: No murmur heard.   Pulmonary:     Effort: Pulmonary effort is normal. No respiratory distress.     Breath sounds: Normal breath sounds.  Abdominal:     Palpations: Abdomen is soft.     Tenderness: There is no abdominal tenderness.  Musculoskeletal:     Cervical back: Neck supple.     Comments: Right lower extremity: There is mild swelling and slight erythema over the posterior calf as well as some associated tenderness to palpation, peripheral pulses and sensation intact  Skin:    General: Skin is warm and dry.  Neurological:     Mental Status: She is alert.     ED Results / Procedures / Treatments   Labs (all labs ordered are listed, but only abnormal results are displayed) Labs Reviewed  CBC WITH DIFFERENTIAL/PLATELET - Abnormal; Notable for the following components:      Result Value   WBC 14.1 (*)    MCV 79.5 (*)    RDW 15.7 (*)    Neutro Abs 8.2 (*)    Lymphs Abs 4.3 (*)    All other components within normal limits  BASIC METABOLIC PANEL - Abnormal; Notable for the following components:   CO2 21 (*)    Glucose, Bld 101 (*)    Creatinine, Ser 1.07 (*)    All other components within normal limits  PROTIME-INR  HCG, SERUM, QUALITATIVE  TROPONIN I (HIGH SENSITIVITY)    EKG None  Radiology CT Angio Chest PE W and/or Wo Contrast  Result Date: 07/13/2020 CLINICAL DATA:  Pain and shortness of breath. Patient reports history of clotting disorder. EXAM: CT ANGIOGRAPHY CHEST WITH CONTRAST TECHNIQUE: Multidetector CT imaging of the chest was performed using the standard protocol during bolus administration of intravenous contrast. Multiplanar CT image reconstructions and MIPs were  obtained to evaluate the vascular anatomy. CONTRAST:  OMNIPAQUE IOHEXOL 350 MG/ML SOLN COMPARISON:  Most recent exam radiograph 05/04/2018. Chest CTA 03/10/2018  FINDINGS: Cardiovascular: There are no filling defects within the pulmonary arteries to suggest pulmonary embolus. Evaluation is diagnostic to the proximal segmental level. Distal segmental and subsegmental branches are not well assessed due to breathing motion artifact and soft tissue attenuation from habitus. Borderline dilatation of the main pulmonary artery at 3.3 cm. No aortic aneurysm. Mild cardiomegaly. Coronary stent visualized. No pericardial effusion. Mediastinum/Nodes: Small prevascular node is unchanged from prior exam. No hilar adenopathy. No esophageal wall thickening. Distal esophagus mildly patulous. No visualized thyroid nodule. Lungs/Pleura: No focal airspace disease, pleural effusion, or findings of pulmonary edema. No pulmonary mass. Upper Abdomen: No acute or unexpected findings. Musculoskeletal: There are no acute or suspicious osseous abnormalities. Review of the MIP images confirms the above findings. IMPRESSION: 1. No pulmonary embolus or acute intrathoracic abnormality. 2. Cardiomegaly. Borderline dilatation of the main pulmonary artery, can be seen with pulmonary arterial hypertension. Electronically Signed   By: Narda Rutherford M.D.   On: 07/13/2020 22:23   US Venous Img Lower Right (DVT Study)  Result Date: 07/13/2020 CLINICAL DATA:  Right calf pain EXAM: RIGHT LOWER EXTREMITY VENOUS DOPPLER ULTRASOUND TECHNIQUE: Gray-scale sonography with graded compression, as well as color Doppler and duplex ultrasound were performed to evaluate the lower extremity deep venous systems from the level of the common femoral vein and including the common femoral, femoral, profunda femoral, popliteal and calf veins including the posterior tibial, peroneal and gastrocnemius veins when visible. The superficial great saphenous vein was also interrogated. Spectral Doppler was utilized to evaluate flow at rest and with distal augmentation maneuvers in the common femoral, femoral and popliteal veins.  COMPARISON:  07/12/2019 FINDINGS: Contralateral Common Femoral Vein: Respiratory phasicity is normal and symmetric with the symptomatic side. No evidence of thrombus. Normal compressibility. Common Femoral Vein: No evidence of thrombus. Normal compressibility, respiratory phasicity and response to augmentation. Saphenofemoral Junction: No evidence of thrombus. Normal compressibility and flow on color Doppler imaging. Profunda Femoral Vein: No evidence of thrombus. Normal compressibility and flow on color Doppler imaging. Femoral Vein: No evidence of thrombus. Normal compressibility, respiratory phasicity and response to augmentation. Popliteal Vein: No evidence of thrombus. Normal compressibility, respiratory phasicity and response to augmentation. Calf Veins: Persistent peroneal thrombus is noted stable in appearance from the prior exam. Superficial Great Saphenous Vein: Superficial thrombophlebitis is noted within branches of the greater saphenous vein. Venous Reflux:  None. Other Findings:  None. IMPRESSION: Stable appearance of right peroneal vein thrombosis and superficial thrombophlebitis. Electronically Signed   By: Alcide Clever M.D.   On: 07/13/2020 22:17    Procedures Procedures   Medications Ordered in ED Medications  warfarin (COUMADIN) tablet 10 mg (has no administration in time range)  enoxaparin (LOVENOX) injection 150 mg (has no administration in time range)    And  enoxaparin (LOVENOX) 100 mg/mL injection 20 mg (has no administration in time range)  iohexol (OMNIPAQUE) 350 MG/ML injection 100 mL (100 mLs Intravenous Contrast Given 07/13/20 2203)  fentaNYL (SUBLIMAZE) injection 50 mcg (50 mcg Intravenous Given 07/13/20 2230)    ED Course  I have reviewed the triage vital signs and the nursing notes.  Pertinent labs & imaging results that were available during my care of the patient were reviewed by me and considered in my medical decision making (see chart for details).    MDM  Rules/Calculators/A&P  45 year old lady with history of coronary disease, antiphospholipid antibody syndrome, lupus anticoagulant presenting to ER with concern for episode of shortness of breath as well as worsening right leg pain.  Recently switched from Xarelto to Coumadin due to newly diagnosed DVT while reportedly compliant with Xarelto therapy.  On exam today, patient has some mild swelling and tenderness over her right calf.  Otherwise was well-appearing with stable vitals.  Repeat ultrasound showed stable appearance of the right perineal DVT and superficial thrombophlebitis.  CTA chest negative for pulmonary embolism.  EKG without acute ischemic change and first troponin is within normal limits.  Notably INR is 1.0.  Reviewed case with our pharmacist - Abran DukeLedford, James -he recommends bridging patient with Lovenox until Coumadin is therapeutic.  Recommended providing extra dose of Coumadin for a total of 10 mg this evening and then resuming 5 mg and deferring further Coumadin changes to her hematologist.  Recommended Lovenox 1 mg/kg twice daily subcu until INR therapeutic.  Discussed these recommendations with patient who demonstrates understanding.    Given her cardiac history, will check second troponin.  If repeat troponin is within normal limits, anticipate discharge home.  Dr. Eudelia Bunchardama will follow up on this.  Final Clinical Impression(s) / ED Diagnoses Final diagnoses:  Acute deep vein thrombosis (DVT) of proximal vein of right lower extremity (HCC)  Subtherapeutic international normalized ratio (INR)    Rx / DC Orders ED Discharge Orders         Ordered    enoxaparin (LOVENOX) 150 MG/ML injection  Every 12 hours        07/13/20 2239           Milagros Lollykstra, Rashidi Loh S, MD 07/13/20 2254

## 2020-07-14 ENCOUNTER — Other Ambulatory Visit: Payer: Self-pay | Admitting: Hematology and Oncology

## 2020-07-14 ENCOUNTER — Other Ambulatory Visit: Payer: Self-pay | Admitting: *Deleted

## 2020-07-14 ENCOUNTER — Telehealth: Payer: Self-pay | Admitting: Hematology and Oncology

## 2020-07-14 DIAGNOSIS — I82621 Acute embolism and thrombosis of deep veins of right upper extremity: Secondary | ICD-10-CM

## 2020-07-14 LAB — TROPONIN I (HIGH SENSITIVITY): Troponin I (High Sensitivity): 3 ng/L (ref <18)

## 2020-07-14 NOTE — ED Provider Notes (Signed)
I assumed care of this patient.  Please see previous provider note for further details of Hx, PE.  Briefly patient is a 45 y.o. female who presented SOB. Needs transitioning to coumadin. Currently pending delta trop.   Trop negative.  The patient appears reasonably screened and/or stabilized for discharge and I doubt any other medical condition or other [facility] requiring further screening, evaluation, or treatment in the ED at this time prior to discharge. Safe for discharge with strict return precautions.  Disposition: Discharge  Condition: Good  I have discussed the results, Dx and Tx plan with the patient/family who expressed understanding and agree(s) with the plan. Discharge instructions discussed at length. The patient/family was given strict return precautions who verbalized understanding of the instructions. No further questions at time of discharge.    ED Discharge Orders         Ordered    enoxaparin (LOVENOX) 150 MG/ML injection  Every 12 hours        07/13/20 2239            Follow Up: Serena Croissant, MD [address] [place] [address] [phone]     Latrelle Dodrill, MD [address] [place] [address] [phone]          Nira Conn, MD 07/14/20 [phone]

## 2020-07-14 NOTE — Telephone Encounter (Signed)
Scheduled appts per 3/28 sch msg. Called pt, no answer. Left msg with appts date and times.

## 2020-07-15 ENCOUNTER — Telehealth (HOSPITAL_BASED_OUTPATIENT_CLINIC_OR_DEPARTMENT_OTHER): Payer: Self-pay

## 2020-07-15 DIAGNOSIS — I82409 Acute embolism and thrombosis of unspecified deep veins of unspecified lower extremity: Secondary | ICD-10-CM | POA: Insufficient documentation

## 2020-07-15 NOTE — Progress Notes (Incomplete)
Patient Care Team: Latrelle Dodrill, MD as PCP - General (Pediatrics) Lyn Records, MD as PCP - Cardiology (Cardiology)  DIAGNOSIS: No diagnosis found.  CHIEF COMPLIANT: Follow-up of dural venous sinus thrombosis on Eliquis  INTERVAL HISTORY: Angelica Brown is a 45 y.o. 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 She presents to the clinic today for follow-up.   ALLERGIES:  is allergic to ampicillin, penicillins, strawberry extract, cephalexin, and prednisone.  MEDICATIONS:  Current Outpatient Medications  Medication Sig Dispense Refill  . acetaminophen (TYLENOL) 500 MG tablet Take 500-1,000 mg by mouth every 6 (six) hours as needed for mild pain or headache.    . cetirizine (ZYRTEC) 10 MG tablet Take 1 tablet (10 mg total) by mouth daily as needed for allergies. 30 tablet 11  . diclofenac Sodium (VOLTAREN) 1 % GEL Apply 4 g topically 4 (four) times daily. 150 g 1  . enoxaparin (LOVENOX) 150 MG/ML injection Inject 1.14 mLs (170 mg total) into the skin every 12 (twelve) hours. 25 mL 0  . HYDROcodone-acetaminophen (NORCO/VICODIN) 5-325 MG tablet Take 1 tablet by mouth every 6 (six) hours as needed for severe pain. 10 tablet 0  . lisinopril (ZESTRIL) 10 MG tablet Take 10 mg by mouth daily. Correct dose unknown    . metoprolol tartrate (LOPRESSOR) 25 MG tablet TAKE 1/2 (ONE-HALF) TABLET BY MOUTH ONCE DAILY WITH BREAKFAST 45 tablet 3  . nitroGLYCERIN (NITROSTAT) 0.4 MG SL tablet Place 1 tablet (0.4 mg total) under the tongue every 5 (five) minutes x 3 doses as needed for chest pain. 25 tablet 12  . omeprazole (PRILOSEC) 40 MG capsule Take 40 mg by mouth 2 (two) times daily as needed (for GERD-like symptoms).     . prasugrel (EFFIENT) 10 MG TABS tablet Take 1 tablet by mouth once daily 90 tablet 3  . rosuvastatin (CRESTOR) 40 MG tablet  Take 1 tablet by mouth once daily 90 tablet 0  . sulfamethoxazole-trimethoprim (BACTRIM DS) 800-160 MG tablet Take 1 tablet by mouth 2 (two) times daily for 7 days. 14 tablet 0  . topiramate (TOPAMAX) 50 MG tablet Take 1 tablet by mouth once daily 90 tablet 0  . warfarin (COUMADIN) 5 MG tablet Take 1 tablet (5 mg total) by mouth daily. 30 tablet 1   No current facility-administered medications for this visit.    PHYSICAL EXAMINATION: ECOG PERFORMANCE STATUS: {CHL ONC ECOG PS:[id number]}  There were no vitals filed for this visit. There were no vitals filed for this visit.  LABORATORY DATA:  I have reviewed the data as listed CMP Latest Ref Rng & Units 07/13/2020 07/11/2020 05/27/2020  Glucose 70 - 99 mg/dL 07/25/2020) 027(O) 89  BUN 6 - 20 mg/dL 11 10 7   Creatinine 0.44 - 1.00 mg/dL 536(U) 4.40(H  Sodium 135 - 145 mmol/L 138 138 141  Potassium 3.5 - 5.1 mmol/L 3.8 3.5 3.9  Chloride 98 - 111 mmol/L 106 105 103  CO2 22 - 32 mmol/L 21(L) 23 24  Calcium 8.9 - 10.3 mg/dL 9.3 9.7 9.4  Total Protein 6.5 - 8.1 g/dL - 7.5 7.1  Total Bilirubin 0.3 - 1.2 mg/dL - 0.5 0.4  Alkaline Phos 38 - 126 U/L - 65 87  AST 15 - 41 U/L - 15 13  ALT 0 - 44 U/L - 15 16    Lab Results  Component Value Date  WBC 14.1 (H) 07/13/2020   HGB 13.8 07/13/2020   HCT 39.1 07/13/2020   MCV 79.5 (L) 07/13/2020   PLT 393 07/13/2020   NEUTROABS 8.2 (H) 07/13/2020    ASSESSMENT & PLAN:  No problem-specific Assessment & Plan notes found for this encounter.    No orders of the defined types were placed in this encounter.  The patient has a good understanding of the overall plan. she agrees with it. she will call with any problems that may develop before the next visit here.  Total time spent: *** mins including face to face time and time spent for planning, charting and coordination of care  Sabas Sous, MD, MPH 07/15/2020  I, Kirt Boys Dorshimer, am acting as scribe for Dr. Serena Croissant.  {insert scribe  attestation}

## 2020-07-15 NOTE — Assessment & Plan Note (Deleted)
07/13/20: Rt leg pain U/S revealed DVT (while on Eliquis and Aspirin: APL Ab syndrome) H/O dural venous sinus thrombosis Current Treatment: Coumadin Goal INR: 2-3  RTC weekly for INR checks

## 2020-07-16 ENCOUNTER — Ambulatory Visit: Payer: Federal, State, Local not specified - PPO | Admitting: Hematology and Oncology

## 2020-07-16 ENCOUNTER — Other Ambulatory Visit: Payer: Federal, State, Local not specified - PPO

## 2020-07-16 DIAGNOSIS — I82469 Acute embolism and thrombosis of unspecified calf muscular vein: Secondary | ICD-10-CM

## 2020-07-16 NOTE — Progress Notes (Signed)
Patient Care Team: Latrelle Dodrill, MD as PCP - General (Pediatrics) Lyn Records, MD as PCP - Cardiology (Cardiology)  DIAGNOSIS:    ICD-10-CM   1. Acute deep vein thrombosis (DVT) of calf muscle vein, unspecified laterality (HCC)  I82.469     CHIEF COMPLIANT: Follow-up of dural venous sinus thrombosis and recent diagnosis of superficial thrombophlebitis  INTERVAL HISTORY: Angelica Brown is a 45 y.o. 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 on 07/08/20 showed a probable superficial thrombophlebitis involving superficial varicosity in right upper thigh. CTA on 07/13/20 showed no evidence of pulmonary embolus. She presents to the clinic today for follow-up. She had a promotion at her job and travel to 07/15/20 in a car and came back.  Subsequently she noticed cordlike thickening in the calf which was investigated by several ultrasounds and was found to have superficial venous thrombosis.  This happened while she was on Xarelto.  Therefore Xarelto was discontinued and she was placed on Coumadin.  She went back to the emergency room and they increased the Coumadin and added Lovenox injections.    ALLERGIES:  is allergic to ampicillin, penicillins, strawberry extract, cephalexin, and prednisone.  MEDICATIONS:  Current Outpatient Medications  Medication Sig Dispense Refill  . acetaminophen (TYLENOL) 500 MG tablet Take 500-1,000 mg by mouth every 6 (six) hours as needed for mild pain or headache.    . cetirizine (ZYRTEC) 10 MG tablet Take 1 tablet (10 mg total) by mouth daily as needed for allergies. 30 tablet 11  . diclofenac Sodium (VOLTAREN) 1 % GEL Apply 4 g topically 4 (four) times daily. 150 g 1  . enoxaparin (LOVENOX) 150 MG/ML injection Inject 1.14 mLs (170 mg total) into the skin every 12 (twelve) hours. 25 mL 0  .  HYDROcodone-acetaminophen (NORCO/VICODIN) 5-325 MG tablet Take 1 tablet by mouth every 6 (six) hours as needed for severe pain. 10 tablet 0  . lisinopril (ZESTRIL) 10 MG tablet Take 10 mg by mouth daily. Correct dose unknown    . metoprolol tartrate (LOPRESSOR) 25 MG tablet TAKE 1/2 (ONE-HALF) TABLET BY MOUTH ONCE DAILY WITH BREAKFAST 45 tablet 3  . nitroGLYCERIN (NITROSTAT) 0.4 MG SL tablet Place 1 tablet (0.4 mg total) under the tongue every 5 (five) minutes x 3 doses as needed for chest pain. 25 tablet 12  . omeprazole (PRILOSEC) 40 MG capsule Take 40 mg by mouth 2 (two) times daily as needed (for GERD-like symptoms).     . prasugrel (EFFIENT) 10 MG TABS tablet Take 1 tablet by mouth once daily 90 tablet 3  . rosuvastatin (CRESTOR) 40 MG tablet Take 1 tablet by mouth once daily 90 tablet 0  . topiramate (TOPAMAX) 50 MG tablet Take 1 tablet by mouth once daily 90 tablet 0  . warfarin (COUMADIN) 5 MG tablet Take 1 tablet (5 mg total) by mouth daily. 30 tablet 1   No current facility-administered medications for this visit.    PHYSICAL EXAMINATION: ECOG PERFORMANCE STATUS: 1 - Symptomatic but completely ambulatory  Vitals:   07/17/20 0841  BP: 133/79  Pulse: 65  Resp: 20  Temp: (!) 97.5 F (36.4 C)  SpO2: 97%   Filed Weights   07/17/20 0841  Weight: (!) 359 lb 11.2 oz (163.2 kg)    LABORATORY DATA:  I have reviewed the data as listed CMP Latest Ref Rng & Units 07/13/2020 07/11/2020  05/27/2020  Glucose 70 - 99 mg/dL 546(E) 703(J) 89  BUN 6 - 20 mg/dL 11 10 7   Creatinine 0.44 - 1.00 mg/dL ) 0.09(F 8.18  Sodium 135 - 145 mmol/L 138 138 141  Potassium 3.5 - 5.1 mmol/L 3.8 3.5 3.9  Chloride 98 - 111 mmol/L 106 105 103  CO2 22 - 32 mmol/L 21(L) 23 24  Calcium 8.9 - 10.3 mg/dL 9.3 9.7 9.4  Total Protein 6.5 - 8.1 g/dL - 7.5 7.1  Total Bilirubin 0.3 - 1.2 mg/dL - 0.5 0.4  Alkaline Phos 38 - 126 U/L - 65 87  AST 15 - 41 U/L - 15 13  ALT 0 - 44 U/L - 15 16    Lab Results   Component Value Date   WBC 14.1 (H) 07/13/2020   HGB 13.8 07/13/2020   HCT 39.1 07/13/2020   MCV 79.5 (L) 07/13/2020   PLT 393 07/13/2020   NEUTROABS 8.2 (H) 07/13/2020    ASSESSMENT & PLAN:  Acute DVT (deep venous thrombosis) (HCC) 07/13/20: Rt leg pain U/S revealed DVT (while on Eliquis and Aspirin: APL Ab syndrome) Possible cause of blood clot: Travel by car to 07/15/20 and back  H/O dural venous sinus thrombosis: Previously treated with Xarelto  Current Treatment: Coumadin Goal INR: 2-3 Lab review: INR today is 1.6. We will increase the dosage of Coumadin to 7.5 mg daily.  RTC weekly for INR checks I will see her back in 1 month.     No orders of the defined types were placed in this encounter.  The patient has a good understanding of the overall plan. she agrees with it. she will call with any problems that may develop before the next visit here.  Total time spent: 20 mins including face to face time and time spent for planning, charting and coordination of care  [place], MD, MPH 07/17/2020  I, Molly Dorshimer, am acting as scribe for Dr. 07/19/2020.  I have reviewed the above documentation for accuracy and completeness, and I agree with the above.

## 2020-07-17 ENCOUNTER — Other Ambulatory Visit: Payer: Self-pay

## 2020-07-17 ENCOUNTER — Inpatient Hospital Stay: Payer: Federal, State, Local not specified - PPO | Attending: Hematology and Oncology

## 2020-07-17 ENCOUNTER — Inpatient Hospital Stay (HOSPITAL_BASED_OUTPATIENT_CLINIC_OR_DEPARTMENT_OTHER): Payer: Federal, State, Local not specified - PPO | Admitting: Hematology and Oncology

## 2020-07-17 DIAGNOSIS — Z79899 Other long term (current) drug therapy: Secondary | ICD-10-CM | POA: Insufficient documentation

## 2020-07-17 DIAGNOSIS — Z7901 Long term (current) use of anticoagulants: Secondary | ICD-10-CM | POA: Insufficient documentation

## 2020-07-17 DIAGNOSIS — I82621 Acute embolism and thrombosis of deep veins of right upper extremity: Secondary | ICD-10-CM

## 2020-07-17 DIAGNOSIS — I82469 Acute embolism and thrombosis of unspecified calf muscular vein: Secondary | ICD-10-CM | POA: Insufficient documentation

## 2020-07-17 DIAGNOSIS — Z7982 Long term (current) use of aspirin: Secondary | ICD-10-CM | POA: Insufficient documentation

## 2020-07-17 LAB — PROTIME-INR
INR: 1.6 — ABNORMAL HIGH (ref 0.8–1.2)
Prothrombin Time: 18.9 seconds — ABNORMAL HIGH (ref 11.4–15.2)

## 2020-07-17 NOTE — Assessment & Plan Note (Signed)
07/13/20: Rt leg pain U/S revealed DVT (while on Eliquis and Aspirin: APL Ab syndrome) H/O dural venous sinus thrombosis Current Treatment: Coumadin Goal INR: 2-3  RTC weekly for INR checks  

## 2020-07-24 ENCOUNTER — Inpatient Hospital Stay: Payer: Medicaid Other | Attending: Hematology and Oncology

## 2020-07-29 ENCOUNTER — Inpatient Hospital Stay: Payer: Medicaid Other | Admitting: Hematology and Oncology

## 2020-07-31 ENCOUNTER — Inpatient Hospital Stay: Payer: Medicaid Other

## 2020-08-02 ENCOUNTER — Other Ambulatory Visit: Payer: Self-pay | Admitting: Family Medicine

## 2020-08-07 ENCOUNTER — Inpatient Hospital Stay: Payer: Medicaid Other

## 2020-08-12 NOTE — Assessment & Plan Note (Deleted)
07/13/20: Rt leg pain U/S revealed DVT (while on Eliquis and Aspirin: APL Ab syndrome) Possible cause of blood clot: Travel by car to [place] and back  H/O dural venous sinus thrombosis: Previously treated with Xarelto  Current Treatment: Coumadin Goal INR: 2-3 Lab review: INR today is 1.6. We will increase the dosage of Coumadin to 7.5 mg daily.  RTC weekly for INR checks I will see her back in 1 month.

## 2020-08-12 NOTE — Progress Notes (Incomplete)
Patient Care Team: Latrelle Dodrill, MD as PCP - General (Pediatrics) Lyn Records, MD as PCP - Cardiology (Cardiology)  DIAGNOSIS:    ICD-10-CM   1. Acute deep vein thrombosis (DVT) of calf muscle vein, unspecified laterality (HCC)  I82.469     CHIEF COMPLIANT: Follow-up of DVT on Coumadin  INTERVAL HISTORY: Angelica Brown is a 45 y.o. 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 She presents to the clinic todayfor follow-up.  ALLERGIES:  is allergic to ampicillin, penicillins, strawberry extract, cephalexin, and prednisone.  MEDICATIONS:  Current Outpatient Medications  Medication Sig Dispense Refill  . acetaminophen (TYLENOL) 500 MG tablet Take 500-1,000 mg by mouth every 6 (six) hours as needed for mild pain or headache.    . cetirizine (ZYRTEC) 10 MG tablet Take 1 tablet (10 mg total) by mouth daily as needed for allergies. 30 tablet 11  . diclofenac Sodium (VOLTAREN) 1 % GEL Apply 4 g topically 4 (four) times daily. 150 g 1  . enoxaparin (LOVENOX) 150 MG/ML injection Inject 1.14 mLs (170 mg total) into the skin every 12 (twelve) hours. 25 mL 0  . HYDROcodone-acetaminophen (NORCO/VICODIN) 5-325 MG tablet Take 1 tablet by mouth every 6 (six) hours as needed for severe pain. 10 tablet 0  . lisinopril (ZESTRIL) 10 MG tablet Take 10 mg by mouth daily. Correct dose unknown    . metoprolol tartrate (LOPRESSOR) 25 MG tablet TAKE 1/2 (ONE-HALF) TABLET BY MOUTH ONCE DAILY WITH BREAKFAST 45 tablet 3  . nitroGLYCERIN (NITROSTAT) 0.4 MG SL tablet Place 1 tablet (0.4 mg total) under the tongue every 5 (five) minutes x 3 doses as needed for chest pain. 25 tablet 12  . omeprazole (PRILOSEC) 40 MG capsule Take 40 mg by mouth 2 (two) times daily as needed (for GERD-like symptoms).     . prasugrel (EFFIENT) 10 MG TABS tablet Take 1 tablet by mouth once daily 90  tablet 0  . rosuvastatin (CRESTOR) 40 MG tablet Take 1 tablet by mouth once daily 90 tablet 0  . topiramate (TOPAMAX) 50 MG tablet Take 1 tablet by mouth once daily 90 tablet 0  . warfarin (COUMADIN) 5 MG tablet Take 1 tablet (5 mg total) by mouth daily. 30 tablet 1   No current facility-administered medications for this visit.    PHYSICAL EXAMINATION: ECOG PERFORMANCE STATUS: {CHL ONC ECOG [phone]}  There were no vitals filed for this visit. There were no vitals filed for this visit.   LABORATORY DATA:  I have reviewed the data as listed CMP Latest Ref Rng & Units 07/13/2020 07/11/2020 05/27/2020  Glucose 70 - 99 mg/dL 409(W) 119(J) 89  BUN 6 - 20 mg/dL 11 10 7   Creatinine 0.44 - 1.00 mg/dL ) 4.78(G 9.56  Sodium 135 - 145 mmol/L 138 138 141  Potassium 3.5 - 5.1 mmol/L 3.8 3.5 3.9  Chloride 98 - 111 mmol/L 106 105 103  CO2 22 - 32 mmol/L 21(L) 23 24  Calcium 8.9 - 10.3 mg/dL 9.3 9.7 9.4  Total Protein 6.5 - 8.1 g/dL - 7.5 7.1  Total Bilirubin 0.3 - 1.2 mg/dL - 0.5 0.4  Alkaline Phos 38 - 126 U/L - 65 87  AST 15 - 41 U/L - 15 13  ALT 0 - 44 U/L - 15 16    Lab Results  Component Value Date   WBC 14.1 (H) 07/13/2020   HGB 13.8 07/13/2020   HCT 39.1  07/13/2020   MCV 79.5 (L) 07/13/2020   PLT 393 07/13/2020   NEUTROABS 8.2 (H) 07/13/2020    ASSESSMENT & PLAN:  No problem-specific Assessment & Plan notes found for this encounter.    No orders of the defined types were placed in this encounter.  The patient has a good understanding of the overall plan. she agrees with it. she will call with any problems that may develop before the next visit here.  Total time spent: *** mins including face to face time and time spent for planning, charting and coordination of care  Sabas Sous, MD, MPH 08/12/2020  I, Molly Dorshimer, am acting as scribe for Dr. Serena Croissant.  {insert scribe attestation}

## 2020-08-13 ENCOUNTER — Inpatient Hospital Stay: Payer: Medicaid Other

## 2020-08-13 ENCOUNTER — Inpatient Hospital Stay: Payer: Medicaid Other | Admitting: Hematology and Oncology

## 2020-08-13 DIAGNOSIS — I82469 Acute embolism and thrombosis of unspecified calf muscular vein: Secondary | ICD-10-CM

## 2020-08-15 ENCOUNTER — Encounter (HOSPITAL_BASED_OUTPATIENT_CLINIC_OR_DEPARTMENT_OTHER): Payer: Self-pay

## 2020-08-15 ENCOUNTER — Emergency Department (HOSPITAL_BASED_OUTPATIENT_CLINIC_OR_DEPARTMENT_OTHER)
Admission: EM | Admit: 2020-08-15 | Discharge: 2020-08-15 | Disposition: A | Discharge status: Home / Self Care | Payer: Federal, State, Local not specified - PPO | Attending: Emergency Medicine | Admitting: Emergency Medicine

## 2020-08-15 ENCOUNTER — Emergency Department (HOSPITAL_BASED_OUTPATIENT_CLINIC_OR_DEPARTMENT_OTHER): Payer: Federal, State, Local not specified - PPO

## 2020-08-15 ENCOUNTER — Other Ambulatory Visit: Payer: Self-pay

## 2020-08-15 DIAGNOSIS — I5022 Chronic systolic (congestive) heart failure: Secondary | ICD-10-CM | POA: Diagnosis not present

## 2020-08-15 DIAGNOSIS — Z7901 Long term (current) use of anticoagulants: Secondary | ICD-10-CM | POA: Diagnosis not present

## 2020-08-15 DIAGNOSIS — I110 Hypertensive heart disease with heart failure: Secondary | ICD-10-CM | POA: Diagnosis not present

## 2020-08-15 DIAGNOSIS — F17210 Nicotine dependence, cigarettes, uncomplicated: Secondary | ICD-10-CM | POA: Insufficient documentation

## 2020-08-15 DIAGNOSIS — I82531 Chronic embolism and thrombosis of right popliteal vein: Secondary | ICD-10-CM | POA: Diagnosis not present

## 2020-08-15 DIAGNOSIS — M79604 Pain in right leg: Secondary | ICD-10-CM

## 2020-08-15 DIAGNOSIS — R7303 Prediabetes: Secondary | ICD-10-CM | POA: Diagnosis not present

## 2020-08-15 DIAGNOSIS — M79661 Pain in right lower leg: Secondary | ICD-10-CM | POA: Diagnosis not present

## 2020-08-15 LAB — CBC WITH DIFFERENTIAL/PLATELET
Abs Immature Granulocytes: 0.1 10*3/uL — ABNORMAL HIGH (ref 0.00–0.07)
Basophils Absolute: 0.1 10*3/uL (ref 0.0–0.1)
Basophils Relative: 1 %
Eosinophils Absolute: 0.4 10*3/uL (ref 0.0–0.5)
Eosinophils Relative: 3 %
HCT: 38 % (ref 36.0–46.0)
Hemoglobin: 13.4 g/dL (ref 12.0–15.0)
Immature Granulocytes: 1 %
Lymphocytes Relative: 36 %
Lymphs Abs: 5.5 10*3/uL — ABNORMAL HIGH (ref 0.7–4.0)
MCH: 28.2 pg (ref 26.0–34.0)
MCHC: 35.3 g/dL (ref 30.0–36.0)
MCV: 79.8 fL — ABNORMAL LOW (ref 80.0–100.0)
Monocytes Absolute: 1.7 10*3/uL — ABNORMAL HIGH (ref 0.1–1.0)
Monocytes Relative: 11 %
Neutro Abs: 7.8 10*3/uL — ABNORMAL HIGH (ref 1.7–7.7)
Neutrophils Relative %: 48 %
Platelets: 328 10*3/uL (ref 150–400)
RBC: 4.76 MIL/uL (ref 3.87–5.11)
RDW: 16 % — ABNORMAL HIGH (ref 11.5–15.5)
WBC: 15.5 10*3/uL — ABNORMAL HIGH (ref 4.0–10.5)
nRBC: 0 % (ref 0.0–0.2)

## 2020-08-15 LAB — BASIC METABOLIC PANEL
Anion gap: 9 (ref 5–15)
BUN: 11 mg/dL (ref 6–20)
CO2: 23 mmol/L (ref 22–32)
Calcium: 9.3 mg/dL (ref 8.9–10.3)
Chloride: 106 mmol/L (ref 98–111)
Creatinine, Ser: 0.72 mg/dL (ref 0.44–1.00)
GFR, Estimated: >60 mL/min (ref >60)
Glucose, Bld: 94 mg/dL (ref 70–99)
Potassium: 3.8 mmol/L (ref 3.5–5.1)
Sodium: 138 mmol/L (ref 135–145)

## 2020-08-15 LAB — PROTIME-INR
INR: 1.3 — ABNORMAL HIGH (ref 0.8–1.2)
Prothrombin Time: 16.6 seconds — ABNORMAL HIGH (ref 11.4–15.2)

## 2020-08-15 MED ORDER — WARFARIN - PHYSICIAN DOSING INPATIENT
Freq: Every day | Status: DC
  Filled 2020-08-15: qty 1

## 2020-08-15 MED ORDER — WARFARIN SODIUM 5 MG PO TABS
5.0000 mg | ORAL_TABLET | Freq: Once | ORAL | Status: AC
  Administered 2020-08-15: 5 mg via ORAL
  Filled 2020-08-15: qty 1

## 2020-08-15 NOTE — ED Triage Notes (Signed)
R lower leg pain just below the calf x 4 days.  Pt has a recent hx of DVTs and states this feels similar.  On Warfarin currently.  Denies any fevers/SOB.  Pt states she has not been on any long trips.

## 2020-08-15 NOTE — Discharge Instructions (Signed)
You were seen in the emergency department today with pain in the right leg.  You have a chronic appearing DVT in this area but nothing changed from your prior studies.  Your INR is low today and so I am giving you an extra dose of Coumadin here.  Please continue your Coumadin as directed and call your primary doctor on Monday for an INR check.  Return to the emergency department any chest pain, shortness of breath, sudden worsening symptoms.

## 2020-08-15 NOTE — ED Notes (Signed)
Pt verbalizes understanding of discharge instructions. Opportunity for questioning and answers were provided. Armand removed by staff, pt discharged from ED to home. Warfarin given.

## 2020-08-15 NOTE — ED Provider Notes (Signed)
Emergency Department Provider Note   I have reviewed the triage vital signs and the nursing notes.   HISTORY  Chief Complaint Leg Pain   HPI Angelica Brown is a 45 y.o. female with past medical history reviewed below including dural venous sinus thrombosis with known underlying clotting disorder currently on Coumadin after failing Xarelto presents to the emergency department with calf pain on the right.  She is followed by hematology and last saw them in March of this year.  They describe the patient forming some superficial thrombophlebitis in this area while on Xarelto and so was switched to Coumadin and Lovenox.  She has since been taken off of the Lovenox and her Coumadin level was increased currently to 7.5 mg.  She has been compliant with this medication but missed her last INR check.  She denies any chest pain, shortness of breath, recent travel.  The pain is just below where she was having pain previously in her right calf.  Unsure regarding swelling in the leg.  No fevers or chills.    Past Medical History:  Diagnosis Date  . ACS (acute coronary syndrome) (HCC) 05/04/2018  . CHOLELITHIASIS   . Chronic systolic CHF (congestive heart failure) (HCC) 06/13/2016   Ischemic CM (inf MI 05/2018) >> Echo 05/04/2018 - Mod LVH, EF 40-45, inf-lat HK, PASP 47 (mild pul HTN)  . Coronary artery disease   . Dermatophytosis of nail   . 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 GERD (gastroesophageal reflux disease)   . Gestational diabetes    "w/all 4 pregnancies"  . Heart murmur   . Hypertension   . Metrorrhagia   . Migraine    "at least twice/month" (05/28/2016)  . Nystagmus 06/30/2016  . OBESITY, MORBID   . OBESITY, NOS   . Pneumonia 11/2014  . POSTURAL LIGHTHEADEDNESS   . Sickle cell trait (HCC)   . STEMI (ST elevation myocardial infarction) (HCC) 11/09/2017   in [place], [place] - see records  in Care Everywhere  . SYNCOPE   . Throat pain   . TOBACCO DEPENDENCE   . URI   . Urinary frequency     Patient Active Problem List   Diagnosis Date Noted  . Acute DVT (deep venous thrombosis) (HCC) 07/15/2020  . Hypertension 05/27/2020  . Lateral epicondylitis 04/30/2020  . Prediabetes 11/27/2019  . Otitis externa 11/07/2019  . Pelvic pain 08/06/2019  . OSA (obstructive sleep apnea) 08/06/2019  . Left leg pain 03/22/2019  . Acute left-sided low back pain without sciatica 11/21/2018  . Left flank pain 11/14/2018  . Hx of Inf MI (7/19 Tx with BMS to RCA; stent thrombosis 9/19 tx with DES to RCA; stent thrombosis 04/2018 tx with thrombectomy) 05/04/2018  . Bradycardia 04/20/2018  . History of MI (myocardial infarction) 04/20/2018  . 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 Seborrheic dermatitis 10/27/2016  . Nystagmus 06/30/2016  . Chronic systolic CHF (congestive heart failure) (HCC) 06/13/2016  . Acute stress reaction 06/09/2016  . Mastoiditis of right side 05/29/2016  . 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 Papilledema of both eyes   . Bacterial meningitis   . Headache 05/26/2016  . Onychomycosis 04/16/2013  . Family history of breast cancer 01/17/2013  . Sickle cell trait (HCC)   . Dizziness 08/29/2008  . OBESITY, MORBID 07/10/2008  . SYNCOPE 10/24/2006  . TOBACCO DEPENDENCE 06/16/2006  . METRORRHAGIA 06/16/2006    Past Surgical  History:  Procedure Laterality Date  . BARTHOLIN GLAND CYST EXCISION Bilateral 2004  . CORONARY THROMBECTOMY N/A 05/04/2018   Procedure: Coronary Thrombectomy;  Surgeon: Lyn Records, MD;  Location[facility] INVASIVE CV LAB;  Service: Cardiovascular;  Laterality: N/A;  . EXCISIONAL HEMORRHOIDECTOMY  2006   "removed a blood clot from one"  . LEFT HEART CATH AND CORONARY ANGIOGRAPHY N/A 05/04/2018   Procedure: LEFT HEART CATH AND CORONARY ANGIOGRAPHY;  Surgeon: Lyn Records, MD;  Location: [facility] INVASIVE CV LAB;  Service:  Cardiovascular;  Laterality: N/A;  . TUBAL LIGATION  2007  . WISDOM TOOTH EXTRACTION  1998    Allergies Ampicillin, Penicillins, Strawberry extract, Cephalexin, and Prednisone  Family History  Problem Relation Age of Onset  . Breast cancer Mother   . Cancer Father        MELANOMA  . Diabetes Father   . Diabetes Paternal Grandmother   . Diabetes Paternal Grandfather   . Breast cancer Maternal Aunt   . Breast cancer Paternal Aunt     Social History Social History   Tobacco Use  . Smoking status: Current Every Day Smoker    Packs/day: 0.15    Years: 18.00    Pack years: 2.70    Types: Cigarettes  . Smokeless tobacco: Never Used  Vaping Use  . Vaping Use: Never used  Substance Use Topics  . Alcohol use: No  . Drug use: No    Review of Systems  Constitutional: No fever/chills Cardiovascular: Denies chest pain. Respiratory: Denies shortness of breath. Gastrointestinal: No abdominal pain.  Musculoskeletal: Negative for back pain. Positive right calf pain.  Skin: Negative for rash. Neurological: Negative for headaches.  10-point ROS otherwise negative.  ____________________________________________   PHYSICAL EXAM:  VITAL SIGNS: ED Triage Vitals  Enc Vitals Group     BP 08/15/20 1811 91/60     Pulse Rate 08/15/20 1811 70     Resp 08/15/20 1811 20     Temp 08/15/20 1811 98 F (36.7 C)     Temp Source 08/15/20 1811 Oral     SpO2 08/15/20 1811 99 %     Weight 08/15/20 1812 (!) 354 lb 6.4 oz (160.8 kg)     Height 08/15/20 1812 5\' 9"  (1.753 m)   Constitutional: Alert and oriented. Well appearing and in no acute distress. Eyes: Conjunctivae are normal.  Head: Atraumatic. Nose: No congestion/rhinnorhea. Mouth/Throat: Mucous membranes are moist.  Neck: No stridor.  Cardiovascular: Good peripheral circulation.    Respiratory: Normal respiratory effort.  Gastrointestinal: No distention.  Musculoskeletal: Some tenderness noted in the right lower calf.  More  focal area of swelling superior to this but no cellulitis, ulceration, or obvious palpable cords of superficial vein Neurologic:  Normal speech and language. No gross focal neurologic deficits are appreciated.  Skin:  Skin is warm, dry and intact. No rash noted.  ____________________________________________   LABS (all labs ordered are listed, but only abnormal results are displayed)  Labs Reviewed  CBC WITH DIFFERENTIAL/PLATELET - Abnormal; Notable for the following components:      Result Value   WBC 15.5 (*)    MCV 79.8 (*)    RDW 16.0 (*)    Neutro Abs 7.8 (*)    Lymphs Abs 5.5 (*)    Monocytes Absolute 1.7 (*)    Abs Immature Granulocytes 0.10 (*)    All other components within normal limits  PROTIME-INR - Abnormal; Notable for the following components:   Prothrombin Time 16.6 (*)  INR 1.3 (*)    All other components within normal limits  BASIC METABOLIC PANEL   ____________________________________________  RADIOLOGY  US Venous Img Lower Right (DVT Study)  Result Date: 08/15/2020 CLINICAL DATA:  Right leg pain EXAM: Right LOWER EXTREMITY VENOUS DOPPLER ULTRASOUND TECHNIQUE: Gray-scale sonography with compression, as well as color and duplex ultrasound, were performed to evaluate the deep venous system(s) from the level of the common femoral vein through the popliteal and proximal calf veins. COMPARISON:  July 13, 2020 FINDINGS: VENOUS Again noted is chronic thrombus within the right popliteal vein. There is unchanged thrombus within the right peroneal vein. No new acute appearing thrombus identified on this exam. The venous waveforms throughout the right lower extremity were unremarkable. At the patient's area of concern at the right posterior lower calf/ankle there is evidence for superficial thrombophlebitis in associated varicose veins. Limited views of the contralateral common femoral vein are unremarkable. OTHER None. Limitations: none IMPRESSION: 1. 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 2. Superficial thrombophlebitis involving varicosities at the level of the patient's ankle. Electronically Signed   By: Katherine Mantle M.D.   On: 08/15/2020 19:29    ____________________________________________   PROCEDURES  Procedure(s) performed:   Procedures  None ____________________________________________   INITIAL IMPRESSION / ASSESSMENT AND PLAN / ED COURSE  Pertinent labs & imaging results that were available during my care of the patient were reviewed by me and considered in my medical decision making (see chart for details).   Patient presents to the emergency department with history of clotting disorder currently on Coumadin.  She has known superficial thrombophlebitis in the right calf but having additional pain lower in the calf and concern for DVT.  She has failed Xarelto in the past.  Plan for INR check along with DVT ultrasound here.  Patient is not having signs or symptoms to suspect PE clinically.   [place] reviewed. Unchanged chronic DVT in the right calf with superficial thrombophlebitis. INR is low here. Extra dose of Coumadin given. Patient to call for INR check early next week. Discussed ED return precautions.  ____________________________________________  FINAL CLINICAL IMPRESSION(S) / ED DIAGNOSES  Final diagnoses:  Pain of right lower extremity    MEDICATIONS GIVEN DURING THIS VISIT:  Medications  warfarin (COUMADIN) tablet 5 mg (5 mg Oral Given 08/15/20 2010)    Note:  This document was prepared using Dragon voice recognition software and may include unintentional dictation errors.  Alona Bene, MD, [facility] Emergency Medicine    Chelcy Bolda, Arlyss Repress, MD 08/16/20 [phone]

## 2020-09-01 ENCOUNTER — Telehealth: Payer: Self-pay | Admitting: Hematology and Oncology

## 2020-09-01 NOTE — Telephone Encounter (Signed)
R/s per 3/31 los, left message

## 2020-09-12 ENCOUNTER — Inpatient Hospital Stay: Payer: Medicaid Other | Admitting: Hematology and Oncology

## 2020-09-12 ENCOUNTER — Inpatient Hospital Stay: Payer: Medicaid Other | Attending: Hematology and Oncology

## 2020-09-12 NOTE — Assessment & Plan Note (Deleted)
07/13/20: Rt leg pain U/S revealed DVT (while on Eliquis and Aspirin: APL Ab syndrome) Possible cause of blood clot: Travel by car to [place] and back  H/O dural venous sinus thrombosis: Previously treated with Xarelto  Current Treatment: Coumadin Goal INR: 2-3 Lab review: 08/15/2020: INR today is 1.3. Current Coumadin dose:  RTC weekly for INR checks I will see her back in 1 month.

## 2020-09-23 ENCOUNTER — Telehealth: Payer: Self-pay

## 2020-09-23 NOTE — Telephone Encounter (Signed)
Patient calls nurse line to report positive home COVID test. Symptom onset was Sunday, 6/5 with positive test today. Patient reports cough, irritated throat and headache. Patient reports good appetite and fluid intake.   Patient wanted to follow up with PCP as she has CHF. Patient does not have SHOB at this time. Advised of supportive measures.   Please advise if patient would be a good candidate for infusion therapy or oral antivirals.   Veronda Prude, RN

## 2020-09-23 NOTE — Telephone Encounter (Signed)
Scheduled patient virtual visit in [facility] clinic tomorrow morning at 9:30.  Patient appreciative.   Veronda Prude, RN

## 2020-09-23 NOTE — Telephone Encounter (Signed)
Patient may be a candidate for antiviral therapy, but this will require a prolonged discussion about possible interactions with her medications (specifically, rosuvastatin and warfarin, but need to clarify her current medication list as well).  I am working in the hospital this week and am not able to call her to have this discussion. Please schedule her on a virtual visit with any provider in [facility] to address this. The preceptor will be available to help; residents are required to precept any prescription of antivirals for COVID.  Thanks, Latrelle Dodrill, MD

## 2020-09-24 ENCOUNTER — Telehealth (INDEPENDENT_AMBULATORY_CARE_PROVIDER_SITE_OTHER): Payer: Federal, State, Local not specified - PPO | Admitting: Family Medicine

## 2020-09-24 ENCOUNTER — Other Ambulatory Visit: Payer: Self-pay

## 2020-09-24 VITALS — Temp 101.6°F | Wt 352.0 lb

## 2020-09-24 DIAGNOSIS — Z7901 Long term (current) use of anticoagulants: Secondary | ICD-10-CM | POA: Insufficient documentation

## 2020-09-24 DIAGNOSIS — U071 COVID-19: Secondary | ICD-10-CM | POA: Insufficient documentation

## 2020-09-24 MED ORDER — MOLNUPIRAVIR EUA 200MG CAPSULE: 4.0000 | ORAL_CAPSULE | Freq: Two times a day (BID) | ORAL | 0 refills | Status: AC

## 2020-09-24 MED ORDER — WARFARIN SODIUM 7.5 MG PO TABS: 7.5000 mg | ORAL_TABLET | Freq: Every day | ORAL | 1 refills | Status: DC

## 2020-09-24 NOTE — Patient Instructions (Addendum)
Please start Molnupiravir: take 4 tablets (800mg ) twice a day for 5 days Please restart your warfarin as prescribed and follow-up for an INR lab check next Friday.   Try to avoid any NSAIDs (Aleve, ibuprofen) due to your heart failure. Reasons to follow-up or go to the emergency department includes worsening shortness of breath, chest pain, pain when you take deep breaths, lower extremity swelling, unable to stay well-hydrated. Follow-up with your PCP in July as scheduled to discuss your anticoagulation   What You Can Do to Feel Better When You Have a Viral Illness Common Symptoms: runny eyes, muscle aches, throat irritation, ear pain, cough, sneezing, runny nose or congested nose, sinus pressure, headache, fever, fatigue   For your cough, try these:  Teaspoon of honey either alone or mixed with warm water  Tessalon pearls  Lozenges or hard candies  Laying with head elevated  Vicks/menthol rub topically on chest   For your congestion, try these:   Steroid nasal spray such as fluticasone or budesonide- helps prevent swelling in the nasal passage  Guaifenesin (mucinex) - helps thin the mucus  Steam- in a closed bathroom with hot shower running or a bedside humidifier  Drinking plenty of fluids to stay hydrated can help thin mucus   For your runny nose:   Atrovent nasal spray- helps decrease the drainage (can lead to dryness if overdone)  Nasal rinses such as a netty pot or a bulb syringe using filtered water mixed with small amount of baking soda and/or sea salt   For your fever:   Acetaminophen (Tylenol) up to 4g per day for most people:   For your sore throat:  Drinking either warm or cold liquids (whichever feels best to you)  Gargling warm salt water    Help prevent spreading of infection to others.  Wash your hands frequently  Avoid crowded places  Wear a mask when in public  Get your regularly scheduled vaccinations as they are recommended by the CDC.    Fun facts: -Antibiotics treat bacteria and have no effect on viruses so are not helpful in the vast majority of upper respiratory illnesses which are caused by common cold or flu viruses.  -Generic over the counter (OTC) medications have the same active ingredients and effectiveness of the more expensive name-brand version. -Vaccines are available to prevent infection with several of the most infectious/deadly viruses.

## 2020-09-24 NOTE — Assessment & Plan Note (Addendum)
Acute. Symptom onset 09/21/2020 and confirmed positive on 09/23/2020.  Overall mild symptoms.  She is well-appearing on exam today.  Given her comorbidities I do feel she would benefit from antiviral treatment.  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  No identified interactions on my review.  Discussed symptomatic treatment and return precautions.  She voiced understanding agreement with plan.  Follow-up scheduled for next week and access to care to ensure continued improvement.

## 2020-09-24 NOTE — Assessment & Plan Note (Addendum)
Patient is on chronic warfarin with most recent INR in April 2022 that was subtherapeutic.  She endorses nonadherence to warfarin daily.  Last taken on 09/23/20.  She has recently run out of her prescription.  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  Long discussion regarding anticoagulation and she was strongly encouraged to continue warfarin daily as prescribed particularly in setting of her recent DVT and current COVID 19 infection.  She was amendable to continuing warfarin at this time.  Refill of warfarin 7.5 mg daily sent to pharmacy.  Follow-up scheduled for next week for INR check and adjustments as needed.  I also scheduled her with her PCP in July (first available) to further discuss her anticoagulation concerns.  Discussed strict return/ED precautions given her clotting history.

## 2020-09-24 NOTE — Progress Notes (Signed)
[facility] Telemedicine Visit  I connected with Angelica Brown on 09/24/20 at  9:30 AM EDT by a video enabled telemedicine application and verified that I am speaking with the correct person using two identifiers.     I discussed the limitations of evaluation and management by telemedicine and the availability of in person appointments.  I discussed that the purpose of this telehealth visit is to provide medical care while limiting exposure to the novel coronavirus.  The patient expressed understanding and agreed to proceed.  Patient consented to have virtual visit. Method of visit: Video  Encounter participants: Patient: Angelica Brown - located at home Provider: Joana Reamer - located at [facility] Others (if applicable): None  Chief Complaint: COVID positive  HPI: Symptoms developed on 09/21/20. This included cough, rhinorrhea, headache, fatigue. Productive of clear phlegm. Found to be COVID positive on 09/23/20 on two home pregnancy tests. She is vaccinated x3. She denies any chest pain or SOB. She notes that her daughter had graduation this past weekend so she has been around other people and possible exposure in that setting.  She notes that she is supposed to be on Warfarin but has not been compliant with this. Last taken last night. She notes that she doesn't want to take Warfarin anymore and wants to go back on Xarelto. Has history of DVT in setting travel while on Xarelto.  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 She has known superficial thrombophlebitis in right calf. She has failed Xarelto in past. 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 INR at that time was subtherapeutic.  Chronic HFrEF. H/o MI. H/o Bacterial meningitis.   Exam:  Gen: well appearing, sitting comfortably, well nourished, well developed, in no acute distress  with non-toxic appearance Respiratory:  breathing comfortably on room air, speaking in full sentences Neuro: Alert and oriented, speech normal  Assessment/Plan:  COVID-19 virus infection Acute. Symptom onset 09/21/2020 and confirmed positive on 09/23/2020.  Overall mild symptoms.  She is well-appearing on exam today.  Given her comorbidities I do feel she would benefit from antiviral treatment.  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  No identified interactions on my review.  Discussed symptomatic treatment and return precautions.  She voiced understanding agreement with plan.  Follow-up scheduled for next week and access to care to ensure continued improvement.  Chronic anticoagulation Patient is on chronic warfarin with most recent INR in April 2022 that was subtherapeutic.  She endorses nonadherence to warfarin daily.  Last taken on 09/23/20.  She has recently run out of her prescription.  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  Long discussion regarding anticoagulation and she was strongly encouraged to continue warfarin daily as prescribed particularly in setting of her recent DVT and current COVID 19 infection.  She was amendable to continuing warfarin at this time.  Refill of warfarin 7.5 mg daily sent to pharmacy.  Follow-up scheduled for next week for INR check and adjustments as needed.  I also scheduled her with her PCP in July (first available) to further discuss her anticoagulation concerns.  Discussed strict return/ED precautions given her clotting history.    I discussed the assessment and treatment plan  with the patient. They were provided an opportunity to ask questions and all were answered. They agreed with the plan and demonstrated an understanding of the instructions.   They were advised to call back or seek  an in-person evaluation in the emergency room if the symptoms worsen or if the condition fails to improve as anticipated.  Total time: 20 minutes. This includes time spent with the patient during the visit as well as time spent before and after the visit reviewing the chart, documenting the encounter, making phone calls, reviewing studies, etc.  Joana Reamer, DO  [facility], PGY3 09/24/2020 5:44 PM

## 2020-10-03 ENCOUNTER — Ambulatory Visit: Payer: Federal, State, Local not specified - PPO

## 2020-10-10 ENCOUNTER — Other Ambulatory Visit: Payer: Self-pay

## 2020-10-10 ENCOUNTER — Ambulatory Visit (INDEPENDENT_AMBULATORY_CARE_PROVIDER_SITE_OTHER): Payer: Federal, State, Local not specified - PPO | Admitting: Family Medicine

## 2020-10-10 DIAGNOSIS — J069 Acute upper respiratory infection, unspecified: Secondary | ICD-10-CM | POA: Diagnosis not present

## 2020-10-10 NOTE — Assessment & Plan Note (Signed)
Pt recently had COVID earlier this month. S/p Molnupiravir 800mg  treatment. She felt fairly better after COVID but now feels worse again. Most likely a new viral URI. Normal examination and vital signs which is reassuring. Recommended conservative management. Also recommended against use of NSAIDs with warfarin due to the risk of bleeding. Low threshold to return to clinic or go to the ER given her co-morbidities. Pt expressed understanding. Also recommended 4th covid vaccine/booster when able.

## 2020-10-10 NOTE — Patient Instructions (Addendum)
Thank you for coming to see me today. It was a pleasure. Today we discussed your new symptoms since you had covid. I think you have another viral infection like the common cold. You are probably still feeling unwell due to the recent covid infection. I recommend: -Tylenol as needed -No ibuprofen or NSAIDs as this can increase risk of bleeding as you are on warfarin  -Rest and fluids -Monitor your symptoms, low threshold to go to the ER if your symptoms worsen, get chest pain, shortness of breath, fevers temp>100.4 etc  Please follow-up with PCP  If you have any questions or concerns, please do not hesitate to call the office at [phone].  Best wishes,   Dr Allena Katz

## 2020-10-10 NOTE — Progress Notes (Signed)
     SUBJECTIVE:   CHIEF COMPLAINT / HPI:   Angelica Brown is a 45 y.o. female presents to [facility] clinic for covid follow up   Pt tested positive for covid on 09/23/2020 via rapid antigen test. Spoke with Dr Mauri Reading via Telemedicine on 09/24/20. Treated with Molnupiravir 800mg  BID x 5 days due to multiple co-morbidies. Took some alleve yesterday. Presents today for follow up. Covid vaccination(s): x 3. She reports that 3 days she started to get "itching" in her throat. She now has a myalgias,ear ache, runny nose,  productive cough with thick and green. No fevers but feeling cold. Denies dyspnea or chest pain. Sick contacts: godmother has PNA. She works at group home.    Flowsheet Row Office Visit from 06/12/2020 in [facility]  PHQ-9 Total Score 2        PERTINENT  PMH / PSH: CHF, MI, DVT, lupus anticoagulant   OBJECTIVE:   BP 107/71   Pulse 67   Temp 98.8 F (37.1 C) (Oral)   Ht 5\' 9"  (1.753 m)   SpO2 98%   BMI 51.98 kg/m    General: Alert, no acute distress, pleasant  HEENT: NCAT, no pharyngeal edema, exudates, normal TM bilaterally, no cervical lymphadenopathy  Cardio: Normal S1 and S2, RRR, no r/m/g Pulm: normal work of breathing, poor inspiratory so limited pulm exam  Extremities: No peripheral edema.  Neuro: Cranial nerves grossly intact   ASSESSMENT/PLAN:   Upper respiratory infection Pt recently had COVID earlier this month. S/p Molnupiravir 800mg  treatment. She felt fairly better after COVID but now feels worse again. Most likely a new viral URI. Normal examination and vital signs which is reassuring. Recommended conservative management. Also recommended against use of NSAIDs with warfarin due to the risk of bleeding. Low threshold to return to clinic or go to the ER given her co-morbidities. Pt expressed understanding. Also recommended 4th covid vaccine/booster when able.       [place]es, MD PGY-2 [facility]

## 2020-10-15 NOTE — Progress Notes (Signed)
    SUBJECTIVE:   CHIEF COMPLAINT / HPI:  Patient presents today for a work note for returning to work due to her recent COVID infection.  Tested positive on 09/23/2020 with multiple home tests.  Since that time symptoms have improved.  Still has some lingering congestion, generalized fatigue, occasional "brain fog"..  No chest pain, no dyspnea on exertion.  Patient was treated with molnupiravir.  Patient works at [employer] at [employer] and mostly works at home but occasionally has to go into work.  Patient would like to wait "a few weeks" to get a second booster shot.  PERTINENT  PMH / PSH: CHF, obesity  OBJECTIVE:   BP 120/68   Pulse 65   Ht 5\' 9"  (1.753 m)   Wt (!) 360 lb 4 oz (163.4 kg)   SpO2 95%   BMI 53.20 kg/m   General: Alert and oriented.  No acute distress.  Obese female appears stated age. CV: Regular rate and rhythm, no murmurs. Pulmonary: Lungs clear to auscultation bilaterally, no wheezes or crackles.  ASSESSMENT/PLAN:   History of 2019 novel coronavirus disease (COVID-19) It has now been over 3 weeks since the patient tested positive.  No fevers recently, symptoms are improving.  Provided work note stating she is safe to return to work.  Advised patient she can follow-up in the future if she is concerned about long-term COVID symptoms.  Patient will return in a few weeks to get second booster.  Patient should qualify for second booster given her cardiac history and obesity.     2020, MD [facility]

## 2020-10-16 ENCOUNTER — Encounter: Payer: Self-pay | Admitting: Family Medicine

## 2020-10-16 ENCOUNTER — Other Ambulatory Visit: Payer: Self-pay

## 2020-10-16 ENCOUNTER — Ambulatory Visit (INDEPENDENT_AMBULATORY_CARE_PROVIDER_SITE_OTHER): Payer: Federal, State, Local not specified - PPO | Admitting: Family Medicine

## 2020-10-16 VITALS — BP 120/68 | HR 65 | Ht 69.0 in | Wt 360.2 lb

## 2020-10-16 DIAGNOSIS — Z8616 Personal history of COVID-19: Secondary | ICD-10-CM | POA: Insufficient documentation

## 2020-10-16 NOTE — Assessment & Plan Note (Signed)
It has now been over 3 weeks since the patient tested positive.  No fevers recently, symptoms are improving.  Provided work note stating she is safe to return to work.  Advised patient she can follow-up in the future if she is concerned about long-term COVID symptoms.  Patient will return in a few weeks to get second booster.  Patient should qualify for second booster given her cardiac history and obesity.

## 2020-10-16 NOTE — Patient Instructions (Signed)
It was nice to see you today,  I have given you a letter that says you are okay to return to work.  If you have any concerns about long-term COVID symptoms in the future he can always schedule another appointment with [place].  Have a great day,  Frederic Jericho, MD

## 2020-11-04 ENCOUNTER — Ambulatory Visit: Payer: Federal, State, Local not specified - PPO | Admitting: Family Medicine

## 2020-11-24 ENCOUNTER — Other Ambulatory Visit: Payer: Self-pay | Admitting: Family Medicine

## 2020-11-24 ENCOUNTER — Encounter: Payer: Self-pay | Admitting: Family Medicine

## 2020-11-24 MED ORDER — TOPIRAMATE 50 MG PO TABS: 50.0000 mg | ORAL_TABLET | Freq: Every day | ORAL | 0 refills | Status: DC

## 2020-11-24 MED ORDER — ROSUVASTATIN CALCIUM 40 MG PO TABS: 40.0000 mg | ORAL_TABLET | Freq: Every day | ORAL | 0 refills | Status: DC

## 2020-11-24 NOTE — Telephone Encounter (Signed)
Sent patient a message to clarify if she is taking coumadin. Will wait to hear back from her before refilling effient.  Latrelle Dodrill, MD

## 2020-11-25 NOTE — Telephone Encounter (Signed)
Red team, can you contact patient and find out if she is taking coumadin or a different blood thinner? I need to know this prior to refilling. I sent her a mychart message on Monday but it does not appear that she has read it.  Thanks, Latrelle Dodrill, MD

## 2020-11-26 NOTE — Telephone Encounter (Signed)
Pt states that she is taking coumadin and needs a refill on that. Dannell Raczkowski Bruna Potter, CMA

## 2020-11-26 NOTE — Telephone Encounter (Signed)
Called and spoke with patient. She confirms she is taking coumadin 7.5mg  daily but has not had an INR checked in many months (last in our system was the end of April in the ED). The coumadin was prescribed by her hematologist but she is requesting a refill on it from me. Advised patient that she must come in for INR check prior to refilling this medication, stressed importance of routine monitoring of INRs with chronic coumadin therapy. Appointment scheduled at 8:45a tomorrow at our lab at the [facility].   FYI to [employer] in the lab.  Thanks, Latrelle Dodrill, MD

## 2020-11-27 ENCOUNTER — Ambulatory Visit: Payer: Federal, State, Local not specified - PPO

## 2020-11-28 ENCOUNTER — Ambulatory Visit (INDEPENDENT_AMBULATORY_CARE_PROVIDER_SITE_OTHER): Payer: Federal, State, Local not specified - PPO | Admitting: *Deleted

## 2020-11-28 ENCOUNTER — Other Ambulatory Visit: Payer: Self-pay

## 2020-11-28 DIAGNOSIS — Z7901 Long term (current) use of anticoagulants: Secondary | ICD-10-CM

## 2020-11-28 LAB — POCT INR: INR: 1.1 — AB (ref 2.0–3.0)

## 2020-11-28 MED ORDER — WARFARIN SODIUM 5 MG PO TABS: 7.5000 mg | ORAL_TABLET | Freq: Every day | ORAL | 0 refills | Status: DC

## 2020-11-28 MED ORDER — PRASUGREL HCL 10 MG PO TABS: 10.0000 mg | ORAL_TABLET | Freq: Every day | ORAL | 0 refills | Status: DC

## 2020-11-28 NOTE — Progress Notes (Signed)
INR low, has been out of coumadin for a few days Notably has had no INR checks for 4 months and has continued to take coumadin at home - no idea whether she has been therapeutic or not. Will resume home dose of 7.5mg  coumadin daily and have her return in 1 week for INR check. Refilled effient. Patient aware she must come in for INR checks regularly. She has an upcoming appointment with me where we will discuss this in more detail, and hopefully get her back in with hematology.  Latrelle Dodrill, MD

## 2020-12-08 ENCOUNTER — Other Ambulatory Visit: Payer: Self-pay

## 2020-12-08 ENCOUNTER — Ambulatory Visit (INDEPENDENT_AMBULATORY_CARE_PROVIDER_SITE_OTHER): Payer: Federal, State, Local not specified - PPO | Admitting: Pharmacist

## 2020-12-08 DIAGNOSIS — Z7901 Long term (current) use of anticoagulants: Secondary | ICD-10-CM | POA: Diagnosis not present

## 2020-12-08 DIAGNOSIS — D6861 Antiphospholipid syndrome: Secondary | ICD-10-CM | POA: Diagnosis not present

## 2020-12-08 DIAGNOSIS — I82469 Acute embolism and thrombosis of unspecified calf muscular vein: Secondary | ICD-10-CM

## 2020-12-08 LAB — POCT INR: INR: 1 — AB (ref 2.0–3.0)

## 2020-12-08 NOTE — Progress Notes (Signed)
    Pharmacy Anticoagulation Clinic  Subjective: Patient presents today for INR monitoring. 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  Last INR on 11/28/20 was 1.1, in which patient reported being out of warfarin for several days.  Current dose of warfarin: 7.5mg  daily (52.5mg  weekly dose)  Adherence to warfarin: sub-optimal (2 missed doses) Signs/symptoms of bleeding: denies Recent changes in diet: "eating more dark leafy greens" Recent changes in medications: denies Upcoming procedures that may impact anticoagulation: denies   Objective: Today's INR = 1.0  Lab Results  Component Value Date   INR 1.0 (A) 12/08/2020   INR 1.1 (A) 11/28/2020   INR 1.3 (H) 08/15/2020     Assessment and Plan: Anticoagulation: Patient is Subtherapeutic based on patient's INR of 1.0 and patient's INR goal of 2-3. Will Increase current warfarin dose: 11.25 mg every Wed; 7.5 mg all other days (56.25mg  weekly dose)  Patient verbalized understanding and was provided with written instructions. Next INR check planned for one week.

## 2020-12-08 NOTE — Patient Instructions (Addendum)
It was nice to meet you Angelica Brown!  Please take 1 and 1/2 tablets on Wednesday and 1 tablet all other days.  Follow-up in a week.

## 2020-12-09 ENCOUNTER — Ambulatory Visit (INDEPENDENT_AMBULATORY_CARE_PROVIDER_SITE_OTHER): Payer: Federal, State, Local not specified - PPO | Admitting: Family Medicine

## 2020-12-09 ENCOUNTER — Other Ambulatory Visit: Payer: Self-pay

## 2020-12-09 VITALS — BP 127/85 | HR 87 | Ht 69.0 in | Wt 359.2 lb

## 2020-12-09 DIAGNOSIS — I252 Old myocardial infarction: Secondary | ICD-10-CM

## 2020-12-09 DIAGNOSIS — J358 Other chronic diseases of tonsils and adenoids: Secondary | ICD-10-CM

## 2020-12-09 DIAGNOSIS — D6861 Antiphospholipid syndrome: Secondary | ICD-10-CM | POA: Diagnosis not present

## 2020-12-09 MED ORDER — NITROGLYCERIN 0.4 MG SL SUBL: 0.4000 mg | SUBLINGUAL_TABLET | SUBLINGUAL | 12 refills | Status: DC | PRN

## 2020-12-09 NOTE — Progress Notes (Signed)
SUBJECTIVE:   CHIEF COMPLAINT / HPI:   [name] presents today for scheduled follow up for anticoagulation.  Anticoagulation management -  - [name] most recently had blood clot in her right lower extremity a few months ago, developed while on xarelto. - Her hematologist put her on coumadin which has been inconvenient for her. She is not able to reliably come to appointments to get her INR checked and wishes to come off the drug. We discussed her hematologist would be the best person to contact to make this change given xarelto alone has not controlled her hypercoagulability.  - She requested trying to schedule INR appointments on Monday to alleviate the scheduling burden and conflict with her supervisor as well as requested a work note.   Tonsil stones -  - They have been increasingly bothering her and now affect her eating as they cause her to gag. They are also disruptive to her life as they bring on coughing that results in her urinating herself. She had previously gotten a consult to get the removed but was told they would not be able to perform the surgery until she came off the blood thinners.  - She is so desperate to get them out she has considered going off the blood thinners herself. We advised her doing it on her own would be a bad idea given the complications she has faced with her blood disorder.   Weight  - She has made many dietary and activity changes in her life and is frustrated with the rate at which she is losing weight. She has been losing about 1 pound a month and hopes to lose more.  - She would like to get her TSH level checked to see if that is impacting her ability to lose weight.  - She has cut out beef and pork, is primarily eating grilled/baked chicken and [place], is only drinking water and eating healthier grains like quinoa.  Meds - She is taking her medications as prescribed.  PERTINENT  PMH / PSH:  Dual venous sinus thrombosis Heart  failure Hypertension MI with stent placement Antiphospholipid antibody sundrome Obesity   OBJECTIVE:   BP 127/85   Pulse 87   Ht 5\' 9"  (1.753 m)   Wt (!) 359 lb 3.2 oz (162.9 kg)   SpO2 96%   BMI 53.04 kg/m   GEN: Well appearing, in no acute distress SKIN: Warm, dry, moist mucus membranes CARD: Normal rate, rhythm, no murmurs or gallops PULM: Clear to ausculation, normal work of breathing NEUR: Grossly normal Psych: normal range of affect, well groomed, speech normal in rate and volume, normal eye contact   ASSESSMENT/PLAN:   OBESITY, MORBID Lost 4 lbs over 4 months. Interested in Lap band surgery. Discussed she is welcome to schedule a consultation appointment to see if surgery is feasible given blood thinning medication regimen. She will first meet with cardiology & hematology Offered referral to nutritionist, she prefers to wait at this time Check TSH today  Antiphospholipid antibody syndrome (HCC) Currently on coumadin. Has failed to follow up consistently for INR checks. INR is 1.0 (12/08/20), subtherapeutic. Goal between 2.0-3.0. pharmacy is adjusting dosing.  Patient desires change back to DOAC. Advised she needs to discuss this with hematology. She is at high risk of recurrent VTE given her many prior episodes of clotting. Patient will schedule with hematology. Stressed importance of adherence with INRs in the meantime in order to ensure she is adequately anticoagulated.   History of  MI (myocardial infarction) Refilled nitro at patient's request (has not had to use) Encouraged her to follow up with cardiology to discuss whether she would be a candidate to come off antiplatelet in order to have tonsillectomy and weight loss surgery. Advised that given her cardiac history I strongly doubt they would endorse coming off medications. She will schedule with cardiology to discuss.  Tonsillolith Strongly desires removal of tonsils due to burden of tonsil stones, but was  previously told by ENT she would have to come off all blood thinners prior to surgery. Discussed with her that I strongly doubt she will be approved to discontinue antiplatelets and anticoagulation given her cardiac and clotting history. She will schedule with hematology and cardiology to discuss with them.   Alroy Bailiff, Medical Student [facility]    Patient seen along with MS3 student Alease Medina. I personally evaluated this patient along with the student, and verified all aspects of the history, physical exam, and medical decision making as documented by the student. I agree with the student's documentation and have made all necessary edits.  Levert Feinstein, MD  [facility]

## 2020-12-09 NOTE — Assessment & Plan Note (Addendum)
Lost 4 lbs over 4 months. Interested in Lap band surgery. Discussed she is welcome to schedule a consultation appointment to see if surgery is feasible given blood thinning medication regimen. She will first meet with cardiology & hematology Offered referral to nutritionist, she prefers to wait at this time Check TSH today

## 2020-12-09 NOTE — Assessment & Plan Note (Signed)
Strongly desires removal of tonsils due to burden of tonsil stones, but was previously told by ENT she would have to come off all blood thinners prior to surgery. Discussed with her that I strongly doubt she will be approved to discontinue antiplatelets and anticoagulation given her cardiac and clotting history. She will schedule with hematology and cardiology to discuss with them.

## 2020-12-09 NOTE — Assessment & Plan Note (Deleted)
Currently on coumadin. INR is 1.0 (12/08/20), subtherapeutic. Goal between 2.0-3.0. Attending weekly visits to monitor INR. Pharmacist advised doubling dose 2 days this week to observe change in INR. Will discuss challenges with taking coumadin with hematologist to identify if there are other medication management options that require less office visits.

## 2020-12-09 NOTE — Assessment & Plan Note (Signed)
Currently on coumadin. Has failed to follow up consistently for INR checks. INR is 1.0 (12/08/20), subtherapeutic. Goal between 2.0-3.0. pharmacy is adjusting dosing.  Patient desires change back to DOAC. Advised she needs to discuss this with hematology. She is at high risk of recurrent VTE given her many prior episodes of clotting. Patient will schedule with hematology. Stressed importance of adherence with INRs in the meantime in order to ensure she is adequately anticoagulated.

## 2020-12-09 NOTE — Assessment & Plan Note (Signed)
Refilled nitro at patient's request (has not had to use) Encouraged her to follow up with cardiology to discuss whether she would be a candidate to come off antiplatelet in order to have tonsillectomy and weight loss surgery. Advised that given her cardiac history I strongly doubt they would endorse coming off medications. She will schedule with cardiology to discuss.

## 2020-12-09 NOTE — Patient Instructions (Signed)
Keep following up for INRs Schedule with hematologist and cardiologist Checking thyroid Let me know if you want to see nutritionist Follow up with me in 3 months, sooner if needed  Be well, Dr. Pollie Meyer

## 2020-12-10 LAB — TSH: TSH: 1.2 u[IU]/mL (ref 0.450–4.500)

## 2020-12-15 ENCOUNTER — Ambulatory Visit: Payer: Federal, State, Local not specified - PPO | Admitting: Pharmacist

## 2021-01-08 ENCOUNTER — Other Ambulatory Visit: Payer: Self-pay

## 2021-01-08 ENCOUNTER — Encounter: Payer: Self-pay | Admitting: Family Medicine

## 2021-01-08 ENCOUNTER — Encounter (HOSPITAL_COMMUNITY): Payer: Self-pay | Admitting: Emergency Medicine

## 2021-01-08 ENCOUNTER — Ambulatory Visit (INDEPENDENT_AMBULATORY_CARE_PROVIDER_SITE_OTHER): Payer: Federal, State, Local not specified - PPO | Admitting: Family Medicine

## 2021-01-08 ENCOUNTER — Emergency Department (HOSPITAL_COMMUNITY)
Admission: EM | Admit: 2021-01-08 | Discharge: 2021-01-08 | Disposition: A | Discharge status: Home / Self Care | Payer: Federal, State, Local not specified - PPO | Attending: Emergency Medicine | Admitting: Emergency Medicine

## 2021-01-08 ENCOUNTER — Emergency Department (HOSPITAL_COMMUNITY): Payer: Federal, State, Local not specified - PPO

## 2021-01-08 VITALS — BP 124/72 | HR 72 | Ht 69.0 in | Wt 364.0 lb

## 2021-01-08 DIAGNOSIS — Z79899 Other long term (current) drug therapy: Secondary | ICD-10-CM | POA: Insufficient documentation

## 2021-01-08 DIAGNOSIS — R791 Abnormal coagulation profile: Secondary | ICD-10-CM | POA: Insufficient documentation

## 2021-01-08 DIAGNOSIS — H66002 Acute suppurative otitis media without spontaneous rupture of ear drum, left ear: Secondary | ICD-10-CM | POA: Diagnosis not present

## 2021-01-08 DIAGNOSIS — R9431 Abnormal electrocardiogram [ECG] [EKG]: Secondary | ICD-10-CM | POA: Diagnosis not present

## 2021-01-08 DIAGNOSIS — I2510 Atherosclerotic heart disease of native coronary artery without angina pectoris: Secondary | ICD-10-CM | POA: Diagnosis not present

## 2021-01-08 DIAGNOSIS — F17210 Nicotine dependence, cigarettes, uncomplicated: Secondary | ICD-10-CM | POA: Diagnosis not present

## 2021-01-08 DIAGNOSIS — I5022 Chronic systolic (congestive) heart failure: Secondary | ICD-10-CM | POA: Diagnosis not present

## 2021-01-08 DIAGNOSIS — R519 Headache, unspecified: Secondary | ICD-10-CM

## 2021-01-08 DIAGNOSIS — H66005 Acute suppurative otitis media without spontaneous rupture of ear drum, recurrent, left ear: Secondary | ICD-10-CM | POA: Diagnosis not present

## 2021-01-08 DIAGNOSIS — I110 Hypertensive heart disease with heart failure: Secondary | ICD-10-CM | POA: Insufficient documentation

## 2021-01-08 DIAGNOSIS — Z7901 Long term (current) use of anticoagulants: Secondary | ICD-10-CM | POA: Insufficient documentation

## 2021-01-08 DIAGNOSIS — E236 Other disorders of pituitary gland: Secondary | ICD-10-CM | POA: Diagnosis not present

## 2021-01-08 DIAGNOSIS — R102 Pelvic and perineal pain: Secondary | ICD-10-CM | POA: Diagnosis not present

## 2021-01-08 LAB — BASIC METABOLIC PANEL
Anion gap: 6 (ref 5–15)
BUN: 7 mg/dL (ref 6–20)
CO2: 28 mmol/L (ref 22–32)
Calcium: 9.1 mg/dL (ref 8.9–10.3)
Chloride: 103 mmol/L (ref 98–111)
Creatinine, Ser: 0.73 mg/dL (ref 0.44–1.00)
GFR, Estimated: >60 mL/min (ref >60)
Glucose, Bld: 100 mg/dL — ABNORMAL HIGH (ref 70–99)
Potassium: 4.2 mmol/L (ref 3.5–5.1)
Sodium: 137 mmol/L (ref 135–145)

## 2021-01-08 LAB — CBC WITH DIFFERENTIAL/PLATELET
Abs Immature Granulocytes: 0.07 10*3/uL (ref 0.00–0.07)
Basophils Absolute: 0.1 10*3/uL (ref 0.0–0.1)
Basophils Relative: 1 %
Eosinophils Absolute: 0.2 10*3/uL (ref 0.0–0.5)
Eosinophils Relative: 2 %
HCT: 39.4 % (ref 36.0–46.0)
Hemoglobin: 13.4 g/dL (ref 12.0–15.0)
Immature Granulocytes: 1 %
Lymphocytes Relative: 35 %
Lymphs Abs: 4.5 10*3/uL — ABNORMAL HIGH (ref 0.7–4.0)
MCH: 27.9 pg (ref 26.0–34.0)
MCHC: 34 g/dL (ref 30.0–36.0)
MCV: 82.1 fL (ref 80.0–100.0)
Monocytes Absolute: 1 10*3/uL (ref 0.1–1.0)
Monocytes Relative: 8 %
Neutro Abs: 7 10*3/uL (ref 1.7–7.7)
Neutrophils Relative %: 53 %
Platelets: 353 10*3/uL (ref 150–400)
RBC: 4.8 MIL/uL (ref 3.87–5.11)
RDW: 15.6 % — ABNORMAL HIGH (ref 11.5–15.5)
WBC: 12.9 10*3/uL — ABNORMAL HIGH (ref 4.0–10.5)
nRBC: 0 % (ref 0.0–0.2)

## 2021-01-08 LAB — I-STAT BETA HCG BLOOD, ED (MC, WL, AP ONLY): I-stat hCG, quantitative: <5 m[IU]/mL (ref <5)

## 2021-01-08 LAB — PROTIME-INR
INR: 1 (ref 0.8–1.2)
Prothrombin Time: 13 seconds (ref 11.4–15.2)

## 2021-01-08 MED ORDER — CEFPODOXIME PROXETIL 200 MG PO TABS: 200.0000 mg | ORAL_TABLET | Freq: Two times a day (BID) | ORAL | 0 refills | Status: AC

## 2021-01-08 MED ORDER — GADOBUTROL 1 MMOL/ML IV SOLN
10.0000 mL | Freq: Once | INTRAVENOUS | Status: AC | PRN
  Administered 2021-01-08: 10 mL via INTRAVENOUS

## 2021-01-08 MED ORDER — METOCLOPRAMIDE HCL 5 MG/ML IJ SOLN
10.0000 mg | Freq: Once | INTRAMUSCULAR | Status: AC
  Administered 2021-01-08: 10 mg via INTRAVENOUS
  Filled 2021-01-08: qty 2

## 2021-01-08 MED ORDER — DIPHENHYDRAMINE HCL 25 MG PO CAPS
50.0000 mg | ORAL_CAPSULE | Freq: Once | ORAL | Status: AC
  Administered 2021-01-08: 50 mg via ORAL
  Filled 2021-01-08: qty 2

## 2021-01-08 NOTE — Patient Instructions (Signed)
Thank you for coming to see me today. It was a pleasure.     I am concerned that your headache may be the beginning of the blood clot you had previously.Please go to ED for further evaluation of headache  Please follow-up with PCP in 1 week  If you have any questions or concerns, please do not hesitate to call the office at [phone].  Best,   Dana Allan, MD

## 2021-01-08 NOTE — Discharge Instructions (Signed)
Take antibiotics as prescribed.  Take the entire course, even if symptoms improve. Continue using Tylenol as needed for headache. Follow-up with your ear nose and throat doctor if your ear pain is not improving. Follow with your primary care doctor as needed for further evaluation of headaches. Return to the emergency room if you develop severe worsening headache, fevers, vision changes, slurred speech, numbness or weakness, or any new, worsening, concerning symptoms.  Of note, your INR was very low today at 1.  It is important that you follow-up with your primary care doctor or whoever manages your warfarin for dose adjustment.

## 2021-01-08 NOTE — ED Triage Notes (Signed)
Pt coming from PCP, complaint of persistent headache x2 weeks, hx of sinus thrombosis, PCP sent pt for rule out blood clot.

## 2021-01-08 NOTE — Progress Notes (Signed)
    SUBJECTIVE:   CHIEF COMPLAINT / HPI: headache and off balance for 2 weeks  Headache  Onset: 2 weeks Location: right side radiating to neck Quality: dull ache, pressure Frequency: constant Precipitating factors: none Prior treatment: Tylenol without relief  Associated Symptoms Nausea/vomiting: no  Photophobia/phonophobia: no  Tearing of eyes: no  Sinus pain/pressure: no  Family hx migraine: no  Personal stressors: yes  Relation to menstrual cycle: no   Red Flags Fever: no  Neck pain/stiffness: no  Vision/speech/swallow/hearing difficulty: no  Focal weakness/numbness: no  Altered mental status: no  Trauma: no  New type of headache: yes  Anticoagulant use: yes  H/o cancer/HIV/Pregnancy: no   PERTINENT  PMH / PSH:  Antiphospholipid syndrome  DVT Dual Venous Sinus Thrombosis Heart Failure HTN MI with Stent Placement  OBJECTIVE:   BP 124/72   Pulse 72   Ht 5\' 9"  (1.753 m)   Wt (!) 364 lb (165.1 kg)   SpO2 94%   BMI 53.75 kg/m    General: Alert, no acute distress Cardio: Normal S1 and S2, RRR, no r/m/g Pulm: CTAB, normal work of breathing Abdomen: Bowel sounds normal. Abdomen soft and non-tender.  Extremities: No peripheral edema.  Neuro: CN II: PERRL,  Fundoscopic exam: unable to visualize optic disc for papilledema  CN III, IV,VI: EOMI CV V: Normal sensation in V1, V2, V3 CVII: Symmetric smile and brow raise CN VIII: Normal hearing CN IX,X: Symmetric palate raise  CN XI: 5/5 shoulder shrug CN XII: Symmetric tongue protrusion  UE and LE strength 5/5 2+ UE and LE reflexes  Normal sensation in UE and LE bilaterally  No ataxia with finger to nose, normal heel to shin Negative Rhomberg   Heal to toe walking off balance   ASSESSMENT/PLAN:   Headache Concern for hypercoagulable state given intermittent use of Warfarin and not at therapeutic level.  History significant for Dual Sinus Venous thrombus, Antiphospholipid syndrome on Warfarin.  Could  consider Migraine given history but not same as previous migraines.   -Recommend go to ED for further evaluation for r/o TIA or recurrent clot -Patient will be transferred with staff     , MD [facility]

## 2021-01-08 NOTE — ED Provider Notes (Signed)
Emergency Medicine Provider Triage Evaluation Note  Angelica Brown , a 45 y.o. female  was evaluated in triage.  Pt complains of headache.  Review of Systems  Positive: Headache, blurry vision, weak Negative: Fever, neck stiffness, rash  Physical Exam  BP (!) 143/83 (BP Location: Left Arm)   Pulse 62   Temp 98.6 F (37 C) (Oral)   Resp 16   SpO2 97%  Gen:   Awake, no distress   Resp:  Normal effort  MSK:   Moves extremities without difficulty  Other:    Medical Decision Making  Medically screening exam initiated at 9:48 AM.  Appropriate orders placed.  Angelica Brown was informed that the remainder of the evaluation will be completed by another provider, this initial triage assessment does not replace that evaluation, and the importance of remaining in the ED until their evaluation is complete.  Pt with hx of dural venous sinus thrombosis currently on coumadin.  She has had recurrent L sided headache and blurry vision for >1 week.  Was seen by PCP today and sent here for further eval as they are concerning of potential reoccuring thrombosis.     [name], PA-C 01/08/21 3646    [name], MD 01/11/21 [phone]

## 2021-01-08 NOTE — Assessment & Plan Note (Signed)
Concern for hypercoagulable state given intermittent use of Warfarin and not at therapeutic level.  History significant for Dual Sinus Venous thrombus, Antiphospholipid syndrome on Warfarin.  Could consider Migraine given history but not same as previous migraines.   -Recommend go to ED for further evaluation for r/o TIA or recurrent clot -Patient will be transferred with staff

## 2021-01-08 NOTE — ED Provider Notes (Signed)
[facility] EMERGENCY DEPARTMENT Provider Note   CSN: [id number] Arrival date & time: 01/08/21  4696     History Chief Complaint  Patient presents with   Headache    Angelica Brown is a 45 y.o. female presenting for evaluation of HA.  Pt states she has had HA on the L side behind her ear x2 wks. She has her PCP, who was concerned for recurrent sinus thrombosis.  Patient is on warfarin, states she has been taking medication as prescribed but her INR has been low recently.  She states she is also having ear pain and feels bubbles in her ear, history of frequent ear infections since her initial sinus thrombosis.  She denies trauma or injury to the head.  She denies vision changes, slurred speech, dizziness, fever, nasal congestion, sore throat, cough, chest pain, shortness of breath, nausea vomit, abdominal pain, urinary symptoms, normal bowel movements.  She denies numbness or tingling.  No new medications.  She has been taking Tylenol for her head without improvement.  Has not taken anything else.  Nothing makes her symptoms better or worse.  Additional history obtained in chart review.  Patient with a history of ACS, CHF, CAD, GERD, dural venous sinus thrombosis, migraines, hypertension, sickle cell trait  HPI     Past Medical History:  Diagnosis Date   ACS (acute coronary syndrome) (HCC) 05/04/2018   CHOLELITHIASIS    Chronic systolic CHF (congestive heart failure) (HCC) 06/13/2016   Ischemic CM (inf MI 05/2018) >> Echo 05/04/2018 - Mod LVH, EF 40-45, inf-lat HK, PASP 47 (mild pul HTN)   Coronary artery disease    Dermatophytosis of nail    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    acute dural venous sinus thrombosis and right mastoid effusion/notes 05/28/2016   GERD (gastroesophageal reflux disease)    Gestational diabetes    "w/all 4 pregnancies"   Heart murmur    Hypertension    Metrorrhagia    Migraine    "at least twice/month" (05/28/2016)    Nystagmus 06/30/2016   OBESITY, MORBID    OBESITY, NOS    Pneumonia 11/2014   POSTURAL LIGHTHEADEDNESS    Sickle cell trait (HCC)    STEMI (ST elevation myocardial infarction) (HCC) 11/09/2017   in [place], [place] - see records in Care Everywhere   SYNCOPE    Throat pain    TOBACCO DEPENDENCE    URI    Urinary frequency     Patient Active Problem List   Diagnosis Date Noted   Tonsillolith 12/09/2020   Chronic anticoagulation 09/24/2020   Hypertension 05/27/2020   Lateral epicondylitis 04/30/2020   Prediabetes 11/27/2019   Pelvic pain 08/06/2019   OSA (obstructive sleep apnea) 08/06/2019   Left leg pain 03/22/2019   Acute left-sided low back pain without sciatica 11/21/2018   Left flank pain 11/14/2018   Hx of Inf MI (7/19 Tx with BMS to RCA; stent thrombosis 9/19 tx with DES to RCA; stent thrombosis 04/2018 tx with thrombectomy) 05/04/2018   Bradycardia 04/20/2018   History of MI (myocardial infarction) 04/20/2018   Antiphospholipid antibody syndrome (HCC) 11/01/2016   Seborrheic dermatitis 10/27/2016   Nystagmus 06/30/2016   Chronic systolic CHF (congestive heart failure) (HCC) 06/13/2016   Acute stress reaction 06/09/2016   Mastoiditis of right side 05/29/2016   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   Headache 05/26/2016   Family history of breast cancer 01/17/2013   Sickle cell trait (HCC)    Dizziness 08/29/2008   OBESITY,  MORBID 07/10/2008   TOBACCO DEPENDENCE 06/16/2006   METRORRHAGIA 06/16/2006    Past Surgical History:  Procedure Laterality Date   BARTHOLIN GLAND CYST EXCISION Bilateral 2004   CORONARY THROMBECTOMY N/A 05/04/2018   Procedure: Coronary Thrombectomy;  Surgeon: Lyn Records, MD;  Location[facility] INVASIVE CV LAB;  Service: Cardiovascular;  Laterality: N/A;   EXCISIONAL HEMORRHOIDECTOMY  2006   "removed a blood clot from one"   LEFT HEART CATH AND CORONARY ANGIOGRAPHY N/A 05/04/2018   Procedure: LEFT HEART CATH AND CORONARY ANGIOGRAPHY;   Surgeon: Lyn Records, MD;  Location: [facility] INVASIVE CV LAB;  Service: Cardiovascular;  Laterality: N/A;   TUBAL LIGATION  2007   WISDOM TOOTH EXTRACTION  1998     OB History     Gravida  5   Para  5   Term  4   Preterm  1   AB      Living  4      SAB      IAB      Ectopic      Multiple  1   Live Births              Family History  Problem Relation Age of Onset   Breast cancer Mother    Cancer Father        MELANOMA   Diabetes Father    Diabetes Paternal Grandmother    Diabetes Paternal Grandfather    Breast cancer Maternal Aunt    Breast cancer Paternal Aunt     Social History   Tobacco Use   Smoking status: Every Day    Packs/day: 0.15    Years: 18.00    Pack years: 2.70    Types: Cigarettes   Smokeless tobacco: Never  Vaping Use   Vaping Use: Never used  Substance Use Topics   Alcohol use: No   Drug use: No    Home Medications Prior to Admission medications   Medication Sig Start Date End Date Taking? Authorizing Provider  acetaminophen (TYLENOL) 500 MG tablet Take 500-1,000 mg by mouth every 6 (six) hours as needed for mild pain or headache.   Yes [provider]  cefpodoxime (VANTIN) 200 MG tablet Take 1 tablet (200 mg total) by mouth 2 (two) times daily for 7 days. 01/08/21 01/15/21 Yes Kiasha Bellin, PA-C  metoprolol tartrate (LOPRESSOR) 25 MG tablet TAKE 1/2 (ONE-HALF) TABLET BY MOUTH ONCE DAILY WITH BREAKFAST 01/18/20  Yes Latrelle Dodrill, MD  nitroGLYCERIN (NITROSTAT) 0.4 MG SL tablet Place 1 tablet (0.4 mg total) under the tongue every 5 (five) minutes x 3 doses as needed for chest pain. 12/09/20  Yes Latrelle Dodrill, MD  omeprazole (PRILOSEC) 40 MG capsule Take 40 mg by mouth 2 (two) times daily as needed (for GERD-like symptoms).   Yes [provider]  prasugrel (EFFIENT) 10 MG TABS tablet Take 1 tablet (10 mg total) by mouth daily. 11/28/20  Yes Latrelle Dodrill, MD  rosuvastatin (CRESTOR) 40 MG  tablet Take 1 tablet (40 mg total) by mouth daily. 11/24/20  Yes Latrelle Dodrill, MD  topiramate (TOPAMAX) 50 MG tablet Take 1 tablet (50 mg total) by mouth daily. 11/24/20  Yes Latrelle Dodrill, MD  warfarin (COUMADIN) 5 MG tablet Take 1.5 tablets (7.5 mg total) by mouth daily. 11/28/20  Yes Latrelle Dodrill, MD  XARELTO 20 MG TABS tablet Take 1 tablet by mouth once daily 07/09/20 07/11/20  Serena Croissant, MD    Allergies  Ampicillin, Penicillins, Strawberry extract, Cephalexin, and Prednisone  Review of Systems   Review of Systems  HENT:  Positive for ear pain.   Neurological:  Positive for numbness.  All other systems reviewed and are negative.  Physical Exam Updated Vital Signs BP 120/70   Pulse 64   Temp 98.6 F (37 C) (Oral)   Resp 20   SpO2 98%   Physical Exam Vitals and nursing note reviewed.  Constitutional:      General: She is not in acute distress.    Appearance: Normal appearance.  HENT:     Head: Normocephalic and atraumatic.     Right Ear: Tympanic membrane, ear canal and external ear normal.     Left Ear: Tympanic membrane is erythematous.     Ears:     Comments: Left TM erythematous. No ttp over the mastoid Eyes:     Extraocular Movements: Extraocular movements intact.     Conjunctiva/sclera: Conjunctivae normal.     Pupils: Pupils are equal, round, and reactive to light.  Cardiovascular:     Rate and Rhythm: Normal rate and regular rhythm.     Pulses: Normal pulses.  Pulmonary:     Effort: Pulmonary effort is normal. No respiratory distress.     Breath sounds: Normal breath sounds. No wheezing.     Comments: Speaking in full sentences.  Clear lung sounds in all fields. Abdominal:     General: There is no distension.     Palpations: Abdomen is soft. There is no mass.     Tenderness: There is no abdominal tenderness. There is no guarding or rebound.  Musculoskeletal:        General: Normal range of motion.     Cervical back: Normal range of  motion and neck supple.  Skin:    General: Skin is warm and dry.     Capillary Refill: Capillary refill takes less than 2 seconds.  Neurological:     Mental Status: She is alert and oriented to person, place, and time.     GCS: GCS eye subscore is 4. GCS verbal subscore is 5. GCS motor subscore is 6.     Cranial Nerves: Cranial nerves are intact.     Sensory: Sensation is intact.     Motor: Motor function is intact.     Coordination: Coordination is intact.     Gait: Gait is intact.     Comments: CN intact.  Nose to finger intact.  Fine movement and coordiantion intact.  Negative pronator drift.  Gait normal.  Psychiatric:        Mood and Affect: Mood and affect normal.        Speech: Speech normal.        Behavior: Behavior normal.    ED Results / Procedures / Treatments   Labs (all labs ordered are listed, but only abnormal results are displayed) Labs Reviewed  BASIC METABOLIC PANEL - Abnormal; Notable for the following components:      Result Value   Glucose, Bld 100 (*)    All other components within normal limits  CBC WITH DIFFERENTIAL/PLATELET - Abnormal; Notable for the following components:   WBC 12.9 (*)    RDW 15.6 (*)    Lymphs Abs 4.5 (*)    All other components within normal limits  PROTIME-INR  I-STAT BETA HCG BLOOD, ED ([facility], [facility], [facility] ONLY)    EKG None  Radiology MR Brain W and Wo Contrast  Result Date: 01/08/2021 CLINICAL DATA:  Dural  venous sinus thrombosis suspected (Ped 0-18y) EXAM: MRI HEAD WITHOUT AND WITH CONTRAST MR VENOGRAM HEAD WITHOUT AND WITH CONTRAST TECHNIQUE: Multiplanar, multi-echo pulse sequences of the brain and surrounding structures were acquired without and with intravenous contrast. Angiographic images of the intracranial venous structures were acquired using MRV technique without and with intravenous contrast. CONTRAST:  58mL GADAVIST GADOBUTROL 1 MMOL/ML IV SOLN COMPARISON:  11/29/2019. FINDINGS: MRI HEAD WITHOUT AND WITH CONTRAST Mildly  motion limited study.  Within this limitation: Brain: No acute infarction, hemorrhage, hydrocephalus, extra-axial collection or mass lesion. No pathologic intracranial enhancement. Partially empty sella. No abnormal enhancement. Vascular: Major arterial flow voids are maintained at the skull base. Dural venous sinuses are evaluated below. Skull and upper cervical spine: Chronic diffuse T1 hypointensity of the marrow without focal marrow replacing lesion. Sinuses/Orbits: Mild paranasal sinus mucosal thickening. No acute orbital findings. Other: No mastoid effusions. MR VENOGRAM HEAD WITHOUT AND WITH CONTRAST There is no evidence of dural venous sinus or deep cerebral vein thrombosis. There is a short-segment area of apparent loss of flow related signal on the time-of-flight MRV; however, this area and enhances normally on the postcontrast imaging and therefore likely is artifactual. Narrowing of the distal left transverse sinus, chronic. IMPRESSION: 1. No evidence of acute intracranial abnormality on this mildly motion limited study. 2. No evidence of dural venous sinus thrombosis 3. Similar partially empty sella and chronically narrowed distal transverse sinuses, nonspecific findings that can be associated with idiopathic intracranial hypertension in the correct clinical setting. Electronically Signed   By: Feliberto Harts M.D.   On: 01/08/2021 12:27   MR MRV HEAD W WO CONTRAST  Result Date: 01/08/2021 CLINICAL DATA:  Dural venous sinus thrombosis suspected (Ped 0-18y) EXAM: MRI HEAD WITHOUT AND WITH CONTRAST MR VENOGRAM HEAD WITHOUT AND WITH CONTRAST TECHNIQUE: Multiplanar, multi-echo pulse sequences of the brain and surrounding structures were acquired without and with intravenous contrast. Angiographic images of the intracranial venous structures were acquired using MRV technique without and with intravenous contrast. CONTRAST:  31mL GADAVIST GADOBUTROL 1 MMOL/ML IV SOLN COMPARISON:  11/29/2019. FINDINGS:  MRI HEAD WITHOUT AND WITH CONTRAST Mildly motion limited study.  Within this limitation: Brain: No acute infarction, hemorrhage, hydrocephalus, extra-axial collection or mass lesion. No pathologic intracranial enhancement. Partially empty sella. No abnormal enhancement. Vascular: Major arterial flow voids are maintained at the skull base. Dural venous sinuses are evaluated below. Skull and upper cervical spine: Chronic diffuse T1 hypointensity of the marrow without focal marrow replacing lesion. Sinuses/Orbits: Mild paranasal sinus mucosal thickening. No acute orbital findings. Other: No mastoid effusions. MR VENOGRAM HEAD WITHOUT AND WITH CONTRAST There is no evidence of dural venous sinus or deep cerebral vein thrombosis. There is a short-segment area of apparent loss of flow related signal on the time-of-flight MRV; however, this area and enhances normally on the postcontrast imaging and therefore likely is artifactual. Narrowing of the distal left transverse sinus, chronic. IMPRESSION: 1. No evidence of acute intracranial abnormality on this mildly motion limited study. 2. No evidence of dural venous sinus thrombosis 3. Similar partially empty sella and chronically narrowed distal transverse sinuses, nonspecific findings that can be associated with idiopathic intracranial hypertension in the correct clinical setting. Electronically Signed   By: Feliberto Harts M.D.   On: 01/08/2021 12:27    Procedures Procedures   Medications Ordered in ED Medications  gadobutrol (GADAVIST) 1 MMOL/ML injection 10 mL (10 mLs Intravenous Contrast Given 01/08/21 1211)  metoCLOPramide (REGLAN) injection 10 mg (10 mg Intravenous Given 01/08/21  1350)  diphenhydrAMINE (BENADRYL) capsule 50 mg (50 mg Oral Given 01/08/21 1349)    ED Course  I have reviewed the triage vital signs and the nursing notes.  Pertinent labs & imaging results that were available during my care of the patient were reviewed by me and considered in my  medical decision making (see chart for details).    MDM Rules/Calculators/A&P                           Patient presenting for evaluation of 2-week history of headache.  On exam, patient peers nontoxic.  No neurologic deficits.  However she has a concerning history of previous sinus thrombosis.  Labs and MRI/MRV ordered from triage.  Labs interpreted by me, overall reassuring.  Nonspecific minimal leukocytosis.  Otherwise labs are normal.  MRI of the brain and MRV negative for acute findings.  Shows partially empty sella turcica which could be a sign of IP, however per chart review, this is not new for her and has been present for over a year.  With no or acute neurologic deficits, do not believe she needs further imaging or work-up for IP.  Will give headache cocktail.  On my exam, patient does have a erythematous TM on the left side, this week contributing to her pain.  She reports frequent history of ear infections due to her previous sinus thrombosis, states this feels similar to previous ear infections.  Discussed antibiotic options with pharmacy due to patient's allergies and warfarin, recommend cefpodoxime.  Discussed findings and plan with patient, who is agreeable.  At this time, patient appears safe for discharge.  Return precautions given.  Patient states she understands and agrees to plan.   Final Clinical Impression(s) / ED Diagnoses Final diagnoses:  Recurrent acute suppurative otitis media without spontaneous rupture of left tympanic membrane  Nonintractable headache, unspecified chronicity pattern, unspecified headache type  Subtherapeutic international normalized ratio (INR)    Rx / DC Orders ED Discharge Orders          Ordered    cefpodoxime (VANTIN) 200 MG tablet  2 times daily        01/08/21 1445             Javian Nudd, PA-C 01/08/21 1455    Horton, Clabe Seal, DO 01/08/21 1516

## 2021-01-11 ENCOUNTER — Other Ambulatory Visit: Payer: Self-pay

## 2021-01-11 ENCOUNTER — Inpatient Hospital Stay (HOSPITAL_BASED_OUTPATIENT_CLINIC_OR_DEPARTMENT_OTHER)
Admission: EM | Admit: 2021-01-11 | Discharge: 2021-01-13 | DRG: 176 | Disposition: A | Discharge status: Home / Self Care | Payer: Federal, State, Local not specified - PPO | Attending: Internal Medicine | Admitting: Internal Medicine

## 2021-01-11 ENCOUNTER — Encounter (HOSPITAL_BASED_OUTPATIENT_CLINIC_OR_DEPARTMENT_OTHER): Payer: Self-pay | Admitting: Emergency Medicine

## 2021-01-11 ENCOUNTER — Emergency Department (HOSPITAL_BASED_OUTPATIENT_CLINIC_OR_DEPARTMENT_OTHER): Payer: Federal, State, Local not specified - PPO

## 2021-01-11 DIAGNOSIS — R0602 Shortness of breath: Secondary | ICD-10-CM | POA: Diagnosis not present

## 2021-01-11 DIAGNOSIS — F17210 Nicotine dependence, cigarettes, uncomplicated: Secondary | ICD-10-CM | POA: Diagnosis not present

## 2021-01-11 DIAGNOSIS — Z6841 Body Mass Index (BMI) 40.0 and over, adult: Secondary | ICD-10-CM

## 2021-01-11 DIAGNOSIS — I2609 Other pulmonary embolism with acute cor pulmonale: Secondary | ICD-10-CM | POA: Diagnosis not present

## 2021-01-11 DIAGNOSIS — I110 Hypertensive heart disease with heart failure: Secondary | ICD-10-CM | POA: Diagnosis present

## 2021-01-11 DIAGNOSIS — Z91018 Allergy to other foods: Secondary | ICD-10-CM

## 2021-01-11 DIAGNOSIS — D573 Sickle-cell trait: Secondary | ICD-10-CM | POA: Diagnosis present

## 2021-01-11 DIAGNOSIS — I2510 Atherosclerotic heart disease of native coronary artery without angina pectoris: Secondary | ICD-10-CM | POA: Diagnosis present

## 2021-01-11 DIAGNOSIS — D6861 Antiphospholipid syndrome: Secondary | ICD-10-CM | POA: Diagnosis not present

## 2021-01-11 DIAGNOSIS — K219 Gastro-esophageal reflux disease without esophagitis: Secondary | ICD-10-CM | POA: Diagnosis not present

## 2021-01-11 DIAGNOSIS — G08 Intracranial and intraspinal phlebitis and thrombophlebitis: Secondary | ICD-10-CM

## 2021-01-11 DIAGNOSIS — I2699 Other pulmonary embolism without acute cor pulmonale: Secondary | ICD-10-CM | POA: Diagnosis present

## 2021-01-11 DIAGNOSIS — Z79899 Other long term (current) drug therapy: Secondary | ICD-10-CM

## 2021-01-11 DIAGNOSIS — I5022 Chronic systolic (congestive) heart failure: Secondary | ICD-10-CM | POA: Diagnosis not present

## 2021-01-11 DIAGNOSIS — Z7901 Long term (current) use of anticoagulants: Secondary | ICD-10-CM

## 2021-01-11 DIAGNOSIS — I2694 Multiple subsegmental pulmonary emboli without acute cor pulmonale: Principal | ICD-10-CM | POA: Diagnosis present

## 2021-01-11 DIAGNOSIS — H6690 Otitis media, unspecified, unspecified ear: Secondary | ICD-10-CM | POA: Diagnosis present

## 2021-01-11 DIAGNOSIS — Z20822 Contact with and (suspected) exposure to covid-19: Secondary | ICD-10-CM | POA: Diagnosis not present

## 2021-01-11 DIAGNOSIS — F17200 Nicotine dependence, unspecified, uncomplicated: Secondary | ICD-10-CM | POA: Diagnosis present

## 2021-01-11 DIAGNOSIS — Z888 Allergy status to other drugs, medicaments and biological substances status: Secondary | ICD-10-CM

## 2021-01-11 DIAGNOSIS — G4733 Obstructive sleep apnea (adult) (pediatric): Secondary | ICD-10-CM | POA: Diagnosis present

## 2021-01-11 DIAGNOSIS — I255 Ischemic cardiomyopathy: Secondary | ICD-10-CM | POA: Diagnosis not present

## 2021-01-11 DIAGNOSIS — E785 Hyperlipidemia, unspecified: Secondary | ICD-10-CM | POA: Diagnosis present

## 2021-01-11 DIAGNOSIS — Z880 Allergy status to penicillin: Secondary | ICD-10-CM

## 2021-01-11 DIAGNOSIS — Z9114 Patient's other noncompliance with medication regimen: Secondary | ICD-10-CM

## 2021-01-11 DIAGNOSIS — Z881 Allergy status to other antibiotic agents status: Secondary | ICD-10-CM

## 2021-01-11 DIAGNOSIS — I252 Old myocardial infarction: Secondary | ICD-10-CM

## 2021-01-11 DIAGNOSIS — I10 Essential (primary) hypertension: Secondary | ICD-10-CM | POA: Diagnosis present

## 2021-01-11 LAB — CBC WITH DIFFERENTIAL/PLATELET
Abs Immature Granulocytes: 0.06 10*3/uL (ref 0.00–0.07)
Basophils Absolute: 0.1 10*3/uL (ref 0.0–0.1)
Basophils Relative: 1 %
Eosinophils Absolute: 0.3 10*3/uL (ref 0.0–0.5)
Eosinophils Relative: 2 %
HCT: 39.2 % (ref 36.0–46.0)
Hemoglobin: 13.8 g/dL (ref 12.0–15.0)
Immature Granulocytes: 0 %
Lymphocytes Relative: 28 %
Lymphs Abs: 4 10*3/uL (ref 0.7–4.0)
MCH: 28.2 pg (ref 26.0–34.0)
MCHC: 35.2 g/dL (ref 30.0–36.0)
MCV: 80.2 fL (ref 80.0–100.0)
Monocytes Absolute: 1.4 10*3/uL — ABNORMAL HIGH (ref 0.1–1.0)
Monocytes Relative: 10 %
Neutro Abs: 8.7 10*3/uL — ABNORMAL HIGH (ref 1.7–7.7)
Neutrophils Relative %: 59 %
Platelets: 332 10*3/uL (ref 150–400)
RBC: 4.89 MIL/uL (ref 3.87–5.11)
RDW: 15.6 % — ABNORMAL HIGH (ref 11.5–15.5)
WBC: 14.6 10*3/uL — ABNORMAL HIGH (ref 4.0–10.5)
nRBC: 0 % (ref 0.0–0.2)

## 2021-01-11 LAB — PROTIME-INR
INR: 1.3 — ABNORMAL HIGH (ref 0.8–1.2)
Prothrombin Time: 16 seconds — ABNORMAL HIGH (ref 11.4–15.2)

## 2021-01-11 NOTE — ED Notes (Signed)
Pt reports taking antibiotics since Thursday for ear infection which was causing balance issues.  Pt denies dizziness at present.

## 2021-01-11 NOTE — ED Triage Notes (Signed)
Pt reports SOB and lower right calf pain.  Pt h/o clotting disorder and states lump in her leg feels hot and tender similar to past blood clots she's had.  Pt with h/o MI r/t clots.

## 2021-01-12 ENCOUNTER — Encounter (HOSPITAL_BASED_OUTPATIENT_CLINIC_OR_DEPARTMENT_OTHER): Payer: Self-pay | Admitting: Radiology

## 2021-01-12 ENCOUNTER — Emergency Department (HOSPITAL_BASED_OUTPATIENT_CLINIC_OR_DEPARTMENT_OTHER): Payer: Federal, State, Local not specified - PPO

## 2021-01-12 ENCOUNTER — Other Ambulatory Visit: Payer: Self-pay

## 2021-01-12 DIAGNOSIS — Z881 Allergy status to other antibiotic agents status: Secondary | ICD-10-CM | POA: Diagnosis not present

## 2021-01-12 DIAGNOSIS — Z880 Allergy status to penicillin: Secondary | ICD-10-CM | POA: Diagnosis not present

## 2021-01-12 DIAGNOSIS — I5022 Chronic systolic (congestive) heart failure: Secondary | ICD-10-CM | POA: Diagnosis not present

## 2021-01-12 DIAGNOSIS — Z9114 Patient's other noncompliance with medication regimen: Secondary | ICD-10-CM | POA: Diagnosis not present

## 2021-01-12 DIAGNOSIS — Z6841 Body Mass Index (BMI) 40.0 and over, adult: Secondary | ICD-10-CM | POA: Diagnosis not present

## 2021-01-12 DIAGNOSIS — Z79899 Other long term (current) drug therapy: Secondary | ICD-10-CM | POA: Diagnosis not present

## 2021-01-12 DIAGNOSIS — R0602 Shortness of breath: Secondary | ICD-10-CM | POA: Diagnosis not present

## 2021-01-12 DIAGNOSIS — I2694 Multiple subsegmental pulmonary emboli without acute cor pulmonale: Secondary | ICD-10-CM | POA: Diagnosis not present

## 2021-01-12 DIAGNOSIS — D6861 Antiphospholipid syndrome: Secondary | ICD-10-CM | POA: Diagnosis not present

## 2021-01-12 DIAGNOSIS — K219 Gastro-esophageal reflux disease without esophagitis: Secondary | ICD-10-CM | POA: Diagnosis present

## 2021-01-12 DIAGNOSIS — Z7901 Long term (current) use of anticoagulants: Secondary | ICD-10-CM | POA: Diagnosis not present

## 2021-01-12 DIAGNOSIS — E785 Hyperlipidemia, unspecified: Secondary | ICD-10-CM | POA: Diagnosis present

## 2021-01-12 DIAGNOSIS — I255 Ischemic cardiomyopathy: Secondary | ICD-10-CM | POA: Diagnosis present

## 2021-01-12 DIAGNOSIS — Z888 Allergy status to other drugs, medicaments and biological substances status: Secondary | ICD-10-CM | POA: Diagnosis not present

## 2021-01-12 DIAGNOSIS — I2609 Other pulmonary embolism with acute cor pulmonale: Secondary | ICD-10-CM | POA: Diagnosis not present

## 2021-01-12 DIAGNOSIS — G4733 Obstructive sleep apnea (adult) (pediatric): Secondary | ICD-10-CM | POA: Diagnosis present

## 2021-01-12 DIAGNOSIS — I252 Old myocardial infarction: Secondary | ICD-10-CM | POA: Diagnosis not present

## 2021-01-12 DIAGNOSIS — Z20822 Contact with and (suspected) exposure to covid-19: Secondary | ICD-10-CM | POA: Diagnosis not present

## 2021-01-12 DIAGNOSIS — Z91018 Allergy to other foods: Secondary | ICD-10-CM | POA: Diagnosis not present

## 2021-01-12 DIAGNOSIS — I2699 Other pulmonary embolism without acute cor pulmonale: Secondary | ICD-10-CM | POA: Diagnosis not present

## 2021-01-12 DIAGNOSIS — I110 Hypertensive heart disease with heart failure: Secondary | ICD-10-CM | POA: Diagnosis present

## 2021-01-12 DIAGNOSIS — I2510 Atherosclerotic heart disease of native coronary artery without angina pectoris: Secondary | ICD-10-CM | POA: Diagnosis present

## 2021-01-12 DIAGNOSIS — F17210 Nicotine dependence, cigarettes, uncomplicated: Secondary | ICD-10-CM | POA: Diagnosis present

## 2021-01-12 DIAGNOSIS — D573 Sickle-cell trait: Secondary | ICD-10-CM | POA: Diagnosis present

## 2021-01-12 DIAGNOSIS — H6690 Otitis media, unspecified, unspecified ear: Secondary | ICD-10-CM | POA: Diagnosis present

## 2021-01-12 LAB — RESP PANEL BY RT-PCR (FLU A&B, COVID) ARPGX2
Influenza A by PCR: NEGATIVE
Influenza B by PCR: NEGATIVE
SARS Coronavirus 2 by RT PCR: NEGATIVE

## 2021-01-12 LAB — BASIC METABOLIC PANEL
Anion gap: 9 (ref 5–15)
BUN: 14 mg/dL (ref 6–20)
CO2: 24 mmol/L (ref 22–32)
Calcium: 9.6 mg/dL (ref 8.9–10.3)
Chloride: 107 mmol/L (ref 98–111)
Creatinine, Ser: 0.74 mg/dL (ref 0.44–1.00)
GFR, Estimated: >60 mL/min (ref >60)
Glucose, Bld: 95 mg/dL (ref 70–99)
Potassium: 3.7 mmol/L (ref 3.5–5.1)
Sodium: 140 mmol/L (ref 135–145)

## 2021-01-12 LAB — HEPARIN LEVEL (UNFRACTIONATED)
Heparin Unfractionated: 0.13 IU/mL — ABNORMAL LOW (ref 0.30–0.70)
Heparin Unfractionated: 0.29 IU/mL — ABNORMAL LOW (ref 0.30–0.70)

## 2021-01-12 MED ORDER — SODIUM CHLORIDE 0.9% FLUSH
3.0000 mL | Freq: Two times a day (BID) | INTRAVENOUS | Status: DC
  Administered 2021-01-12: 3 mL via INTRAVENOUS

## 2021-01-12 MED ORDER — HEPARIN BOLUS VIA INFUSION
1500.0000 [IU] | INTRAVENOUS | Status: AC
  Administered 2021-01-12: 1500 [IU] via INTRAVENOUS
  Filled 2021-01-12: qty 1500

## 2021-01-12 MED ORDER — POLYETHYLENE GLYCOL 3350 17 G PO PACK: 17.0000 g | PACK | Freq: Every day | ORAL | Status: DC | PRN

## 2021-01-12 MED ORDER — TOPIRAMATE 25 MG PO TABS
50.0000 mg | ORAL_TABLET | Freq: Every day | ORAL | Status: DC
  Administered 2021-01-12: 50 mg via ORAL
  Filled 2021-01-12: qty 2

## 2021-01-12 MED ORDER — ACETAMINOPHEN 325 MG PO TABS: 650.0000 mg | ORAL_TABLET | Freq: Four times a day (QID) | ORAL | Status: DC | PRN

## 2021-01-12 MED ORDER — HEPARIN BOLUS VIA INFUSION
5000.0000 [IU] | Freq: Once | INTRAVENOUS | Status: AC
  Administered 2021-01-12: 5000 [IU] via INTRAVENOUS

## 2021-01-12 MED ORDER — ACETAMINOPHEN 650 MG RE SUPP: 650.0000 mg | Freq: Four times a day (QID) | RECTAL | Status: DC | PRN

## 2021-01-12 MED ORDER — DOCUSATE SODIUM 100 MG PO CAPS
100.0000 mg | ORAL_CAPSULE | Freq: Two times a day (BID) | ORAL | Status: DC
  Administered 2021-01-12: 100 mg via ORAL
  Filled 2021-01-12 (×3): qty 1

## 2021-01-12 MED ORDER — HYDROCODONE-ACETAMINOPHEN 5-325 MG PO TABS: 1.0000 | ORAL_TABLET | ORAL | Status: DC | PRN

## 2021-01-12 MED ORDER — HYDRALAZINE HCL 20 MG/ML IJ SOLN: 5.0000 mg | INTRAMUSCULAR | Status: DC | PRN

## 2021-01-12 MED ORDER — HEPARIN BOLUS VIA INFUSION
3200.0000 [IU] | Freq: Once | INTRAVENOUS | Status: AC
  Administered 2021-01-12: 3200 [IU] via INTRAVENOUS
  Filled 2021-01-12: qty 3200

## 2021-01-12 MED ORDER — PRASUGREL HCL 10 MG PO TABS
10.0000 mg | ORAL_TABLET | Freq: Every day | ORAL | Status: DC
  Administered 2021-01-12: 10 mg via ORAL
  Filled 2021-01-12: qty 1

## 2021-01-12 MED ORDER — PANTOPRAZOLE SODIUM 40 MG PO TBEC
40.0000 mg | DELAYED_RELEASE_TABLET | Freq: Every day | ORAL | Status: DC
  Administered 2021-01-12: 40 mg via ORAL
  Filled 2021-01-12: qty 1

## 2021-01-12 MED ORDER — HEPARIN (PORCINE) 25000 UT/250ML-% IV SOLN: 1500.0000 [IU]/h | INTRAVENOUS | Status: DC

## 2021-01-12 MED ORDER — CEFPODOXIME PROXETIL 200 MG PO TABS
200.0000 mg | ORAL_TABLET | Freq: Two times a day (BID) | ORAL | Status: DC
  Administered 2021-01-12 – 2021-01-13 (×3): 200 mg via ORAL
  Filled 2021-01-12 (×3): qty 1

## 2021-01-12 MED ORDER — ONDANSETRON HCL 4 MG/2ML IJ SOLN: 4.0000 mg | Freq: Four times a day (QID) | INTRAMUSCULAR | Status: DC | PRN

## 2021-01-12 MED ORDER — ROSUVASTATIN CALCIUM 20 MG PO TABS
40.0000 mg | ORAL_TABLET | Freq: Every day | ORAL | Status: DC
  Administered 2021-01-12: 40 mg via ORAL
  Filled 2021-01-12: qty 2

## 2021-01-12 MED ORDER — CEFDINIR 300 MG PO CAPS: 600.0000 mg | ORAL_CAPSULE | Freq: Every day | ORAL | Status: DC

## 2021-01-12 MED ORDER — HEPARIN (PORCINE) 25000 UT/250ML-% IV SOLN
2100.0000 [IU]/h | INTRAVENOUS | Status: DC
  Administered 2021-01-12 – 2021-01-13 (×2): 2100 [IU]/h via INTRAVENOUS
  Filled 2021-01-12: qty 250

## 2021-01-12 MED ORDER — BISACODYL 5 MG PO TBEC: 5.0000 mg | DELAYED_RELEASE_TABLET | Freq: Every day | ORAL | Status: DC | PRN

## 2021-01-12 MED ORDER — HEPARIN (PORCINE) 25000 UT/250ML-% IV SOLN
1900.0000 [IU]/h | INTRAVENOUS | Status: DC
  Administered 2021-01-12: 1900 [IU]/h via INTRAVENOUS
  Administered 2021-01-12: 1500 [IU]/h via INTRAVENOUS
  Filled 2021-01-12 (×2): qty 250

## 2021-01-12 MED ORDER — ONDANSETRON HCL 4 MG PO TABS: 4.0000 mg | ORAL_TABLET | Freq: Four times a day (QID) | ORAL | Status: DC | PRN

## 2021-01-12 MED ORDER — HEPARIN BOLUS VIA INFUSION: 5000.0000 [IU] | Freq: Once | INTRAVENOUS | Status: DC

## 2021-01-12 MED ORDER — IOHEXOL 350 MG/ML SOLN
100.0000 mL | Freq: Once | INTRAVENOUS | Status: AC | PRN
  Administered 2021-01-12: 100 mL via INTRAVENOUS

## 2021-01-12 NOTE — Progress Notes (Signed)
Paged by ED provider about possible patient transfer to [facility]. Stated plan is to go through hospitalist  for possible admission as we are unable to direct admit. I am unable to assess this patient (currently at [facility]). Default to the judgement of the ED provider.  Ivanna Kocak Autry-Lott, DO 01/12/2021, 2:45 AM PGY-3, [facility]

## 2021-01-12 NOTE — Progress Notes (Signed)
ANTICOAGULATION CONSULT NOTE - Initial Consult  Pharmacy Consult for Heparin (holding warfarin) Indication: New onset PE, history of DVT and dural venous sinus thrombosis  Allergies  Allergen Reactions   Ampicillin Anaphylaxis, Shortness Of Breath and Swelling    Throat swells   Penicillins Anaphylaxis and Shortness Of Breath    Throat swelling Has patient had a PCN reaction causing immediate rash, facial/tongue/throat swelling, SOB or lightheadedness with hypotension: Yes Has patient had a PCN reaction causing severe rash involving mucus membranes or skin necrosis: No Has patient had a PCN reaction that required hospitalization: No Has patient had a PCN reaction occurring within the last 10 years: Yes If all of the above answers are "NO", then may proceed with Cephalosporin use.    Strawberry Extract Anaphylaxis, Hives, Itching and Swelling   Cephalexin Hives, Itching and Swelling   Prednisone Nausea And Vomiting, Rash and Other (See Comments)    Cramping in legs, could barely walk     Patient Measurements: Height: 5\' 9"  (175.3 cm) Weight: (!) 165.1 kg (364 lb) IBW/kg (Calculated) : 66.2 Heparin Dosing Weight: ~108  Vital Signs: Temp: 98.2 F (36.8 C) (09/25 2240) BP: 162/102 (09/26 0400) Pulse Rate: 38 (09/26 0400)  Labs: Recent Labs    01/11/21 2321  HGB 13.8  HCT 39.2  PLT 332  LABPROT 16.0*  INR 1.3*  CREATININE 0.74    Estimated Creatinine Clearance: 151.4 mL/min (by C-G formula based on SCr of 0.74 mg/dL).   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    acute dural venous sinus thrombosis and right mastoid  effusion/notes 05/28/2016   GERD (gastroesophageal reflux disease)    Gestational diabetes    "w/all 4 pregnancies"   Heart murmur    Hypertension    Metrorrhagia    Migraine    "at least twice/month" (05/28/2016)   Nystagmus 06/30/2016   OBESITY, MORBID    OBESITY, NOS    Pneumonia 11/2014   POSTURAL LIGHTHEADEDNESS    Sickle cell trait (HCC)    STEMI (ST elevation myocardial infarction) (HCC) 11/09/2017   in 11/11/2017, [place] - see records in Care Everywhere   SYNCOPE    Throat pain    TOBACCO DEPENDENCE    URI    Urinary frequency     Assessment: 45 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 She is found to have likely new onset PE per CT. Her INR is sub-therapeutic at 1.3. CBC/renal function good.    Goal of Therapy:  Heparin level 0.3-0.7 units/ml Monitor platelets by anticoagulation protocol: Yes   Plan:  Heparin 5000 units BOLUS Start heparin drip at 1500 units/hr 1000 Heparin level Daily CBC/Heparin level Monitor for bleeding  56, PharmD, BCPS Clinical Pharmacist Phone: [phone]

## 2021-01-12 NOTE — Progress Notes (Signed)
ANTICOAGULATION CONSULT NOTE - Initial Consult  Pharmacy Consult for Heparin (holding warfarin) Indication: New onset PE, history of DVT and dural venous sinus thrombosis  Allergies  Allergen Reactions   Ampicillin Anaphylaxis, Shortness Of Breath and Swelling    Throat swells   Penicillins Anaphylaxis and Shortness Of Breath    Throat swelling Has patient had a PCN reaction causing immediate rash, facial/tongue/throat swelling, SOB or lightheadedness with hypotension: Yes Has patient had a PCN reaction causing severe rash involving mucus membranes or skin necrosis: No Has patient had a PCN reaction that required hospitalization: No Has patient had a PCN reaction occurring within the last 10 years: Yes If all of the above answers are "NO", then may proceed with Cephalosporin use.    Strawberry Extract Anaphylaxis, Hives, Itching and Swelling   Cephalexin Hives, Itching and Swelling   Prednisone Nausea And Vomiting, Rash and Other (See Comments)    Cramping in legs, could barely walk     Patient Measurements: Height: 5\' 9"  (175.3 cm) Weight: (!) 165.1 kg (364 lb) IBW/kg (Calculated) : 66.2 Heparin Dosing Weight: ~108  Vital Signs: Temp: 98.4 F (36.9 C) (09/26 0641) Temp Source: Oral (09/26 0641) BP: 116/61 (09/26 0641) Pulse Rate: 66 (09/26 0641)  Labs: Recent Labs    01/11/21 2321 01/12/21 0922  HGB 13.8  --   HCT 39.2  --   PLT 332  --   LABPROT 16.0*  --   INR 1.3*  --   HEPARINUNFRC  --  0.13*  CREATININE 0.74  --      Estimated Creatinine Clearance: 151.4 mL/min (by C-G formula based on SCr of 0.74 mg/dL).   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    acute dural venous sinus thrombosis and right mastoid effusion/notes 05/28/2016   GERD (gastroesophageal reflux disease)    Gestational diabetes    "w/all 4 pregnancies"   Heart murmur    Hypertension    Metrorrhagia    Migraine    "at least twice/month" (05/28/2016)   Nystagmus 06/30/2016   OBESITY, MORBID    POSTURAL LIGHTHEADEDNESS    Sickle cell trait (HCC)    STEMI (ST elevation myocardial infarction) (HCC) 11/09/2017   in 11/11/2017, [place] - see records in Care Everywhere   SYNCOPE    Throat pain    TOBACCO DEPENDENCE     Assessment: 45 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 She is found to have likely new onset PE per CT. Her INR is sub-therapeutic at 1.3. CBC/renal function good.   Today, 01/12/21  HL is subtherapeutic at 0.13 No bleeding or infusion related issues per RN No report of infusion being paused   Goal of Therapy:  Heparin level 0.3-0.7 units/ml Monitor platelets by anticoagulation protocol: Yes   Plan:  Heparin 5000 units BOLUS Start heparin drip at 1500 units/hr 1000 Heparin level Daily CBC/Heparin level Monitor for bleeding  01/14/21  01/12/21 10:05 AM

## 2021-01-12 NOTE — Progress Notes (Signed)
ANTICOAGULATION CONSULT NOTE  Pharmacy Consult for Heparin (holding warfarin) Indication: New onset PE, history of DVT and dural venous sinus thrombosis  Allergies  Allergen Reactions   Ampicillin Anaphylaxis, Shortness Of Breath and Swelling    Throat swells   Penicillins Anaphylaxis and Shortness Of Breath    Throat swelling Has patient had a PCN reaction causing immediate rash, facial/tongue/throat swelling, SOB or lightheadedness with hypotension: Yes Has patient had a PCN reaction causing severe rash involving mucus membranes or skin necrosis: No Has patient had a PCN reaction that required hospitalization: No Has patient had a PCN reaction occurring within the last 10 years: Yes If all of the above answers are "NO", then may proceed with Cephalosporin use.    Strawberry Extract Anaphylaxis, Hives, Itching and Swelling   Cephalexin Hives, Itching and Swelling   Prednisone Nausea And Vomiting, Rash and Other (See Comments)    Cramping in legs, could barely walk     Patient Measurements: Height: 5\' 9"  (175.3 cm) Weight: (!) 165.1 kg (364 lb) IBW/kg (Calculated) : 66.2 Heparin Dosing Weight: ~108  Vital Signs: Temp: 98.2 F (36.8 C) (09/26 1847) Temp Source: Oral (09/26 1435) BP: 108/69 (09/26 1847) Pulse Rate: 63 (09/26 1847)  Labs: Recent Labs    01/11/21 2321 01/12/21 0922 01/12/21 1714  HGB 13.8  --   --   HCT 39.2  --   --   PLT 332  --   --   LABPROT 16.0*  --   --   INR 1.3*  --   --   HEPARINUNFRC  --  0.13* 0.29*  CREATININE 0.74  --   --      Estimated Creatinine Clearance: 151.4 mL/min (by C-G formula based on SCr of 0.74 mg/dL).   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    acute dural venous sinus thrombosis and right mastoid effusion/notes 05/28/2016   GERD (gastroesophageal reflux disease)    Gestational diabetes    "w/all 4 pregnancies"   Heart murmur    Hypertension    Metrorrhagia    Migraine    "at least twice/month" (05/28/2016)   Nystagmus 06/30/2016   OBESITY, MORBID    POSTURAL LIGHTHEADEDNESS    Sickle cell trait (HCC)    STEMI (ST elevation myocardial infarction) (HCC) 11/09/2017   in 11/11/2017, [place] - see records in Care Everywhere   SYNCOPE    Throat pain    TOBACCO DEPENDENCE     Assessment: 45 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 She is found to have likely new onset PE per CT. Her INR is on admission is sub-therapeutic at 1.3. CBC/renal function good.   This evening, 01/12/21  HL is subtherapeutic at 0.29 with heparin infusing at 1900 units/hr No bleeding or infusion related issues per RN No report of infusion being paused   Goal of Therapy:  Heparin level 0.3-0.7 units/ml Monitor platelets by anticoagulation protocol: Yes   Plan:  Heparin 1500 unit IV bolus x 1 and Increase heparin infusion to 2100 units/hr 6 hr Heparin level after  increase in infusion Daily CBC/Heparin level Monitor for bleeding and transition to oral anticoagulant  Selinda Eon, PharmD, BCPS Clinical Pharmacist  Please utilize Amion for appropriate phone number to reach the unit pharmacist [facility]) 01/12/2021 7:15 PM

## 2021-01-12 NOTE — H&P (Signed)
History and Physical    Angelica Brown BPZ:[id number] DOB: [birth year] DOA: 01/11/2021  PCP: Latrelle Dodrill, MD Consultants:  Pamelia Hoit - oncology; Katrinka Blazing - cardiology Patient coming from:  Home - lives with husband and 1 child (44yo); NOK: Husband, [phone]  Chief Complaint: SOB  HPI: Angelica Brown is a 45 y.o. female with medical history significant of chronic systolic CHF; CAD with h/o stent and subsequent in-stent stenosis due to Plavix failure; dural venous sinus thrombosis (2018); HTN; morbid obesity; tobacco dependence; OSA; and antiphospholipid syndrome on Coumadin presenting with SOB.  She went to the ER because there is a lump in her leg.  This is day 5 of the leg issue - she thought she might have hit it on something and it was sore.  2 days ago there was a little lump, she thought maybe something bit her.  She took a shower last night and her leg was very painful and red.  She laid down and she was very SOB with lying flat, ok with sitting up.  She decided to go to the ER.  She wears a CPAP but not home O2.  SOB appears to be better on [address] O2.  Her last missed dose was 2 weeks ago.  She has been seeing the Coumadin clinic and there has been difficulty getting her on the right dose.  In review of the INRs available at this time, she does not appear to have ever been therapeutic, with her highest INR 1.6 on 07/17/20.  She was previously on Xarelto and she had clots through Xarelto.  She would rather be on Xarelto - her work/personal life does not realistically allow her to be on Coumadin.  She had otitis media last week and is still taking antibiotics.  Her pain is better from this standpoint.  Her INR was low on 9/22 as well.      ED Course: [facility] to [facility] transfer, per Dr. Leafy Half:  [facility] with shortness of breath and right calf pain.  Upon evaluation in the emergency department, patient was found to have a  subtherapeutic INR of 1.3.  There was no ultrasound overnight and therefore CT angiogram of the chest was performed revealing filling defects in the left upper lobe and left lower lobe.  Patient is a family medicine residency patient and therefore they were called first to see if patient could follow-up closely as an outpatient however they recommended hospitalization.  Since patient is at [facility] patient would go to the hospitalist service.  Patient was initiated on heparin.    Review of Systems: As per HPI; otherwise review of systems reviewed and negative.   Ambulatory Status:  Ambulates without assistance  COVID Vaccine Status:   Complete plus booster  Past Medical History:  Diagnosis Date   ACS (acute coronary syndrome) (HCC) 05/04/2018   CHOLELITHIASIS    Chronic systolic CHF (congestive heart failure) (HCC) 06/13/2016   Ischemic CM (inf MI 05/2018) >> Echo 05/04/2018 - Mod LVH, EF 40-45, inf-lat HK, PASP 47 (mild pul HTN)   Coronary artery disease    Dermatophytosis of nail    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    acute dural venous sinus thrombosis and right mastoid effusion/notes 05/28/2016   GERD (gastroesophageal reflux disease)    Gestational diabetes    "w/all 4 pregnancies"   Heart murmur    Hypertension    Metrorrhagia    Migraine    "at  least twice/month" (05/28/2016)   Nystagmus 06/30/2016   OBESITY, MORBID    POSTURAL LIGHTHEADEDNESS    Sickle cell trait (HCC)    STEMI (ST elevation myocardial infarction) (HCC) 11/09/2017   in [place], [place] - see records in Care Everywhere   SYNCOPE    Throat pain    TOBACCO DEPENDENCE     Past Surgical History:  Procedure Laterality Date   BARTHOLIN GLAND CYST EXCISION Bilateral 2004   CORONARY THROMBECTOMY N/A 05/04/2018   Procedure: Coronary Thrombectomy;  Surgeon: Lyn Records, MD;  Location: [facility] INV[facility]IVE [facility];  Service: Cardiovascular;  Laterality: N/A;   EXCISIONAL HEMORRHOIDECTOMY  2006   "removed  a blood clot from one"   LEFT HEART CATH AND CORONARY ANGIOGRAPHY N/A 05/04/2018   Procedure: LEFT HEART CATH AND CORONARY ANGIOGRAPHY;  Surgeon: Lyn Records, MD;  Location: [facility] INV[facility] LAB;  Service: Cardiovascular;  Laterality: N/A;   TUBAL LIGATION  2007   WISDOM TOOTH EXTRACTION  1998    Social History   Socioeconomic History   Marital status: Married    Spouse name: Not on file   Number of children: Not on file   Years of education: Not on file   Highest education level: Not on file  Occupational History   Occupation: HR manager    Comment: [employer]   Occupation: patient relations    Comment: [employer] in [place]  Tobacco Use   Smoking status: Every Day    Packs/day: 1.00    Years: 18.00    Pack years: 18.00    Types: Cigarettes   Smokeless tobacco: Never   Tobacco comments:    Currently 6 cigs/day  Vaping Use   Vaping Use: Never used  Substance and Sexual Activity   Alcohol use: No   Drug use: No   Sexual activity: Yes    Birth control/protection: Surgical  Other Topics Concern   Not on file  Social History Narrative   Dennard Schaumann - 2003    Olga Coaster - 2008   Live with Grandmom    Social Determinants of Health   Financial Resource Strain: Not on file  Food Insecurity: Not on file  Transportation Needs: Not on file  Physical Activity: Not on file  Stress: Not on file  Social Connections: Not on file  Intimate Partner Violence: Not on file    Allergies  Allergen Reactions   Ampicillin Anaphylaxis, Shortness Of Breath and Swelling    Throat swells   Penicillins Anaphylaxis and Shortness Of Breath    Throat swelling Has patient had a PCN reaction causing immediate rash, facial/tongue/throat swelling, SOB or lightheadedness with hypotension: Yes Has patient had a PCN reaction causing severe rash involving mucus membranes or skin necrosis: No Has patient had a PCN reaction that required hospitalization: No Has patient had a PCN  reaction occurring within the last 10 years: Yes If all of the above answers are "NO", then may proceed with Cephalosporin use.    Strawberry Extract Anaphylaxis, Hives, Itching and Swelling   Cephalexin Hives, Itching and Swelling   Prednisone Nausea And Vomiting, Rash and Other (See Comments)    Cramping in legs, could barely walk     Family History  Problem Relation Age of Onset   Breast cancer Mother    Cancer Father        MELANOMA   Diabetes Father    Diabetes Paternal Grandmother    Diabetes Paternal Grandfather    Breast cancer Maternal  Aunt    Breast cancer Paternal Aunt     Prior to Admission medications   Medication Sig Start Date End Date Taking? Authorizing Provider  acetaminophen (TYLENOL) 500 MG tablet Take 500-1,000 mg by mouth every 6 (six) hours as needed for mild pain or headache.    [provider]  cefpodoxime (VANTIN) 200 MG tablet Take 1 tablet (200 mg total) by mouth 2 (two) times daily for 7 days. 01/08/21 01/15/21  Caccavale, Sophia, PA-C  metoprolol tartrate (LOPRESSOR) 25 MG tablet TAKE 1/2 (ONE-HALF) TABLET BY MOUTH ONCE DAILY WITH BREAKFAST 01/18/20   Latrelle Dodrill, MD  nitroGLYCERIN (NITROSTAT) 0.4 MG SL tablet Place 1 tablet (0.4 mg total) under the tongue every 5 (five) minutes x 3 doses as needed for chest pain. 12/09/20   Latrelle Dodrill, MD  omeprazole (PRILOSEC) 40 MG capsule Take 40 mg by mouth 2 (two) times daily as needed (for GERD-like symptoms).    [provider]  prasugrel (EFFIENT) 10 MG TABS tablet Take 1 tablet (10 mg total) by mouth daily. 11/28/20   Latrelle Dodrill, MD  rosuvastatin (CRESTOR) 40 MG tablet Take 1 tablet (40 mg total) by mouth daily. 11/24/20   Latrelle Dodrill, MD  topiramate (TOPAMAX) 50 MG tablet Take 1 tablet (50 mg total) by mouth daily. 11/24/20   Latrelle Dodrill, MD  warfarin (COUMADIN) 5 MG tablet Take 1.5 tablets (7.5 mg total) by mouth daily. 11/28/20   Latrelle Dodrill, MD   XARELTO 20 MG TABS tablet Take 1 tablet by mouth once daily 07/09/20 07/11/20  Serena Croissant, MD    Physical Exam: Vitals:   01/12/21 0400 01/12/21 0600 01/12/21 0641 01/12/21 1051  BP: (!) 162/102 124/63 116/61 113/75  Pulse: (!) 38 (!) 58 66 63  Resp: (!) 23 (!) 22 19 18   Temp:   98.4 F (36.9 C) 98.5 F (36.9 C)  TempSrc:   Oral Oral  SpO2: 94% 94% 99% 100%  Weight:      Height:         General:  Appears calm and comfortable and is in NAD Eyes:  PERRL, EOMI, normal lids, iris ENT:  grossly normal hearing, lips & tongue, mmm Neck:  no LAD, masses or thyromegaly Cardiovascular:  RRR, no m/r/g. No LE edema.  Respiratory:   CTA bilaterally with no wheezes/rales/rhonchi.  Normal to mildly increased respiratory effort, on [place] O2. Abdomen:  soft, NT, ND Skin:  no rash or induration seen on limited exam; small erythematous/warm mass on L medial calf   Musculoskeletal:  grossly normal tone BUE/BLE, good ROM, no bony abnormality Psychiatric:  grossly normal mood and affect, speech fluent and appropriate, AOx3 Neurologic:  CN 2-12 grossly intact, moves all extremities in coordinated fashion    Radiological Exams on Admission: Independently reviewed - see discussion in A/P where applicable  CT Angio Chest PE W and/or Wo Contrast  Result Date: 01/12/2021 CLINICAL DATA:  Shortness of breath EXAM: CT ANGIOGRAPHY CHEST WITH CONTRAST TECHNIQUE: Multidetector CT imaging of the chest was performed using the standard protocol during bolus administration of intravenous contrast. Multiplanar CT image reconstructions and MIPs were obtained to evaluate the vascular anatomy. CONTRAST:  01/14/2021 OMNIPAQUE IOHEXOL 350 MG/ML SOLN COMPARISON:  07/13/2020 FINDINGS: Cardiovascular: Heart is normal size. Aorta normal caliber. There appear to be small filling defects within a left upper lobe and left lower lobe pulmonary arterial branches although study is somewhat limited due to body habitus. Cannot exclude  small pulmonary  emboli. No definite pulmonary emboli on the right. Mediastinum/Nodes: No mediastinal, hilar, or axillary adenopathy. Trachea and esophagus are unremarkable. Thyroid unremarkable. Lungs/Pleura: Lungs are clear. No focal airspace opacities or suspicious nodules. No effusions. Upper Abdomen: Imaging into the upper abdomen demonstrates no acute findings. Musculoskeletal: Chest wall soft tissues are unremarkable. No acute bony abnormality. Review of the MIP images confirms the above findings. IMPRESSION: Suboptimal study due to body habitus. There appear to be small filling defects in pulmonary arterial branches on the left in the upper lobe and lower lobe. Cannot exclude small pulmonary emboli. Electronically Signed   By: Charlett Nose M.D.   On: 01/12/2021 01:15   DG Chest Port 1 View  Result Date: 01/11/2021 CLINICAL DATA:  Shortness of breath EXAM: PORTABLE CHEST 1 VIEW COMPARISON:  05/04/2018 FINDINGS: Heart and mediastinal contours are within normal limits. No focal opacities or effusions. No acute bony abnormality. IMPRESSION: No active disease. Electronically Signed   By: Charlett Nose M.D.   On: 01/11/2021 23:31    EKG: Independently reviewed.  NSR with rate 69; +PVCs; nonspecific ST changes with no evidence of acute ischemia   Labs on Admission: I have personally reviewed the available labs and imaging studies at the time of the admission.  Pertinent labs:   Normal BMP WBC 14.6 INR 1.3 COVID/flu negative   Assessment/Plan Principal Problem:   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HCC) Active Problems:   OBESITY, MORBID   TOBACCO DEPENDENCE   Dural venous sinus thrombosis   Chronic systolic CHF (congestive heart failure) (HCC)   Antiphospholipid antibody syndrome (HCC)   OSA (obstructive sleep apnea)   Hypertension   Recurrent pulmonary emboli with known coagulopathy -Patient with multiple prior episodes of thromboembolic disease presenting with new PE -She does have R calf  pain and likely has DVT but with PE there does not appear to be a benefit of DVT imaging at this time so will hold -She was previously on Xarelto and reports development of clots with this medication, prompting her to be changed to Coumadin -She also thinks she had SOB with Eliquis, although this is not listed in her drug allergies  -She has failed Coumadin at this time, but really has never been therapeutic on this medication; she reports difficulty with the frequent doctor appointments due to her working several jobs and also with the dietary restrictions -She strongly prefers DOAC therapy if possible; will consult Dr. Pamelia Hoit for assistance -Will admit on telemetry -No R heart strain on CT -Initiate anticoagulation - for now, will start treatment-dose heparin - O2 as needed -Smoking cessation and weight loss have been strongly encouraged; additionally, she is currently working at the [employer] as well as at [employer] in [place] with frequent long car trips.  We discussed that these are all modifiable risk factors. -The patient understands that thromboembolic disease can be catastrophic and even deadly and that she must be complaint with physician appointments and lifelong anticoagulation.  Chronic systolic CHF -Last echo was in 04/2018 and showed EF 40-45% -She is having significant PVCs and also had bradycardia into the 30s (reportedly chronically) when she sleeps -Will repeat echo -Appears compensated from a heart failure standpoint at this time  CAD -Prior h/o stent and in-stent thrombosis on Plavix -Continue Effient  HTN -Hold Lopressor in the setting of marked nocturnal bradycardia -Will cover with prn hydralazine if needed  OSA -Continue CPAP -Continue Topamax  HLD -Continue Lipitor  Tobacco dependence -Tobacco Dependence: encourage cessation.   -This  was discussed with the patient and should be reviewed on an ongoing basis.   -Patch declined - reports that  she does not have nicotine cravings when not smoking. -Smoking cessation counseling of >3 minutes provided.  OM -Completing course of antibiotics for total of 7 days  Morbid obesity -Body mass index is 53.75 kg/m..  -Weight loss should be encouraged -Outpatient PCP/bariatric medicine/bariatric surgery f/u encouraged  -She reports that she would really like to have a gastric sleeve but has been deemed not a candidate due to her coagulopathy     Note: This patient has been tested and is negative for the novel coronavirus COVID-19. The patient has been fully vaccinated against COVID-19.   Level of care: Telemetry Medical DVT prophylaxis: Heparin Code Status:  Full - confirmed with patient Family Communication: None present Disposition Plan:  The patient is from: home  Anticipated d/c is to: home without [facility] services  Anticipated d/c date will depend on clinical response to treatment, likely 2-3 days  Patient is currently: acutely ill Consults called: Hematology  Admission status:  Admit - It is my clinical opinion that admission to INPATIENT is reasonable and necessary because of the expectation that this patient will require hospital care that crosses at least 2 midnights to treat this condition based on the medical complexity of the problems presented.  Given the aforementioned information, the predictability of an adverse outcome is felt to be significant.    Jonah Blue MD [facility]   How to contact the [facility] Attending or Consulting provider 7A - 7P or covering provider during after hours 7P -7A, for this patient?  Check the care team in [facility] and look for a) attending/consulting [facility] provider listed and b) the [facility] team listed Log into [email or web address] and use [facility] universal password to access. If you do not have the password, please contact the hospital operator. Locate the [facility] provider you are looking for under [facility] and page to a number that you can  be directly reached. If you still have difficulty reaching the provider, please page the [facility] (Director on Call) for the Hospitalists listed on amion for assistance.   01/12/2021, 12:09 PM

## 2021-01-12 NOTE — Progress Notes (Signed)
HEMATOLOGY:  Antiphospholipid antibody syndrome 07/13/20: Rt leg pain U/S revealed DVT (while on Eliquis and Aspirin: APL Ab syndrome) Possible cause of blood clot: Travel by car to [place] and back   2018: H/O dural venous sinus thrombosis: Previously treated with Xarelto History of stent thrombosis due to Plavix failure Prior treatment: Coumadin therapy with widely fluctuating INRs  Current complaint: Shortness of breath: CT chest revealed left upper lobe and left lower lobe PE Recommendation: Patient is at a very high risk of blood clots having failed multiple lines of therapy including Eliquis, Xarelto, Coumadin Ideally the recommendation would be Lovenox injections but given the lifelong requirement for anticoagulation, these are not ideal. Therefore, any of the oral anticoagulants are reasonable. Since she has never been on Pradaxa, we can consider using Pradaxa 150 mg p.o. twice daily

## 2021-01-12 NOTE — Progress Notes (Signed)
Patient refused cpap at this time. She states that she has oxygen and does not want our cpap.

## 2021-01-12 NOTE — ED Provider Notes (Signed)
[facility] EMERGENCY DEPT Provider Note   CSN: [id number] Arrival date & time: 01/11/21  2230     History Chief Complaint  Patient presents with   Shortness of Breath    Angelica Brown is a 45 y.o. female.  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  Patient reports over the last 4 days she has had progressive right lower calf pain.  She denies injury.  She states that most recently her INR has been subtherapeutic.  She was recently seen and evaluated for headache and had negative work-up for recurrent dural venous sinus thrombosis.  At that time her INR was 1.0.  Patient states that she has had leg pain.  It is worse with palpation.  She rates it 10 out of 10.  It is 4 out of 10 when she is sitting.  She is also had some shortness of breath.  It is worse when she lays flat.  She denies any chest pain.  Denies any recent fevers or cough.  Past Medical History:  Diagnosis Date   ACS (acute coronary syndrome) (HCC) 05/04/2018   CHOLELITHIASIS    Chronic systolic CHF (congestive heart failure) (HCC) 06/13/2016   Ischemic CM (inf MI 05/2018) >> Echo 05/04/2018 - Mod LVH, EF 40-45, inf-lat HK, PASP 47 (mild pul HTN)   Coronary artery disease    Dermatophytosis of nail    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    acute dural venous sinus thrombosis and right mastoid effusion/notes 05/28/2016   GERD (gastroesophageal reflux disease)    Gestational diabetes    "w/all 4 pregnancies"   Heart murmur    Hypertension    Metrorrhagia    Migraine    "at least twice/month" (05/28/2016)   Nystagmus 06/30/2016   OBESITY, MORBID    OBESITY, NOS    Pneumonia 11/2014   POSTURAL LIGHTHEADEDNESS    Sickle cell trait (HCC)    STEMI (ST elevation myocardial infarction) (HCC) 11/09/2017   in [place], [place] - see  records in Care Everywhere   SYNCOPE    Throat pain    TOBACCO DEPENDENCE    URI    Urinary frequency     Patient Active Problem List   Diagnosis Date Noted   Tonsillolith 12/09/2020   Chronic anticoagulation 09/24/2020   Hypertension 05/27/2020   Lateral epicondylitis 04/30/2020   Prediabetes 11/27/2019   Pelvic pain 08/06/2019   OSA (obstructive sleep apnea) 08/06/2019   Left leg pain 03/22/2019   Acute left-sided low back pain without sciatica 11/21/2018   Left flank pain 11/14/2018   Hx of Inf MI (7/19 Tx with BMS to RCA; stent thrombosis 9/19 tx with DES to RCA; stent thrombosis 04/2018 tx with thrombectomy) 05/04/2018   Bradycardia 04/20/2018   History of MI (myocardial infarction) 04/20/2018   Antiphospholipid antibody syndrome (HCC) 11/01/2016   Seborrheic dermatitis 10/27/2016   Nystagmus 06/30/2016   Chronic systolic CHF (congestive heart failure) (HCC) 06/13/2016   Acute stress reaction 06/09/2016   Mastoiditis of right side 05/29/2016   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   Headache 05/26/2016   Family history of breast cancer 01/17/2013   Sickle cell trait (HCC)    Dizziness 08/29/2008   OBESITY, MORBID 07/10/2008   TOBACCO DEPENDENCE 06/16/2006   METRORRHAGIA 06/16/2006    Past Surgical History:  Procedure Laterality Date   BARTHOLIN  GLAND CYST EXCISION Bilateral 2004   CORONARY THROMBECTOMY N/A 05/04/2018   Procedure: Coronary Thrombectomy;  Surgeon: Lyn Records, MD;  Location: [facility] INVASIVE CV LAB;  Service: Cardiovascular;  Laterality: N/A;   EXCISIONAL HEMORRHOIDECTOMY  2006   "removed a blood clot from one"   LEFT HEART CATH AND CORONARY ANGIOGRAPHY N/A 05/04/2018   Procedure: LEFT HEART CATH AND CORONARY ANGIOGRAPHY;  Surgeon: Lyn Records, MD;  Location: [facility] INVASIVE CV LAB;  Service: Cardiovascular;  Laterality: N/A;   TUBAL LIGATION  2007   WISDOM TOOTH EXTRACTION  1998     OB History     Gravida  5   Para  5   Term  4   Preterm   1   AB      Living  4      SAB      IAB      Ectopic      Multiple  1   Live Births              Family History  Problem Relation Age of Onset   Breast cancer Mother    Cancer Father        MELANOMA   Diabetes Father    Diabetes Paternal Grandmother    Diabetes Paternal Grandfather    Breast cancer Maternal Aunt    Breast cancer Paternal Aunt     Social History   Tobacco Use   Smoking status: Every Day    Packs/day: 0.15    Years: 18.00    Pack years: 2.70    Types: Cigarettes   Smokeless tobacco: Never  Vaping Use   Vaping Use: Never used  Substance Use Topics   Alcohol use: No   Drug use: No    Home Medications Prior to Admission medications   Medication Sig Start Date End Date Taking? Authorizing Provider  acetaminophen (TYLENOL) 500 MG tablet Take 500-1,000 mg by mouth every 6 (six) hours as needed for mild pain or headache.    [provider]  cefpodoxime (VANTIN) 200 MG tablet Take 1 tablet (200 mg total) by mouth 2 (two) times daily for 7 days. 01/08/21 01/15/21  Caccavale, Sophia, PA-C  metoprolol tartrate (LOPRESSOR) 25 MG tablet TAKE 1/2 (ONE-HALF) TABLET BY MOUTH ONCE DAILY WITH BREAKFAST 01/18/20   Latrelle Dodrill, MD  nitroGLYCERIN (NITROSTAT) 0.4 MG SL tablet Place 1 tablet (0.4 mg total) under the tongue every 5 (five) minutes x 3 doses as needed for chest pain. 12/09/20   Latrelle Dodrill, MD  omeprazole (PRILOSEC) 40 MG capsule Take 40 mg by mouth 2 (two) times daily as needed (for GERD-like symptoms).    [provider]  prasugrel (EFFIENT) 10 MG TABS tablet Take 1 tablet (10 mg total) by mouth daily. 11/28/20   Latrelle Dodrill, MD  rosuvastatin (CRESTOR) 40 MG tablet Take 1 tablet (40 mg total) by mouth daily. 11/24/20   Latrelle Dodrill, MD  topiramate (TOPAMAX) 50 MG tablet Take 1 tablet (50 mg total) by mouth daily. 11/24/20   Latrelle Dodrill, MD  warfarin (COUMADIN) 5 MG tablet Take 1.5 tablets (7.5  mg total) by mouth daily. 11/28/20   Latrelle Dodrill, MD  XARELTO 20 MG TABS tablet Take 1 tablet by mouth once daily 07/09/20 07/11/20  Serena Croissant, MD    Allergies    Ampicillin, Penicillins, Strawberry extract, Cephalexin, and Prednisone  Review of Systems   Review of Systems  Constitutional:  Negative for fever.  Respiratory:  Positive for shortness of breath. Negative for cough and chest tightness.   Cardiovascular:  Positive for leg swelling. Negative for chest pain.  Gastrointestinal:  Negative for abdominal pain, nausea and vomiting.  All other systems reviewed and are negative.  Physical Exam Updated Vital Signs BP 121/78   Pulse 62   Temp 98.2 F (36.8 C)   Resp (!) 24   Ht 1.753 m (5\' 9" )   Wt (!) 165.1 kg   LMP 11/23/2020 (Exact Date)   SpO2 94%   BMI 53.75 kg/m   Physical Exam Vitals and nursing note reviewed.  Constitutional:      Appearance: She is well-developed. She is obese.  HENT:     Head: Normocephalic and atraumatic.     Mouth/Throat:     Mouth: Mucous membranes are moist.  Eyes:     Pupils: Pupils are equal, round, and reactive to light.  Cardiovascular:     Rate and Rhythm: Normal rate and regular rhythm.     Heart sounds: Normal heart sounds.  Pulmonary:     Effort: Pulmonary effort is normal. No respiratory distress.     Breath sounds: No wheezing.     Comments: Limited by body habitus Chest:     Chest wall: No tenderness.  Abdominal:     General: Bowel sounds are normal.     Palpations: Abdomen is soft.  Musculoskeletal:     Cervical back: Neck supple.     Right lower leg: Edema present.     Left lower leg: Edema present.     Comments: Trace symmetric bilateral lower extremity edema, point tenderness palpation right posterior calf with palpable nodule, no overlying skin changes  Skin:    General: Skin is warm and dry.  Neurological:     Mental Status: She is alert and oriented to person, place, and time.  Psychiatric:         Mood and Affect: Mood normal.    ED Results / Procedures / Treatments   Labs (all labs ordered are listed, but only abnormal results are displayed) Labs Reviewed  CBC WITH DIFFERENTIAL/PLATELET - Abnormal; Notable for the following components:      Result Value   WBC 14.6 (*)    RDW 15.6 (*)    Neutro Abs 8.7 (*)    Monocytes Absolute 1.4 (*)    All other components within normal limits  PROTIME-INR - Abnormal; Notable for the following components:   Prothrombin Time 16.0 (*)    INR 1.3 (*)    All other components within normal limits  RESP PANEL BY RT-PCR (FLU A&B, COVID) [id number]  BASIC METABOLIC PANEL    EKG EKG Interpretation  Date/Time:  Sunday January 11 2021 22:44:17 EDT Ventricular Rate:  71 PR Interval:  162 QRS Duration: 72 QT Interval:  390 QTC Calculation: 423 R Axis:   55 Text Interpretation: Normal sinus rhythm Right atrial enlargement Possible Inferior infarct , age undetermined Abnormal ECG Confirmed by 12-03-1994 (Ross Marcus) on 01/11/2021 11:14:14 PM  Radiology CT Angio Chest PE W and/or Wo Contrast  Result Date: 01/12/2021 CLINICAL DATA:  Shortness of breath EXAM: CT ANGIOGRAPHY CHEST WITH CONTRAST TECHNIQUE: Multidetector CT imaging of the chest was performed using the standard protocol during bolus administration of intravenous contrast. Multiplanar CT image reconstructions and MIPs were obtained to evaluate the vascular anatomy. CONTRAST:  01/14/2021 OMNIPAQUE IOHEXOL 350 MG/ML SOLN COMPARISON:  07/13/2020 FINDINGS: Cardiovascular: Heart is normal size. Aorta normal caliber. There appear to be  small filling defects within a left upper lobe and left lower lobe pulmonary arterial branches although study is somewhat limited due to body habitus. Cannot exclude small pulmonary emboli. No definite pulmonary emboli on the right. Mediastinum/Nodes: No mediastinal, hilar, or axillary adenopathy. Trachea and esophagus are unremarkable. Thyroid unremarkable. Lungs/Pleura:  Lungs are clear. No focal airspace opacities or suspicious nodules. No effusions. Upper Abdomen: Imaging into the upper abdomen demonstrates no acute findings. Musculoskeletal: Chest wall soft tissues are unremarkable. No acute bony abnormality. Review of the MIP images confirms the above findings. IMPRESSION: Suboptimal study due to body habitus. There appear to be small filling defects in pulmonary arterial branches on the left in the upper lobe and lower lobe. Cannot exclude small pulmonary emboli. Electronically Signed   By: Charlett Nose M.D.   On: 01/12/2021 01:15   DG Chest Port 1 View  Result Date: 01/11/2021 CLINICAL DATA:  Shortness of breath EXAM: PORTABLE CHEST 1 VIEW COMPARISON:  05/04/2018 FINDINGS: Heart and mediastinal contours are within normal limits. No focal opacities or effusions. No acute bony abnormality. IMPRESSION: No active disease. Electronically Signed   By: Charlett Nose M.D.   On: 01/11/2021 23:31    Procedures .Critical Care Performed by: Shon Baton, MD Authorized by: Shon Baton, MD   Critical care provider statement:    Critical care time (minutes):  45   Critical care was necessary to treat or prevent imminent or life-threatening deterioration of the following conditions: PE.   Critical care was time spent personally by me on the following activities:  Discussions with consultants, evaluation of patient's response to treatment, examination of patient, ordering and performing treatments and interventions, ordering and review of laboratory studies, ordering and review of radiographic studies, pulse oximetry, re-evaluation of patient's condition, obtaining history from patient or surrogate and review of old charts   Medications Ordered in ED Medications  heparin ADULT infusion 100 units/mL (25000 units/235mL) (has no administration in time range)  iohexol (OMNIPAQUE) 350 MG/ML injection 100 mL (100 mLs Intravenous Contrast Given 01/12/21 0053)    ED  Course  I have reviewed the triage vital signs and the nursing notes.  Pertinent labs & imaging results that were available during my care of the patient were reviewed by me and considered in my medical decision making (see chart for details).    MDM Rules/Calculators/A&P                           Patient presents with leg pain and shortness of breath.  She is nontoxic-appearing.  Vital signs notable for respirations of 24 and O2 sats 94%.  She is not in any respiratory distress.  She is obese which limits her exam somewhat but no overt respiratory distress.  She has a significant clotting history including ACS and venous dural thrombosis.  Recently subtherapeutic with her INR.  She does have some tenderness of the calf.  PE would be high consideration.  Other considerations for her shortness of breath be heart failure.  She is not having any chest pains.  Have lower suspicion for acute ACS and her EKG is without acute ischemic changes.  Labs obtained.  Chest x-ray without pneumothorax or pneumonia.  No evidence of pulmonary edema.  Do not have access to ultrasound at this hour; however, given her shortness of breath, will pursue CT to rule out PE.  CT scan is limited because of her body habitus but concerning for potential  subsegmental PEs.  Given that she is subtherapeutic, feel inclined that she may need admission for IV heparin in an attempt to get her to therapeutic levels.  I did discuss with the family practice resident, Autry-Lott who feels like plan is reasonable.  They cannot take the patient for admission so will consult the hospitalist for admission.  Heparin IV ordered.  Patient updated.  She remains hemodynamically stable.  Final Clinical Impression(s) / ED Diagnoses Final diagnoses:  Multiple subsegmental pulmonary emboli without acute cor pulmonale [facility])    Rx / DC Orders ED Discharge Orders     None        Wilkie Aye, Mayer Masker, MD 01/12/21 [phone]

## 2021-01-13 ENCOUNTER — Inpatient Hospital Stay (HOSPITAL_COMMUNITY): Payer: Federal, State, Local not specified - PPO

## 2021-01-13 DIAGNOSIS — I2609 Other pulmonary embolism with acute cor pulmonale: Secondary | ICD-10-CM

## 2021-01-13 LAB — ECHOCARDIOGRAM COMPLETE
Area-P 1/2: 2.66 cm2
Calc EF: 52.5 %
Height: 69 in
S' Lateral: 4.2 cm
Single Plane A2C EF: 58.5 %
Single Plane A4C EF: 46.7 %
Weight: 5824 oz

## 2021-01-13 LAB — CBC
HCT: 37.8 % (ref 36.0–46.0)
Hemoglobin: 13.5 g/dL (ref 12.0–15.0)
MCH: 28.3 pg (ref 26.0–34.0)
MCHC: 35.7 g/dL (ref 30.0–36.0)
MCV: 79.2 fL — ABNORMAL LOW (ref 80.0–100.0)
Platelets: 319 10*3/uL (ref 150–400)
RBC: 4.77 MIL/uL (ref 3.87–5.11)
RDW: 15.4 % (ref 11.5–15.5)
WBC: 16.1 10*3/uL — ABNORMAL HIGH (ref 4.0–10.5)
nRBC: 0 % (ref 0.0–0.2)

## 2021-01-13 LAB — BASIC METABOLIC PANEL
Anion gap: 7 (ref 5–15)
BUN: 13 mg/dL (ref 6–20)
CO2: 23 mmol/L (ref 22–32)
Calcium: 9.3 mg/dL (ref 8.9–10.3)
Chloride: 110 mmol/L (ref 98–111)
Creatinine, Ser: 0.83 mg/dL (ref 0.44–1.00)
GFR, Estimated: >60 mL/min (ref >60)
Glucose, Bld: 134 mg/dL — ABNORMAL HIGH (ref 70–99)
Potassium: 3.6 mmol/L (ref 3.5–5.1)
Sodium: 140 mmol/L (ref 135–145)

## 2021-01-13 LAB — HIV ANTIBODY (ROUTINE TESTING W REFLEX): HIV Screen 4th Generation wRfx: NONREACTIVE

## 2021-01-13 LAB — HEPARIN LEVEL (UNFRACTIONATED)
Heparin Unfractionated: 0.4 IU/mL (ref 0.30–0.70)
Heparin Unfractionated: 0.39 IU/mL (ref 0.30–0.70)

## 2021-01-13 MED ORDER — MORPHINE SULFATE (PF) 2 MG/ML IV SOLN
1.0000 mg | Freq: Once | INTRAVENOUS | Status: AC
Start: 2021-01-13 — End: 2021-01-13
  Administered 2021-01-13: 1 mg via INTRAVENOUS
  Filled 2021-01-13: qty 1

## 2021-01-13 MED ORDER — DABIGATRAN ETEXILATE MESYLATE 150 MG PO CAPS
150.0000 mg | ORAL_CAPSULE | Freq: Two times a day (BID) | ORAL | 1 refills | Status: DC
Start: 2021-01-13 — End: 2022-03-22

## 2021-01-13 MED ORDER — DABIGATRAN ETEXILATE MESYLATE 150 MG PO CAPS
150.0000 mg | ORAL_CAPSULE | Freq: Two times a day (BID) | ORAL | Status: DC
  Administered 2021-01-13: 150 mg via ORAL
  Filled 2021-01-13: qty 1

## 2021-01-13 NOTE — Progress Notes (Signed)
Nutrition Brief Note RD working remotely.   Consult received for "Nutritional Goals". Consult was entered by ED Physician.   Wt Readings from Last 15 Encounters:  01/11/21 (!) 165.1 kg  01/08/21 (!) 165.1 kg  12/09/20 (!) 162.9 kg  10/16/20 (!) 163.4 kg  09/24/20 (!) 159.7 kg  08/15/20 (!) 160.8 kg  07/17/20 (!) 163.2 kg  07/13/20 (!) 163.3 kg  07/11/20 (!) 160.6 kg  07/08/20 (!) 163.3 kg  06/12/20 (!) 170.2 kg  06/10/20 (!) 172.1 kg  04/30/20 (!) 170.2 kg  11/28/19 (!) 165.6 kg  11/22/19 (!) 165.6 kg    Body mass index is 53.75 kg/m. Patient meets criteria for morbid obesity based on current BMI. Weight has been fairly stable for the past 13 months.   No information documented in the edema section of flow sheet. She is noted to be +149 ml this admission. Skin noted to be WDL.  Unable to locate in ED Physician's note information outlining what is desired from consult to RD. Her note outlines patient with need for life-long coumadin and desire to continue encouraging weight loss for patient in the form of outpatient bariatric medicine/bariatric surgery follow-up and that patient indicated to her desire for gastric sleeve though patient was previously informed she is not a candidate due to coagulopathy.  Current diet order is Heart Healthy.Labs and medications reviewed.   No inpatient nutrition interventions warranted at this time. If nutrition issues arise, please re-consult RD.   If supervised weight loss and/or future bariatric surgery is desired, recommend referral to [facility] located on [address], across the street from [employer].        Trenton Gammon, MS, RD, LDN, CNSC Inpatient Clinical Dietitian RD pager # available in [facility]  After hours/weekend pager # available in [facility]

## 2021-01-13 NOTE — Discharge Summary (Signed)
Physician Discharge Summary  Lucyann Romano Brown WUX:[id number] DOB: 1975-06-16 DOA: 01/11/2021  PCP: Latrelle Dodrill, MD  Admit date: 01/11/2021 Discharge date: 01/13/2021  Admitted From: Home Disposition: Home  Recommendations for Outpatient Follow-up:  Follow up with PCP in 1-2 weeks Please obtain BMP/CBC in one week Please follow with heme-onc as scheduled  Home Health: None Equipment/Devices: None  Discharge Condition: Stable CODE STATUS: Full Diet recommendation: Low-salt low-fat diet  Brief/Interim Summary: Angelica Brown is a 45 y.o. female with medical history significant of chronic systolic CHF; CAD with h/o stent and subsequent in-stent stenosis due to Plavix failure; dural venous sinus thrombosis (2018); HTN; morbid obesity; tobacco dependence; OSA; and antiphospholipid syndrome on Coumadin presenting with SOB.  She went to the ER because there is a lump in her leg -patient found to have acute PE with presumed right calf DVT, not currently imaged as treatment would not change.  She unfortunately has a lengthy history of medication noncompliance, nonadherence as well as multiple episodes of failure of multiple anticoagulants in the setting of antiphospholipid syndrome.  She follows closely with Dr. Pamelia Hoit heme-onc.  Discussed case with him and we initiated patient on Pradaxa which is a medication she has not tried before.  We had a lengthy discussion about this medication need for compliance, apparently patient was extremely noncompliant on Coumadin, unable to have INR checked given her frequent long car trips as well as current job and family requirements that limit her ability to follow-up for INR checks.  In the future if Coumadin is considered a home INR machine would likely be reasonable.  Patient symptoms otherwise have resolved she is back to baseline on heparin drip, will transition to Pradaxa as discussed and discharge patient home with close follow-up with  PCP and hematology oncology as scheduled.  No home medication changes other than transition from Coumadin to Pradaxa via heparin bridge.   Discharge Instructions  Discharge Instructions     Diet - low sodium heart healthy   Complete by: As directed    Increase activity slowly   Complete by: As directed       Allergies as of 01/13/2021       Reactions   Ampicillin Anaphylaxis, Shortness Of Breath, Swelling   Throat swells   Penicillins Anaphylaxis, Shortness Of Breath   Throat swelling Has patient had a PCN reaction causing immediate rash, facial/tongue/throat swelling, SOB or lightheadedness with hypotension: Yes Has patient had a PCN reaction causing severe rash involving mucus membranes or skin necrosis: No Has patient had a PCN reaction that required hospitalization: No Has patient had a PCN reaction occurring within the last 10 years: Yes If all of the above answers are "NO", then may proceed with Cephalosporin use.   Strawberry Extract Anaphylaxis, Hives, Itching, Swelling   Cephalexin Hives, Itching, Swelling   Prednisone Nausea And Vomiting, Rash, Other (See Comments)   Cramping in legs, could barely walk        Medication List     STOP taking these medications    warfarin 5 MG tablet Commonly known as: COUMADIN       TAKE these medications    acetaminophen 500 MG tablet Commonly known as: TYLENOL Take 500-1,000 mg by mouth every 6 (six) hours as needed for mild pain or headache.   cefpodoxime 200 MG tablet Commonly known as: VANTIN Take 1 tablet (200 mg total) by mouth 2 (two) times daily for 7 days.   dabigatran 150 MG Caps capsule Commonly known  as: PRADAXA Take 1 capsule (150 mg total) by mouth every 12 (twelve) hours.   metoprolol tartrate 25 MG tablet Commonly known as: LOPRESSOR TAKE 1/2 (ONE-HALF) TABLET BY MOUTH ONCE DAILY WITH BREAKFAST What changed:  how much to take how to take this when to take this additional instructions    nitroGLYCERIN 0.4 MG SL tablet Commonly known as: NITROSTAT Place 1 tablet (0.4 mg total) under the tongue every 5 (five) minutes x 3 doses as needed for chest pain.   omeprazole 40 MG capsule Commonly known as: PRILOSEC Take 40 mg by mouth at bedtime.   prasugrel 10 MG Tabs tablet Commonly known as: EFFIENT Take 1 tablet (10 mg total) by mouth daily. What changed: when to take this   rosuvastatin 40 MG tablet Commonly known as: CRESTOR Take 1 tablet (40 mg total) by mouth daily. What changed: when to take this   topiramate 50 MG tablet Commonly known as: TOPAMAX Take 1 tablet (50 mg total) by mouth daily. What changed: when to take this        Allergies  Allergen Reactions   Ampicillin Anaphylaxis, Shortness Of Breath and Swelling    Throat swells   Penicillins Anaphylaxis and Shortness Of Breath    Throat swelling Has patient had a PCN reaction causing immediate rash, facial/tongue/throat swelling, SOB or lightheadedness with hypotension: Yes Has patient had a PCN reaction causing severe rash involving mucus membranes or skin necrosis: No Has patient had a PCN reaction that required hospitalization: No Has patient had a PCN reaction occurring within the last 10 years: Yes If all of the above answers are "NO", then may proceed with Cephalosporin use.    Strawberry Extract Anaphylaxis, Hives, Itching and Swelling   Cephalexin Hives, Itching and Swelling   Prednisone Nausea And Vomiting, Rash and Other (See Comments)    Cramping in legs, could barely walk     Consultations: Oncology   Procedures/Studies: CT Angio Chest PE W and/or Wo Contrast  Result Date: 01/12/2021 CLINICAL DATA:  Shortness of breath EXAM: CT ANGIOGRAPHY CHEST WITH CONTRAST TECHNIQUE: Multidetector CT imaging of the chest was performed using the standard protocol during bolus administration of intravenous contrast. Multiplanar CT image reconstructions and MIPs were obtained to evaluate the  vascular anatomy. CONTRAST:  OMNIPAQUE IOHEXOL 350 MG/ML SOLN COMPARISON:  07/13/2020 FINDINGS: Cardiovascular: Heart is normal size. Aorta normal caliber. There appear to be small filling defects within a left upper lobe and left lower lobe pulmonary arterial branches although study is somewhat limited due to body habitus. Cannot exclude small pulmonary emboli. No definite pulmonary emboli on the right. Mediastinum/Nodes: No mediastinal, hilar, or axillary adenopathy. Trachea and esophagus are unremarkable. Thyroid unremarkable. Lungs/Pleura: Lungs are clear. No focal airspace opacities or suspicious nodules. No effusions. Upper Abdomen: Imaging into the upper abdomen demonstrates no acute findings. Musculoskeletal: Chest wall soft tissues are unremarkable. No acute bony abnormality. Review of the MIP images confirms the above findings. IMPRESSION: Suboptimal study due to body habitus. There appear to be small filling defects in pulmonary arterial branches on the left in the upper lobe and lower lobe. Cannot exclude small pulmonary emboli. Electronically Signed   By: Charlett Nose M.D.   On: 01/12/2021 01:15   MR Brain W and Wo Contrast  Result Date: 01/08/2021 CLINICAL DATA:  Dural venous sinus thrombosis suspected (Ped 0-18y) EXAM: MRI HEAD WITHOUT AND WITH CONTRAST MR VENOGRAM HEAD WITHOUT AND WITH CONTRAST TECHNIQUE: Multiplanar, multi-echo pulse sequences of the brain and surrounding  structures were acquired without and with intravenous contrast. Angiographic images of the intracranial venous structures were acquired using MRV technique without and with intravenous contrast. CONTRAST:  41mL GADAVIST GADOBUTROL 1 MMOL/ML IV SOLN COMPARISON:  11/29/2019. FINDINGS: MRI HEAD WITHOUT AND WITH CONTRAST Mildly motion limited study.  Within this limitation: Brain: No acute infarction, hemorrhage, hydrocephalus, extra-axial collection or mass lesion. No pathologic intracranial enhancement. Partially empty  sella. No abnormal enhancement. Vascular: Major arterial flow voids are maintained at the skull base. Dural venous sinuses are evaluated below. Skull and upper cervical spine: Chronic diffuse T1 hypointensity of the marrow without focal marrow replacing lesion. Sinuses/Orbits: Mild paranasal sinus mucosal thickening. No acute orbital findings. Other: No mastoid effusions. MR VENOGRAM HEAD WITHOUT AND WITH CONTRAST There is no evidence of dural venous sinus or deep cerebral vein thrombosis. There is a short-segment area of apparent loss of flow related signal on the time-of-flight MRV; however, this area and enhances normally on the postcontrast imaging and therefore likely is artifactual. Narrowing of the distal left transverse sinus, chronic. IMPRESSION: 1. No evidence of acute intracranial abnormality on this mildly motion limited study. 2. No evidence of dural venous sinus thrombosis 3. Similar partially empty sella and chronically narrowed distal transverse sinuses, nonspecific findings that can be associated with idiopathic intracranial hypertension in the correct clinical setting. Electronically Signed   By: Feliberto Harts M.D.   On: 01/08/2021 12:27   DG Chest Port 1 View  Result Date: 01/11/2021 CLINICAL DATA:  Shortness of breath EXAM: PORTABLE CHEST 1 VIEW COMPARISON:  05/04/2018 FINDINGS: Heart and mediastinal contours are within normal limits. No focal opacities or effusions. No acute bony abnormality. IMPRESSION: No active disease. Electronically Signed   By: Charlett Nose M.D.   On: 01/11/2021 23:31   MR MRV HEAD W WO CONTRAST  Result Date: 01/08/2021 CLINICAL DATA:  Dural venous sinus thrombosis suspected (Ped 0-18y) EXAM: MRI HEAD WITHOUT AND WITH CONTRAST MR VENOGRAM HEAD WITHOUT AND WITH CONTRAST TECHNIQUE: Multiplanar, multi-echo pulse sequences of the brain and surrounding structures were acquired without and with intravenous contrast. Angiographic images of the intracranial venous  structures were acquired using MRV technique without and with intravenous contrast. CONTRAST:  56mL GADAVIST GADOBUTROL 1 MMOL/ML IV SOLN COMPARISON:  11/29/2019. FINDINGS: MRI HEAD WITHOUT AND WITH CONTRAST Mildly motion limited study.  Within this limitation: Brain: No acute infarction, hemorrhage, hydrocephalus, extra-axial collection or mass lesion. No pathologic intracranial enhancement. Partially empty sella. No abnormal enhancement. Vascular: Major arterial flow voids are maintained at the skull base. Dural venous sinuses are evaluated below. Skull and upper cervical spine: Chronic diffuse T1 hypointensity of the marrow without focal marrow replacing lesion. Sinuses/Orbits: Mild paranasal sinus mucosal thickening. No acute orbital findings. Other: No mastoid effusions. MR VENOGRAM HEAD WITHOUT AND WITH CONTRAST There is no evidence of dural venous sinus or deep cerebral vein thrombosis. There is a short-segment area of apparent loss of flow related signal on the time-of-flight MRV; however, this area and enhances normally on the postcontrast imaging and therefore likely is artifactual. Narrowing of the distal left transverse sinus, chronic. IMPRESSION: 1. No evidence of acute intracranial abnormality on this mildly motion limited study. 2. No evidence of dural venous sinus thrombosis 3. Similar partially empty sella and chronically narrowed distal transverse sinuses, nonspecific findings that can be associated with idiopathic intracranial hypertension in the correct clinical setting. Electronically Signed   By: Feliberto Harts M.D.   On: 01/08/2021 12:27   ECHOCARDIOGRAM COMPLETE  Result Date:  01/13/2021    ECHOCARDIOGRAM REPORT   Patient Name:   JOVITA PERSING Date of Exam: 01/13/2021 Medical Rec #:  [id number]              Height:       69.0 in Accession #:    [id number]             Weight:       364.0 lb Date of Birth:  [birth year]              BSA:          2.665 m Patient Age:    44 years                BP:           109/61 mmHg Patient Gender: F                      HR:           77 bpm. Exam Location:  Inpatient Procedure: 2D Echo, Cardiac Doppler and Color Doppler Indications:    Pulmonary Embolus I26.09  History:        Patient has prior history of Echocardiogram examinations, most                 recent 05/04/2018. CAD and Previous Myocardial Infarction,                 Signs/Symptoms:Murmur; Risk Factors:Hypertension. Ischemic CM                 (inf MI 05/2018). GERD. Antiphospholipid antibody syndrome,                 Recurrent pulmonary emboli.  Sonographer:    [name] RDCS Referring Phys: [id number] JENNIFER YATES IMPRESSIONS  1. Left ventricular ejection fraction, by estimation, is 60 to 65%. The left ventricle has normal function. The left ventricle has no regional wall motion abnormalities. The left ventricular internal cavity size was mildly dilated. Left ventricular diastolic parameters are consistent with Grade I diastolic dysfunction (impaired relaxation).  2. Right ventricular systolic function is normal. The right ventricular size is normal.  3. The mitral valve is normal in structure. No evidence of mitral valve regurgitation. No evidence of mitral stenosis.  4. The aortic valve is normal in structure. Aortic valve regurgitation is not visualized. No aortic stenosis is present.  5. The inferior vena cava is normal in size with greater than 50% respiratory variability, suggesting right atrial pressure of 3 mmHg. FINDINGS  Left Ventricle: Left ventricular ejection fraction, by estimation, is 60 to 65%. The left ventricle has normal function. The left ventricle has no regional wall motion abnormalities. The left ventricular internal cavity size was mildly dilated. There is  no left ventricular hypertrophy. Left ventricular diastolic parameters are consistent with Grade I diastolic dysfunction (impaired relaxation). Right Ventricle: The right ventricular size is normal. No increase in right  ventricular wall thickness. Right ventricular systolic function is normal. Left Atrium: Left atrial size was normal in size. Right Atrium: Right atrial size was normal in size. Pericardium: There is no evidence of pericardial effusion. Mitral Valve: The mitral valve is normal in structure. No evidence of mitral valve regurgitation. No evidence of mitral valve stenosis. Tricuspid Valve: The tricuspid valve is normal in structure. Tricuspid valve regurgitation is trivial. No evidence of tricuspid stenosis. Aortic Valve: The aortic valve is normal in structure. Aortic valve regurgitation is not visualized. No aortic  stenosis is present. Pulmonic Valve: The pulmonic valve was normal in structure. Pulmonic valve regurgitation is not visualized. No evidence of pulmonic stenosis. Aorta: The aortic root is normal in size and structure. Venous: The inferior vena cava is normal in size with greater than 50% respiratory variability, suggesting right atrial pressure of 3 mmHg. IAS/Shunts: No atrial level shunt detected by color flow Doppler.  LEFT VENTRICLE PLAX 2D LVIDd:         5.60 cm      Diastology LVIDs:         4.20 cm      LV e' medial:    6.64 cm/s LV PW:         1.00 cm      LV E/e' medial:  9.3 LV IVS:        1.00 cm      LV e' lateral:   11.40 cm/s LVOT diam:     2.00 cm      LV E/e' lateral: 5.4 LV SV:         79 LV SV Index:   30 LVOT Area:     3.14 cm  LV Volumes (MOD) LV vol d, MOD A2C: 202.0 ml LV vol d, MOD A4C: 195.0 ml LV vol s, MOD A2C: 83.9 ml LV vol s, MOD A4C: 104.0 ml LV SV MOD A2C:     118.1 ml LV SV MOD A4C:     195.0 ml LV SV MOD BP:      106.5 ml RIGHT VENTRICLE RV S prime:     16.00 cm/s TAPSE (M-mode): 3.1 cm LEFT ATRIUM             Index       RIGHT ATRIUM           Index LA diam:        4.00 cm 1.50 cm/m  RA Area:     14.90 cm LA Vol (A2C):   48.3 ml 18.13 ml/m RA Volume:   34.80 ml  13.06 ml/m LA Vol (A4C):   43.4 ml 16.29 ml/m LA Biplane Vol: 45.7 ml 17.15 ml/m  AORTIC VALVE LVOT Vmax:    117.00 cm/s LVOT Vmean:  86.100 cm/s LVOT VTI:    0.252 m  AORTA Ao Root diam: 2.80 cm Ao Asc diam:  2.80 cm MITRAL VALVE MV Area (PHT): 2.66 cm    SHUNTS MV Decel Time: 285 msec    Systemic VTI:  0.25 m MV E velocity: 61.70 cm/s  Systemic Diam: 2.00 cm MV A velocity: 62.10 cm/s MV E/A ratio:  0.99 Donato Schultz MD Electronically signed by Donato Schultz MD Signature Date/Time: 01/13/2021/2:23:33 PM    Final      Subjective: No acute issues or events overnight   Discharge Exam: Vitals:   01/12/21 1847 01/13/21 0515  BP: 108/69 109/61  Pulse: 63 77  Resp: 18 18  Temp: 98.2 F (36.8 C) 98.8 F (37.1 C)  SpO2:  97%   Vitals:   01/12/21 1051 01/12/21 1435 01/12/21 1847 01/13/21 0515  BP: 113/75 113/66 108/69 109/61  Pulse: 63 66 63 77  Resp: 18 18 18 18   Temp: 98.5 F (36.9 C) 98.4 F (36.9 C) 98.2 F (36.8 C) 98.8 F (37.1 C)  TempSrc: Oral Oral    SpO2: 100% 96%  97%  Weight:      Height:        General: Pt is alert, awake, not in acute distress Cardiovascular: RRR, S1/S2 +,  no rubs, no gallops Respiratory: CTA bilaterally, no wheezing, no rhonchi Abdominal: Soft, obese, NT, ND, bowel sounds + Extremities: no edema, no cyanosis, right calf tender to palpation posteriorly    The results of significant diagnostics from this hospitalization (including imaging, microbiology, ancillary and laboratory) are listed below for reference.     Microbiology: Recent Results (from the past 240 hour(s))  Resp Panel by RT-PCR (Flu A&B, Covid) Nasopharyngeal Swab     Status: None   Collection Time: 01/12/21  2:25 AM   Specimen: Nasopharyngeal Swab; Nasopharyngeal(NP) swabs in vial transport medium  Result Value Ref Range Status   SARS Coronavirus 2 by RT PCR NEGATIVE NEGATIVE Final    Comment: (NOTE) SARS-CoV-2 target nucleic acids are NOT DETECTED.  The SARS-CoV-2 RNA is generally detectable in upper respiratory specimens during the acute phase of infection. The lowest concentration  of SARS-CoV-2 viral copies this assay can detect is 138 copies/mL. A negative result does not preclude SARS-Cov-2 infection and should not be used as the sole basis for treatment or other patient management decisions. A negative result may occur with  improper specimen collection/handling, submission of specimen other than nasopharyngeal swab, presence of viral mutation(s) within the areas targeted by this assay, and inadequate number of viral copies(<138 copies/mL). A negative result must be combined with clinical observations, patient history, and epidemiological information. The expected result is Negative.  Fact Sheet for Patients:  BloggerCourse.com  Fact Sheet for Healthcare Providers:  SeriousBroker.it  This test is no t yet approved or cleared by the [place] FDA and  has been authorized for detection and/or diagnosis of SARS-CoV-2 by FDA under an Emergency Use Authorization (EUA). This EUA will remain  in effect (meaning this test can be used) for the duration of the COVID-19 declaration under Section 564(b)(1) of the Act, 21 U.S.C.section 360bbb-3(b)(1), unless the authorization is terminated  or revoked sooner.       Influenza A by PCR NEGATIVE NEGATIVE Final   Influenza B by PCR NEGATIVE NEGATIVE Final    Comment: (NOTE) The Xpert Xpress SARS-CoV-2/FLU/RSV plus assay is intended as an aid in the diagnosis of influenza from Nasopharyngeal swab specimens and should not be used as a sole basis for treatment. Nasal washings and aspirates are unacceptable for Xpert Xpress SARS-CoV-2/FLU/RSV testing.  Fact Sheet for Patients: BloggerCourse.com  Fact Sheet for Healthcare Providers: SeriousBroker.it  This test is not yet approved or cleared by the [place] FDA and has been authorized for detection and/or diagnosis of SARS-CoV-2 by FDA under an Emergency Use  Authorization (EUA). This EUA will remain in effect (meaning this test can be used) for the duration of the COVID-19 declaration under Section 564(b)(1) of the Act, 21 U.S.C. section 360bbb-3(b)(1), unless the authorization is terminated or revoked.  Performed at [facility], [address] [place], [place] [address]      Labs: BNP (last 3 results) No results for input(s): BNP in the last 8760 hours. Basic Metabolic Panel: Recent Labs  Lab 01/08/21 1018 01/11/21 2321 01/13/21 0205  NA 137 140 140  K 4.2 3.7 3.6  CL 103 107 110  CO2 28 24 23   GLUCOSE 100* 95 134*  BUN 7 14 13   CREATININE 0.73 0.74 0.83  CALCIUM 9.1 9.6 9.3   Liver Function Tests: No results for input(s): AST, ALT, ALKPHOS, BILITOT, PROT, ALBUMIN in the last 168 hours. No results for input(s): LIPASE, AMYLASE in the last 168 hours. No results for input(s): AMMONIA in the  last 168 hours. CBC: Recent Labs  Lab 01/08/21 1018 01/11/21 2321 01/13/21 0205  WBC 12.9* 14.6* 16.1*  NEUTROABS 7.0 8.7*  --   HGB 13.4 13.8 13.5  HCT 39.4 39.2 37.8  MCV 82.1 80.2 79.2*  PLT 353 332 319   Cardiac Enzymes: No results for input(s): CKTOTAL, CKMB, CKMBINDEX, TROPONINI in the last 168 hours. BNP: Invalid input(s): POCBNP CBG: No results for input(s): GLUCAP in the last 168 hours. D-Dimer No results for input(s): DDIMER in the last 72 hours. Hgb A1c No results for input(s): HGBA1C in the last 72 hours. Lipid Profile No results for input(s): CHOL, HDL, LDLCALC, TRIG, CHOLHDL, LDLDIRECT in the last 72 hours. Thyroid function studies No results for input(s): TSH, T4TOTAL, T3FREE, THYROIDAB in the last 72 hours.  Invalid input(s): FREET3 Anemia work up No results for input(s): VITAMINB12, FOLATE, FERRITIN, TIBC, IRON, RETICCTPCT in the last 72 hours. Urinalysis    Component Value Date/Time   COLORURINE YELLOW 11/17/2018 1943   APPEARANCEUR HAZY (A) 11/17/2018 1943   LABSPEC 1.028  11/17/2018 1943   PHURINE 6.0 11/17/2018 1943   GLUCOSEU NEGATIVE 11/17/2018 1943   HGBUR NEGATIVE 11/17/2018 1943   HGBUR small 07/10/2008 1037   BILIRUBINUR NEGATIVE 11/17/2018 1943   BILIRUBINUR negative 11/13/2018 1345   KETONESUR NEGATIVE 11/17/2018 1943   PROTEINUR 30 (A) 11/17/2018 1943   UROBILINOGEN 0.2 11/13/2018 1345   UROBILINOGEN 1.0 12/03/2011 0137   NITRITE NEGATIVE 11/17/2018 1943   LEUKOCYTESUR TRACE (A) 11/17/2018 1943   Sepsis Labs Invalid input(s): PROCALCITONIN,  WBC,  LACTICIDVEN Microbiology Recent Results (from the past 240 hour(s))  Resp Panel by RT-PCR (Flu A&B, Covid) Nasopharyngeal Swab     Status: None   Collection Time: 01/12/21  2:25 AM   Specimen: Nasopharyngeal Swab; Nasopharyngeal(NP) swabs in vial transport medium  Result Value Ref Range Status   SARS Coronavirus 2 by RT PCR NEGATIVE NEGATIVE Final    Comment: (NOTE) SARS-CoV-2 target nucleic acids are NOT DETECTED.  The SARS-CoV-2 RNA is generally detectable in upper respiratory specimens during the acute phase of infection. The lowest concentration of SARS-CoV-2 viral copies this assay can detect is 138 copies/mL. A negative result does not preclude SARS-Cov-2 infection and should not be used as the sole basis for treatment or other patient management decisions. A negative result may occur with  improper specimen collection/handling, submission of specimen other than nasopharyngeal swab, presence of viral mutation(s) within the areas targeted by this assay, and inadequate number of viral copies(<138 copies/mL). A negative result must be combined with clinical observations, patient history, and epidemiological information. The expected result is Negative.  Fact Sheet for Patients:  BloggerCourse.com  Fact Sheet for Healthcare Providers:  SeriousBroker.it  This test is no t yet approved or cleared by the [place] FDA and  has been  authorized for detection and/or diagnosis of SARS-CoV-2 by FDA under an Emergency Use Authorization (EUA). This EUA will remain  in effect (meaning this test can be used) for the duration of the COVID-19 declaration under Section 564(b)(1) of the Act, 21 U.S.C.section 360bbb-3(b)(1), unless the authorization is terminated  or revoked sooner.       Influenza A by PCR NEGATIVE NEGATIVE Final   Influenza B by PCR NEGATIVE NEGATIVE Final    Comment: (NOTE) The Xpert Xpress SARS-CoV-2/FLU/RSV plus assay is intended as an aid in the diagnosis of influenza from Nasopharyngeal swab specimens and should not be used as a sole basis for treatment. Nasal washings and aspirates are  unacceptable for Xpert Xpress SARS-CoV-2/FLU/RSV testing.  Fact Sheet for Patients: BloggerCourse.com  Fact Sheet for Healthcare Providers: SeriousBroker.it  This test is not yet approved or cleared by the [place] FDA and has been authorized for detection and/or diagnosis of SARS-CoV-2 by FDA under an Emergency Use Authorization (EUA). This EUA will remain in effect (meaning this test can be used) for the duration of the COVID-19 declaration under Section 564(b)(1) of the Act, 21 U.S.C. section 360bbb-3(b)(1), unless the authorization is terminated or revoked.  Performed at [facility], [address] [place], [place] [address]      Time coordinating discharge: Over 30 minutes  SIGNED:   Azucena Fallen, DO [facility] 01/13/2021, 7:28 PM Pager   If 7PM-7AM, please contact night-coverage [email or web address]

## 2021-01-13 NOTE — Plan of Care (Signed)

## 2021-01-13 NOTE — Discharge Instructions (Signed)
Information on my medicine - Pradaxa® (dabigatran) ° ° °Why was Pradaxa® prescribed for you? °Pradaxa® was prescribed to treat blood clots that may have been found in the veins of your legs (deep vein thrombosis) or in your lungs (pulmonary embolism) and to reduce the risk of them occurring again.  Pradaxa® will take the place of the injectable anticoagulant medication you have been receiving for this condition for the last 5-10 days. ° °What do you Need to know about PradAXa®? °Take your Pradaxa® TWICE DAILY - one capsule in the morning and one tablet in the evening with or without food.  It would be best to take the doses about the same time each day. ° °The capsules should not be broken, chewed or opened - they must be swallowed whole. ° °Do not store Pradaxa in other medication containers - once the bottle is opened the Pradaxa should be used within FOUR months; throw away any capsules that haven’t been by that time. ° °Take Pradaxa® exactly as prescribed by your doctor.  DO NOT stop taking Pradaxa® without talking to the doctor who prescribed the medication.  Refill your prescription before you run out. ° °After discharge, you should have regular check-up appointments with your healthcare provider that is prescribing your Pradaxa®.  In the future your dose may need to be changed if your kidney function or weight changes by a significant amount. ° °What do you do if you miss a dose? °If you miss a dose, take it as soon as you remember on the same day.  If your next dose is less than 6 hours away, skip the missed dose.  Do not take two doses of PRADAXA at the same time. ° °Important Safety Information °A possible side effect of Pradaxa® is bleeding. You should call your healthcare provider right away if you experience any of the following: °Bleeding from an injury or your nose that does not stop. °Unusual colored urine (red or dark brown) or unusual colored stools (red or black). °Unusual bruising for unknown  reasons. °A serious fall or if you hit your head (even if there is no bleeding). ° °Some medicines may interact with Pradaxa® and might increase your risk of bleeding or clotting while on Pradaxa®. To help avoid this, consult your healthcare provider or pharmacist prior to using any new prescription or non-prescription medications, including herbals, vitamins, non-steroidal anti-inflammatory drugs (NSAIDs) and supplements. ° °This website has more information on Pradaxa® (dabigatran): [email or web address]. ° °

## 2021-01-13 NOTE — Progress Notes (Signed)
ANTICOAGULATION CONSULT NOTE  Pharmacy Consult for Heparin (holding warfarin) Indication: New onset PE, history of DVT and dural venous sinus thrombosis  Allergies  Allergen Reactions   Ampicillin Anaphylaxis, Shortness Of Breath and Swelling    Throat swells   Penicillins Anaphylaxis and Shortness Of Breath    Throat swelling Has patient had a PCN reaction causing immediate rash, facial/tongue/throat swelling, SOB or lightheadedness with hypotension: Yes Has patient had a PCN reaction causing severe rash involving mucus membranes or skin necrosis: No Has patient had a PCN reaction that required hospitalization: No Has patient had a PCN reaction occurring within the last 10 years: Yes If all of the above answers are "NO", then may proceed with Cephalosporin use.    Strawberry Extract Anaphylaxis, Hives, Itching and Swelling   Cephalexin Hives, Itching and Swelling   Prednisone Nausea And Vomiting, Rash and Other (See Comments)    Cramping in legs, could barely walk     Patient Measurements: Height: 5\' 9"  (175.3 cm) Weight: (!) 165.1 kg (364 lb) IBW/kg (Calculated) : Angelica.2 Heparin Dosing Weight: ~108  Vital Signs: Temp: 98.2 Brown (36.8 C) (09/26 1847) BP: 108/69 (09/26 1847) Pulse Rate: 63 (09/26 1847)  Labs: Recent Labs    01/11/21 2321 01/12/21 0922 01/12/21 1714 01/13/21 0205  HGB 13.8  --   --  13.5  HCT 39.2  --   --  37.8  PLT 332  --   --  319  LABPROT 16.0*  --   --   --   INR 1.3*  --   --   --   HEPARINUNFRC  --  0.13* 0.29* 0.40  CREATININE 0.74  --   --  0.83     Estimated Creatinine Clearance: 146 mL/min (by C-G formula based on SCr of 0.83 mg/dL).   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    acute dural venous sinus thrombosis and right mastoid effusion/notes 05/28/2016   GERD (gastroesophageal reflux disease)    Gestational diabetes    "w/all 4 pregnancies"   Heart murmur    Hypertension    Metrorrhagia    Migraine    "at least twice/month" (05/28/2016)   Nystagmus 06/30/2016   OBESITY, MORBID    POSTURAL LIGHTHEADEDNESS    Sickle cell trait (HCC)    STEMI (ST elevation myocardial infarction) (HCC) 11/09/2017   in 11/11/2017, [place] - see records in Care Everywhere   SYNCOPE    Throat pain    TOBACCO DEPENDENCE     Assessment: 45 y/o Brown with 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 She is found to have likely new onset PE per CT. Her INR is on admission is sub-therapeutic at 1.3. CBC/renal function good.   Today, 01/13/21  HL is therapeutic at 0.4 with heparin infusing at 2100 units/hr No bleeding or infusion related issues per RN CBC stable  Goal of Therapy:  Heparin level 0.3-0.7 units/ml Monitor platelets by anticoagulation protocol: Yes   Plan:  Continue heparin infusion at 2100 units/hr Recheck 6 hr Heparin level after increase in infusion Daily CBC/Heparin  level Monitor for bleeding and transition to oral anticoagulant   Junita Push, PharmD, BCPS Clinical Pharmacist [place] Please utilize Amion for appropriate phone number to reach the unit pharmacist [facility]) 01/13/2021 2:47 AM

## 2021-01-13 NOTE — Progress Notes (Signed)
  Echocardiogram 2D Echocardiogram has been performed.  [name] M 01/13/2021, 1:50 PM

## 2021-01-14 NOTE — Progress Notes (Signed)
Patient discharged home.  AVS reviewed and pt reported understanding.  Ambulated out, per her request.  Escorted per myself.  No s/s of distress noted upon her departure.

## 2021-01-14 NOTE — Progress Notes (Signed)
Per charge nurse report, pt requesting to either be discharged home or plans to leave AMA.  Charge nurse was sent to address the pt's concerns due to my being engaged with another patient.    Upon my assessment, the pt was found to be pleasant, but continued to voice desire to leave due to being overall unhappy with this hospitalization.  However, she has decided not to leave AMA and will wait for the discharge order from the physician.

## 2021-01-15 ENCOUNTER — Telehealth: Payer: Self-pay

## 2021-01-15 NOTE — Telephone Encounter (Signed)
Transition Care Management Follow-up Telephone Call Date of discharge and from where: 01/13/2021  [facility] How have you been since you were released from the hospital? "Fine" Any questions or concerns? No  Items Reviewed: Did the pt receive and understand the discharge instructions provided? Yes  Medications obtained and verified? Yes  Other? No  Any new allergies since your discharge? No  Dietary orders reviewed? Yes Do you have support at home? Yes   Home Care and Equipment/Supplies: Were home health services ordered? not applicable If so, what is the name of the agency?  Has the agency set up a time to come to the patient's home? not applicable Were any new equipment or medical supplies ordered?  No What is the name of the medical supply agency?  Were you able to get the supplies/equipment? not applicable Do you have any questions related to the use of the equipment or supplies? No  Functional Questionnaire: (I = Independent and D = Dependent) ADLs: I  Bathing/Dressing- I  Meal Prep- I  Eating- I  Maintaining continence- I  Transferring/Ambulation- I  Managing Meds- I  Follow up appointments reviewed:  PCP Hospital f/u appt confirmed? Yes  Scheduled to see PCP on 01/16/2021  @ 220pm. Specialist [facility] f/u appt confirmed? No   Are transportation arrangements needed? No  If their condition worsens, is the pt aware to call PCP or go to the Emergency Dept.? Yes Was the patient provided with contact information for the PCP's office or ED? Yes Was to pt encouraged to call back with questions or concerns? Yes  Rowe Pavy, RN, BSN, CEN [facility] [phone]

## 2021-01-15 NOTE — Progress Notes (Signed)
[facility] Telemedicine Visit  Patient consented to have virtual visit and was identified by name and date of birth. Method of visit: Telephone  Encounter participants: Patient: Angelica Brown - located at home Provider: Maury Dus - located at [facility] Others (if applicable):   Chief Complaint: Hospital follow-up  HPI: Admitted to [facility] from 9/26-9/27 due to pulmonary embolism (small PE in the left upper and lower lobes)- had initially presented with leg pain, redness. Hx of prior blood clots so she knew the signs. Patient has antiphospholipid syndrome, has failed multiple anticoagulants in the past. Was non-adherent with INR checks while on Coumadin. During admission she was transitioned from Coumadin to Pradaxa (via Heparin bridge). Reports she is doing well since discharge. No issues. Endorses compliance with Pradaxa (taking twice daily). She would like a work note saying she can return to work. Works in [employer], which is sedentary but patient aware of the need to move throughout the day to prevent clots. Also continues to smoke which she knows is a risk factor. She would like to schedule appt to discuss tobacco cessation.  Weight Management Patient was told she's not a candidate for bariatric surgery given her hx of multiple blood clots. She would like to make an appointment to discuss weight loss medication instead.  Pertinent PMHx: antiphospholipid syndrome, tobacco use, morbid obesity, MI, prediabetes  Exam:  There were no vitals taken for this visit.  Respiratory: normal effort, speaking in full sentences Psych: normal speech, appropriate thought content  Assessment/Plan:  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HCC) Doing well on Pradaxa since discharge from the hospital 3 days ago. -Emphasized importance of medication compliance -Cleared to return to work. Discussed the need to get up and move throughout the day -Patient is working on smoking  cessation, will schedule office appointment to discuss further if she's interested -Repeat CBC/BMP at next appointment per discharge summary recommendations (has chronic leukocytosis)   Weight Management Patient to schedule office appointment to discuss her weight. Would recommend medication therapy (excellent candidate for Ozempic due to morbid obesity, CHF, and prediabetes). Can also consider referral to healthy weight & wellness   Time spent during visit with patient: 18 minutes  Maury Dus, MD PGY-2 [facility]

## 2021-01-16 ENCOUNTER — Other Ambulatory Visit: Payer: Self-pay

## 2021-01-16 ENCOUNTER — Telehealth (INDEPENDENT_AMBULATORY_CARE_PROVIDER_SITE_OTHER): Payer: Federal, State, Local not specified - PPO | Admitting: Family Medicine

## 2021-01-16 DIAGNOSIS — D6861 Antiphospholipid syndrome: Secondary | ICD-10-CM

## 2021-01-16 DIAGNOSIS — I2699 Other pulmonary embolism without acute cor pulmonale: Secondary | ICD-10-CM | POA: Diagnosis not present

## 2021-01-18 ENCOUNTER — Encounter: Payer: Self-pay | Admitting: Family Medicine

## 2021-01-18 NOTE — Assessment & Plan Note (Signed)
Doing well on Pradaxa since discharge from the hospital 3 days ago. -Emphasized importance of medication compliance -Cleared to return to work. Discussed the need to get up and move throughout the day -Patient is working on smoking cessation, will schedule office appointment to discuss further if she's interested -Repeat CBC/BMP at next appointment per discharge summary recommendations (has chronic leukocytosis)

## 2021-02-24 ENCOUNTER — Ambulatory Visit: Payer: Federal, State, Local not specified - PPO | Admitting: Family Medicine

## 2021-03-03 ENCOUNTER — Other Ambulatory Visit: Payer: Self-pay

## 2021-03-03 ENCOUNTER — Encounter: Payer: Self-pay | Admitting: Family Medicine

## 2021-03-03 ENCOUNTER — Ambulatory Visit (INDEPENDENT_AMBULATORY_CARE_PROVIDER_SITE_OTHER): Payer: Federal, State, Local not specified - PPO | Admitting: Family Medicine

## 2021-03-03 DIAGNOSIS — I10 Essential (primary) hypertension: Secondary | ICD-10-CM | POA: Diagnosis not present

## 2021-03-03 DIAGNOSIS — Z23 Encounter for immunization: Secondary | ICD-10-CM | POA: Diagnosis not present

## 2021-03-03 DIAGNOSIS — D6861 Antiphospholipid syndrome: Secondary | ICD-10-CM

## 2021-03-03 DIAGNOSIS — H029 Unspecified disorder of eyelid: Secondary | ICD-10-CM | POA: Diagnosis not present

## 2021-03-03 MED ORDER — SEMAGLUTIDE-WEIGHT MANAGEMENT 0.25 MG/0.5ML ~~LOC~~ SOAJ
0.2500 mg | SUBCUTANEOUS | 0 refills | Status: DC
Start: 2021-03-03 — End: 2021-05-20

## 2021-03-03 NOTE — Progress Notes (Signed)
SUBJECTIVE:   CHIEF COMPLAINT / HPI:   Angelica Brown (MRN: [id number]) is a 45 y.o. female with a history of CHF, MI, pulmonary emboli, HTN, prediabetes, obesity, and antiphospholipid antibody syndrome who presents to discuss weight loss.  Weight loss Patient has spoken with her cardiologist about having bariatric surgery. They recommended no surgery given her history of coagulopathy. They also advised her to come in to get medications to lose weight. She called her insurance company who said due to her existing conditions, Ozempic would likely be covered. She has also been consistently walking, though she feels she needs to do more exercise but can't due to her CHF. She has been trying to eat better as well, though at times it is hard to eat on schedule and she ends up eating the first meal she can fix due to her hunger. She is also interested in seeing [facility] to help discuss her diet and exercise strategies.  Lesion on eyelid Patient reports a lesion on her right eyelid. She states that over time, it has gotten larger. It did not used to be painful, but it has become tender. She tried to drain it with a heated needle, but it was solid and did not drain. She would like to get it evaluated and removed.  Coagulopathy Patient is taking her dabigatran 150 mg twice daily and prasugrel 10 mg daily as prescribed. She understands the importance of continuing these medications due to her increased susceptibility to clots.  HTN Patient is taking metoprolol tartrate 25 mg as prescribed, and she is experiencing good BP control.  OBJECTIVE:   BP 126/60   Pulse 60   Wt (!) 355 lb 3.2 oz (161.1 kg)   SpO2 97%   BMI 52.45 kg/m    PHYSICAL EXAM  GEN: Well-developed, in NAD, conversant HEAD: NCAT EENT: 4-74mm papular lesion on right upper eyelid toward lateral commisure CVS: RRR, normal S1/S2, no murmurs, rubs, gallops RESP: Breathing comfortably on RA, no  retractions, wheezes, rhonchi, or crackles ABD: Non-distended EXT: Moves all extremities grossly equally    ASSESSMENT/PLAN:   OBESITY, MORBID Patient's BMI of 52.45, prediabetes, and CHF make her a good candidate for semaglutide therapy. Discussed administration, tapering, and side effects, and she is amenable to beginning therapy. Start with 0.25mg  injected weekly. Will schedule follow up in 4 weeks with pharmacy to assess efficacy and tolerability, and titrate upwards. Also recommended she call [facility] to set up an appointment to discuss building improved diet and exercise habits.  Hypertension Patient's BP today is 126/60. She is tolerating her medications well. Continue current regimen as prescribed.  Antiphospholipid antibody syndrome [facility]) Patient is taking her medications as prescribed and is tolerating them well. Continue current regimen to prevent clotting.   Lesion on right upper eyelid Unsure of lesion etiology. Appearance is not consistent with stye, may be simple skin tag. Will send in referral to ophthalmology for further evaluation and treatment given location.  Health maintenance Patient received the flu vaccine today without incident. She declined the COVID-19 vaccine, as she did not want both vaccines at the same time.  Samantha Crimes, Medical Student [facility]  Patient seen along with MS3 student [facility]. I personally evaluated this patient along with the student, and verified all aspects of the history, physical exam, and medical decision making as documented by the student. I agree with the student's documentation and have made all necessary edits.  Levert Feinstein, MD  [facility]

## 2021-03-03 NOTE — Assessment & Plan Note (Addendum)
Patient's BMI of 52.45, prediabetes, and CHF make her a good candidate for semaglutide therapy. Discussed administration, tapering, and side effects, and she is amenable to beginning therapy. Start with 0.25mg  injected weekly. Will schedule follow up in 4 weeks with pharmacy to assess efficacy and tolerability, and titrate upwards. Also recommended she call [facility] to set up an appointment to discuss building improved diet and exercise habits.

## 2021-03-03 NOTE — Assessment & Plan Note (Signed)
Patient is taking her medications as prescribed and is tolerating them well. Continue current regimen to prevent clotting.

## 2021-03-03 NOTE — Assessment & Plan Note (Signed)
Patient's BP today is 126/60. She is tolerating her medications well. Continue current regimen as prescribed.

## 2021-03-03 NOTE — Patient Instructions (Addendum)
Sent in semaglutide for you Please follow up in 4 weeks with Cheral Almas, pharmacist here at the [facility]  Referring to eye doctor for your eyelid issue  Call the [facility] to set up an appointment for weight management. Their number is [phone].   Flu shot today. Schedule appointment for your COVID booster.  Be well, Dr. Pollie Meyer

## 2021-03-17 ENCOUNTER — Emergency Department (HOSPITAL_COMMUNITY)
Admission: EM | Admit: 2021-03-17 | Discharge: 2021-03-18 | Disposition: A | Discharge status: Home / Self Care | Payer: Federal, State, Local not specified - PPO | Attending: Emergency Medicine | Admitting: Emergency Medicine

## 2021-03-17 ENCOUNTER — Encounter (HOSPITAL_COMMUNITY): Payer: Self-pay | Admitting: *Deleted

## 2021-03-17 ENCOUNTER — Emergency Department (HOSPITAL_COMMUNITY): Payer: Federal, State, Local not specified - PPO

## 2021-03-17 DIAGNOSIS — Z7901 Long term (current) use of anticoagulants: Secondary | ICD-10-CM | POA: Insufficient documentation

## 2021-03-17 DIAGNOSIS — R079 Chest pain, unspecified: Secondary | ICD-10-CM | POA: Diagnosis not present

## 2021-03-17 DIAGNOSIS — I2510 Atherosclerotic heart disease of native coronary artery without angina pectoris: Secondary | ICD-10-CM | POA: Diagnosis not present

## 2021-03-17 DIAGNOSIS — F17210 Nicotine dependence, cigarettes, uncomplicated: Secondary | ICD-10-CM | POA: Insufficient documentation

## 2021-03-17 DIAGNOSIS — I5022 Chronic systolic (congestive) heart failure: Secondary | ICD-10-CM | POA: Insufficient documentation

## 2021-03-17 DIAGNOSIS — R0602 Shortness of breath: Secondary | ICD-10-CM | POA: Diagnosis not present

## 2021-03-17 DIAGNOSIS — R059 Cough, unspecified: Secondary | ICD-10-CM | POA: Diagnosis not present

## 2021-03-17 DIAGNOSIS — I110 Hypertensive heart disease with heart failure: Secondary | ICD-10-CM | POA: Diagnosis not present

## 2021-03-17 DIAGNOSIS — Z79899 Other long term (current) drug therapy: Secondary | ICD-10-CM | POA: Insufficient documentation

## 2021-03-17 DIAGNOSIS — R0600 Dyspnea, unspecified: Secondary | ICD-10-CM

## 2021-03-17 LAB — CBC
HCT: 39.6 % (ref 36.0–46.0)
Hemoglobin: 13.6 g/dL (ref 12.0–15.0)
MCH: 28.8 pg (ref 26.0–34.0)
MCHC: 34.3 g/dL (ref 30.0–36.0)
MCV: 83.7 fL (ref 80.0–100.0)
Platelets: 314 10*3/uL (ref 150–400)
RBC: 4.73 MIL/uL (ref 3.87–5.11)
RDW: 15.6 % — ABNORMAL HIGH (ref 11.5–15.5)
WBC: 14.9 10*3/uL — ABNORMAL HIGH (ref 4.0–10.5)
nRBC: 0 % (ref 0.0–0.2)

## 2021-03-17 LAB — BASIC METABOLIC PANEL
Anion gap: 8 (ref 5–15)
BUN: 14 mg/dL (ref 6–20)
CO2: 26 mmol/L (ref 22–32)
Calcium: 8.9 mg/dL (ref 8.9–10.3)
Chloride: 107 mmol/L (ref 98–111)
Creatinine, Ser: 1 mg/dL (ref 0.44–1.00)
GFR, Estimated: >60 mL/min (ref >60)
Glucose, Bld: 92 mg/dL (ref 70–99)
Potassium: 3.6 mmol/L (ref 3.5–5.1)
Sodium: 141 mmol/L (ref 135–145)

## 2021-03-17 LAB — BRAIN NATRIURETIC PEPTIDE: B Natriuretic Peptide: 206 pg/mL — ABNORMAL HIGH (ref 0.0–100.0)

## 2021-03-17 LAB — TROPONIN I (HIGH SENSITIVITY)
Troponin I (High Sensitivity): 3 ng/L (ref <18)
Troponin I (High Sensitivity): 4 ng/L (ref <18)

## 2021-03-17 NOTE — ED Triage Notes (Signed)
Shortness of breath, history of MI

## 2021-03-18 NOTE — ED Provider Notes (Signed)
[facility] EMERGENCY DEPARTMENT Provider Note   CSN: [id number] Arrival date & time: 03/17/21  1838     History Chief Complaint  Patient presents with   Cough   Shortness of Breath    Angelica Brown is a 45 y.o. female.  Presents to the emergency room with concern for shortness of breath.  States that she noted today that she felt to be a little bit more short of breath than normal, did get worse with exertion and improved with rest.  Had noted slight discomfort in her throat.  Does not have any jaw pain or chest pain or arm pain.  Currently does not feel short of breath.  Patient has quite extensive past medical history including history of myocardial infarction, CAD, pulmonary embolism, congestive heart failure.  Patient reports that is compliant with her anticoagulation and has had no recent missed doses.  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    acute dural venous sinus thrombosis and right mastoid effusion/notes 05/28/2016   GERD (gastroesophageal reflux disease)    Gestational diabetes    "w/all 4 pregnancies"   Heart murmur    Hypertension    Metrorrhagia    Migraine    "at least twice/month" (05/28/2016)   Nystagmus 06/30/2016   OBESITY, MORBID    POSTURAL LIGHTHEADEDNESS    Sickle cell trait ([place])    STEMI (ST elevation myocardial infarction) ([place]) 11/09/2017   in [place], [place] - see records in [place]    Throat pain    TOBACCO DEPENDENCE     Patient Active Problem List   Diagnosis Date Noted   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[place]) 01/12/2021   Tonsillolith 12/09/2020   Chronic anticoagulation  09/24/2020   Hypertension 05/27/2020   Lateral epicondylitis 04/30/2020   Prediabetes 11/27/2019   Pelvic pain 08/06/2019   OSA (obstructive sleep apnea) 08/06/2019   Left leg pain 03/22/2019   Acute left-sided low back pain without sciatica 11/21/2018   Left flank pain 11/14/2018   Hx of Inf MI (7/19 Tx with BMS to RCA; stent thrombosis 9/19 tx with DES to RCA; stent thrombosis 04/2018 tx with thrombectomy) 05/04/2018   Bradycardia 04/20/2018   History of MI (myocardial infarction) 04/20/2018   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/01/2016   Seborrheic dermatitis 10/27/2016   Nystagmus [id number]   Chronic systolic CHF (congestive heart failure) ([place]) 06/13/2016   Acute stress reaction 06/09/2016   Mastoiditis of right side 05/29/2016   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   Headache 05/26/2016   Family history of breast cancer 01/17/2013   Sickle cell trait ([place])    Dizziness 08/29/2008   OBESITY, MORBID 07/10/2008   TOBACCO DEPENDENCE 06/16/2006   METRORRHAGIA 06/16/2006    Past Surgical History:  Procedure Laterality Date   BARTHOLIN GLAND CYST EXCISION Bilateral 2004   CORONARY THROMBECTOMY N/A 05/04/2018   Procedure: Coronary Thrombectomy;  Surgeon: Belva Crome, MD;  Location: [facility];  Service: Cardiovascular;  Laterality: N/A;   EXCISIONAL HEMORRHOIDECTOMY  2006   "removed a blood clot from one"   LEFT HEART CATH AND  CORONARY ANGIOGRAPHY N/A 05/04/2018   Procedure: LEFT HEART CATH AND CORONARY ANGIOGRAPHY;  Surgeon: Belva Crome, MD;  Location: [facility];  Service: Cardiovascular;  Laterality: N/A;   TUBAL LIGATION  2007   WISDOM TOOTH EXTRACTION  1998     OB History     Gravida  5   Para  5   Term  4   Preterm  1   AB      Living  4      SAB      IAB      Ectopic      Multiple  1   Live Births              Family History  Problem Relation Age of Onset   Breast cancer Mother    Cancer Father         MELANOMA   Diabetes Father    Diabetes Paternal Grandmother    Diabetes Paternal Grandfather    Breast cancer Maternal Aunt    Breast cancer Paternal Aunt     Social History   Tobacco Use   Smoking status: Every Day    Packs/day: 1.00    Years: 18.00    Pack years: 18.00    Types: Cigarettes   Smokeless tobacco: Never   Tobacco comments:    Currently 6 cigs/day  Vaping Use   Vaping Use: Never used  Substance Use Topics   Alcohol use: No   Drug use: No    Home Medications Prior to Admission medications   Medication Sig Start Date End Date Taking? Authorizing Provider  acetaminophen (TYLENOL) 500 MG tablet Take 500-1,000 mg by mouth every 6 (six) hours as needed for mild pain or headache.   Yes [provider]  dabigatran (PRADAXA) 150 MG CAPS capsule Take 1 capsule (150 mg total) by mouth every 12 (twelve) hours. 01/13/21  Yes Little Ishikawa, MD  metoprolol tartrate (LOPRESSOR) 25 MG tablet TAKE 1/2 (ONE-HALF) TABLET BY MOUTH ONCE DAILY WITH BREAKFAST Patient taking differently: Take 25 mg by mouth daily. 01/18/20  Yes Leeanne Rio, MD  nitroGLYCERIN (NITROSTAT) 0.4 MG SL tablet Place 1 tablet (0.4 mg total) under the tongue every 5 (five) minutes x 3 doses as needed for chest pain. 12/09/20  Yes Leeanne Rio, MD  OZEMPIC, 0.25 OR 0.5 MG/DOSE, 2 MG/1.5ML SOPN Inject 0.25 mg into the skin once a week. 03/06/21  Yes [provider]  prasugrel (EFFIENT) 10 MG TABS tablet Take 1 tablet (10 mg total) by mouth daily. 11/28/20  Yes Leeanne Rio, MD  rosuvastatin (CRESTOR) 40 MG tablet Take 1 tablet (40 mg total) by mouth daily. 11/24/20  Yes Leeanne Rio, MD  topiramate (TOPAMAX) 50 MG tablet Take 1 tablet (50 mg total) by mouth daily. 11/24/20  Yes Leeanne Rio, MD  omeprazole (PRILOSEC) 40 MG capsule Take 40 mg by mouth at bedtime. Patient not taking: Reported on 03/17/2021    [provider]  Semaglutide-Weight  Management 0.25 MG/0.5ML SOAJ Inject 0.25 mg into the skin once a week. Patient not taking: Reported on 03/17/2021 03/03/21   Leeanne Rio, MD  XARELTO 20 MG TABS tablet Take 1 tablet by mouth once daily 07/09/20 07/11/20  Nicholas Lose, MD    Allergies    Ampicillin, Penicillins, Strawberry extract, Cephalexin, and Prednisone  Review of Systems   Review of Systems  Constitutional:  Negative for chills and fever.  HENT:  Negative for  ear pain and sore throat.   Eyes:  Negative for pain and visual disturbance.  Respiratory:  Positive for shortness of breath. Negative for cough.   Cardiovascular:  Negative for chest pain and palpitations.  Gastrointestinal:  Negative for abdominal pain and vomiting.  Genitourinary:  Negative for dysuria and hematuria.  Musculoskeletal:  Negative for arthralgias and back pain.  Skin:  Negative for color change and rash.  Neurological:  Negative for seizures and syncope.  All other systems reviewed and are negative.  Physical Exam Updated Vital Signs BP 122/62   Pulse 61   Temp 97.6 F (36.4 C) (Oral)   Resp (!) 23   SpO2 95%   Physical Exam Vitals and nursing note reviewed.  Constitutional:      General: She is not in acute distress.    Appearance: She is well-developed.  HENT:     Head: Normocephalic and atraumatic.  Eyes:     Conjunctiva/sclera: Conjunctivae normal.  Cardiovascular:     Rate and Rhythm: Normal rate and regular rhythm.     Heart sounds: No murmur heard. Pulmonary:     Effort: Pulmonary effort is normal. No respiratory distress.     Breath sounds: Normal breath sounds.  Abdominal:     Palpations: Abdomen is soft.     Tenderness: There is no abdominal tenderness.  Musculoskeletal:        General: No swelling.     Cervical back: Neck supple.  Skin:    General: Skin is warm and dry.     Capillary Refill: Capillary refill takes less than 2 seconds.  Neurological:     General: No focal deficit present.      Mental Status: She is alert.  Psychiatric:        Mood and Affect: Mood normal.    ED Results / Procedures / Treatments   Labs (all labs ordered are listed, but only abnormal results are displayed) Labs Reviewed  CBC - Abnormal; Notable for the following components:      Result Value   WBC 14.9 (*)    RDW 15.6 (*)    All other components within normal limits  BRAIN NATRIURETIC PEPTIDE - Abnormal; Notable for the following components:   B Natriuretic Peptide 206.0 (*)    All other components within normal limits  BASIC METABOLIC PANEL  POC URINE PREG, ED  TROPONIN I (HIGH SENSITIVITY)  TROPONIN I (HIGH SENSITIVITY)    EKG EKG Interpretation  Date/Time:  Tuesday March 17 2021 19:20:22 EST Ventricular Rate:  63 PR Interval:  159 QRS Duration: 80 QT Interval:  456 QTC Calculation: 467 R Axis:   18 Text Interpretation: Sinus rhythm LAE, consider biatrial enlargement Abnormal inferior Q waves Consider anterolateral infarct Baseline wander in lead(s) V3 Confirmed by Marianna Fuss ([id number]) on 03/18/2021 12:43:08 AM  Radiology DG Chest 2 View  Result Date: 03/17/2021 CLINICAL DATA:  Chest pain. EXAM: CHEST - 2 VIEW COMPARISON:  Chest radiograph dated 01/11/2021. FINDINGS: There is vascular congestion and edema. No focal consolidation, pleural effusion, pneumothorax. The cardiac silhouette is within limits. No acute osseous pathology. IMPRESSION: Vascular congestion and edema. No focal consolidation. Electronically Signed   By: Elgie Collard M.D.   On: 03/17/2021 19:26    Procedures Procedures   Medications Ordered in ED Medications - No data to display  ED Course  I have reviewed the triage vital signs and the nursing notes.  Pertinent labs & imaging results that were available during my care of the  patient were reviewed by me and considered in my medical decision making (see chart for details).    MDM Rules/Calculators/A&P                           45 year old  lady with quite extensive past medical history including heart failure, CAD, dural venous sinus thrombosis, pulmonary embolism presents to ER with concern for shortness of breath.  On physical exam, patient appears well in no distress.  Her lungs are clear, her vital signs are within normal limits.  Chest x-ray without focal consolidation but cardiologist did comment on some vascular congestion.  Her legs are not overtly edematous, BNP is only minimally elevated.  EKG appears unchanged from prior and troponin x2 is within normal limits.  No associated chest pain and given these findings I have a lower clinical suspicion for ACS.  On reassessment, patient denies any ongoing symptoms and her vital signs remained stable.  1 item on differential that was considered is recurrent pulmonary embolism.  Discussed with patient that unless we complete a CT scan or VQ scan we cannot completely rule out recurrent PE. CT scan is not available at this facility this evening however.  Offered transfer to another ER to have this completed versus further observation at home with strict return precautions and continuing Pradaxa and close out pt f/u.  Patient requesting discharge at present.  Given I have a low clinical suspicion for this diagnosis and her apparent clinical stability at present, already on Pradaxa, feel this is a reasonable option.  She did demonstrate good understanding of need for follow-up with her regular doctors and cardiologist and demonstrated good understanding of return precautions.  Final Clinical Impression(s) / ED Diagnoses Final diagnoses:  Dyspnea, unspecified type    Rx / DC Orders ED Discharge Orders     None        Lucrezia Starch, MD 03/18/21 [phone]

## 2021-03-18 NOTE — Discharge Instructions (Signed)
Please follow-up with both your primary care doctor and your cardiologist.  Discussed the symptoms you are experiencing today.  Please return immediately to the ER if you develop any chest discomfort or worsening difficulty in breathing or other new concerning symptom.

## 2021-04-02 ENCOUNTER — Ambulatory Visit: Payer: Federal, State, Local not specified - PPO

## 2021-04-02 ENCOUNTER — Ambulatory Visit: Payer: Federal, State, Local not specified - PPO | Admitting: Pharmacist

## 2021-04-08 ENCOUNTER — Ambulatory Visit: Payer: Federal, State, Local not specified - PPO | Admitting: Pharmacist

## 2021-04-08 ENCOUNTER — Ambulatory Visit: Payer: Federal, State, Local not specified - PPO

## 2021-04-29 ENCOUNTER — Ambulatory Visit: Payer: Federal, State, Local not specified - PPO

## 2021-04-29 ENCOUNTER — Ambulatory Visit: Payer: Federal, State, Local not specified - PPO | Admitting: Pharmacist

## 2021-05-04 ENCOUNTER — Ambulatory Visit: Payer: Federal, State, Local not specified - PPO | Admitting: Pharmacist

## 2021-05-04 ENCOUNTER — Ambulatory Visit: Payer: Federal, State, Local not specified - PPO

## 2021-05-18 ENCOUNTER — Ambulatory Visit: Payer: Federal, State, Local not specified - PPO | Admitting: Family Medicine

## 2021-05-19 ENCOUNTER — Ambulatory Visit: Payer: Federal, State, Local not specified - PPO | Admitting: Student

## 2021-05-20 ENCOUNTER — Other Ambulatory Visit (HOSPITAL_COMMUNITY)
Admission: RE | Admit: 2021-05-20 | Discharge: 2021-05-20 | Disposition: A | Discharge status: Home / Self Care | Payer: Federal, State, Local not specified - PPO | Source: Ambulatory Visit | Attending: Family Medicine | Admitting: Family Medicine

## 2021-05-20 ENCOUNTER — Other Ambulatory Visit: Payer: Self-pay

## 2021-05-20 ENCOUNTER — Ambulatory Visit (INDEPENDENT_AMBULATORY_CARE_PROVIDER_SITE_OTHER): Payer: Federal, State, Local not specified - PPO | Admitting: Student

## 2021-05-20 ENCOUNTER — Encounter: Payer: Self-pay | Admitting: Student

## 2021-05-20 ENCOUNTER — Ambulatory Visit (INDEPENDENT_AMBULATORY_CARE_PROVIDER_SITE_OTHER): Payer: Federal, State, Local not specified - PPO | Admitting: Pharmacist

## 2021-05-20 VITALS — BP 128/69 | HR 70 | Ht 69.0 in | Wt 372.0 lb

## 2021-05-20 DIAGNOSIS — M722 Plantar fascial fibromatosis: Secondary | ICD-10-CM | POA: Insufficient documentation

## 2021-05-20 DIAGNOSIS — Z113 Encounter for screening for infections with a predominantly sexual mode of transmission: Secondary | ICD-10-CM | POA: Insufficient documentation

## 2021-05-20 DIAGNOSIS — R309 Painful micturition, unspecified: Secondary | ICD-10-CM

## 2021-05-20 DIAGNOSIS — R7303 Prediabetes: Secondary | ICD-10-CM | POA: Diagnosis not present

## 2021-05-20 LAB — POCT URINALYSIS DIP (MANUAL ENTRY)
Bilirubin, UA: NEGATIVE
Blood, UA: NEGATIVE
Glucose, UA: NEGATIVE mg/dL
Ketones, POC UA: NEGATIVE mg/dL
Leukocytes, UA: NEGATIVE
Nitrite, UA: NEGATIVE
Protein Ur, POC: 30 mg/dL — AB
Spec Grav, UA: 1.025 (ref 1.010–1.025)
Urobilinogen, UA: 0.2 E.U./dL
pH, UA: 5.5 (ref 5.0–8.0)

## 2021-05-20 LAB — POCT UA - MICROSCOPIC ONLY

## 2021-05-20 LAB — POCT WET PREP (WET MOUNT)
Clue Cells Wet Prep Whiff POC: NEGATIVE
Trichomonas Wet Prep HPF POC: ABSENT

## 2021-05-20 LAB — POCT GLYCOSYLATED HEMOGLOBIN (HGB A1C): HbA1c, POC (prediabetic range): 5.7 % (ref 5.7–6.4)

## 2021-05-20 MED ORDER — OZEMPIC (0.25 OR 0.5 MG/DOSE) 2 MG/1.5ML ~~LOC~~ SOPN: 0.5000 mg | PEN_INJECTOR | SUBCUTANEOUS | 0 refills | Status: DC

## 2021-05-20 NOTE — Progress Notes (Signed)
° ° °  SUBJECTIVE:   CHIEF COMPLAINT / HPI:   STI Testing Patient is a 46 y.o. female presenting for STI screening.  She denies any vaginal discharge, odor. Has had some painful urination x2 weeks. No blood in urine. No fever or chills. She is interested in screening for sexually transmitted infections today since her current partner may have had previous STI infection from other partners. She menstruates, but has a history of bilateral tubal ligation and no concern for pregnancy.  Foot pain Worse in morning. Located on dorsum of foot. Spoke with friend who is a PA and said it is likely plantar fasciitis. Has tried multiple remedies of stretching and rolling with ice.  PERTINENT  PMH / PSH: T2DM  OBJECTIVE:   BP 128/69    Pulse 70    Ht 5\' 9"  (1.753 m)    Wt (!) 372 lb (168.7 kg)    BMI 54.93 kg/m    General: NAD, pleasant, able to participate in exam Respiratory: Normal effort, no obvious respiratory distress Pelvic: VULVA: normal appearing vulva with no masses, tenderness or lesions, VAGINA: Normal appearing vagina with normal color, no lesions, with no discharge. No lesions noted. CERVIX: No lesions, scant, white, and thick discharge present.  Chaperone (CMA) was present for pelvic exam  ASSESSMENT/PLAN:   Plantar fasciitis of right foot Likely plantar fasciitis of right foot given hx of symptoms, worse pain in the morning. Recommended night splint trial in addition to current stretching/ice/heat. Should pain worsen, she can come back for appointment to discuss further treatment options.  Assessment:  46 y.o. female with dysuria x2 weeks, but no vaginal odor or discharge, desiring STI screening.  Physical exam significant for scant white discharge.  Wet prep performed today shows no yeast, BV (rare partial clue cells), or trichomonas. Urinalysis negative for infection. Patient desired full STI screening, which I will follow up on. Plan: -Wet prep as above.  No treatment  required. -GC/chlamydia pending -Will check HIV and RPR  60, DO [place]

## 2021-05-20 NOTE — Patient Instructions (Signed)
Angelica Brown it was a pleasure seeing you today!  Today we discussed a plan for weight management to work towards your goal of getting to around 200 lbs  INCREASE Ozempic to 0.5mg  once weekly Exercise goal: walk dog for longer and consider purchasing ankle weights for added resistance Diet goal: substitute sugary snacks for healthier snacks  Follow-up with provider in four weeks to titrate up medication  If you have any questions or concerns before your next appointment please call the clinic

## 2021-05-20 NOTE — Assessment & Plan Note (Signed)
Likely plantar fasciitis of right foot given hx of symptoms, worse pain in the morning. Recommended night splint trial in addition to current stretching/ice/heat. Should pain worsen, she can come back for appointment to discuss further treatment options.

## 2021-05-20 NOTE — Progress Notes (Signed)
° °  Subjective:    Patient ID: Angelica Brown, female    DOB: 1975-10-14, 46 y.o.   MRN: [id number]  HPI Patient is a 46 y.o. female who presents for initial weight management follow up. She is in good spirits and presents without assistance.  She initiated use of Ozempic 5 weeks ago. Denies any undesirable side effects and reports compliance with medications.   Current meal patterns: Breakfast:skips Lunch: cereal or breakfast sandwich Supper: protein, carbs, veggie Snacks: chips, sweets Drinks:40 oz of sweet tea, water Current exercise: walks dog several times a day Previously tried weight management pharmacotherapy: none Current weight management pharmacotherapy: Ozempic 0.25mg  once weekly on Thursdays Patient states that She Is/is not: is taking his/her/their: her diabetes medications as prescribed. Patient reports adherence with medications. Barriers to weight loss: consistency     Objective:   Vitals with BMI 05/20/2021 05/20/2021 03/18/2021  Height 5\' 9"  - -  Weight 372 lbs 372 lbs 6 oz -  BMI 96.92 - -  Systolic 493 - 96  Diastolic 69 - 61  Pulse 70 - 70     Lab Results  Component Value Date   HGBA1C 5.7 05/20/2021   HGBA1C 6.1 (A) 05/27/2020   HGBA1C 5.7 11/22/2019    Assessment/Plan:   Patient has not met goal of at least 5% of body weight loss with comprehensive lifestyle modifications alone in the past 3-6 months. Pharmacotherapy is appropriate to pursue as augmentation. Will titrate up Ozempic to 0.5mg  once weekly as patient has completed 4 doses of 0.25mg .  Following instruction patient verbalized understanding of treatment plan.    Increased dose of Ozempic to 0.5mg  once weekly. Titrate up every four weeks as appropriate. Over the next 180 days, patient will work with PharmD and primary care provider to work towards 5-10% body weight loss Comprehensive medication review performed, medication list updated in electronic medical record Patient will adhere to  dietary modifications Patient will exercise 1-2x/week with goal to increase towards target of at least 150 minutes of moderate intensity exercise weekly Patient will report any questions or concerns to provider

## 2021-05-20 NOTE — Patient Instructions (Addendum)
It was great to meet you, Angelica Brown.  Your foot pain is likely plantar fasciitis. Make sure to wear supportive shoes with arch support, or buy inserts for your shoes specifically for this condition.  Try wearing a foot splint at night as we discussed. Let me know if your pain does not improve.  We screened you for STIs today. You are negative for yeast infection and bacterial vaginosis and urinary tract infection(which means you do NOT have them). I will call you if any of your other results are abnormal.  Best wishes, Dr. Melissa Noon

## 2021-05-21 ENCOUNTER — Telehealth: Payer: Self-pay

## 2021-05-21 ENCOUNTER — Ambulatory Visit: Payer: Federal, State, Local not specified - PPO

## 2021-05-21 NOTE — Telephone Encounter (Signed)
Patient calls nurse line reporting continued UTI symptoms. Patient reports she was seen in office yesterday, however her urine came back "clean." Patient reports urinary frequency, dysuria, strong urine odor and dark in color. Patient reports she has been drinking plenty of water, however no relief. Patient denies flank pain, fever/chills or abdominal pain.    Patient requesting an antibiotic. As of now we have no apts until next week.   Will forward to provider who saw patient.

## 2021-05-22 LAB — HIV ANTIBODY (ROUTINE TESTING W REFLEX): HIV Screen 4th Generation wRfx: NONREACTIVE

## 2021-05-22 LAB — CERVICOVAGINAL ANCILLARY ONLY
Chlamydia: NEGATIVE
Comment: NEGATIVE
Comment: NORMAL
Neisseria Gonorrhea: NEGATIVE

## 2021-05-22 LAB — RPR: RPR Ser Ql: REACTIVE — AB

## 2021-05-22 LAB — RPR, QUANT+TP ABS (REFLEX)
Rapid Plasma Reagin, Quant: 1:2 {titer} — ABNORMAL HIGH
T Pallidum Abs: NONREACTIVE

## 2021-05-22 MED ORDER — SULFAMETHOXAZOLE-TRIMETHOPRIM 800-160 MG PO TABS: 1.0000 | ORAL_TABLET | Freq: Two times a day (BID) | ORAL | 0 refills | Status: AC

## 2021-05-22 NOTE — Telephone Encounter (Signed)
Patient contacted and advised of antibiotic. Patient to schedule another apt for evaluation if symptoms do not improve.

## 2021-05-25 ENCOUNTER — Telehealth: Payer: Self-pay

## 2021-05-25 NOTE — Telephone Encounter (Signed)
Called patient back for her question regarding RPR. 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 I reassured her that this is a non-specific marker (her RPR has been reactive for past 12 years) and that her T. Pallidum antibodies are non-reactive, confirming that she does not have syphilis. All questions were answered to patient's satisfaction.

## 2021-05-25 NOTE — Telephone Encounter (Signed)
Patient calls nurse line requesting to speak with provider regarding recent test results. Patient reports that she was able to see results via mychart late last night and has further questions.   Please return call to patient at [phone].  Veronda Prude, RN

## 2021-06-04 ENCOUNTER — Telehealth: Payer: Self-pay | Admitting: *Deleted

## 2021-06-04 NOTE — Patient Outreach (Signed)
Care Coordination  06/04/2021  Angelica Brown May 07, 1975 [id number]  RNCM had successful outreach with Ms. Brown today. RNCM provided patient with information re: Case Management/Care Coordination provided as a benefit to her insurance. Ms. Fredric Mare is interested and agreed to schedule an appointment. An appointment was arranged for 06/12/21 @ 2:30pm.  Estanislado Emms RN, BSN [place]   [facility] RN Care Coordinator

## 2021-06-12 ENCOUNTER — Other Ambulatory Visit: Payer: Self-pay

## 2021-07-01 ENCOUNTER — Other Ambulatory Visit: Payer: Self-pay | Admitting: *Deleted

## 2021-07-01 NOTE — Patient Instructions (Signed)
Visit Information ? ?[name]  - as a part of your Medicaid benefit, you are eligible for care management and care coordination services at no cost or copay. I was unable to reach you by phone today but would be happy to help you with your health related needs. Please feel free to call me @ [phone].  ? ?A member of the Managed Medicaid care management team will reach out to you again over the next 14 days.  ? ?Estanislado Emms RN, BSN ?[place]  [facility] ?RN Care Coordinator ?  ?

## 2021-07-01 NOTE — Patient Outreach (Signed)
Care Coordination ? ?07/01/2021 ? ?[name] ?1975-05-19 ?[id number] ? ? ?Medicaid Managed Care  ? ?Unsuccessful Outreach Note ? ?07/01/2021 ?Name: Angelica Brown MRN: [id number] DOB: [birth year] ? ?Referred by: Latrelle Dodrill, MD ?Reason for referral : High Risk Managed Medicaid (Unsuccessful RNCM initial telephone outreach) ? ? ?An unsuccessful telephone outreach was attempted today. The patient was referred to the case management team for assistance with care management and care coordination.  ? ?Follow Up Plan: A HIPAA compliant phone message was left for the patient providing contact information and requesting a return call.  ? ?Estanislado Emms RN, BSN ?[place]  [facility] ?RN Care Coordinator ? ? ?

## 2021-07-09 ENCOUNTER — Other Ambulatory Visit: Payer: Self-pay | Admitting: *Deleted

## 2021-07-09 NOTE — Patient Instructions (Signed)
Visit Information ? ?Ms. Crystol Y Cooper-Worley  - as a part of your Medicaid benefit, you are eligible for care management and care coordination services at no cost or copay. I was unable to reach you by phone today but would be happy to help you with your health related needs. Please feel free to call me @ [phone].  ? ?A member of the Managed Medicaid care management team will reach out to you again over the next 14 days.  ? ?Aastha Dayley RN, BSN ?[place]  [facility] ?RN Care Coordinator ?  ?

## 2021-07-09 NOTE — Patient Outreach (Signed)
Care Coordination ? ?07/09/2021 ? ?Angelica Brown ?10-29-1975 ?[id number] ? ? ?Medicaid Managed Care  ? ?Unsuccessful Outreach Note ? ?07/09/2021 ?Name: [name] MRN: [id number] DOB: Sep 08, 1975 ? ?Referred by: Leeanne Rio, MD ?Reason for referral : High Risk Managed Medicaid (Unsuccessful RNCM initial telephone outreach, 2nd attempt) ? ? ?A second unsuccessful telephone outreach was attempted today. The patient was referred to the case management team for assistance with care management and care coordination.  ? ?Follow Up Plan: A HIPAA compliant phone message was left for the patient providing contact information and requesting a return call.  ? ?Lurena Joiner RN, BSN ?[place] ?RN Care Coordinator ? ? ?

## 2021-07-10 ENCOUNTER — Telehealth: Payer: Self-pay | Admitting: Family Medicine

## 2021-07-10 NOTE — Telephone Encounter (Signed)
.. ?  Medicaid Managed Care  ? ?Unsuccessful Outreach Note ? ?07/10/2021 ?Name: Angelica Brown MRN: [id number] DOB: December 05, 1975 ? ?Referred by: Leeanne Rio, MD ?Reason for referral : High Risk Managed Medicaid (I called the patient today t get her rescheduled with the MM RNCM. I left my name and number on her VM.) ? ? ?An unsuccessful telephone outreach was attempted today. The patient was referred to the case management team for assistance with care management and care coordination.  ? ?Follow Up Plan: The care management team will reach out to the patient again over the next 7 days.  ? ? ?Reita Chard ?Care Guide, High Risk Medicaid Managed Care ?Embedded Care Coordination ?[place]  ? ? ?SIGNATURE  ?

## 2021-07-14 ENCOUNTER — Other Ambulatory Visit: Payer: Self-pay

## 2021-07-14 ENCOUNTER — Other Ambulatory Visit: Payer: Self-pay | Admitting: *Deleted

## 2021-07-14 NOTE — Patient Outreach (Signed)
Care Coordination ? ?07/14/2021 ? ?Angelica Brown ?11/07/1975 ?[id number] ? ?Erroneous encounter ?

## 2021-07-16 ENCOUNTER — Other Ambulatory Visit: Payer: Self-pay

## 2021-07-16 ENCOUNTER — Encounter: Payer: Self-pay | Admitting: Family Medicine

## 2021-07-16 ENCOUNTER — Ambulatory Visit (INDEPENDENT_AMBULATORY_CARE_PROVIDER_SITE_OTHER): Payer: Federal, State, Local not specified - PPO | Admitting: Family Medicine

## 2021-07-16 DIAGNOSIS — L219 Seborrheic dermatitis, unspecified: Secondary | ICD-10-CM | POA: Diagnosis not present

## 2021-07-16 DIAGNOSIS — K219 Gastro-esophageal reflux disease without esophagitis: Secondary | ICD-10-CM

## 2021-07-16 DIAGNOSIS — F17200 Nicotine dependence, unspecified, uncomplicated: Secondary | ICD-10-CM

## 2021-07-16 MED ORDER — OZEMPIC (1 MG/DOSE) 4 MG/3ML ~~LOC~~ SOPN: 1.0000 mg | PEN_INJECTOR | SUBCUTANEOUS | 1 refills | Status: DC

## 2021-07-16 MED ORDER — OMEPRAZOLE 40 MG PO CPDR: 40.0000 mg | DELAYED_RELEASE_CAPSULE | Freq: Every day | ORAL | 0 refills | Status: DC

## 2021-07-16 NOTE — Patient Instructions (Signed)
It was great to see you again today! ? ?Go up to 1mg  per week on the medicine for weight loss ?Work on quitting smoking ?Take prilosec daily as needed  ? ?Follow up with me in 4 weeks, sooner if needed ? ?Be well, ?Dr.  ?

## 2021-07-16 NOTE — Progress Notes (Signed)
?  Date of Visit: 07/16/2021  ? ?SUBJECTIVE:  ? ?HPI: ? ?[facility] presents today for follow up. ? ?Obesity - taking ozempic 0.5mg  weekly. Has lost weight (down about 30lb since last visit here at [facility]) but patient feels she isn't losing weight fast enough and clothes are not fitting better. Endorses continued challenges with eating healthy - eats fried chicken multiple times per week. ? ?Skin issues - previously referred to derm but has not heard anything about this appointment, desires another referral. ? ?GERD - worsened lately, not taking PPI regularly ? ?Smoking - smokes 1/2 ppd since age 14 (77 pack years). Would like to cut back some. ? ? ?OBJECTIVE:  ? ?BP (!) 141/82   Pulse 71   Wt (!) 342 lb 6.4 oz (155.3 kg)   SpO2 99%   BMI 50.56 kg/m?  ?Gen: no acute distress, pleasant, cooperative ?HEENT: normocephalic, atraumatic  ?Lungs: normal work of breathing  ?Neuro: alert, speech normal, grossly nonfocal ? ?ASSESSMENT/PLAN:  ? ?OBESITY, MORBID ?Discussed lifestyle changes, limiting fried foods ?Increase ozempic to 1mg  weekly ?Follow up with me in 4 weeks ? ?TOBACCO DEPENDENCE ?Encouraged cessation. She will work on cutting back. ? ?GERD (gastroesophageal reflux disease) ?Encouraged consistent PPI use to control symptoms  ? ?Seborrheic dermatitis ?Did not discuss today in detail, placing new derm referral ? ?FOLLOW UP: ?Follow up in 1 mo for obesity & tobacco abuse ? ?[place]. Ardelia Mems, MD ?[facility] ?

## 2021-07-19 DIAGNOSIS — K219 Gastro-esophageal reflux disease without esophagitis: Secondary | ICD-10-CM | POA: Insufficient documentation

## 2021-07-19 NOTE — Assessment & Plan Note (Addendum)
Encouraged consistent PPI use to control symptoms  ?

## 2021-07-19 NOTE — Assessment & Plan Note (Signed)
Encouraged cessation. She will work on cutting back. ?

## 2021-07-19 NOTE — Assessment & Plan Note (Signed)
Did not discuss today in detail, placing new derm referral ?

## 2021-07-19 NOTE — Assessment & Plan Note (Signed)
Discussed lifestyle changes, limiting fried foods ?Increase ozempic to 1mg  weekly ?Follow up with me in 4 weeks ?

## 2021-09-11 ENCOUNTER — Telehealth: Payer: Self-pay | Admitting: Family Medicine

## 2021-09-11 MED ORDER — OZEMPIC (1 MG/DOSE) 4 MG/3ML ~~LOC~~ SOPN: 1.0000 mg | PEN_INJECTOR | SUBCUTANEOUS | 1 refills | Status: DC

## 2021-09-11 NOTE — Telephone Encounter (Signed)
Patient requesting refill on her Ozempic. She has ran out and does not have enough to last her until her follow up appointment on June 19th. Pharmacy is [facility] in [place]. Please advise.

## 2021-09-11 NOTE — Telephone Encounter (Signed)
Rx sent Deyanira Fesler J Marisabel Macpherson, MD  

## 2021-09-22 ENCOUNTER — Encounter: Payer: Self-pay | Admitting: *Deleted

## 2021-10-04 ENCOUNTER — Other Ambulatory Visit: Payer: Self-pay | Admitting: Family Medicine

## 2021-10-05 ENCOUNTER — Ambulatory Visit: Payer: Federal, State, Local not specified - PPO | Admitting: Family Medicine

## 2021-10-06 NOTE — Progress Notes (Unsigned)
  Date of Visit: 10/07/2021   SUBJECTIVE:   HPI:  [facility] presents today for follow up of weight, also to discuss mood.  Obesity - currently taking ozempic 1mg  weekly. Has lost 27 lbs since last visit, 57lb down total since February of this year. She is frustrated, feels she should have lost more weight than this.  Mood - has felt very down and depressed recently. Cried daily for the last 2 months. Found out at beginning of March that her husband has been using cocaine. She was not aware previously that he had a substance use issue. They have been married for 3 years. He has told her he wants a divorce. She denies SI/HI but admits to very low mood.  OSA - uses PAP nightly, compliant.  OBJECTIVE:   BP (!) 137/96   Pulse 68   Ht 5' 8.5" (1.74 m)   Wt (!) 315 lb 6.4 oz (143.1 kg)   SpO2 96%   BMI 47.26 kg/m  Gen: no acute physical distress. tearful HEENT: normocephalic, atraumatic  Lungs: normal work of breathing  Neuro: alert, speech normal, grossly nonfocal Psych: affect primarily sad/tearful, well groomed, speech normal in rate and volume, normal eye contact. Denies SI/HI  ASSESSMENT/PLAN:   OSA (obstructive sleep apnea) Compliant with PAP at night  Stress Significant stressors lately with husband requesting divorce as well as his substance issues. No concern for underlying bipolar spectrum illness, and previously tolerated sertraline years ago. Begin sertraline 50mg  daily. Discussed typical timeline of symptom improvement with initiating SSRI - 4-6wks for peak efficacy. Given info on how to contact a therapist to schedule. Follow up with me on 7/10 as scheduled to evaluate for safety and tolerability.   OBESITY, MORBID Excellent weight loss on ozempic. Continue 1mg  weekly. Recheck weight at follow up on 7/10  FOLLOW UP: Follow up on 7/10 for mood and weight.   J. 9/10, MD [facility]

## 2021-10-07 ENCOUNTER — Ambulatory Visit (INDEPENDENT_AMBULATORY_CARE_PROVIDER_SITE_OTHER): Payer: Federal, State, Local not specified - PPO | Admitting: Family Medicine

## 2021-10-07 ENCOUNTER — Encounter: Payer: Self-pay | Admitting: Family Medicine

## 2021-10-07 VITALS — BP 137/96 | HR 68 | Ht 68.5 in | Wt 315.4 lb

## 2021-10-07 DIAGNOSIS — G4733 Obstructive sleep apnea (adult) (pediatric): Secondary | ICD-10-CM | POA: Diagnosis not present

## 2021-10-07 DIAGNOSIS — F439 Reaction to severe stress, unspecified: Secondary | ICD-10-CM | POA: Diagnosis not present

## 2021-10-07 MED ORDER — SERTRALINE HCL 50 MG PO TABS: 50.0000 mg | ORAL_TABLET | Freq: Every day | ORAL | 0 refills | Status: DC

## 2021-10-07 NOTE — Assessment & Plan Note (Signed)
Excellent weight loss on ozempic. Continue 1mg  weekly. Recheck weight at follow up on 7/10

## 2021-10-07 NOTE — Assessment & Plan Note (Signed)
Compliant with PAP at night

## 2021-10-07 NOTE — Patient Instructions (Signed)
Stay on current dose of ozempic.  Start sertraline 50mg  daily. Follow up with me on 7/10 as scheduled. Call if any worsening of mood.  You can call [phone] for immediate help if you have thoughts of hurting yourself.  For information on therapists, please go to [email or web address]. You can also contact your insurance company to find an in-network therapist.   Be well, [email or web address]

## 2021-10-07 NOTE — Assessment & Plan Note (Signed)
Significant stressors lately with husband requesting divorce as well as his substance issues. No concern for underlying bipolar spectrum illness, and previously tolerated sertraline years ago. Begin sertraline 50mg  daily. Discussed typical timeline of symptom improvement with initiating SSRI - 4-6wks for peak efficacy. Given info on how to contact a therapist to schedule. Follow up with me on 7/10 as scheduled to evaluate for safety and tolerability.

## 2021-10-26 ENCOUNTER — Other Ambulatory Visit: Payer: Self-pay

## 2021-10-26 ENCOUNTER — Ambulatory Visit (HOSPITAL_COMMUNITY)
Admission: RE | Admit: 2021-10-26 | Disposition: A | Discharge status: Home / Self Care | Payer: Federal, State, Local not specified - PPO | Source: Ambulatory Visit

## 2021-10-26 ENCOUNTER — Ambulatory Visit (INDEPENDENT_AMBULATORY_CARE_PROVIDER_SITE_OTHER): Payer: Federal, State, Local not specified - PPO | Admitting: Family Medicine

## 2021-10-26 ENCOUNTER — Encounter: Payer: Self-pay | Admitting: Family Medicine

## 2021-10-26 DIAGNOSIS — F439 Reaction to severe stress, unspecified: Secondary | ICD-10-CM

## 2021-10-26 DIAGNOSIS — R002 Palpitations: Secondary | ICD-10-CM

## 2021-10-26 MED ORDER — NITROGLYCERIN 0.4 MG SL SUBL: 0.4000 mg | SUBLINGUAL_TABLET | SUBLINGUAL | 12 refills | Status: AC | PRN

## 2021-10-26 MED ORDER — DULOXETINE HCL 30 MG PO CPEP: 30.0000 mg | ORAL_CAPSULE | Freq: Every day | ORAL | 0 refills | Status: DC

## 2021-10-26 NOTE — Progress Notes (Signed)
  Date of Visit: 10/26/2021   SUBJECTIVE:   HPI:  Angelica Brown presents today for follow up of mood and weight.  Mood - last visit started sertraline 50mg  daily. Has taken this for several weeks and feels it is not helping, in fact is making her feel worse. Feels very anxious and stressed. No SI/HI. Husband has cut her off entirely, not speaking to him.  Obesity - taking ozempic 1mg  weekly. She is down another 15 lb since last visit on 6/21. Eating very little, in part she thinks due to stress. Has noted occasional palpitations.   OBJECTIVE:   BP 129/88   Pulse 92   Wt 299 lb 12.8 oz (136 kg)   SpO2 98%   BMI 44.92 kg/m  Gen: no acute distress, cooperative HEENT: normocephalic, atraumatic  Heart: slightly irreg rhythm, ? Sinus arrythmia, no murmurs Lungs: clear to auscultation bilaterally, normal work of breathing  Neuro: alert, speech normal Ext: No appreciable lower extremity edema bilaterally Psych: affect full range, primarily sad, no SI/HI  ASSESSMENT/PLAN:   OBESITY, MORBID Continue ozempic 1mg  weekly Will not increase dose at present given continuing of rapid weight loss, likely hastened by stress. Would like patient to lose weight slightly less rapidly than she is presently. She does not want to back off the dose of ozempic. Check labs today to monitor nutritional status - CMET, mag, phos Follow up in several weeks for mood and weight.  Stress Not improved, not tolerating sertraline Stop zoloft. Start cymbalta. Had considered wellbutrin but given rapid weight loss and poor PO intake, hesitate to start medication that lowers seizure threshold. Follow up in sevearl weeks to assess.  EKG done today due to report of occasional palpitations and ?irreg rhythm on exam. Shows NSR, further interpretation not possible due to artifact. Checking electrolytes. Continue to monitor.  FOLLOW UP: Follow up in late July for mood and weight  7/21 J. , MD [facility]

## 2021-10-26 NOTE — Patient Instructions (Signed)
It was great to see you again today.  Stop zoloft Start cymbalta 30mg  daily. Refilled nitroglycerin Checking labs today  Follow up with me on 7/27 at 9:50am  Call with any questions or concerns  Be well, Dr. 8/27

## 2021-10-27 ENCOUNTER — Other Ambulatory Visit: Payer: Self-pay | Admitting: Family Medicine

## 2021-10-27 LAB — CMP14+EGFR
ALT: 11 IU/L (ref 0–32)
AST: 14 IU/L (ref 0–40)
Albumin/Globulin Ratio: 1.2 (ref 1.2–2.2)
Albumin: 4.4 g/dL (ref 3.9–4.9)
Alkaline Phosphatase: 78 IU/L (ref 44–121)
BUN/Creatinine Ratio: 11 (ref 9–23)
BUN: 9 mg/dL (ref 6–24)
Bilirubin Total: 0.7 mg/dL (ref 0.0–1.2)
CO2: 24 mmol/L (ref 20–29)
Calcium: 10.2 mg/dL (ref 8.7–10.2)
Chloride: 100 mmol/L (ref 96–106)
Creatinine, Ser: 0.81 mg/dL (ref 0.57–1.00)
Globulin, Total: 3.6 g/dL (ref 1.5–4.5)
Glucose: 84 mg/dL (ref 70–99)
Potassium: 4 mmol/L (ref 3.5–5.2)
Sodium: 139 mmol/L (ref 134–144)
Total Protein: 8 g/dL (ref 6.0–8.5)
eGFR: 92 mL/min/{1.73_m2} (ref >59)

## 2021-10-27 LAB — LIPID PANEL
Chol/HDL Ratio: 3.6 ratio (ref 0.0–4.4)
Cholesterol, Total: 141 mg/dL (ref 100–199)
HDL: 39 mg/dL — ABNORMAL LOW (ref >39)
LDL Chol Calc (NIH): 85 mg/dL (ref 0–99)
Triglycerides: 87 mg/dL (ref 0–149)
VLDL Cholesterol Cal: 17 mg/dL (ref 5–40)

## 2021-10-27 LAB — PHOSPHORUS: Phosphorus: 4 mg/dL (ref 3.0–4.3)

## 2021-10-27 LAB — TSH RFX ON ABNORMAL TO FREE T4: TSH: 1.15 u[IU]/mL (ref 0.450–4.500)

## 2021-10-27 LAB — MAGNESIUM: Magnesium: 2.2 mg/dL (ref 1.6–2.3)

## 2021-10-29 ENCOUNTER — Other Ambulatory Visit: Payer: Self-pay | Admitting: Family Medicine

## 2021-11-01 NOTE — Assessment & Plan Note (Signed)
Not improved, not tolerating sertraline Stop zoloft. Start cymbalta. Had considered wellbutrin but given rapid weight loss and poor PO intake, hesitate to start medication that lowers seizure threshold. Follow up in sevearl weeks to assess.

## 2021-11-01 NOTE — Assessment & Plan Note (Signed)
Continue ozempic 1mg  weekly Will not increase dose at present given continuing of rapid weight loss, likely hastened by stress. Would like patient to lose weight slightly less rapidly than she is presently. She does not want to back off the dose of ozempic. Check labs today to monitor nutritional status - CMET, mag, phos Follow up in several weeks for mood and weight.

## 2021-11-09 ENCOUNTER — Other Ambulatory Visit: Payer: Self-pay

## 2021-11-09 DIAGNOSIS — H02821 Cysts of right upper eyelid: Secondary | ICD-10-CM | POA: Diagnosis not present

## 2021-11-09 DIAGNOSIS — D23111 Other benign neoplasm of skin of right upper eyelid, including canthus: Secondary | ICD-10-CM | POA: Diagnosis not present

## 2021-11-09 DIAGNOSIS — H02824 Cysts of left upper eyelid: Secondary | ICD-10-CM | POA: Diagnosis not present

## 2021-11-12 ENCOUNTER — Ambulatory Visit: Payer: Federal, State, Local not specified - PPO | Admitting: Family Medicine

## 2021-12-14 ENCOUNTER — Ambulatory Visit (INDEPENDENT_AMBULATORY_CARE_PROVIDER_SITE_OTHER): Payer: Federal, State, Local not specified - PPO | Admitting: Family Medicine

## 2021-12-14 ENCOUNTER — Encounter: Payer: Self-pay | Admitting: Family Medicine

## 2021-12-14 DIAGNOSIS — F17200 Nicotine dependence, unspecified, uncomplicated: Secondary | ICD-10-CM | POA: Diagnosis not present

## 2021-12-14 DIAGNOSIS — F439 Reaction to severe stress, unspecified: Secondary | ICD-10-CM

## 2021-12-14 MED ORDER — OZEMPIC (2 MG/DOSE) 8 MG/3ML ~~LOC~~ SOPN: 2.0000 mg | PEN_INJECTOR | SUBCUTANEOUS | 2 refills | Status: DC

## 2021-12-14 NOTE — Assessment & Plan Note (Signed)
Encouraged smoking cessation.  Given low number of cigarettes per day, recommend use of nicotine gum, which she will get over-the-counter.

## 2021-12-14 NOTE — Assessment & Plan Note (Signed)
Weight loss has plateaued.  Increase Ozempic to 2 mg once per week.  Follow-up in 2 months.  Continue other lifestyle measures, which she has done very well.

## 2021-12-14 NOTE — Progress Notes (Signed)
  Date of Visit: 12/14/2021   SUBJECTIVE:   HPI:  Angelica Brown presents today for follow-up of weight and mood.  Obesity: Taking Ozempic 1 mg once per week.  Has noticed weight loss has plateaued.  She has been working very hard to eat healthy, and is exercising 1 hour/day.  Joined [employer] and has been doing classes regularly.  Has been running bleachers, also working on running around the track. Weight today 297 pounds.  Mood: Never did start the Cymbalta.  Mood is overall doing much better at present.  Denies thoughts of harming herself or others.  Her ex has been diagnosed with stage II gastric cancer, and she has been traveling to [place] to support him in his treatments, but is maintaining boundaries in the relationship.  He wants to reconcile and she has declined.  She is feeling a lot better.  Tobacco use: Smoking 3 to 5 cigarettes/day.  Desires to back off even further.  OBJECTIVE:   BP 122/70   Pulse 64   Ht 5\' 9"  (1.753 m)   Wt 297 lb (134.7 kg)   SpO2 95%   BMI 43.86 kg/m  Gen: no acute distress, pleasant, cooperative HEENT: normocephalic, atraumatic  Heart: regular rate and rhythm, no murmur Lungs: clear to auscultation bilaterally, normal work of breathing  Neuro: alert, speech normal Psych: normal range of affect, well groomed, speech normal in rate and volume, normal eye contact   ASSESSMENT/PLAN:   Health maintenance:  -return for flu shot when available  OBESITY, MORBID Weight loss has plateaued.  Increase Ozempic to 2 mg once per week.  Follow-up in 2 months.  Continue other lifestyle measures, which she has done very well.  Stress Doing much better, not requiring any medications at present.  Congratulated her on all she is doing to engage in self-care.  Recheck mood in 2 months.  TOBACCO DEPENDENCE Encouraged smoking cessation.  Given low number of cigarettes per day, recommend use of nicotine gum, which she will get over-the-counter.  FOLLOW  UP: Follow up in 2 months for above issues  J. [place], MD [facility]

## 2021-12-14 NOTE — Patient Instructions (Signed)
It was great to see you again today!  Go up to 2mg  of ozempic once a week. Follow up in 2 months   Try nicotine gum  Be well, Dr. 

## 2021-12-14 NOTE — Assessment & Plan Note (Signed)
Doing much better, not requiring any medications at present.  Congratulated her on all she is doing to engage in self-care.  Recheck mood in 2 months.

## 2021-12-30 ENCOUNTER — Other Ambulatory Visit: Payer: Self-pay | Admitting: Family Medicine

## 2022-01-12 ENCOUNTER — Other Ambulatory Visit: Payer: Self-pay | Admitting: Family Medicine

## 2022-01-12 MED ORDER — OZEMPIC (2 MG/DOSE) 8 MG/3ML ~~LOC~~ SOPN: 2.0000 mg | PEN_INJECTOR | SUBCUTANEOUS | 2 refills | Status: DC

## 2022-01-24 ENCOUNTER — Encounter (HOSPITAL_COMMUNITY): Payer: Self-pay

## 2022-01-24 ENCOUNTER — Emergency Department (HOSPITAL_COMMUNITY)
Admission: EM | Admit: 2022-01-24 | Discharge: 2022-01-24 | Disposition: A | Discharge status: Home / Self Care | Payer: Federal, State, Local not specified - PPO | Attending: Emergency Medicine | Admitting: Emergency Medicine

## 2022-01-24 ENCOUNTER — Other Ambulatory Visit: Payer: Self-pay

## 2022-01-24 DIAGNOSIS — R0602 Shortness of breath: Secondary | ICD-10-CM | POA: Diagnosis not present

## 2022-01-24 DIAGNOSIS — U071 COVID-19: Secondary | ICD-10-CM | POA: Insufficient documentation

## 2022-01-24 DIAGNOSIS — Z7901 Long term (current) use of anticoagulants: Secondary | ICD-10-CM | POA: Insufficient documentation

## 2022-01-24 DIAGNOSIS — Z79899 Other long term (current) drug therapy: Secondary | ICD-10-CM | POA: Diagnosis not present

## 2022-01-24 LAB — SARS CORONAVIRUS 2 BY RT PCR: SARS Coronavirus 2 by RT PCR: POSITIVE — AB

## 2022-01-24 MED ORDER — MOLNUPIRAVIR EUA 200MG CAPSULE
4.0000 | ORAL_CAPSULE | Freq: Two times a day (BID) | ORAL | Status: DC
  Administered 2022-01-24: 800 mg via ORAL
  Filled 2022-01-24: qty 4

## 2022-01-24 MED ORDER — ONDANSETRON HCL 4 MG PO TABS: 4.0000 mg | ORAL_TABLET | Freq: Four times a day (QID) | ORAL | 0 refills | Status: DC

## 2022-01-24 NOTE — ED Triage Notes (Signed)
Pt report positive covid today. States she does not feel well.

## 2022-01-24 NOTE — ED Provider Notes (Signed)
  [facility] EMERGENCY DEPARTMENT Provider Note   CSN: [id number] Arrival date & time: 01/24/22  2159     History {Add pertinent medical, surgical, social history, OB history to HPI:1} No chief complaint on file.   Verina Galeno Cooper-Worley is a 46 y.o. female.  HPI     Home Medications Prior to Admission medications   Medication Sig Start Date End Date Taking? Authorizing Provider  dabigatran (PRADAXA) 150 MG CAPS capsule Take 1 capsule (150 mg total) by mouth every 12 (twelve) hours. 01/13/21   Little Ishikawa, MD  metoprolol tartrate (LOPRESSOR) 25 MG tablet TAKE 1/2 (ONE-HALF) TABLET BY MOUTH ONCE DAILY WITH BREAKFAST Patient taking differently: Take 12.5 mg by mouth 2 (two) times daily. 01/18/20   Leeanne Rio, MD  nitroGLYCERIN (NITROSTAT) 0.4 MG SL tablet Place 1 tablet (0.4 mg total) under the tongue every 5 (five) minutes x 3 doses as needed for chest pain. 10/26/21   Leeanne Rio, MD  omeprazole (PRILOSEC) 40 MG capsule Take 1 capsule (40 mg total) by mouth daily as needed. 01/01/22   Leeanne Rio, MD  prasugrel (EFFIENT) 10 MG TABS tablet Take 1 tablet (10 mg total) by mouth daily. 11/28/20   Leeanne Rio, MD  rosuvastatin (CRESTOR) 40 MG tablet Take 1 tablet (40 mg total) by mouth daily. 11/24/20   Leeanne Rio, MD  Semaglutide, 2 MG/DOSE, (OZEMPIC, 2 MG/DOSE,) 8 MG/3ML SOPN Inject 2 mg into the skin once a week. 01/12/22   Leeanne Rio, MD  topiramate (TOPAMAX) 50 MG tablet Take 1 tablet (50 mg total) by mouth daily. 11/24/20   Leeanne Rio, MD  XARELTO 20 MG TABS tablet Take 1 tablet by mouth once daily 07/09/20 07/11/20  Nicholas Lose, MD      Allergies    Ampicillin, Penicillins, Strawberry extract, Cephalexin, and Prednisone    Review of Systems   Review of Systems  Physical Exam Updated Vital Signs There were no vitals taken for this visit. Physical Exam  ED Results / Procedures / Treatments   Labs (all labs  ordered are listed, but only abnormal results are displayed) Labs Reviewed - No data to display  EKG None  Radiology No results found.  Procedures Procedures  {Document cardiac monitor, telemetry assessment procedure when appropriate:1}  Medications Ordered in ED Medications - No data to display  ED Course/ Medical Decision Making/ A&P                           Medical Decision Making  ***  {Document critical care time when appropriate:1} {Document review of labs and clinical decision tools ie heart score, Chads2Vasc2 etc:1}  {Document your independent review of radiology images, and any outside records:1} {Document your discussion with family members, caretakers, and with consultants:1} {Document social determinants of health affecting pt's care:1} {Document your decision making why or why not admission, treatments were needed:1} Final Clinical Impression(s) / ED Diagnoses Final diagnoses:  None    Rx / DC Orders ED Discharge Orders     None

## 2022-01-24 NOTE — Discharge Instructions (Addendum)
Isolate for least 5 days per CDC guidelines.  Please take Tylenol for your symptoms.  If you develop severe shortness of breath, your oxygen dips below 90%, or you have chest pain please return to the ER.

## 2022-01-25 ENCOUNTER — Telehealth: Payer: Self-pay

## 2022-01-25 NOTE — Patient Outreach (Signed)
  Care Coordination TOC Note Transition Care Management Unsuccessful Follow-up Telephone Call  Date of discharge and from where:  [facility] 01/24/22  Attempts:  1st Attempt  Reason for unsuccessful TCM follow-up call:  Unable to leave message

## 2022-01-26 ENCOUNTER — Telehealth: Payer: Self-pay

## 2022-01-26 NOTE — Patient Outreach (Signed)
  Care Coordination TOC Note Transition Care Management Follow-up Telephone Call Date of discharge and from where: [facility] 01/24/22 How have you been since you were released from the hospital? "I still feel crappy, am controlling my temperature". Any questions or concerns? No  Items Reviewed: Did the pt receive and understand the discharge instructions provided? Yes  Medications obtained and verified? Yes  Other? No  Any new allergies since your discharge? No  Dietary orders reviewed? Yes Do you have support at home? Yes   Home Care and Equipment/Supplies: Were home health services ordered? not applicable If so, what is the name of the agency? N/A  Has the agency set up a time to come to the patient's home? not applicable Were any new equipment or medical supplies ordered?  No What is the name of the medical supply agency? N/A Were you able to get the supplies/equipment? not applicable Do you have any questions related to the use of the equipment or supplies? No  Functional Questionnaire: (I = Independent and D = Dependent) ADLs: I  Bathing/Dressing- I  Meal Prep- I  Eating- I  Maintaining continence- I  Transferring/Ambulation- I  Managing Meds- I  Follow up appointments reviewed:  PCP Hospital f/u appt confirmed? No   Specialist Hospital f/u appt confirmed? No   Are transportation arrangements needed? No  If their condition worsens, is the pt aware to call PCP or go to the Emergency Dept.? Yes Was the patient provided with contact information for the PCP's office or ED? Yes Was to pt encouraged to call back with questions or concerns? Yes  SDOH assessments and interventions completed:   Yes  Care Coordination Interventions Activated:  Yes   Care Coordination Interventions:  No Care Coordination interventions needed at this time.   Encounter Outcome:  Pt. Visit Completed

## 2022-01-28 ENCOUNTER — Telehealth: Payer: Self-pay

## 2022-01-28 NOTE — Telephone Encounter (Signed)
Patient calls nurse line regarding testing positive for COVID.   She tested positive on 10/8 with symptom onset on Monday, 10/2. She was started on Molnupiravir on 10/8. She currently reports no smell or taste, pressure in head, nasal congestion, slight SHOB and intermittent fever and chills. She states that she is not having trouble breathing at this time. She also reports productive cough with thick green mucus.  She has been taking OTC nyquil, mucinex and tylenol cold and flu for symptom management.   She received last COVID vaccine in 2022.   Patient has scheduled an appointment for follow up tomorrow. As she is still having symptoms, she is needing letter for work.   Red flags discussed.   Talbot Grumbling, RN

## 2022-01-28 NOTE — Progress Notes (Deleted)
  SUBJECTIVE:   CHIEF COMPLAINT / HPI:   COVID-positive: Presents after positive COVID test on 10/8 with symptom onset on 10/2.  She has been taking molnupiravir since 10/8.  She has been using OTC NyQuil, Mucinex, Tylenol for symptom management.  PERTINENT  PMH / PSH: MI, morbid obesity, tobacco use, CHF, antiphospholipid antibody syndrome, HTN, GERD  OBJECTIVE:  There were no vitals taken for this visit. Physical Exam   ASSESSMENT/PLAN:  There are no diagnoses linked to this encounter. No follow-ups on file. Wells Guiles, DO 01/28/2022, 2:39 PM PGY-***, [facility] {    This will disappear when note is signed, click to select method of visit    :1}

## 2022-01-29 ENCOUNTER — Ambulatory Visit: Payer: Federal, State, Local not specified - PPO

## 2022-02-02 ENCOUNTER — Ambulatory Visit (INDEPENDENT_AMBULATORY_CARE_PROVIDER_SITE_OTHER): Payer: Federal, State, Local not specified - PPO | Admitting: Student

## 2022-02-02 VITALS — BP 102/66 | HR 60 | Wt 300.0 lb

## 2022-02-02 DIAGNOSIS — R058 Other specified cough: Secondary | ICD-10-CM | POA: Diagnosis not present

## 2022-02-02 NOTE — Assessment & Plan Note (Signed)
Within expected illness course as she recovers from [place]. Reassuringly afebrile and with vitals WNL. Non-focal pulmonary exam reassuring against pneumonia. Return precautions reviewed for new fever at this point which would be concerning for a secondary PNA. Advised to avoid combining OTC flu/cold combinations that contain Tylenol as she had been taking Tylenol Flu/Cold and Nyquil together.

## 2022-02-02 NOTE — Patient Instructions (Signed)
[name],  I am so sorry that your COVID is taking longer to get over but we would like.  Thankfully, I do not think that you are developing a pneumonia or anything scary right now.  Your cough and fatigue should continue to get better with time.  Be careful with the Tylenol-containing medications.  I do not want you to have too much Tylenol.  You may use honey for cough.  If at this point you start to develop new fevers, this would be a good reason to come back and see [place].  I would not expect your COVID to cause any more fevers at this point.  Pearla Dubonnet, MD

## 2022-02-02 NOTE — Progress Notes (Signed)
    SUBJECTIVE:   CHIEF COMPLAINT / HPI:   COVID-42 Follow-up Was seen in the ED on 10/8 and diagnosed with COVID-19. Was given a course of molnupiravir which she tolerated without issue. Initially presented with SOB and fatigue, her SOB and other symptoms have more or less resolved but she is here today due to persistent cough and fatigue. Has been taking Tylenol flu/cold and Nyquil. Cough is intermittent and minimally productive. She has been afebrile for about a week now. Breathing has normalized and O2 sats on RA are 96% today.  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 Reassuringly afebrile and with vitals WNL. Non-focal pulmonary exam reassuring against pneumonia. Return precautions reviewed for new fever at this point which would be concerning for a secondary PNA. Advised to avoid combining OTC flu/cold combinations that contain Tylenol as she had been taking Tylenol Flu/Cold and Nyquil together.      Pearla Dubonnet, MD [facility]

## 2022-03-21 NOTE — Progress Notes (Unsigned)
SUBJECTIVE:   CHIEF COMPLAINT / HPI:   Ozempic check up -A1c 5.2 today -Has cut out fried foods and sodas. Is more conscious about the foods she eats -Walks 30-45 mins per day, likes to use the treadmill  -[employer] on [employer] as well -Initially felt pretty gassy on Ozempic, hasn't been a big problem recently -has been out of ozempic for about 2 weeks, was recently increased to 2mg  in August  PERTINENT  PMH / PSH:   Past Medical History:  Diagnosis Date   ACS (acute coronary syndrome) (HCC) 05/04/2018   CHOLELITHIASIS    Chronic systolic CHF (congestive heart failure) (HCC) 06/13/2016   Ischemic CM (inf MI 05/2018) >> Echo 05/04/2018 - Mod LVH, EF 40-45, inf-lat HK, PASP 47 (mild pul HTN)   Coronary artery disease    Dermatophytosis of nail    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    acute dural venous sinus thrombosis and right mastoid effusion/notes 05/28/2016   GERD (gastroesophageal reflux disease)    Gestational diabetes    "w/all 4 pregnancies"   Heart murmur    Hypertension    Metrorrhagia    Migraine    "at least twice/month" (05/28/2016)   Nystagmus 06/30/2016   OBESITY, MORBID    POSTURAL LIGHTHEADEDNESS    Sickle cell trait (HCC)    STEMI (ST elevation myocardial infarction) (HCC) 11/09/2017   in 11/11/2017, [place] - see records in Care Everywhere   SYNCOPE    Throat pain    TOBACCO DEPENDENCE     OBJECTIVE:  BP 124/86   Pulse (!) 56   Wt 278 lb 8 oz (126.3 kg)   SpO2 98%   BMI 41.13 kg/m   General: NAD, pleasant, able to participate in exam Cardiac: RRR, no murmurs auscultated Respiratory: CTAB, normal WOB Abdomen: soft, non-tender, non-distended, normoactive bowel sounds Extremities: warm and well perfused, no edema or cyanosis Skin: warm and dry, no rashes noted Neuro: alert, no obvious focal deficits, speech normal Psych: Normal affect and mood  ASSESSMENT/PLAN:   Morbid obesity (HCC) Assessment & Plan: Has been out of Ozempic  for about 2 weeks but has still been able to lose weight since her last visit.  Has been making excellent lifestyle changes to support this.  Her Ozempic dose was previously increased to 2 mg due to a plateau in weight loss.  However, given her recent improvement, feel that she can continue taking 1 mg and continue her other lifestyle changes.  A1c 5.2 today, improved from 5.7 at last visit.  Follow-up in 3 months.  Orders: -     POCT glycosylated hemoglobin (Hb A1C) -     Ozempic (1 MG/DOSE); Inject 1 mg into the skin once a week.  Dispense: 3 mL; Refill: 1  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HCC) -     Dabigatran Etexilate Mesylate; Take 1 capsule (150 mg total) by mouth every 12 (twelve) hours.  Dispense: 60 capsule; Refill: 1  History of MI (myocardial infarction) -     Prasugrel HCl; Take 1 tablet (10 mg total) by mouth daily.  Dispense: 90 tablet; Refill: 0 -     Rosuvastatin Calcium; Take 1 tablet (40 mg total) by mouth daily.  Dispense: 90 tablet; Refill: 0 -     Metoprolol Tartrate; Take 0.5 tablets (12.5 mg total) by mouth 2 (two) times daily.  Dispense: 30 tablet; Refill: 3  Mastoiditis of right side -     Topiramate; Take 1 tablet (50 mg total) by mouth  daily.  Dispense: 90 tablet; Refill: 0   Meds ordered this encounter  Medications   prasugrel (EFFIENT) 10 MG TABS tablet    Sig: Take 1 tablet (10 mg total) by mouth daily.    Dispense:  90 tablet    Refill:  0   rosuvastatin (CRESTOR) 40 MG tablet    Sig: Take 1 tablet (40 mg total) by mouth daily.    Dispense:  90 tablet    Refill:  0   topiramate (TOPAMAX) 50 MG tablet    Sig: Take 1 tablet (50 mg total) by mouth daily.    Dispense:  90 tablet    Refill:  0   metoprolol tartrate (LOPRESSOR) 25 MG tablet    Sig: Take 0.5 tablets (12.5 mg total) by mouth 2 (two) times daily.    Dispense:  30 tablet    Refill:  3   dabigatran (PRADAXA) 150 MG CAPS capsule    Sig: Take 1 capsule (150 mg total) by mouth every 12 (twelve)  hours.    Dispense:  60 capsule    Refill:  1   Semaglutide, 1 MG/DOSE, (OZEMPIC, 1 MG/DOSE,) 4 MG/3ML SOPN    Sig: Inject 1 mg into the skin once a week.    Dispense:  3 mL    Refill:  1   Return in about 3 months (around 06/21/2022) for ozempic check up.  Vonna Drafts, MD [facility]

## 2022-03-22 ENCOUNTER — Ambulatory Visit (INDEPENDENT_AMBULATORY_CARE_PROVIDER_SITE_OTHER): Payer: Federal, State, Local not specified - PPO | Admitting: Family Medicine

## 2022-03-22 ENCOUNTER — Encounter: Payer: Self-pay | Admitting: Family Medicine

## 2022-03-22 DIAGNOSIS — I252 Old myocardial infarction: Secondary | ICD-10-CM

## 2022-03-22 DIAGNOSIS — I2699 Other pulmonary embolism without acute cor pulmonale: Secondary | ICD-10-CM | POA: Diagnosis not present

## 2022-03-22 DIAGNOSIS — Z23 Encounter for immunization: Secondary | ICD-10-CM | POA: Diagnosis not present

## 2022-03-22 DIAGNOSIS — H7091 Unspecified mastoiditis, right ear: Secondary | ICD-10-CM | POA: Diagnosis not present

## 2022-03-22 LAB — POCT GLYCOSYLATED HEMOGLOBIN (HGB A1C): Hemoglobin A1C: 5.2 % (ref 4.0–5.6)

## 2022-03-22 MED ORDER — METOPROLOL TARTRATE 25 MG PO TABS: 12.5000 mg | ORAL_TABLET | Freq: Two times a day (BID) | ORAL | 3 refills | Status: DC

## 2022-03-22 MED ORDER — DABIGATRAN ETEXILATE MESYLATE 150 MG PO CAPS: 150.0000 mg | ORAL_CAPSULE | Freq: Two times a day (BID) | ORAL | 1 refills | Status: DC

## 2022-03-22 MED ORDER — OZEMPIC (1 MG/DOSE) 4 MG/3ML ~~LOC~~ SOPN: 1.0000 mg | PEN_INJECTOR | SUBCUTANEOUS | 1 refills | Status: DC

## 2022-03-22 MED ORDER — PRASUGREL HCL 10 MG PO TABS: 10.0000 mg | ORAL_TABLET | Freq: Every day | ORAL | 0 refills | Status: DC

## 2022-03-22 MED ORDER — ROSUVASTATIN CALCIUM 40 MG PO TABS: 40.0000 mg | ORAL_TABLET | Freq: Every day | ORAL | 0 refills | Status: DC

## 2022-03-22 MED ORDER — TOPIRAMATE 50 MG PO TABS: 50.0000 mg | ORAL_TABLET | Freq: Every day | ORAL | 0 refills | Status: DC

## 2022-03-22 NOTE — Addendum Note (Signed)
Addended by: Aquilla Solian on: 03/22/2022 11:40 AM   Modules accepted: Orders

## 2022-03-22 NOTE — Assessment & Plan Note (Signed)
Has been out of Ozempic for about 2 weeks but has still been able to lose weight since her last visit.  Has been making excellent lifestyle changes to support this.  Her Ozempic dose was previously increased to 2 mg due to a plateau in weight loss.  However, given her recent improvement, feel that she can continue taking 1 mg and continue her other lifestyle changes.  A1c 5.2 today, improved from 5.7 at last visit.  Follow-up in 3 months.

## 2022-03-22 NOTE — Patient Instructions (Signed)
It was wonderful to see you today.  Please bring ALL of your medications with you to every visit.   Updates from today's visit:  Please continue taking 1 mg of Ozempic weekly  Please follow up in 3 months   Thank you for choosing [facility].   Please call [phone] with any questions about today's appointment.  Please be sure to schedule follow up at the front  desk before you leave today.   Vonna Drafts, MD  Family Medicine

## 2022-03-26 ENCOUNTER — Other Ambulatory Visit (HOSPITAL_COMMUNITY): Payer: Self-pay

## 2022-03-31 ENCOUNTER — Other Ambulatory Visit (HOSPITAL_COMMUNITY): Payer: Self-pay

## 2022-05-10 ENCOUNTER — Other Ambulatory Visit: Payer: Self-pay | Admitting: Family Medicine

## 2022-05-20 ENCOUNTER — Other Ambulatory Visit (HOSPITAL_COMMUNITY): Payer: Self-pay

## 2022-05-20 ENCOUNTER — Telehealth: Payer: Self-pay

## 2022-05-20 MED ORDER — WEGOVY 1 MG/0.5ML ~~LOC~~ SOAJ: 1.0000 mg | SUBCUTANEOUS | 3 refills | Status: DC

## 2022-05-20 NOTE — Telephone Encounter (Signed)
Sent in rx for wegovy - will this be covered?  Thanks Leeanne Rio, MD

## 2022-05-20 NOTE — Telephone Encounter (Signed)
A Prior Authorization was initiated for this patients OZEMPIC through CoverMyMeds.   Key: [id number]

## 2022-05-20 NOTE — Addendum Note (Signed)
Addended by: Leeanne Rio on: 05/20/2022 05:20 PM   Modules accepted: Orders

## 2022-05-20 NOTE — Telephone Encounter (Signed)
Prior Auth for patients medication OZEMPIC denied by FEP via CoverMyMeds.   Reason:   CoverMyMeds Key: [id number]

## 2022-05-21 NOTE — Telephone Encounter (Signed)
A Prior Authorization was initiated for this patients WEGOVY through CoverMyMeds.   Key: [id number]

## 2022-05-21 NOTE — Telephone Encounter (Signed)
Prior Auth for patients medication WEGOVY approved by FEP from 05/21/22 to 11/17/22.  CoverMyMeds Key: [id number]

## 2022-05-26 ENCOUNTER — Other Ambulatory Visit (HOSPITAL_COMMUNITY): Payer: Self-pay

## 2022-05-26 NOTE — Telephone Encounter (Signed)
Called in $0 copay card to help with patients copay on primary insurance:

## 2022-06-07 ENCOUNTER — Ambulatory Visit (INDEPENDENT_AMBULATORY_CARE_PROVIDER_SITE_OTHER): Payer: Federal, State, Local not specified - PPO | Admitting: Family Medicine

## 2022-06-07 ENCOUNTER — Other Ambulatory Visit: Payer: Self-pay

## 2022-06-07 ENCOUNTER — Encounter: Payer: Self-pay | Admitting: Family Medicine

## 2022-06-07 VITALS — BP 120/89 | HR 63 | Ht 69.0 in | Wt 289.0 lb

## 2022-06-07 DIAGNOSIS — M25561 Pain in right knee: Secondary | ICD-10-CM | POA: Diagnosis not present

## 2022-06-07 DIAGNOSIS — Z1211 Encounter for screening for malignant neoplasm of colon: Secondary | ICD-10-CM

## 2022-06-07 DIAGNOSIS — M25562 Pain in left knee: Secondary | ICD-10-CM | POA: Diagnosis not present

## 2022-06-07 DIAGNOSIS — F17200 Nicotine dependence, unspecified, uncomplicated: Secondary | ICD-10-CM | POA: Diagnosis not present

## 2022-06-07 DIAGNOSIS — Z1231 Encounter for screening mammogram for malignant neoplasm of breast: Secondary | ICD-10-CM

## 2022-06-07 DIAGNOSIS — I2699 Other pulmonary embolism without acute cor pulmonale: Secondary | ICD-10-CM

## 2022-06-07 DIAGNOSIS — D6861 Antiphospholipid syndrome: Secondary | ICD-10-CM | POA: Diagnosis not present

## 2022-06-07 DIAGNOSIS — Z23 Encounter for immunization: Secondary | ICD-10-CM | POA: Diagnosis not present

## 2022-06-07 NOTE — Progress Notes (Unsigned)
  Date of Visit: 06/07/2022   SUBJECTIVE:   HPI:  Angelica Brown presents today for routine follow-up.  Obesity: Has been taking semaglutide 1 mg weekly.  Had previously been taking Ozempic, but recently her insurance would only cover Wegovy.  She has had issues with administering the [facility] to herself, and has ended up wasting a couple of doses.  She feels like her weight loss has stalled.  Continues to tolerate the medication well.  She would like to switch back to Ozempic if possible.  Knee pain: Notices her knees buckling with walking sometimes.  Has aches in her knees.  Worse the week prior to her period.  Has tried Tylenol and hot baths but still with pain.  Still smoking 5 to 7 cigarettes a day  Mood has been doing okay overall.  OBJECTIVE:   BP 120/89   Pulse 63   Ht 5' 9"$  (1.753 m)   Wt 289 lb (131.1 kg)   SpO2 96%   BMI 42.68 kg/m  Gen: No acute distress, pleasant, cooperative HEENT: Normocephalic, atraumatic Heart: Regular rate and rhythm, no murmur Lungs: Clear to auscultation bilaterally, normal effort Neuro: Grossly nonfocal, speech normal Ext: No edema bilateral lower extremities.  Bilateral knees without effusion, warmth, or tenderness.  MCL and LCL intact to varus and valgus stressing bilaterally.  Negative Lachman bilaterally.  Patellar movement normal bilaterally.  No crepitus of knees.  ASSESSMENT/PLAN:   Health maintenance:  -Referral for colonoscopy entered -COVID booster given today -Mammogram ordered, given instructions on how to schedule  Bilateral knee pain Exam overall unremarkable today.  Suspect some degree of underlying osteoarthritis, possibly mediated by obesity.  Limited in options for oral medications due to her medical history.  Trial of capsaicin cream.  No prior imaging.  Check knee x-rays, ordered.  Antiphospholipid antibody syndrome (HCC) Tolerating anticoagulation well.  Update CBC today.  OBESITY, MORBID Ideally we will get her switch  back to Ozempic due to issues administering Wegovy.  I will reach out to [facility] about trying to redo prior authorization for this.  Once we have a sense of whether it will be covered, likely will increase dose since weight loss has stalled.  TOBACCO DEPENDENCE Continued to encourage cessation.    Vina. Ardelia Mems, [place]

## 2022-06-07 NOTE — Patient Instructions (Addendum)
It was great to see you again today.  Go get xrays of knees Can try capsaicin cream  Checking blood counts today  Referring to GI for colonoscopy  Keep trying to quit smoking  Will try to resubmit ozempic approval  COVID vaccine today  To schedule your mammogram, call the [place] at [phone]. They are located at [address].     Be well, Dr. Ardelia Mems

## 2022-06-08 ENCOUNTER — Encounter: Payer: Self-pay | Admitting: Internal Medicine

## 2022-06-08 LAB — CBC
Hematocrit: 39.4 % (ref 34.0–46.6)
Hemoglobin: 13 g/dL (ref 11.1–15.9)
MCH: 28.7 pg (ref 26.6–33.0)
MCHC: 33 g/dL (ref 31.5–35.7)
MCV: 87 fL (ref 79–97)
Platelets: 307 10*3/uL (ref 150–450)
RBC: 4.53 x10E6/uL (ref 3.77–5.28)
RDW: 15.6 % — ABNORMAL HIGH (ref 11.7–15.4)
WBC: 9.8 10*3/uL (ref 3.4–10.8)

## 2022-06-09 DIAGNOSIS — M25561 Pain in right knee: Secondary | ICD-10-CM | POA: Insufficient documentation

## 2022-06-09 NOTE — Assessment & Plan Note (Signed)
Continued to encourage cessation. 

## 2022-06-09 NOTE — Assessment & Plan Note (Signed)
Ideally we will get her switch back to Ozempic due to issues administering Wegovy.  I will reach out to [facility] about trying to redo prior authorization for this.  Once we have a sense of whether it will be covered, likely will increase dose since weight loss has stalled.

## 2022-06-09 NOTE — Assessment & Plan Note (Signed)
Tolerating anticoagulation well.  Update CBC today.

## 2022-06-09 NOTE — Assessment & Plan Note (Signed)
Exam overall unremarkable today.  Suspect some degree of underlying osteoarthritis, possibly mediated by obesity.  Limited in options for oral medications due to her medical history.  Trial of capsaicin cream.  No prior imaging.  Check knee x-rays, ordered.

## 2022-06-22 ENCOUNTER — Other Ambulatory Visit: Payer: Self-pay

## 2022-06-22 DIAGNOSIS — I252 Old myocardial infarction: Secondary | ICD-10-CM

## 2022-06-25 MED ORDER — METOPROLOL TARTRATE 25 MG PO TABS: 12.5000 mg | ORAL_TABLET | Freq: Two times a day (BID) | ORAL | 3 refills | Status: DC

## 2022-07-01 ENCOUNTER — Ambulatory Visit (INDEPENDENT_AMBULATORY_CARE_PROVIDER_SITE_OTHER): Payer: Federal, State, Local not specified - PPO | Admitting: Internal Medicine

## 2022-07-01 ENCOUNTER — Encounter: Payer: Self-pay | Admitting: Internal Medicine

## 2022-07-01 VITALS — BP 118/68 | HR 60 | Ht 69.0 in | Wt 286.0 lb

## 2022-07-01 DIAGNOSIS — R760 Raised antibody titer: Secondary | ICD-10-CM | POA: Diagnosis not present

## 2022-07-01 DIAGNOSIS — Z1211 Encounter for screening for malignant neoplasm of colon: Secondary | ICD-10-CM

## 2022-07-01 NOTE — Progress Notes (Signed)
HISTORY OF PRESENT ILLNESS:  Angelica Brown is a 47 y.o. female who was sent today by [facility] family practice regarding colon cancer screening.  Patient has multiple significant medical problems including, but not limited to morbid obesity, hypercoagulable state with lupus anticoagulant, coronary artery disease with prior myocardial infarction requiring coronary artery bare-metal stent placement with stent thrombosis and subsequent drug-eluting stent placement.  Currently on Effient.  May also be on oral anticoagulant, but she cannot recall the name.  Followed by Dr. Tamala Julian of cardiology.  She takes mobility.  Has lost 100 pounds.  History of DVT.  No family history of colon cancer.  Review of blood work shows normal hemoglobin of 13.0.  GI review of systems is negative except for belching.  No change in bowel habits or bleeding.  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    acute dural venous sinus thrombosis and right mastoid effusion/notes 05/28/2016   GERD (gastroesophageal reflux disease)    Gestational diabetes    "w/all 4 pregnancies"   Heart murmur    Hypertension    Metrorrhagia    Migraine    "at least twice/month" (05/28/2016)   Nystagmus 06/30/2016   OBESITY, MORBID    POSTURAL LIGHTHEADEDNESS    Sickle cell trait ([place])    STEMI (ST elevation myocardial infarction) ([place]) 11/09/2017   in [place], [place] - see records in [place]    Throat pain    TOBACCO DEPENDENCE     Past Surgical History:   Procedure Laterality Date   BARTHOLIN GLAND CYST EXCISION Bilateral 2004   CORONARY THROMBECTOMY N/A 05/04/2018   Procedure: Coronary Thrombectomy;  Surgeon: Belva Crome, MD;  Location: [place] [facility];  Service: Cardiovascular;  Laterality: N/A;   EXCISIONAL HEMORRHOIDECTOMY  2006   "removed a blood clot from one"   LEFT HEART CATH AND CORONARY ANGIOGRAPHY N/A 05/04/2018   Procedure: LEFT HEART CATH AND CORONARY ANGIOGRAPHY;  Surgeon: Belva Crome, MD;  Location: [place] [facility];  Service: Cardiovascular;  Laterality: N/A;   TUBAL LIGATION  2007   WISDOM TOOTH EXTRACTION  1998    Social History Angelica Brown  reports that she has been smoking cigarettes. She has a 9.00 pack-year smoking history. She has been exposed to tobacco smoke. She has never used smokeless tobacco. She reports that she does not drink alcohol and does not use drugs.  family history includes Breast cancer in her maternal aunt, mother, and paternal aunt; Cancer in her father; Diabetes in her father, paternal grandfather, and paternal grandmother.  Allergies  Allergen Reactions   Ampicillin Anaphylaxis, Shortness Of Breath and Swelling    Throat swells   Penicillins Anaphylaxis and Shortness Of Breath    Throat swelling Has patient had a PCN reaction causing immediate rash, facial/tongue/throat swelling, SOB or lightheadedness with hypotension: Yes Has patient had a PCN reaction causing severe rash involving mucus membranes or skin necrosis: No Has patient had a PCN reaction that required  hospitalization: No Has patient had a PCN reaction occurring within the last 10 years: Yes If all of the above answers are "NO", then may proceed with Cephalosporin use.    Strawberry Extract Anaphylaxis, Hives, Itching and Swelling   Cephalexin Hives, Itching and Swelling   Prednisone Nausea And Vomiting, Rash and Other (See Comments)    Cramping in legs, could barely walk        PHYSICAL  EXAMINATION: Vital signs: BP 118/68   Pulse 60   Ht '5\' 9"'$  (1.753 m)   Wt 286 lb (129.7 kg)   SpO2 98%   BMI 42.23 kg/m   Constitutional: generally well-appearing, no acute distress Psychiatric: alert and oriented x3, cooperative Eyes: extraocular movements intact, anicteric, conjunctiva pink Mouth: oral pharynx moist, no lesions Neck: supple no lymphadenopathy Cardiovascular: heart regular rate and rhythm, no murmur Lungs: clear to auscultation bilaterally Abdomen: soft, obese, nontender, nondistended, no obvious ascites, no peritoneal signs, normal bowel sounds, no organomegaly Rectal: Omitted Extremities: no clubbing, cyanosis, or lower extremity edema bilaterally Skin: no lesions on visible extremities Neuro: No focal deficits.  Cranial nerves intact  ASSESSMENT:  1.  Colon cancer screening.  Average risk for colorectal neoplasia 2.  MULTIPLE significant medical problems.  High risk for endoscopic procedures. 3.  Blood clotting disorder.  Coronary artery disease.  On Effient.  May also be on oral anticoagulant (patient not sure) 4.  Morbid obesity and diabetes on Wegovy   PLAN:  1.  We discussed multiple options for average risk (for neoplasia) colon cancer screening.  This included the stool based tests of FIT Cologuard.  As well, optical colonoscopy.  The pros and cons of each strategy were discussed in detail.  To this end, the patient wishes to proceed with Cologuard testing.  I support this given her multiple comorbidities and blood clotting/blood thinner related issues.  If she were to have a positive Cologuard test, we would need to move forward with high risk colonoscopy. A total time of 45 minutes was spent preparing to see the patient, reviewing data, obtaining comprehensive history, performing medically appropriate physical exam, counseling and educating the patient regarding above listed issues, Cologuard testing, and documenting clinical information in the health  record.

## 2022-07-01 NOTE — Patient Instructions (Addendum)
Your provider has ordered Cologuard testing as an option for colon cancer screening. This is performed by [employer] and may be out of network with your insurance. PRIOR to completing the test, it is YOUR responsibility to contact your insurance about covered benefits for this test. Your out of pocket expense could be anywhere from $0.00 to $649.00.   When you call to check coverage with your insurer, please provide the following information:   -The ONLY provider of Cologuard is [place] code for Cologuard is [phone].  [employer] NPI # [id number]  -[employer] Tax ID # [id number]   We have already sent your demographic and insurance information to [employer] (phone number [phone]) and they should contact you within the next week regarding your test. If you have not heard from them within the next week, please call our office at [phone].  _______________________________________________________  If your blood pressure at your visit was 140/90 or greater, please contact your primary care physician to follow up on this.  _______________________________________________________  If you are age 43 or older, your body mass index should be between 23-30. Your Body mass index is 42.23 kg/m. If this is out of the aforementioned range listed, please consider follow up with your Primary Care Provider.  If you are age 45 or younger, your body mass index should be between 19-25. Your Body mass index is 42.23 kg/m. If this is out of the aformentioned range listed, please consider follow up with your Primary Care Provider.   ________________________________________________________  The [place] [facility] providers would like to encourage you to use [facility] to communicate with providers for non-urgent requests or questions.  Due to long hold times on the telephone, sending your provider a message by [facility] may be a faster and more efficient  way to get a response.  Please allow 48 business hours for a response.  Please remember that this is for non-urgent requests.  _______________________________________________________

## 2022-07-23 ENCOUNTER — Encounter: Payer: Self-pay | Admitting: Family Medicine

## 2022-07-23 ENCOUNTER — Ambulatory Visit (INDEPENDENT_AMBULATORY_CARE_PROVIDER_SITE_OTHER): Payer: Federal, State, Local not specified - PPO | Admitting: Family Medicine

## 2022-07-23 ENCOUNTER — Ambulatory Visit (HOSPITAL_COMMUNITY)
Admission: RE | Admit: 2022-07-23 | Discharge: 2022-07-23 | Disposition: A | Discharge status: Home / Self Care | Payer: Federal, State, Local not specified - PPO | Source: Ambulatory Visit | Attending: Family Medicine | Admitting: Family Medicine

## 2022-07-23 VITALS — BP 118/90 | HR 64 | Ht 69.0 in | Wt 295.0 lb

## 2022-07-23 DIAGNOSIS — R1010 Upper abdominal pain, unspecified: Secondary | ICD-10-CM | POA: Diagnosis not present

## 2022-07-23 DIAGNOSIS — R112 Nausea with vomiting, unspecified: Secondary | ICD-10-CM

## 2022-07-23 MED ORDER — ONDANSETRON 4 MG PO TBDP: 4.0000 mg | ORAL_TABLET | Freq: Three times a day (TID) | ORAL | 0 refills | Status: DC | PRN

## 2022-07-23 NOTE — Progress Notes (Signed)
SUBJECTIVE:   CHIEF COMPLAINT / HPI:   Angelica Brown is a 47 y.o. female who presents to the [facility] clinic today to discuss the following concerns:   Vomiting, Gas Pain States that she was switched from Ozempic to [place], isn't sure if that is what is causing her symptoms. She has had 6 doses of Wegovy, taking 1 mg dose. She reports she was previously taking 1 mg of Ozempic, though there was about a month between stopping Ozempic and starting Wegovy. Since Thursday night (7 days ago) she has had constant burping and vomiting. Feels like she can't keep things down. Is having watery bowel movements.   Has had two episodes of vomiting today.  No blood in vomit or stool.  Reports nausea is intermittent.  Symptoms are the same since onset, not improving or worsening.  No fevers. No sick contacts. No recent travel.  Upper abdomen is sore, "feels like I have been doing 100 sit ups".   Symptoms do NOT feel similar to when she had MI. She does report she had burping with first MI (10/2017) but she broke out into a cold sweat, felt like she had a lump in her throat. The second time (01/2018), she woke up and felt a lump in her throat and felt sweaty even in the shower, also had some nausea. The third time (04/2018), started with burping and then lump in her throat. She did have nausea/vomiting. She never had any chest pain with her MI.   Not sexually active for the last 5 months.   PERTINENT  PMH / PSH: Lupus anticoagulant, OSA, CHF, tobacco use disorder, sickle cell trait, history of MI, prediabetes  OBJECTIVE:   BP (!) 118/90   Pulse 64   Ht 5\' 9"  (1.753 m)   Wt 295 lb (133.8 kg)   LMP 06/24/2022   SpO2 99%   BMI 43.56 kg/m    General: NAD, pleasant, able to participate in exam Cardiac: RRR, no murmurs. Respiratory: CTAB, normal effort, No wheezes, rales or rhonchi Abdomen: Obese abdomen, normoactive bowel sounds, ttp RUQ, epigastric without R/G, soft. Neg Murphy sign  Skin:  warm and dry, no rashes noted Psych: Normal affect and mood  ASSESSMENT/PLAN:   1. 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 She does not appear to be in distress and appears well hydrated still. Abdomen today is soft, nondistended, normoactive bowel sounds, without rebound or guarding.  She does have significant tenderness to palpation of right upper quadrant, epigastrium but without Murphy sign. Differential is broad and includes biliary etiologies, pancreatitis, PUD, MI (given hx, though patient states this feels different. EKG without new ST changes), GERD, gastroenteritis, hepatitis (seems less likely) intestinal ischemia (seems less likely), PNA (less likely without cough, fever). No concern for obstruction given she is having bowel movements.  - EKG 12-Lead: without ST changes; stable from previous.  - Lipase - CBC with Differential - Comprehensive metabolic panel - US Abdomen Complete; Future - Zofran 4 mg q8h PRN nausea  - Strict ED precautions discussed with patient.   2. Upper abdominal pain Given RUQ and epigastric pain, will proceed with imaging. Unclear cause, see above for differential. Strict return precautions provided to patient should the pain worsen over the weekend.  - US Abdomen Complete; Future  Sharion Settler, [place]

## 2022-07-23 NOTE — Patient Instructions (Addendum)
It was wonderful to see you today.  Please bring ALL of your medications with you to every visit.   Today we talked about:  We are doing lab work today to check your electrolytes, liver, kidneys, pancreas. I will send you a MyChart message if you have MyChart. Otherwise, I will give you a call for abnormal results or send a letter if everything returned back normal. If you don't hear from me in 2 weeks, please call the office.    I am ordering an ultrasound of your abdomen.  Will let you know when that is scheduled.  I will send medication for nausea that you can take every 8 hours.  Drink plenty of fluids, eat blander foods until you feel better.   If you develop worsening pain, chest pain, back pain, shortness of breath, recurrent nausea and vomiting and unable to keep fluids down, please go to the emergency department to be evaluated.  Thank you for coming to your visit as scheduled. We have had a large "no-show" problem lately, and this significantly limits our ability to see and care for patients. As a friendly reminder- if you cannot make your appointment please call to cancel. We do have a no show policy for those who do not cancel within 24 hours. Our policy is that if you miss or fail to cancel an appointment within 24 hours, 3 times in a 47-month period, you may be dismissed from our clinic.   Thank you for choosing [facility].   Please call [phone] with any questions about today's appointment.  Please be sure to schedule follow up at the front  desk before you leave today.   Sabino Dick, DO PGY-3 Family Medicine

## 2022-07-24 LAB — COMPREHENSIVE METABOLIC PANEL
ALT: 13 IU/L (ref 0–32)
AST: 12 IU/L (ref 0–40)
Albumin/Globulin Ratio: 1.4 (ref 1.2–2.2)
Albumin: 4.3 g/dL (ref 3.9–4.9)
Alkaline Phosphatase: 74 IU/L (ref 44–121)
BUN/Creatinine Ratio: 15 (ref 9–23)
BUN: 11 mg/dL (ref 6–24)
Bilirubin Total: 0.4 mg/dL (ref 0.0–1.2)
CO2: 24 mmol/L (ref 20–29)
Calcium: 9.6 mg/dL (ref 8.7–10.2)
Chloride: 105 mmol/L (ref 96–106)
Creatinine, Ser: 0.74 mg/dL (ref 0.57–1.00)
Globulin, Total: 3 g/dL (ref 1.5–4.5)
Glucose: 64 mg/dL — ABNORMAL LOW (ref 70–99)
Potassium: 4.3 mmol/L (ref 3.5–5.2)
Sodium: 141 mmol/L (ref 134–144)
Total Protein: 7.3 g/dL (ref 6.0–8.5)
eGFR: 102 mL/min/{1.73_m2} (ref >59)

## 2022-07-24 LAB — CBC WITH DIFFERENTIAL/PLATELET
Basophils Absolute: 0.1 10*3/uL (ref 0.0–0.2)
Basos: 1 %
EOS (ABSOLUTE): 0.2 10*3/uL (ref 0.0–0.4)
Eos: 2 %
Hematocrit: 43.5 % (ref 34.0–46.6)
Hemoglobin: 14.3 g/dL (ref 11.1–15.9)
Immature Grans (Abs): 0 10*3/uL (ref 0.0–0.1)
Immature Granulocytes: 0 %
Lymphocytes Absolute: 4.1 10*3/uL — ABNORMAL HIGH (ref 0.7–3.1)
Lymphs: 37 %
MCH: 29.3 pg (ref 26.6–33.0)
MCHC: 32.9 g/dL (ref 31.5–35.7)
MCV: 89 fL (ref 79–97)
Monocytes Absolute: 1 10*3/uL — ABNORMAL HIGH (ref 0.1–0.9)
Monocytes: 9 %
Neutrophils Absolute: 5.8 10*3/uL (ref 1.4–7.0)
Neutrophils: 51 %
Platelets: 342 10*3/uL (ref 150–450)
RBC: 4.88 x10E6/uL (ref 3.77–5.28)
RDW: 15.3 % (ref 11.7–15.4)
WBC: 11.1 10*3/uL — ABNORMAL HIGH (ref 3.4–10.8)

## 2022-07-24 LAB — LIPASE: Lipase: 21 U/L (ref 14–72)

## 2022-07-26 ENCOUNTER — Ambulatory Visit: Payer: Federal, State, Local not specified - PPO

## 2022-07-29 ENCOUNTER — Ambulatory Visit (HOSPITAL_COMMUNITY): Payer: Federal, State, Local not specified - PPO

## 2022-07-30 ENCOUNTER — Ambulatory Visit: Payer: Federal, State, Local not specified - PPO

## 2022-08-04 ENCOUNTER — Ambulatory Visit (HOSPITAL_COMMUNITY): Payer: Federal, State, Local not specified - PPO

## 2022-08-27 ENCOUNTER — Telehealth: Payer: Self-pay

## 2022-08-27 ENCOUNTER — Other Ambulatory Visit: Payer: Self-pay

## 2022-08-27 NOTE — Telephone Encounter (Addendum)
Patient attempted to be outreached by Tajay Muzzy, PharmD Candidate on 08/27/2022 to discuss hypertension. Left voicemail for patient to return our call at their convenience at [phone]  Darienne Belleau, PharmD Candidate 

## 2022-08-27 NOTE — Progress Notes (Signed)
   Angelica Brown May 01, 1975 [id number]  Pt has home bp machine but is not currently using. Counseling needed for pt on how to use it and document bp readings.   Patient outreached by Madison Hickman, PharmD Candidate on 08/27/2022 while they were picking up prescriptions at [facility].  Blood Pressure Readings: Last documented ambulatory systolic blood pressure: 118 Last documented ambulatory diastolic blood pressure: 90 Does the patient have a validated home blood pressure machine?: Yes, pt has not used home bp machine but does own one. Advised her to keep a log of daily or weekly bp readings for PCP appointment.  Medication review was performed. Is the patient taking their medications as prescribed?: Yes Differences from their prescribed list include: Zofran is prn  The following barriers to adherence were noted: Does the patient have cost concerns?: No Does the patient have transportation concerns?: No Does the patient need assistance obtaining refills?: No Does the patient occassionally forget to take some of their prescribed medications?: Yes Does the patient feel like one/some of their medications make them feel poorly?: Yes Does the patient have questions or concerns about their medications?: Yes (talking with PCP on monday about ozempic) Does the patient have a follow up scheduled with their primary care provider/cardiologist?: Yes   Interventions: Interventions Completed: Medications were reviewed, Patient was counseled on lifestyle modifications to improve blood pressure, including, Patient was educated on proper technique to check home blood pressure and reminded to bring home machine and readings to next provider appointment  The patient has follow up scheduled:  PCP: Latrelle Dodrill, MD   [facility], Student-PharmD

## 2022-08-30 ENCOUNTER — Ambulatory Visit: Payer: Federal, State, Local not specified - PPO | Admitting: Family Medicine

## 2022-09-06 ENCOUNTER — Ambulatory Visit: Payer: Federal, State, Local not specified - PPO

## 2022-09-19 ENCOUNTER — Other Ambulatory Visit: Payer: Self-pay | Admitting: Family Medicine

## 2022-09-29 ENCOUNTER — Telehealth: Payer: Self-pay

## 2022-09-29 NOTE — Telephone Encounter (Signed)
Angelica Fredrickson, MD  Chrystie Nose, RN This patient wanted to do Cologuard.  Does not look like it was done.  I was sent this outstanding test notice.  Please investigate.  Thanks  Left message for pt to call back.

## 2022-10-05 ENCOUNTER — Other Ambulatory Visit (HOSPITAL_COMMUNITY): Payer: Self-pay

## 2022-10-19 DIAGNOSIS — H538 Other visual disturbances: Secondary | ICD-10-CM | POA: Diagnosis not present

## 2022-10-19 DIAGNOSIS — R457 State of emotional shock and stress, unspecified: Secondary | ICD-10-CM | POA: Diagnosis not present

## 2022-10-19 DIAGNOSIS — R202 Paresthesia of skin: Secondary | ICD-10-CM | POA: Diagnosis not present

## 2022-11-11 ENCOUNTER — Other Ambulatory Visit (HOSPITAL_COMMUNITY): Payer: Self-pay

## 2022-11-11 ENCOUNTER — Telehealth: Payer: Self-pay

## 2022-11-11 NOTE — Telephone Encounter (Signed)
Pharmacy Patient Advocate Encounter   Received notification from CoverMyMeds that prior authorization for [facility] is required/requested.  PA required; PA submitted to [facility] via CoverMyMeds Key/confirmation #/[id number] Status is pending

## 2022-11-12 NOTE — Telephone Encounter (Signed)
Angelica Brown,  Is there a frequency of follow up her insurance requires in order to continue paying for this medication?  In February 2023 (1.5 years ago) she was 372lb. Three months ago she was 295lb.  She has sustained weight loss of over 75lb (over 20% of initial body weight) so I'm not sure why this does not qualify as maintained weight loss.  We can certainly get her in for a follow up appointment to document ongoing maintenance of weight loss - it would be helpful to know how often they require this.  Thanks!! Latrelle Dodrill, MD

## 2022-11-12 NOTE — Telephone Encounter (Signed)
Pharmacy Patient Advocate Encounter  Received notification from [facility] that Prior Authorization for [facility] has been DENIED. Please advise how you'd like to proceed. Full denial letter will be uploaded to the media tab. See denial reason below.  REASON: continuation of treatment for chronic weight management without a clinically significant improvement in weight and/or having maintained weight loss while on this medication does not establish medical necessity for this drug.  Patient needs f/u appt with documentation of improvement.

## 2022-11-12 NOTE — Telephone Encounter (Signed)
Admin team, can you schedule patient for a follow up appointment with me? In order to get her Reginal Lutes covered she needs to follow up for weight loss.  She had an appointment 5/13 but no showed.  Thanks! Latrelle Dodrill, MD

## 2022-11-15 ENCOUNTER — Encounter: Payer: Self-pay | Admitting: Family Medicine

## 2022-11-15 NOTE — Telephone Encounter (Signed)
Sent patient a Radio broadcast assistant asking her to call & schedule Latrelle Dodrill, MD

## 2022-11-15 NOTE — Telephone Encounter (Signed)
Perfect.   Thanks everyone! Keep me updated and I'll renew the PA once patient is seen and chart notes are complete :)

## 2022-11-15 NOTE — Telephone Encounter (Signed)
Called patient - there was no answer and vm is full. Was not able to leave message.   Thanks [employer]

## 2022-11-24 NOTE — Progress Notes (Unsigned)
    SUBJECTIVE:   CHIEF COMPLAINT / HPI:   HA, congestion Would like COVID test.  PERTINENT  PMH / PSH: ***  OBJECTIVE:   There were no vitals taken for this visit. ***  General: NAD, pleasant, able to participate in exam Cardiac: RRR, no murmurs. Respiratory: CTAB, normal effort, No wheezes, rales or rhonchi Abdomen: Bowel sounds present, nontender, nondistended Extremities: no edema or cyanosis. Skin: warm and dry, no rashes noted Neuro: alert, no obvious focal deficits Psych: Normal affect and mood  ASSESSMENT/PLAN:   No problem-specific Assessment & Plan notes found for this encounter.     Dr. Elberta Fortis, DO [facility]    {    This will disappear when note is signed, click to select method of visit    :1}

## 2022-11-25 ENCOUNTER — Ambulatory Visit (INDEPENDENT_AMBULATORY_CARE_PROVIDER_SITE_OTHER): Payer: Federal, State, Local not specified - PPO | Admitting: Family Medicine

## 2022-11-25 VITALS — BP 139/84 | HR 59 | Temp 98.1°F | Ht 69.0 in | Wt 294.4 lb

## 2022-11-25 DIAGNOSIS — U071 COVID-19: Secondary | ICD-10-CM

## 2022-11-25 DIAGNOSIS — J988 Other specified respiratory disorders: Secondary | ICD-10-CM | POA: Diagnosis not present

## 2022-11-25 LAB — POC SOFIA SARS ANTIGEN FIA: SARS Coronavirus 2 Ag: POSITIVE — AB

## 2022-11-25 MED ORDER — GUAIFENESIN ER 600 MG PO TB12
600.0000 mg | ORAL_TABLET | Freq: Two times a day (BID) | ORAL | 0 refills | Status: DC
Start: 2022-11-25 — End: 2022-12-13

## 2022-11-25 MED ORDER — MOLNUPIRAVIR EUA 200MG CAPSULE
4.0000 | ORAL_CAPSULE | Freq: Two times a day (BID) | ORAL | 0 refills | Status: DC
Start: 2022-11-25 — End: 2022-11-25

## 2022-11-25 NOTE — Assessment & Plan Note (Addendum)
Meets criteria for Paxlovid but currently on anticoagulation with Pradaxa therefore would not stop for COVID treatment.  Considered molnupiravir but insufficient evidence. -Recommend supportive care with Tylenol for pain, honey for sore throat and Mucinex for congestion -Work note provided, recommend masking if febrile -ED return precautions

## 2022-11-25 NOTE — Patient Instructions (Addendum)
It was wonderful to see you today! Thank you for choosing [facility].   Please bring ALL of your medications with you to every visit.   Today we talked about:  You are positive for COVID. I sent in the Mucinex to use and you can also obtain it over the counter. I would use Tylenol for pain and you can use honey for sore throat pain. Please remember if you become short of breath or symptoms significantly worsen please go to the hospital. I would not recommend treatment at this time as it would interact with the blood thinner you are on.  Please follow up as needed for persistent symptoms  If you haven't already, sign up for My Chart to have easy access to your labs results, and communication with your primary care physician.  Call the clinic at [phone] if your symptoms worsen or you have any concerns.  Please be sure to schedule follow up at the front desk before you leave today.   Elberta Fortis, DO Family Medicine

## 2022-12-13 ENCOUNTER — Ambulatory Visit (INDEPENDENT_AMBULATORY_CARE_PROVIDER_SITE_OTHER): Payer: Federal, State, Local not specified - PPO | Admitting: Family Medicine

## 2022-12-13 ENCOUNTER — Encounter: Payer: Self-pay | Admitting: Family Medicine

## 2022-12-13 ENCOUNTER — Other Ambulatory Visit: Payer: Self-pay

## 2022-12-13 VITALS — BP 103/68 | HR 56 | Temp 98.1°F | Ht 69.0 in | Wt 292.4 lb

## 2022-12-13 DIAGNOSIS — E785 Hyperlipidemia, unspecified: Secondary | ICD-10-CM | POA: Diagnosis not present

## 2022-12-13 DIAGNOSIS — I252 Old myocardial infarction: Secondary | ICD-10-CM | POA: Diagnosis not present

## 2022-12-13 DIAGNOSIS — I2699 Other pulmonary embolism without acute cor pulmonale: Secondary | ICD-10-CM

## 2022-12-13 DIAGNOSIS — I10 Essential (primary) hypertension: Secondary | ICD-10-CM

## 2022-12-13 DIAGNOSIS — R8290 Unspecified abnormal findings in urine: Secondary | ICD-10-CM | POA: Diagnosis not present

## 2022-12-13 DIAGNOSIS — H7091 Unspecified mastoiditis, right ear: Secondary | ICD-10-CM

## 2022-12-13 LAB — POCT URINALYSIS DIP (MANUAL ENTRY)
Bilirubin, UA: NEGATIVE
Glucose, UA: NEGATIVE mg/dL
Ketones, POC UA: NEGATIVE mg/dL
Leukocytes, UA: NEGATIVE
Nitrite, UA: NEGATIVE
Protein Ur, POC: NEGATIVE mg/dL
Spec Grav, UA: >=1.03 — AB (ref 1.010–1.025)
Urobilinogen, UA: 0.2 E.U./dL
pH, UA: 5.5 (ref 5.0–8.0)

## 2022-12-13 LAB — POCT UA - MICROSCOPIC ONLY

## 2022-12-13 MED ORDER — TOPIRAMATE 50 MG PO TABS
50.0000 mg | ORAL_TABLET | Freq: Every day | ORAL | 0 refills | Status: DC
Start: 2022-12-13 — End: 2023-12-06

## 2022-12-13 MED ORDER — WEGOVY 1.7 MG/0.75ML ~~LOC~~ SOAJ: 1.7000 mg | SUBCUTANEOUS | 1 refills | Status: DC

## 2022-12-13 MED ORDER — PRASUGREL HCL 10 MG PO TABS
10.0000 mg | ORAL_TABLET | Freq: Every day | ORAL | 0 refills | Status: DC
Start: 2022-12-13 — End: 2023-12-06

## 2022-12-13 MED ORDER — ROSUVASTATIN CALCIUM 40 MG PO TABS
40.0000 mg | ORAL_TABLET | Freq: Every day | ORAL | 0 refills | Status: DC
Start: 2022-12-13 — End: 2023-12-06

## 2022-12-13 MED ORDER — METOPROLOL TARTRATE 25 MG PO TABS
12.5000 mg | ORAL_TABLET | Freq: Two times a day (BID) | ORAL | 3 refills | Status: DC
Start: 2022-12-13 — End: 2023-12-06

## 2022-12-13 MED ORDER — DABIGATRAN ETEXILATE MESYLATE 150 MG PO CAPS: 150.0000 mg | ORAL_CAPSULE | Freq: Two times a day (BID) | ORAL | 1 refills | Status: DC

## 2022-12-13 NOTE — Assessment & Plan Note (Signed)
Patient's BP slightly lower today (103/68). Given patient endorses dizziness and lightheadedness prior to starting metoprolol, recommend continuing current medication regimen.

## 2022-12-13 NOTE — Assessment & Plan Note (Deleted)
Weight loss to date

## 2022-12-13 NOTE — Patient Instructions (Signed)
It was great to see you again today.  Referring back to cardiology Go up to 1.7mg  weekly on the wegovy  Refilled medications Go get knee xrays  On your way out, schedule an appointment one morning to come back for fasting labs. Do not eat or drink anything other than water the morning of your lab appointment until after your labs are drawn.   Be well, Dr. Pollie Meyer

## 2022-12-13 NOTE — Assessment & Plan Note (Signed)
Exam unremarkable in office today. Trial of capsaicin cream attempted in past with no relief. High suspicion for osteoarthritis. Knee x-rays ordered. Patient encouraged to continue weight loss.

## 2022-12-13 NOTE — Assessment & Plan Note (Deleted)
Cardiology referral sent

## 2022-12-13 NOTE — Progress Notes (Unsigned)
    SUBJECTIVE:   CHIEF COMPLAINT / HPI:   Angelica Brown is a 47 y/o female presenting for follow up on Wegovy and to discuss referral for cardiology, x-rays of bilateral knees and urinary odor.   Obesity: Patient reports previously being prescribed Ozempic. Reports transitioning from Ozempic to [facility] due to insurance on Feb 1. Since switching, patient reports no weight loss. She denies side effects including N/V, constipation or diarrhea. Patient endorses healthy eating majority of the time and exercising 45 minutes to an hour each day consisting of walking or running around a track. Per chart review, patient has lost about 63 pounds since starting GLP-1 with sustained weight loss.  Cardiology Referral: Patient reports previously being followed by Dr. Katrinka Blazing; due to his retirement, patient requests a referral for a new cardiologist. Patient reports being seen last by a cardiologist 2 years ago.   HTN: Patient currently prescribed metoprolol 12.5 mg BID. Patient endorses lightheadedness and dizziness when bending down and standing up. She report this occurring before starting metoprolol.  Knee Pain: Patient reports bilateral knee pain that she describes as achy with feelings of stretching and tightness. Reports she had an x-ray scheduled for both knees, but was unable to make the appointment. She is requesting another referral for x-rays.  Urine Odor: Patient reports for the past 2 months experiencing urine odor. She denies fevers, burning or itchiness. She endorses she is urinating normally except for an increased odor.   PERTINENT  PMH / PSH: Antiphospholipid Antibody Syndrome, Hx of MI, Prediabetes   OBJECTIVE:   BP 103/68   Pulse (!) 56   Temp 98.1 F (36.7 C) (Oral)   Ht 5\' 9"  (1.753 m)   Wt 292 lb 6.4 oz (132.6 kg)   SpO2 99%   BMI 43.18 kg/m    General: NAD, pleasant, cooperative HEENT; Normocephalic, atraumatic Cardiac: RRR, no murmurs Respiratory: Clear to auscultation  bilaterally, normal work of breathing MSK: No tenderness to palpation along bilateral knee joint lines, bilateral leg strength intact Extremities: 2+ dp equal bilaterally  ASSESSMENT/PLAN:   OBESITY, MORBID Weight loss plateau despite healthier eating and exercise. Tolerating current medication well. Wegovy increased to 1.7 mg injections weekly.   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  Bilateral knee pain Exam unremarkable in office today. Trial of capsaicin cream attempted in past with no relief. High suspicion for osteoarthritis. Knee x-rays ordered. Patient encouraged to continue weight loss.   Hypertension Patient's BP slightly lower today (103/68). Given patient endorses dizziness and lightheadedness prior to starting metoprolol, recommend continuing current medication regimen.    Urine Odor Given patient is not endorsing burning, itchy or difficulty urinating, low suspicion for UTI; however, cannot rule out. UA ordered to assess for UTI.  Health Maintenance - Patient reports she was sent a Cologaurd kit to her previous address. She reports she called to have one sent to her current address; however, she has not received it yet. Patient encouraged to call back to check on status of kit.  - Lipid panel ordered today. Since patient was not fasting, patient encouraged to schedule a lab visit for fasting labs.  FOLLOW UP: Patient to return for lab visit and in 1 month for [facility] follow up.   Bubba Hales, Medical Student [facility]

## 2022-12-13 NOTE — Assessment & Plan Note (Addendum)
Weight loss plateau despite healthier eating and exercise. Tolerating current medication well. Wegovy increased to 1.7 mg injections weekly.

## 2022-12-14 ENCOUNTER — Ambulatory Visit (INDEPENDENT_AMBULATORY_CARE_PROVIDER_SITE_OTHER): Payer: Federal, State, Local not specified - PPO | Admitting: Student

## 2022-12-14 VITALS — BP 118/72 | HR 60 | Ht 69.0 in | Wt 292.0 lb

## 2022-12-14 DIAGNOSIS — Z113 Encounter for screening for infections with a predominantly sexual mode of transmission: Secondary | ICD-10-CM

## 2022-12-14 DIAGNOSIS — A530 Latent syphilis, unspecified as early or late: Secondary | ICD-10-CM | POA: Insufficient documentation

## 2022-12-14 NOTE — Patient Instructions (Signed)
It was great to see you today!   I am ordering labs today and should have the results back by the end of the week. I will call you when they result.   Future Appointments  Date Time Provider Department Center  01/13/2023 11:30 AM Latrelle Dodrill, MD [facility]    Please arrive 15 minutes before your appointment to ensure smooth check in process.    Please call the clinic at [phone] if your symptoms worsen or you have any concerns.  Thank you for allowing me to participate in your care, Dr. Glendale Chard [facility]

## 2022-12-14 NOTE — Assessment & Plan Note (Signed)
Most likely false positive from known autoimmune disease. Will repeat RPR today with reflex to confirm. Reassured patient and will call with results.

## 2022-12-14 NOTE — Progress Notes (Signed)
    SUBJECTIVE:   CHIEF COMPLAINT / HPI:   Angelica Brown is a 47 y.o. female  presenting for follow up for potential syphilis.   Patient went to donate plasma today and received a message saying that she was positive for syphilis. She reports having a similar scare 20 years ago when she attempted to donate plasma then. She denies ever being exposed or having a personal history of syphilis. She has a known autoimmune disorder antiphospholipid syndrome and is followed by hematology.   She has had an RPR with 1:2 titer with reflex negative t pallidum completed 1 year ago. She has not had any new partners since that test. She is in a monogamous relationship with her boyfriend for 2 years.   PERTINENT  PMH / PSH: Reviewed and updated   OBJECTIVE:   BP 118/72   Pulse 60   Ht 5\' 9"  (1.753 m)   Wt 292 lb (132.5 kg)   LMP 12/13/2022   SpO2 97%   BMI 43.12 kg/m   Well-appearing, no acute distress Cardio: Regular rate, regular rhythm, no murmurs on exam. Pulm: Clear, no wheezing, no crackles. No increased work of breathing Extremities: no peripheral edema  Psych:  Cognition and judgment appear intact. Alert, communicative  and cooperative with normal attention span and concentration. No apparent delusions, illusions, hallucinations      12/14/2022    2:10 PM 12/13/2022    9:39 AM 11/25/2022    9:32 AM  PHQ9 SCORE ONLY  PHQ-9 Total Score 0 0 0      ASSESSMENT/PLAN:   Positive RPR test Most likely false positive from known autoimmune disease. Will repeat RPR today with reflex to confirm. Reassured patient and will call with results.      Glendale Chard, DO [facility]

## 2022-12-15 ENCOUNTER — Telehealth: Payer: Self-pay | Admitting: Student

## 2022-12-15 LAB — RPR, QUANT: RPR, Quant: 1:1 {titer} — ABNORMAL HIGH

## 2022-12-15 LAB — RPR W/REFLEX TO TREPSURE: RPR: REACTIVE — AB

## 2022-12-15 NOTE — Assessment & Plan Note (Signed)
Continue current cardiac medications.  Referral to cardiology placed per patient request.

## 2022-12-15 NOTE — Telephone Encounter (Signed)
Called patient to discuss negative results for syphilis. Patient appreciative of call.   Glendale Chard, DO [facility], PGY-2 12/15/22 9:37 AM

## 2022-12-22 ENCOUNTER — Emergency Department (HOSPITAL_BASED_OUTPATIENT_CLINIC_OR_DEPARTMENT_OTHER): Payer: Federal, State, Local not specified - PPO

## 2022-12-22 ENCOUNTER — Emergency Department (HOSPITAL_BASED_OUTPATIENT_CLINIC_OR_DEPARTMENT_OTHER)
Admission: EM | Admit: 2022-12-22 | Discharge: 2022-12-23 | Disposition: A | Discharge status: Home / Self Care | Payer: Federal, State, Local not specified - PPO | Attending: Emergency Medicine | Admitting: Emergency Medicine

## 2022-12-22 ENCOUNTER — Other Ambulatory Visit: Payer: Self-pay

## 2022-12-22 ENCOUNTER — Encounter (HOSPITAL_BASED_OUTPATIENT_CLINIC_OR_DEPARTMENT_OTHER): Payer: Self-pay

## 2022-12-22 DIAGNOSIS — R42 Dizziness and giddiness: Secondary | ICD-10-CM | POA: Diagnosis not present

## 2022-12-22 DIAGNOSIS — E236 Other disorders of pituitary gland: Secondary | ICD-10-CM | POA: Diagnosis not present

## 2022-12-22 DIAGNOSIS — R519 Headache, unspecified: Secondary | ICD-10-CM | POA: Insufficient documentation

## 2022-12-22 LAB — CBC
HCT: 37.7 % (ref 36.0–46.0)
Hemoglobin: 13.5 g/dL (ref 12.0–15.0)
MCH: 29.7 pg (ref 26.0–34.0)
MCHC: 35.8 g/dL (ref 30.0–36.0)
MCV: 82.9 fL (ref 80.0–100.0)
Platelets: 279 10*3/uL (ref 150–400)
RBC: 4.55 MIL/uL (ref 3.87–5.11)
RDW: 14.2 % (ref 11.5–15.5)
WBC: 11.8 10*3/uL — ABNORMAL HIGH (ref 4.0–10.5)
nRBC: 0 % (ref 0.0–0.2)

## 2022-12-22 LAB — BASIC METABOLIC PANEL
Anion gap: 6 (ref 5–15)
BUN: 9 mg/dL (ref 6–20)
CO2: 29 mmol/L (ref 22–32)
Calcium: 9.1 mg/dL (ref 8.9–10.3)
Chloride: 105 mmol/L (ref 98–111)
Creatinine, Ser: 0.74 mg/dL (ref 0.44–1.00)
GFR, Estimated: >60 mL/min (ref >60)
Glucose, Bld: 81 mg/dL (ref 70–99)
Potassium: 3.6 mmol/L (ref 3.5–5.1)
Sodium: 140 mmol/L (ref 135–145)

## 2022-12-22 MED ORDER — TRAMADOL HCL 50 MG PO TABS
50.0000 mg | ORAL_TABLET | Freq: Once | ORAL | Status: AC
  Administered 2022-12-22: 50 mg via ORAL
  Filled 2022-12-22: qty 1

## 2022-12-22 MED ORDER — ACETAMINOPHEN 500 MG PO TABS
1000.0000 mg | ORAL_TABLET | Freq: Once | ORAL | Status: AC
  Administered 2022-12-22: 1000 mg via ORAL
  Filled 2022-12-22: qty 2

## 2022-12-22 NOTE — ED Triage Notes (Addendum)
Pt c/o headache, nausea, popping in ears, dizziness x 4 days. States this feels similar to when she previously had blood clots in her brain. 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 Pt had COVID one month ago, feeling SOB since then.

## 2022-12-22 NOTE — ED Provider Notes (Signed)
[place] [facility]PART[facility] AT [facility] Provider Note   CSN: [id number] Arrival date & time: 12/22/22  1929     History {Add pertinent medical, surgical, social history, OB history to HPI:1} Chief Complaint  Patient presents with   Dizziness    Angelica Brown is a 47 y.o. female.  Pt with c/o intermittent frontal headache in past week. Dull, comes and goes. No specific or consistent exacerbation or alleviating factors. No change w head position or time of day. No photophobia or phonophobia. No eye pain, redness or change in vision. No sinus pain or drainage. No cough or sore throat. Indicates had covid ~ 1 month ago, but has felt improved from that standpoint. No recent head trauma. No syncope. No neck pain or stiffness. No new focal or one-sided numbness or weakness. No change in speech or vision. No problems w balance, coordination or normal functional ability. No fever or chills. No vomiting. Indicates intermittent mild nausea, and has felt generally weak/fatigued. States compliant w home meds including blood thinner therapy. Denies abnormal bleeding/bruising.   The history is provided by the patient and medical records.  Dizziness Associated symptoms: headaches and nausea   Associated symptoms: no blood in stool, no chest pain, no shortness of breath, no vomiting and no weakness        Home Medications Prior to Admission medications   Medication Sig Start Date End Date Taking? Authorizing Provider  dabigatran (PRADAXA) 150 MG CAPS capsule Take 1 capsule (150 mg total) by mouth every 12 (twelve) hours. 12/13/22   Latrelle Dodrill, MD  metoprolol tartrate (LOPRESSOR) 25 MG tablet Take 0.5 tablets (12.5 mg total) by mouth 2 (two) times daily. 12/13/22   Latrelle Dodrill, MD  nitroGLYCERIN (NITROSTAT) 0.4 MG SL tablet Place 1 tablet (0.4 mg total) under the tongue every 5 (five) minutes x 3 doses as needed for chest pain. 10/26/21   Latrelle Dodrill, MD   prasugrel (EFFIENT) 10 MG TABS tablet Take 1 tablet (10 mg total) by mouth daily. 12/13/22   Latrelle Dodrill, MD  rosuvastatin (CRESTOR) 40 MG tablet Take 1 tablet (40 mg total) by mouth daily. 12/13/22   Latrelle Dodrill, MD  Semaglutide-Weight Management (WEGOVY) 1.7 MG/0.75ML SOAJ Inject 1.7 mg into the skin once a week. 12/13/22   Latrelle Dodrill, MD  topiramate (TOPAMAX) 50 MG tablet Take 1 tablet (50 mg total) by mouth daily. 12/13/22   Latrelle Dodrill, MD      Allergies    Ampicillin, Penicillins, Strawberry extract, Cephalexin, and Prednisone    Review of Systems   Review of Systems  Constitutional:  Negative for chills and fever.  HENT:  Negative for ear pain, rhinorrhea, sinus pressure, sinus pain and sore throat.   Eyes:  Negative for pain, discharge, redness and visual disturbance.  Respiratory:  Negative for cough and shortness of breath.   Cardiovascular:  Negative for chest pain.  Gastrointestinal:  Positive for nausea. Negative for abdominal pain, blood in stool and vomiting.  Genitourinary:  Negative for dysuria, flank pain and vaginal bleeding.  Musculoskeletal:  Negative for neck pain and neck stiffness.  Skin:  Negative for rash.  Neurological:  Positive for dizziness and headaches. Negative for syncope, speech difficulty, weakness and numbness.  Psychiatric/Behavioral:  Negative for confusion.     Physical Exam Updated Vital Signs BP 132/88 (BP Location: Right Arm)   Pulse (!) 52   Temp 98.3 F (36.8 C)   Resp 16  Ht 1.753 m (5\' 9" )   Wt 128.8 kg   LMP 12/16/2022   SpO2 98%   BMI 41.94 kg/m  Physical Exam Vitals and nursing note reviewed.  Constitutional:      Appearance: Normal appearance. She is well-developed.  HENT:     Head: Atraumatic.     Comments: No sinus or temporal pain, pressure or tenderness.    Nose: Nose normal. No congestion or rhinorrhea.     Mouth/Throat:     Mouth: Mucous membranes are moist.  Eyes:     General:  No scleral icterus.    Extraocular Movements: Extraocular movements intact.     Conjunctiva/sclera: Conjunctivae normal.     Pupils: Pupils are equal, round, and reactive to light.     Comments: Sharp discs, no PE noted.   Neck:     Vascular: No carotid bruit.     Trachea: No tracheal deviation.     Comments: Supple, no stiffness or rigidity.  Cardiovascular:     Rate and Rhythm: Normal rate and regular rhythm.     Pulses: Normal pulses.     Heart sounds: Normal heart sounds. No murmur heard.    No friction rub. No gallop.  Pulmonary:     Effort: Pulmonary effort is normal. No respiratory distress.     Breath sounds: Normal breath sounds.  Abdominal:     General: There is no distension.     Palpations: Abdomen is soft.     Tenderness: There is no abdominal tenderness.  Musculoskeletal:        General: No swelling or tenderness.     Cervical back: Normal range of motion and neck supple. No rigidity. No muscular tenderness.     Right lower leg: No edema.     Left lower leg: No edema.  Skin:    General: Skin is warm and dry.     Findings: No rash.  Neurological:     General: No focal deficit present.     Mental Status: She is alert and oriented to person, place, and time.     Cranial Nerves: No cranial nerve deficit.     Comments: Alert, speech normal. Motor/sens grossly intact bil. Stre 5/5. Steady gait.   Psychiatric:        Mood and Affect: Mood normal.     ED Results / Procedures / Treatments   Labs (all labs ordered are listed, but only abnormal results are displayed) Results for orders placed or performed during the hospital encounter of 12/22/22  Basic metabolic panel  Result Value Ref Range   Sodium 140 135 - 145 mmol/L   Potassium 3.6 3.5 - 5.1 mmol/L   Chloride 105 98 - 111 mmol/L   CO2 29 22 - 32 mmol/L   Glucose, Bld 81 70 - 99 mg/dL   BUN 9 6 - 20 mg/dL   Creatinine, Ser 1.61 0.44 - 1.00 mg/dL   Calcium 9.1 8.9 - 09.6 mg/dL   GFR, Estimated >04 >54  mL/min   Anion gap 6 5 - 15  CBC  Result Value Ref Range   WBC 11.8 (H) 4.0 - 10.5 K/uL   RBC 4.55 3.87 - 5.11 MIL/uL   Hemoglobin 13.5 12.0 - 15.0 g/dL   HCT 09.8 11.9 - 14.7 %   MCV 82.9 80.0 - 100.0 fL   MCH 29.7 26.0 - 34.0 pg   MCHC 35.8 30.0 - 36.0 g/dL   RDW 82.9 56.2 - 13.0 %   Platelets 279 150 -  400 K/uL   nRBC 0.0 0.0 - 0.2 %      EKG None  Radiology CT Head Wo Contrast  Result Date: 12/22/2022 CLINICAL DATA:  Headache, no red flags Pt c/o headache, nausea, popping in ears, dizziness x 4 days. EXAM: CT HEAD WITHOUT CONTRAST TECHNIQUE: Contiguous axial images were obtained from the base of the skull through the vertex without intravenous contrast. RADIATION DOSE REDUCTION: This exam was performed according to the departmental dose-optimization program which includes automated exposure control, adjustment of the mA and/or kV according to patient size and/or use of iterative reconstruction technique. COMPARISON:  MRI head 01/08/2021 FINDINGS: Brain: No evidence of large-territorial acute infarction. No parenchymal hemorrhage. No mass lesion. No extra-axial collection. No mass effect or midline shift. No hydrocephalus. Basilar cisterns are patent. Partial empty sella. Vascular: No hyperdense vessel. Skull: No acute fracture or focal lesion. Sinuses/Orbits: Paranasal sinuses and mastoid air cells are clear. The orbits are unremarkable. Other: None. IMPRESSION: 1. No acute intracranial abnormality. 2. Partial empty sella. Findings is often a normal anatomic variant but can be associated with idiopathic intracranial hypertension (pseudotumor cerebri). Electronically Signed   By: Tish Frederickson M.D.   On: 12/22/2022 22:33    Procedures Procedures  {Document cardiac monitor, telemetry assessment procedure when appropriate:1}  Medications Ordered in ED Medications  acetaminophen (TYLENOL) tablet 1,000 mg (1,000 mg Oral Given 12/22/22 2159)    ED Course/ Medical Decision Making/  A&P   {   Click here for ABCD2, HEART and other calculatorsREFRESH Note before signing :1}                              Medical Decision Making Amount and/or Complexity of Data Reviewed Labs: ordered. Radiology: ordered.  Risk OTC drugs.   Iv ns. Continuous pulse ox and cardiac monitoring. Labs ordered/sent. Imaging ordered.   Differential diagnosis includes migraine, sinusitis, sdh, anemia, dehydration, etc. Dispo decision including potential need for admission considered - will get labs and imaging and reassess.   Reviewed nursing notes and prior charts for additional history. External reports reviewed.   Cardiac monitor: sinus rhythm, rate 66.  Labs reviewed/interpreted by me - wbc 11. Hgb normal. Chem normal.   CT reviewed/interpreted by me - no hem.   Rec close pcp f/u.  Return precautions provided.    {Document critical care time when appropriate:1} {Document review of labs and clinical decision tools ie heart score, Chads2Vasc2 etc:1}  {Document your independent review of radiology images, and any outside records:1} {Document your discussion with family members, caretakers, and with consultants:1} {Document social determinants of health affecting pt's care:1} {Document your decision making why or why not admission, treatments were needed:1} Final Clinical Impression(s) / ED Diagnoses Final diagnoses:  None    Rx / DC Orders ED Discharge Orders     None

## 2022-12-22 NOTE — ED Provider Notes (Incomplete)
[place] EMERGENCY DEPARTMENT AT [facility] Provider Note   CSN: [id number] Arrival date & time: 12/22/22  1929     History {Add pertinent medical, surgical, social history, OB history to HPI:1} Chief Complaint  Patient presents with  . Dizziness    Angelica Brown Caller is a 47 y.o. female.  Pt with c/o intermittent frontal headache in past week. Dull, comes and goes. No specific or consistent exacerbation or alleviating factors. No change w head position or time of day. No photophobia or phonophobia. No eye pain, redness or change in vision. No sinus pain or drainage. No cough or sore throat. Indicates had covid ~ 1 month ago, but has felt improved from that standpoint. No recent head trauma. No syncope. No neck pain or stiffness. No new focal or one-sided numbness or weakness. No change in speech or vision. No problems w balance, coordination or normal functional ability. No fever or chills. No vomiting. Indicates intermittent mild nausea, and has felt generally weak/fatigued. States compliant w home meds including blood thinner therapy. Denies abnormal bleeding/bruising.   The history is provided by the patient and medical records.  Dizziness Associated symptoms: headaches and nausea   Associated symptoms: no blood in stool, no chest pain, no shortness of breath, no vomiting and no weakness        Home Medications Prior to Admission medications   Medication Sig Start Date End Date Taking? Authorizing Provider  dabigatran (PRADAXA) 150 MG CAPS capsule Take 1 capsule (150 mg total) by mouth every 12 (twelve) hours. 12/13/22   Latrelle Dodrill, MD  metoprolol tartrate (LOPRESSOR) 25 MG tablet Take 0.5 tablets (12.5 mg total) by mouth 2 (two) times daily. 12/13/22   Latrelle Dodrill, MD  nitroGLYCERIN (NITROSTAT) 0.4 MG SL tablet Place 1 tablet (0.4 mg total) under the tongue every 5 (five) minutes x 3 doses as needed for chest pain. 10/26/21   Latrelle Dodrill,  MD  prasugrel (EFFIENT) 10 MG TABS tablet Take 1 tablet (10 mg total) by mouth daily. 12/13/22   Latrelle Dodrill, MD  rosuvastatin (CRESTOR) 40 MG tablet Take 1 tablet (40 mg total) by mouth daily. 12/13/22   Latrelle Dodrill, MD  Semaglutide-Weight Management (WEGOVY) 1.7 MG/0.75ML SOAJ Inject 1.7 mg into the skin once a week. 12/13/22   Latrelle Dodrill, MD  topiramate (TOPAMAX) 50 MG tablet Take 1 tablet (50 mg total) by mouth daily. 12/13/22   Latrelle Dodrill, MD      Allergies    Ampicillin, Penicillins, Strawberry extract, Cephalexin, and Prednisone    Review of Systems   Review of Systems  Constitutional:  Negative for chills and fever.  HENT:  Negative for ear pain, rhinorrhea, sinus pressure, sinus pain and sore throat.   Eyes:  Negative for pain, discharge, redness and visual disturbance.  Respiratory:  Negative for cough and shortness of breath.   Cardiovascular:  Negative for chest pain.  Gastrointestinal:  Positive for nausea. Negative for abdominal pain, blood in stool and vomiting.  Genitourinary:  Negative for dysuria, flank pain and vaginal bleeding.  Musculoskeletal:  Negative for neck pain and neck stiffness.  Skin:  Negative for rash.  Neurological:  Positive for dizziness and headaches. Negative for syncope, speech difficulty, weakness and numbness.  Psychiatric/Behavioral:  Negative for confusion.     Physical Exam Updated Vital Signs BP 132/88 (BP Location: Right Arm)   Pulse (!) 52   Temp 98.3 F (36.8 C)   Resp 16  Ht 1.753 m (5\' 9" )   Wt 128.8 kg   LMP 12/16/2022   SpO2 98%   BMI 41.94 kg/m  Physical Exam Vitals and nursing note reviewed.  Constitutional:      Appearance: Normal appearance. She is well-developed.  HENT:     Head: Atraumatic.     Comments: No sinus or temporal pain, pressure or tenderness.    Nose: Nose normal. No congestion or rhinorrhea.     Mouth/Throat:     Mouth: Mucous membranes are moist.  Eyes:      General: No scleral icterus.    Extraocular Movements: Extraocular movements intact.     Conjunctiva/sclera: Conjunctivae normal.     Pupils: Pupils are equal, round, and reactive to light.     Comments: Sharp discs, no PE noted.   Neck:     Vascular: No carotid bruit.     Trachea: No tracheal deviation.     Comments: Supple, no stiffness or rigidity.  Cardiovascular:     Rate and Rhythm: Normal rate and regular rhythm.     Pulses: Normal pulses.     Heart sounds: Normal heart sounds. No murmur heard.    No friction rub. No gallop.  Pulmonary:     Effort: Pulmonary effort is normal. No respiratory distress.     Breath sounds: Normal breath sounds.  Abdominal:     General: There is no distension.     Palpations: Abdomen is soft.     Tenderness: There is no abdominal tenderness.  Musculoskeletal:        General: No swelling or tenderness.     Cervical back: Normal range of motion and neck supple. No rigidity. No muscular tenderness.     Right lower leg: No edema.     Left lower leg: No edema.  Skin:    General: Skin is warm and dry.     Findings: No rash.  Neurological:     General: No focal deficit present.     Mental Status: She is alert and oriented to person, place, and time.     Cranial Nerves: No cranial nerve deficit.     Comments: Alert, speech normal. Motor/sens grossly intact bil. Stre 5/5. Steady gait.   Psychiatric:        Mood and Affect: Mood normal.     ED Results / Procedures / Treatments   Labs (all labs ordered are listed, but only abnormal results are displayed) Results for orders placed or performed during the hospital encounter of 12/22/22  Basic metabolic panel  Result Value Ref Range   Sodium 140 135 - 145 mmol/L   Potassium 3.6 3.5 - 5.1 mmol/L   Chloride 105 98 - 111 mmol/L   CO2 29 22 - 32 mmol/L   Glucose, Bld 81 70 - 99 mg/dL   BUN 9 6 - 20 mg/dL   Creatinine, Ser 8.65 0.44 - 1.00 mg/dL   Calcium 9.1 8.9 - 78.4 mg/dL   GFR, Estimated >69  >62 mL/min   Anion gap 6 5 - 15  CBC  Result Value Ref Range   WBC 11.8 (H) 4.0 - 10.5 K/uL   RBC 4.55 3.87 - 5.11 MIL/uL   Hemoglobin 13.5 12.0 - 15.0 g/dL   HCT 95.2 84.1 - 32.4 %   MCV 82.9 80.0 - 100.0 fL   MCH 29.7 26.0 - 34.0 pg   MCHC 35.8 30.0 - 36.0 g/dL   RDW 40.1 02.7 - 25.3 %   Platelets 279 150 -  400 K/uL   nRBC 0.0 0.0 - 0.2 %      EKG None  Radiology CT Head Wo Contrast  Result Date: 12/22/2022 CLINICAL DATA:  Headache, no red flags Pt c/o headache, nausea, popping in ears, dizziness x 4 days. EXAM: CT HEAD WITHOUT CONTRAST TECHNIQUE: Contiguous axial images were obtained from the base of the skull through the vertex without intravenous contrast. RADIATION DOSE REDUCTION: This exam was performed according to the departmental dose-optimization program which includes automated exposure control, adjustment of the mA and/or kV according to patient size and/or use of iterative reconstruction technique. COMPARISON:  MRI head 01/08/2021 FINDINGS: Brain: No evidence of large-territorial acute infarction. No parenchymal hemorrhage. No mass lesion. No extra-axial collection. No mass effect or midline shift. No hydrocephalus. Basilar cisterns are patent. Partial empty sella. Vascular: No hyperdense vessel. Skull: No acute fracture or focal lesion. Sinuses/Orbits: Paranasal sinuses and mastoid air cells are clear. The orbits are unremarkable. Other: None. IMPRESSION: 1. No acute intracranial abnormality. 2. Partial empty sella. Findings is often a normal anatomic variant but can be associated with idiopathic intracranial hypertension (pseudotumor cerebri). Electronically Signed   By: Tish Frederickson M.D.   On: 12/22/2022 22:33    Procedures Procedures  {Document cardiac monitor, telemetry assessment procedure when appropriate:1}  Medications Ordered in ED Medications  acetaminophen (TYLENOL) tablet 1,000 mg (1,000 mg Oral Given 12/22/22 2159)    ED Course/ Medical Decision Making/  A&P   {   Click here for ABCD2, HEART and other calculatorsREFRESH Note before signing :1}                              Medical Decision Making Amount and/or Complexity of Data Reviewed Labs: ordered. Radiology: ordered.  Risk OTC drugs. Prescription drug management.   Iv ns. Continuous pulse ox and cardiac monitoring. Labs ordered/sent. Imaging ordered.   Differential diagnosis includes migraine, sinusitis, sdh, anemia, dehydration, etc. Dispo decision including potential need for admission considered - will get labs and imaging and reassess.   Reviewed nursing notes and prior charts for additional history. External reports reviewed.   Cardiac monitor: sinus rhythm, rate 66.  Labs reviewed/interpreted by me - wbc 11. Hgb normal. Chem normal.   CT reviewed/interpreted by me - no hem. ?partial empty sella.   Ultram po (pt has ride, does not have to drive).   Recheck resting comfortably, no distress. No visual c/o. No new numbness/weakness. No fevers.   Pt currently appears stable for d/c.  Discuss imaging, and plan for close outpatient neurology follow up, r/o IIH, etc.   Rec close pcp and neurology f/u.  Return precautions provided.    {Document critical care time when appropriate:1} {Document review of labs and clinical decision tools ie heart score, Chads2Vasc2 etc:1}  {Document your independent review of radiology images, and any outside records:1} {Document your discussion with family members, caretakers, and with consultants:1} {Document social determinants of health affecting pt's care:1} {Document your decision making why or why not admission, treatments were needed:1} Final Clinical Impression(s) / ED Diagnoses Final diagnoses:  None    Rx / DC Orders ED Discharge Orders     None

## 2022-12-22 NOTE — ED Notes (Signed)
ED Provider at bedside. 

## 2022-12-22 NOTE — ED Notes (Signed)
Reports HA, unrelieved by PO Tylenol & Aleve, denies V/D.

## 2022-12-23 MED ORDER — TRAMADOL HCL 50 MG PO TABS
50.0000 mg | ORAL_TABLET | Freq: Four times a day (QID) | ORAL | 0 refills | Status: DC | PRN
Start: 2022-12-23 — End: 2023-01-13

## 2022-12-23 NOTE — Discharge Instructions (Addendum)
It was our pleasure to provide your ER care today - we hope that you feel better.  Drink plenty of fluids/stay well hydrated.   Take acetaminophen as need. You may also take ultram as need, no driving for the next 6 hours or if/when taking ultram.   Your CT scan was read as showing no acute process. Incidental note was made of 'partially empty sella' - this can be a normal anatomic appearance,and it can also be seen in association with IIH (idiopathic intracranial hypertension) - follow up closely with neurologist in the coming week - see made a referral there for you - call office later this morning to arrange appointment.   Return to [facility] ER if worse, new symptoms, acute worsening or severe or intractable headache, change in vision, new numbness/weakness, persistent vomiting, fevers, or other concern.

## 2022-12-23 NOTE — ED Notes (Signed)
 RN reviewed discharge instructions with pt. Pt verbalized understanding and had no further questions. VSS upon discharge.  

## 2022-12-24 ENCOUNTER — Ambulatory Visit: Payer: Federal, State, Local not specified - PPO

## 2023-01-13 ENCOUNTER — Other Ambulatory Visit: Payer: Self-pay

## 2023-01-13 ENCOUNTER — Ambulatory Visit (INDEPENDENT_AMBULATORY_CARE_PROVIDER_SITE_OTHER): Payer: Federal, State, Local not specified - PPO | Admitting: Family Medicine

## 2023-01-13 ENCOUNTER — Encounter: Payer: Self-pay | Admitting: Family Medicine

## 2023-01-13 DIAGNOSIS — I252 Old myocardial infarction: Secondary | ICD-10-CM

## 2023-01-13 DIAGNOSIS — R519 Headache, unspecified: Secondary | ICD-10-CM | POA: Diagnosis not present

## 2023-01-13 DIAGNOSIS — Z23 Encounter for immunization: Secondary | ICD-10-CM

## 2023-01-13 DIAGNOSIS — Z0000 Encounter for general adult medical examination without abnormal findings: Secondary | ICD-10-CM | POA: Diagnosis not present

## 2023-01-13 MED ORDER — WEGOVY 1 MG/0.5ML ~~LOC~~ SOAJ: 1.0000 mg | SUBCUTANEOUS | 0 refills | Status: DC

## 2023-01-13 NOTE — Progress Notes (Signed)
  Date of Visit: 01/13/2023   SUBJECTIVE:   HPI:  Angelica Brown presents today for routine follow up.  Headache - went to ER recently due to headache, was concerned it was similar to when she previously had dural sinus thrombosis. CT was negative, showed partial ongoing empty sella. Patient was concerned and scheduled an appointment with a neurologist at [facility]; has appointment next week. Has had some ongoing headache, which is worse with sitting up in the AM.  Still needs referral to cardiologist since hers retired  Obesity: Last visit I sent in Wegovy 1.7 mg weekly.  Pharmacy did not have that when she went to pick up her medications.  It is unclear why they did not fill that for her.  She has been out of her Reginal Lutes entirely for about 3 weeks.  Previously was on the 1 mg weekly dose.  OBJECTIVE:   BP 137/87   Pulse (!) 56   Ht 5\' 9"  (1.753 m)   Wt 290 lb 12.8 oz (131.9 kg)   LMP 12/16/2022   SpO2 98%   BMI 42.94 kg/m  Gen: no acute distress, pleasant cooperative HEENT: normocephalic, atraumatic  Heart: regular rate and rhythm, no murmur Lungs: Clear to auscultation bilaterally, normal effort Neuro: Grossly nonfocal, speech normal  ASSESSMENT/PLAN:   Assessment & Plan History of MI (myocardial infarction) Referral placed to cardiology since her cardiologist retired. Continue present cardiac medications. Routine adult health maintenance Flu shot given today OBESITY, MORBID Unclear why she never got 1.7mg  dose of wegovy. Will resume back at 1mg , and in 4 weeks plan to go up to 1.7mg . advised she can msg me in 3-4 weeks and I will send in higher dose. 1mg  dose sent back in for her. Acute nonintractable headache, unspecified headache type I am glad she is seeing neuro given her complex history. With headache that is positional and partial empty sella, question whether patient could have idiopathic intracranial hypertension. Defer decisions about LP for opening pressure to neuro, will  note though that patient is on anticoagulant and antiplatelet and it may be challenging to get LP in that setting. She is at extremely high risk for recurrent thrombosis if either are stopped.    FOLLOW UP: Follow up in 3 months for obesity, sooner if needed  [name], MD [facility]

## 2023-01-13 NOTE — Patient Instructions (Signed)
It was great to see you again today.  Flu shot today Referral placed to cardiology Sent in ozempic 1mg  weekly - let me know if there are issues picking it up. In 3 weeks send me a msg to send in the higher dose of 1.7mg  weekly.  Follow up in 3 months, sooner if needed  Be well, Dr. Pollie Meyer

## 2023-01-15 DIAGNOSIS — Z0000 Encounter for general adult medical examination without abnormal findings: Secondary | ICD-10-CM | POA: Insufficient documentation

## 2023-01-15 NOTE — Assessment & Plan Note (Signed)
Flu shot given today

## 2023-01-15 NOTE — Assessment & Plan Note (Signed)
I am glad she is seeing neuro given her complex history. With headache that is positional and partial empty sella, question whether patient could have idiopathic intracranial hypertension. Defer decisions about LP for opening pressure to neuro, will note though that patient is on anticoagulant and antiplatelet and it may be challenging to get LP in that setting. She is at extremely high risk for recurrent thrombosis if either are stopped.

## 2023-01-15 NOTE — Assessment & Plan Note (Signed)
Referral placed to cardiology since her cardiologist retired. Continue present cardiac medications.

## 2023-01-15 NOTE — Assessment & Plan Note (Signed)
Unclear why she never got 1.7mg  dose of wegovy. Will resume back at 1mg , and in 4 weeks plan to go up to 1.7mg . advised she can msg me in 3-4 weeks and I will send in higher dose. 1mg  dose sent back in for her.

## 2023-01-17 ENCOUNTER — Other Ambulatory Visit: Payer: Self-pay | Admitting: Family Medicine

## 2023-01-17 ENCOUNTER — Telehealth: Payer: Self-pay

## 2023-01-17 NOTE — Telephone Encounter (Signed)
Pharmacy Patient Advocate Encounter   Received notification from CoverMyMeds that prior authorization for [facility]  is required/requested.   PA required; PA submitted to [facility] via CoverMyMeds Key/confirmation #/EOC [id number]. Status is pending

## 2023-01-17 NOTE — Telephone Encounter (Signed)
Pharmacy Patient Advocate Encounter  Received notification from [facility] FEP  that Prior Authorization for [facility] has been APPROVED from 12/18/22 to 01/17/24

## 2023-01-20 ENCOUNTER — Telehealth: Payer: Self-pay | Admitting: Cardiology

## 2023-01-20 ENCOUNTER — Encounter: Payer: Self-pay | Admitting: Interventional Cardiology

## 2023-01-20 NOTE — Telephone Encounter (Signed)
Patient is a past patient of Dr. Katrinka Blazing and has started a new job. She states the job is needing to know per last office visit, if she was put on any restrictions. If so, she would need to bring forms to be filled out.   Please advise.

## 2023-01-20 NOTE — Telephone Encounter (Signed)
Error

## 2023-01-20 NOTE — Telephone Encounter (Signed)
Left message on pt's voicemail - last time she was seen her was 2022 by Dr Katrinka Blazing.  Unsure how beneficial it would be for this office to fill out any paperwork regarding current medical status since it has been this long.  Advised she can drop paperwork that she has off and we can try it or complete if when Dr Anne Fu sees her in November.  Requested she call back if any questions or concerns.

## 2023-01-25 NOTE — Telephone Encounter (Signed)
Pt has not returned the call nor dropped off any paperwork for completion.  Will close this encounter for now and await a return call.

## 2023-02-12 ENCOUNTER — Other Ambulatory Visit: Payer: Self-pay | Admitting: Family Medicine

## 2023-02-15 ENCOUNTER — Other Ambulatory Visit: Payer: Self-pay | Admitting: Family Medicine

## 2023-02-15 ENCOUNTER — Ambulatory Visit: Payer: Federal, State, Local not specified - PPO | Admitting: Family Medicine

## 2023-02-15 ENCOUNTER — Encounter: Payer: Self-pay | Admitting: Family Medicine

## 2023-02-15 VITALS — BP 128/87 | HR 64 | Ht 69.0 in | Wt 292.0 lb

## 2023-02-15 DIAGNOSIS — I252 Old myocardial infarction: Secondary | ICD-10-CM | POA: Diagnosis not present

## 2023-02-15 DIAGNOSIS — I2699 Other pulmonary embolism without acute cor pulmonale: Secondary | ICD-10-CM

## 2023-02-15 NOTE — Assessment & Plan Note (Signed)
Patient medications are optimized her cardiac history.  She is currently able to do 4 METS of activity without any cardiac symptoms.  Her exam is reassuring today.  Patient's risk is minimized for mild exertion at work.  Paperwork filled out and provided letter.  Counseled on keeping cardiology appointment for November 18.

## 2023-02-15 NOTE — Patient Instructions (Signed)
It was great to see you! Thank you for allowing me to participate in your care!  Our plans for today:  - I have provided the paperwork you need today. Please let me know if you need anything else.   Please arrive 15 minutes PRIOR to your next scheduled appointment time! If you do not, this affects OTHER patients' care.  Take care and seek immediate care sooner if you develop any concerns.   Celine Mans, MD, PGY-2 [facility] 11:45 AM 02/15/2023  [facility]

## 2023-02-15 NOTE — Progress Notes (Signed)
    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 Needs work form that says she has no lifting restriction. Goes to gym regularly. Has no chest pain or SOB with exertion. Patient brings work paperwork requesting cardiac clearance. Cannot see Cardiology until the 18th of November. Reports still smoking 4-5 cigarettes per day.  Reports she is taking Pradaxa as prescribed.  As well as Lopressor.  Has not required use in at least a year.  Reports that her new job will require standing and mild lifting under 5 pounds with her arms.  PERTINENT  PMH / PSH: CHF, Hx of MI, OSA, GERD  OBJECTIVE:   BP 128/87   Pulse 64   Ht 5\' 9"  (1.753 m)   Wt 292 lb (132.5 kg)   SpO2 99%   BMI 43.12 kg/m   General: NAD  Neuro: A&O Cardiovascular: RRR, no murmurs, no peripheral edema Respiratory: normal WOB on RA, CTAB, no wheezes, ronchi or rales Extremities: Moving all 4 extremities equally   ASSESSMENT/PLAN:   Assessment & Plan History of MI (myocardial infarction) Patient medications are optimized her cardiac history.  She is currently able to do 4 METS of activity without any cardiac symptoms.  Her exam is reassuring today.  Patient's risk is minimized for mild exertion at work.  Paperwork filled out and provided letter.  Counseled on keeping cardiology appointment for November 18.  Return if symptoms worsen or fail to improve.  Celine Mans, MD [facility]

## 2023-03-01 NOTE — Telephone Encounter (Signed)
Patient is calling to check on status of referral. She said Dr. Pollie Meyer told her at her last appointment she would place a referral to [facility]. I looked under referral tab and did not see anything about this referral.    She would like to know if it could please be placed as soon as possible.

## 2023-03-03 ENCOUNTER — Ambulatory Visit: Payer: Federal, State, Local not specified - PPO | Admitting: Student

## 2023-03-03 ENCOUNTER — Emergency Department (HOSPITAL_COMMUNITY)
Admission: EM | Admit: 2023-03-03 | Discharge: 2023-03-03 | Disposition: A | Discharge status: Home / Self Care | Payer: Federal, State, Local not specified - PPO

## 2023-03-03 ENCOUNTER — Encounter: Payer: Self-pay | Admitting: Student

## 2023-03-03 ENCOUNTER — Encounter (HOSPITAL_COMMUNITY): Payer: Self-pay | Admitting: Emergency Medicine

## 2023-03-03 ENCOUNTER — Other Ambulatory Visit: Payer: Self-pay

## 2023-03-03 ENCOUNTER — Emergency Department (HOSPITAL_COMMUNITY): Payer: Federal, State, Local not specified - PPO

## 2023-03-03 VITALS — BP 136/84 | HR 66 | Ht 69.0 in | Wt 288.8 lb

## 2023-03-03 DIAGNOSIS — D72829 Elevated white blood cell count, unspecified: Secondary | ICD-10-CM | POA: Insufficient documentation

## 2023-03-03 DIAGNOSIS — R519 Headache, unspecified: Secondary | ICD-10-CM

## 2023-03-03 DIAGNOSIS — I5022 Chronic systolic (congestive) heart failure: Secondary | ICD-10-CM | POA: Diagnosis not present

## 2023-03-03 DIAGNOSIS — J3489 Other specified disorders of nose and nasal sinuses: Secondary | ICD-10-CM | POA: Diagnosis not present

## 2023-03-03 DIAGNOSIS — E236 Other disorders of pituitary gland: Secondary | ICD-10-CM | POA: Diagnosis not present

## 2023-03-03 LAB — CBC
HCT: 39.6 % (ref 36.0–46.0)
Hemoglobin: 14.2 g/dL (ref 12.0–15.0)
MCH: 29.5 pg (ref 26.0–34.0)
MCHC: 35.9 g/dL (ref 30.0–36.0)
MCV: 82.2 fL (ref 80.0–100.0)
Platelets: 331 10*3/uL (ref 150–400)
RBC: 4.82 MIL/uL (ref 3.87–5.11)
RDW: 13.6 % (ref 11.5–15.5)
WBC: 12.1 10*3/uL — ABNORMAL HIGH (ref 4.0–10.5)
nRBC: 0 % (ref 0.0–0.2)

## 2023-03-03 LAB — COMPREHENSIVE METABOLIC PANEL
ALT: 15 U/L (ref 0–44)
AST: 17 U/L (ref 15–41)
Albumin: 3.6 g/dL (ref 3.5–5.0)
Alkaline Phosphatase: 52 U/L (ref 38–126)
Anion gap: 8 (ref 5–15)
BUN: 8 mg/dL (ref 6–20)
CO2: 22 mmol/L (ref 22–32)
Calcium: 9.2 mg/dL (ref 8.9–10.3)
Chloride: 107 mmol/L (ref 98–111)
Creatinine, Ser: 0.6 mg/dL (ref 0.44–1.00)
GFR, Estimated: >60 mL/min (ref >60)
Glucose, Bld: 77 mg/dL (ref 70–99)
Potassium: 4 mmol/L (ref 3.5–5.1)
Sodium: 137 mmol/L (ref 135–145)
Total Bilirubin: 0.8 mg/dL (ref <1.2)
Total Protein: 7 g/dL (ref 6.5–8.1)

## 2023-03-03 LAB — MAGNESIUM: Magnesium: 2.1 mg/dL (ref 1.7–2.4)

## 2023-03-03 MED ORDER — DIPHENHYDRAMINE HCL 50 MG/ML IJ SOLN
25.0000 mg | Freq: Once | INTRAMUSCULAR | Status: AC
  Administered 2023-03-03: 25 mg via INTRAVENOUS
  Filled 2023-03-03: qty 1

## 2023-03-03 MED ORDER — PROCHLORPERAZINE EDISYLATE 10 MG/2ML IJ SOLN
10.0000 mg | Freq: Once | INTRAMUSCULAR | Status: AC
  Administered 2023-03-03: 10 mg via INTRAVENOUS
  Filled 2023-03-03: qty 2

## 2023-03-03 MED ORDER — GADOBUTROL 1 MMOL/ML IV SOLN
10.0000 mL | Freq: Once | INTRAVENOUS | Status: AC | PRN
  Administered 2023-03-03: 10 mL via INTRAVENOUS

## 2023-03-03 NOTE — Discharge Instructions (Addendum)
Call the number above in your discharge paperwork to schedule an appointment with the neurosurgeon for further testing.  Your symptoms return, worsen, or if you develop worsening vision changes weakness difficulty walking, incontinence of bowel or bladder, or any new or concerning symptoms return immediately to the emergency department.

## 2023-03-03 NOTE — Progress Notes (Addendum)
    SUBJECTIVE:   CHIEF COMPLAINT / HPI:   CT Head 12/22/22-partial empty sella, variant of IIH History of antiphospholipid syndrome and recurrent dural venous sinus thrombosis.  Currently on topiramate, prasugrel, dabigatran.  Vomited half the night last night--only time she had that was with her venous thrombosis Feels like pulsating vein in front of head---like someone knocking on head. Sharp pains radiating to back of head Hx of migraines with vertigo Ear hurting on right for years now 3 weeks worsening. No missed doses of antiplatelet/anticoagulant Blurry vision over last week No double vision Photophobia  Needs referral to endocrine doctor at [facility]---due to empty sella neurology does not see this  Has not taken nitroglycerin recently  PERTINENT  PMH / PSH: antiphospholipid syndrome, recurrent venous thrombosis, multiple MIs  OBJECTIVE:   BP 136/84   Pulse 66   Ht 5\' 9"  (1.753 m)   Wt 288 lb 12.8 oz (131 kg)   LMP 02/16/2023   SpO2 99%   BMI 42.65 kg/m   General: awake, alert, responsive to questions, mildly distressed Head: Normocephalic atraumatic, no pain with palpation over temporal artery, pupils equal and reactive, difficult to look at fundus fully CV: Regular rate and rhythm no murmurs rubs or gallops Respiratory: chest rises symmetrically,  no increased work of breathing Extremities: Moves upper and lower extremities freely,no gait changes Neuro: CN II: PERRL CN III, IV,VI: EOMI CV V: Normal sensation in V1, V2, V3 CVII: Symmetric smile and brow raise CN VIII: Normal hearing CN IX,X: Symmetric palate raise  CN XI: 5/5 shoulder shrug CN XII: Symmetric tongue protrusion  UE and LE strength 5/5 Normal sensation in UE and LE bilaterally  No ataxia with finger to nose  ASSESSMENT/PLAN:   Assessment & Plan Acute nonintractable headache, unspecified headache type Headache that has been ongoing for about 1 month but has been worsening over the past few weeks.   Over the last week has been having blurry vision.  Over the last day has been vomiting nonstop.  Recently got a CT scan in the ED but likely needs an MRV for evaluation given history of dural venous sinus thrombosis.  She is at high risk for lumbar fracture given anticoagulation that she is on for her antiphospholipid syndrome. -Patient sent to ED via EMS -Notified FMTS and ED of patient -[facility] referral placed for patient due to history of empty sella/likely IIH    Levin Erp, MD [facility]

## 2023-03-03 NOTE — ED Provider Notes (Signed)
[facility] Provider Note   CSN: [id number] Arrival date & time: 03/03/23  1056     History  Chief Complaint  Patient presents with   Headache    Angelica Brown is a 47 y.o. female with medical history of STEMI x 3, chronic systolic CHF, dural venous sinus thrombosis, antiphospholipid antibody syndrome, on anticoagulation, nystagmus.  Patient presents to the ED for evaluation of headache and neck pain.  The patient reports that she has a history of dural venous sinus thrombosis.  States that since the last week of August she has had a persistent headache that will resolve for a few days but will always return.  Currently she rates her headache at a 15 out of 10 and states that it is typically located in the front of her head.  She states that for the last 5 to 6 days this headache has acutely worsened.  She reports that she is having associated nausea and vomiting with this headache.  She is also states that she is having increased blurry vision, states that she does wear corrective lenses at baseline but this blurry vision is present even with her corrective lenses.  She denies any double vision, phonophobia, one-sided weakness, numbness, fevers, chest pain or shortness of breath.  States that she went to her PCPs office earlier today who indicated that she would need MR venogram and was sent to the ED for further management and workup.  She reports has a history of migraines with associated vertigo and states that she has been slightly dizzy in terms of her gait recently but denies feeling as if this is her vertigo that is contributing.  She states that the pain in her head is sharp and described as someone knocking on her head.  Reports that last night she was up all night with nausea and vomiting which is similar to her past episode of venous thrombosis however she reports that this headache does not feel similar to her past instance of  thrombosis.  On my initial exam the patient is in no apparent distress.  She is resting comfortably.  Alert and oriented x 4.     Headache Associated symptoms: dizziness, nausea and vomiting   Associated symptoms: no fever        Home Medications Prior to Admission medications   Medication Sig Start Date End Date Taking? Authorizing Provider  dabigatran (PRADAXA) 150 MG CAPS capsule TAKE 1 CAPSULE BY MOUTH EVERY 12 HOURS 02/15/23  Yes Latrelle Dodrill, MD  metoprolol tartrate (LOPRESSOR) 25 MG tablet Take 0.5 tablets (12.5 mg total) by mouth 2 (two) times daily. 12/13/22  Yes Latrelle Dodrill, MD  nitroGLYCERIN (NITROSTAT) 0.4 MG SL tablet Place 1 tablet (0.4 mg total) under the tongue every 5 (five) minutes x 3 doses as needed for chest pain. 10/26/21  Yes Latrelle Dodrill, MD  prasugrel (EFFIENT) 10 MG TABS tablet Take 1 tablet (10 mg total) by mouth daily. 12/13/22  Yes Latrelle Dodrill, MD  rosuvastatin (CRESTOR) 40 MG tablet Take 1 tablet (40 mg total) by mouth daily. 12/13/22  Yes Latrelle Dodrill, MD  topiramate (TOPAMAX) 50 MG tablet Take 1 tablet (50 mg total) by mouth daily. 12/13/22  Yes Latrelle Dodrill, MD  WEGOVY 1 MG/0.5ML SOAJ INJECT 1 MG  SUBCUTANEOUSLY ONCE A WEEK 02/15/23  Yes Latrelle Dodrill, MD      Allergies    Ampicillin, Penicillins, Strawberry extract, Cephalexin, and  Prednisone    Review of Systems   Review of Systems  Constitutional:  Negative for fever.  Eyes:  Positive for visual disturbance.  Gastrointestinal:  Positive for nausea and vomiting.  Neurological:  Positive for dizziness and headaches.  All other systems reviewed and are negative.   Physical Exam Updated Vital Signs BP 124/77   Pulse (!) 51   Temp 98.3 F (36.8 C)   Resp 16   LMP 02/16/2023   SpO2 100%  Physical Exam Vitals and nursing note reviewed.  Constitutional:      General: She is not in acute distress.    Appearance: Normal appearance. She is  not ill-appearing, toxic-appearing or diaphoretic.  HENT:     Head: Normocephalic and atraumatic.     Nose: Nose normal.     Mouth/Throat:     Mouth: Mucous membranes are moist.     Pharynx: Oropharynx is clear.  Eyes:     Extraocular Movements: Extraocular movements intact.     Conjunctiva/sclera: Conjunctivae normal.     Pupils: Pupils are equal, round, and reactive to light.  Cardiovascular:     Rate and Rhythm: Normal rate and regular rhythm.  Pulmonary:     Effort: Pulmonary effort is normal.     Breath sounds: Normal breath sounds. No wheezing.  Abdominal:     General: Abdomen is flat. Bowel sounds are normal.     Palpations: Abdomen is soft.     Tenderness: There is no abdominal tenderness.  Musculoskeletal:     Cervical back: Normal range of motion and neck supple. No tenderness.  Skin:    General: Skin is warm and dry.     Capillary Refill: Capillary refill takes less than 2 seconds.  Neurological:     General: No focal deficit present.     Mental Status: She is alert and oriented to person, place, and time.     GCS: GCS eye subscore is 4. GCS verbal subscore is 5. GCS motor subscore is 6.     Cranial Nerves: Cranial nerves 2-12 are intact. No cranial nerve deficit.     Sensory: Sensation is intact. No sensory deficit.     Motor: Motor function is intact. No weakness.     Coordination: Coordination is intact. Heel to [facility] Test normal.     ED Results / Procedures / Treatments   Labs (all labs ordered are listed, but only abnormal results are displayed) Labs Reviewed  CBC - Abnormal; Notable for the following components:      Result Value   WBC 12.1 (*)    All other components within normal limits  COMPREHENSIVE METABOLIC PANEL  MAGNESIUM    EKG None  Radiology No results found.  Procedures Procedures   Medications Ordered in ED Medications  prochlorperazine (COMPAZINE) injection 10 mg (10 mg Intravenous Given 03/03/23 1356)  diphenhydrAMINE  (BENADRYL) injection 25 mg (25 mg Intravenous Given 03/03/23 1356)    ED Course/ Medical Decision Making/ A&P Clinical Course as of 03/03/23 1553  Thu Mar 03, 2023  1531 DVST r/o. HA on/off since last week of August but acutely worsening x 6 days. Blurry vision, vomiting, vertigo. Hx migraines with vertigo. Sent by PCP for MRV. S/p migraine coctail and helped symptoms. F/u imaging. If positive heparin and admit [GD]    Clinical Course User Index [GD] Karmen Stabs, MD   Medical Decision Making Amount and/or Complexity of Data Reviewed Labs: ordered. Radiology: ordered.  Risk Prescription drug management.    47 year old  female with presents to ED for concern of dural venous sinus thrombosis.  Please see HPI for further details.  On exam patient neurological examination is at baseline without focal neurodeficits.  She is overall nontoxic in appearance.  She is afebrile and nontachycardic.  Her lung sounds are clear bilaterally and she is not hypoxic.  Abdomen soft and compressible.  Patient will require MR venogram.  Will provide her headache relief with Compazine and Benadryl.  CBC does show a leukocytosis.  Her metabolic panel and magnesium hemolyzed so these are being redrawn.  At the end of my shift, patient MR venogram completed.  Signed out to oncoming provider Dr. Nonnie Done resident.   Final Clinical Impression(s) / ED Diagnoses Final diagnoses:  Nonintractable headache, unspecified chronicity pattern, unspecified headache type    Rx / DC Orders ED Discharge Orders     None         Al Decant, PA-C 03/03/23 1553    Durwin Glaze, MD 03/03/23 [phone]

## 2023-03-03 NOTE — Addendum Note (Signed)
Addended by: Levin Erp on: 03/03/2023 10:33 AM   Modules accepted: Orders

## 2023-03-03 NOTE — Assessment & Plan Note (Addendum)
Headache that has been ongoing for about 1 month but has been worsening over the past few weeks.  Over the last week has been having blurry vision.  Over the last day has been vomiting nonstop.  Recently got a CT scan in the ED but likely needs an MRV for evaluation given history of dural venous sinus thrombosis.  She is at high risk for lumbar fracture given anticoagulation that she is on for her antiphospholipid syndrome. 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

## 2023-03-03 NOTE — ED Triage Notes (Addendum)
Pt sent from PCP with reports of headache, blurry vision, and vomiting. Pt also reports neck pain and unsteady gate. Pt has history of antiphospholipid syndrome and recurrent dural venous sinus thrombosis.  Sent for further workout of headache.

## 2023-03-03 NOTE — ED Provider Notes (Signed)
  Physical Exam  BP 124/77   Pulse (!) 51   Temp 98.3 F (36.8 C)   Resp 16   LMP 02/16/2023   SpO2 100%   Physical Exam  Procedures  Procedures  ED Course / MDM   Clinical Course as of 03/03/23 1532  Thu Mar 03, 2023  1531 DVST r/o. HA on/off since last week of August but acutely worsening x 6 days. Blurry vision, vomiting, vertigo. Hx migraines with vertigo. Sent by PCP for MRV. S/p migraine coctail and helped symptoms. F/u imaging. If positive heparin and admit [GD]    Clinical Course User Index [GD] Karmen Stabs, MD   Medical Decision Making Amount and/or Complexity of Data Reviewed Labs: ordered. Radiology: ordered.  Risk Prescription drug management.   Handoff received from Delice Bison, PA-C. Briefly this is a 47 y/o F with PMH CAD (STEMI x3), systolic CHF, dural venous sinus thrombosis, antiphospholipid andibody on prasugrel + dabigatran who is here for headache, neck pain, vertigo, similar to prior headaches but worse in severity alogn with N/V. Saw PCP today who recommended ED eval for MRV to r/o recurrent DVST. Has a hx of migraines with vertigo. Neuro exam reassuring. Labs and MRV are ordered. Plan is f/u labs, MRV, dispo approrpiately. She has gotten benadryl and compazine and is feelin better.   Labs show leukocytosis of 12.1, otherwise reassuring.  MRV independently reviewed by me and shows no dural venous sinus thrombosis though findings that could represent idiopathic intracranial hypertension.  With radiologist read.  On reassessment her symptoms have not completely resolved but are manageable and patient would like to discharge home.  I discussed her above workup.  At this time she has a normal neurologic exam.  Discussed further about possibility of IIH and patient states that at 1 point she was told she had an empty sella on a CAT scan and another provider during another headache evaluation mentioned IIH is a possibility.  I offered the patient admission  for further evaluation of this if she felt that her symptoms were not adequately controlled.  She however would like to go home and have this evaluation done outpatient which I feel is appropriate for her.  Offered her repeat medications prior to discharge which she declined.   Referral placed to neurosurgery for further workup and management of possible IIH.  Phone number for on-call provider was given to the patient in her discharge paperwork.  Strict return precautions were discussed.  She was discharged in stable condition without further event       Karmen Stabs, MD 03/03/23 2026    Jacalyn Lefevre, MD 03/03/23 [phone]

## 2023-03-03 NOTE — ED Notes (Signed)
Pt transported to MRI 

## 2023-03-03 NOTE — Patient Instructions (Signed)
It was great to see you! Thank you for allowing me to participate in your care!   Our plans for today:  - sending you to ED - placing referral for [facility]  Take care and seek immediate care sooner if you develop any concerns.  Levin Erp, MD

## 2023-03-04 ENCOUNTER — Telehealth: Payer: Self-pay | Admitting: Family Medicine

## 2023-03-04 ENCOUNTER — Encounter: Payer: Self-pay | Admitting: Family Medicine

## 2023-03-04 DIAGNOSIS — R519 Headache, unspecified: Secondary | ICD-10-CM

## 2023-03-04 MED ORDER — WEGOVY 1.7 MG/0.75ML ~~LOC~~ SOAJ: 1.7000 mg | SUBCUTANEOUS | 3 refills | Status: DC

## 2023-03-04 NOTE — Telephone Encounter (Signed)
I have no recollection of being asked to put in a referral to endocrinology, or why she would need to see endocrinology. Sent patient a message to further clarify.  Latrelle Dodrill, MD

## 2023-03-04 NOTE — Telephone Encounter (Signed)
Called and spoke with patient She is upset because ED physician yesterday placed referral to neurosurgery for eval of IIH but neurosurgery office want her to see neuro first. Advised that typically neuro does do most of the evaluation and management of possible IIH and NSG only gets involved if neuro deems they need to. I don't think she necessarily needs to see neurosurgery right now.  Patient states when she previously contacted [facility] they declined to see her for empty sella syndrome and said that had to go to [facility] endocrinology.  She has a referral pending to [facility] endocrine.  Advised I would place referral to [facility] for evaluation & management of possible IIH, hopefully they will agree to see her.  Patient appreciative of the call.  Latrelle Dodrill, MD

## 2023-03-04 NOTE — Telephone Encounter (Signed)
Patient called requesting a call back from Dr Pollie Meyer. Patient upset and states she has a lot going on right now.   She states she was referred to a neuro surgeon after her doctor appointment and ED visit on 11/14 and neuro is stating they need more medical records because patient states there is more going on than just a headache.   Please call patient back.

## 2023-03-04 NOTE — Telephone Encounter (Signed)
Nevermind - I see that she was seen by Dr. Laroy Apple yesterday and she placed the referral  Latrelle Dodrill, MD

## 2023-03-07 ENCOUNTER — Encounter: Payer: Self-pay | Admitting: Cardiology

## 2023-03-07 ENCOUNTER — Ambulatory Visit: Payer: Federal, State, Local not specified - PPO | Attending: Cardiology | Admitting: Cardiology

## 2023-03-07 VITALS — BP 122/74 | HR 75 | Ht 69.0 in | Wt 283.6 lb

## 2023-03-07 DIAGNOSIS — I252 Old myocardial infarction: Secondary | ICD-10-CM | POA: Diagnosis not present

## 2023-03-07 DIAGNOSIS — D6861 Antiphospholipid syndrome: Secondary | ICD-10-CM | POA: Diagnosis not present

## 2023-03-07 DIAGNOSIS — G08 Intracranial and intraspinal phlebitis and thrombophlebitis: Secondary | ICD-10-CM

## 2023-03-07 DIAGNOSIS — I2510 Atherosclerotic heart disease of native coronary artery without angina pectoris: Secondary | ICD-10-CM

## 2023-03-07 NOTE — Progress Notes (Signed)
Cardiology Office Note:  .   Date:  03/07/2023  ID:  Angelica Brown, DOB [birth year], MRN [id number] PCP: Latrelle Dodrill, MD  [facility] Providers Cardiologist:  Donato Schultz, MD     History of Present Illness: Marland Kitchen   Angelica Brown is a 47 y.o. female Discussed the use of AI scribe   History of Present Illness   The patient is a 47 year old female with a complex medical history including coronary artery disease with prior stenting of the RCA in July and September of 2019, antiphospholipid antibody syndrome, lupus anticoagulant, secondary thrombophilia, DVT, obesity, hypertension, and obstructive sleep apnea requiring BiPAP. She has experienced three heart attacks, the first two occurring in [place] and the third upon returning to [place]. 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  The patient is currently on Pradaxa (dabigatran) 150 mg twice a day, Effient 10 mg daily, and metoprolol 12.5 mg twice a day. She reports frequent palpitations but otherwise feels well. 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  The patient has made significant lifestyle changes, including a dramatic decrease in carbohydrate intake and a weight loss of 75 pounds. 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          Studies Reviewed: .        Results RADIOLOGY Bilateral sinus thrombosis:  Empty sella syndrome: diagnosed (12/2022)  DIAGNOSTIC Idiopathic intracranial hypertension: diagnosed with cerebrospinal fluid in the spine and brain (02/2023)   Risk Assessment/Calculations:            Physical Exam:   VS:  BP 122/74   Pulse 75   Ht 5\' 9"  (1.753 m)    Wt 283 lb 9.6 oz (128.6 kg)   LMP 02/16/2023   SpO2 95%   BMI 41.88 kg/m    Wt Readings from Last 3 Encounters:  03/07/23 283 lb 9.6 oz (128.6 kg)  03/03/23 288 lb 12.8 oz (131 kg)  02/15/23 292 lb (132.5 kg)    GEN: Well nourished, well developed in no acute distress NECK: No JVD; No carotid bruits CARDIAC: RRR, no murmurs, no rubs, no gallops RESPIRATORY:  Clear to auscultation without rales, wheezing or rhonchi  ABDOMEN: Soft, non-tender, non-distended EXTREMITIES:  No edema; No deformity   ASSESSMENT AND PLAN: .    Assessment and Plan    Coronary Artery Disease with Prior Stenting Coronary artery disease with prior stenting of the RCA in July 2019 (bare metal stent) and September 2019 (DES due to stent thrombosis). Recurrent stent thrombosis treated with angioplasty and reinstitution of Effient and Xarelto. Currently on Pradaxa 150 mg twice a day and Effient 10 mg daily. No recent myocardial infarctions. Emphasized the importance of continuing anticoagulation therapy to prevent further thrombotic events. - Continue Pradaxa 150 mg twice a day - Continue Effient 10 mg daily - Annual follow-up  Antiphospholipid Antibody Syndrome Antiphospholipid antibody syndrome with lupus anticoagulant and secondary thrombophilia. History of multiple thrombotic events including DVT and sinus thrombosis. Managed with anticoagulation therapy. Discussed the necessity of ongoing anticoagulation to manage clotting risks. - Continue current anticoagulation therapy with Pradaxa and Effient  Hypertension Hypertension, well-controlled with metoprolol 12.5 mg twice a day. - Continue  metoprolol 12.5 mg twice a day  Obesity Obesity with significant weight loss of 75 pounds following a Mediterranean diet with reduced carbohydrate intake. Encouraged continued efforts to maintain weight loss. - Continue Mediterranean diet with reduced carbohydrate intake - Encourage further weight loss and  maintenance  Obstructive Sleep Apnea Obstructive sleep apnea, managed with BiPAP therapy. No recent issues reported. - Continue BiPAP therapy  General Health Maintenance Tobacco use strongly recommended to discontinue cigarette smoking. Target LDL less than 70. - Encourage smoking cessation - Maintain target LDL less than 70  Follow-up - Annual follow-up.              Signed, Donato Schultz, MD

## 2023-03-07 NOTE — Patient Instructions (Signed)

## 2023-03-10 ENCOUNTER — Telehealth: Payer: Self-pay | Admitting: Family Medicine

## 2023-03-10 NOTE — Telephone Encounter (Signed)
Patient dropped off dental clearance form to be completed. Last DOS was 03/03/23. Placed in [employer].

## 2023-03-11 ENCOUNTER — Telehealth: Payer: Self-pay

## 2023-03-11 NOTE — Telephone Encounter (Signed)
Placed in MDs box to be filled out. Angelica Brown Bruna Potter, CMA

## 2023-03-11 NOTE — Telephone Encounter (Signed)
Patient calls nurse line requesting update on referral.   She reports she spoke with [facility], however they told her she would need to see Endocrinology first.   Will forward to referral coordinator for update on [facility] referral.

## 2023-03-15 ENCOUNTER — Telehealth: Payer: Self-pay | Admitting: Family Medicine

## 2023-03-15 NOTE — Telephone Encounter (Signed)
Patient calls regarding this and states she reached out to [facility] and they are still saying they have not received anything for this patient. Please advise.

## 2023-03-15 NOTE — Telephone Encounter (Signed)
Patient calls office very irritated and upset, requesting to speak to a Production designer, theatre/television/film. Told patient our manager is on maternity leave and said I would help as much as I can. Patient has called a few times regarding this and has spoke to a multiple people regarding this issue.   Patient seen at our office for headaches on 03/03/23 at which we sent her to ED to be evaluated. Patient says she was told at ED she needed to see neurology/neurosurgeon. She said she's been told she's been referred to [facility], but every time she calls they say they do not have any thing for her. She is very irritated and feels like nothing is being done for this and she feels it is very important. She would like for somebody in management to call her back regarding this.   Routing to Dr. Lum Babe and PCP.

## 2023-03-15 NOTE — Telephone Encounter (Signed)
Called and spoke with patient.  She was able to get an appointment with [facility] in March, but also needs a referral to [facility].  I placed a referral on 11/15 to [facility] but I don't see that it's been sent to them (I may be looking in the wrong place). Melvenia Beam, can you get this referral sent off ASAP?  Patient appreciative, says she does not need anyone higher up to call her back.  Thanks, Latrelle Dodrill, MD

## 2023-03-15 NOTE — Telephone Encounter (Signed)
I spoke with patient and she was able to get an appointment with [facility], but also needs a referral to [facility].  I placed a referral on 11/15 to [facility] but I don't see that it's been sent to them (I may be looking in the wrong place). Melvenia Beam, can you get this referral sent off ASAP?  Thanks! [place]

## 2023-03-15 NOTE — Telephone Encounter (Signed)
Form completed, returning to [facility] RN team Latrelle Dodrill, MD

## 2023-03-16 NOTE — Telephone Encounter (Signed)
Form faxed to provided number. Copy made and placed in batch scanning.   Talbot Grumbling, RN

## 2023-03-23 ENCOUNTER — Telehealth: Payer: Self-pay

## 2023-03-23 NOTE — Telephone Encounter (Signed)
Transition Care Management Follow-up Telephone Call Date of discharge and from where: 03/03/2023 [facility] How have you been since you were released from the hospital? Patient stated she her headache has not improved. She has an appointment with [facility]. Any questions or concerns? No  Items Reviewed: Did the pt receive and understand the discharge instructions provided? Yes  Medications obtained and verified? Yes  Other? No  Any new allergies since your discharge? No  Dietary orders reviewed? Yes Do you have support at home? Yes   Follow up appointments reviewed:  PCP Hospital f/u appt confirmed?  Patient called for Endocrinology referral on 03/04/2023  Scheduled to see  on  @ . Specialist Hospital f/u appt confirmed?  Patient is waiting for referral to [facility] to be processed and appointment scheduled  Scheduled to see  on  @ . Are transportation arrangements needed? No  If their condition worsens, is the pt aware to call PCP or go to the Emergency Dept.? Yes Was the patient provided with contact information for the PCP's office or ED? Yes Was to pt encouraged to call back with questions or concerns? Yes   Carlyon Nolasco [facility]  [facility] Guide Direct Dial: [phone]  Website: [email or web address]

## 2023-04-14 ENCOUNTER — Other Ambulatory Visit (HOSPITAL_COMMUNITY): Payer: Self-pay

## 2023-05-10 ENCOUNTER — Other Ambulatory Visit (HOSPITAL_COMMUNITY): Payer: Self-pay

## 2023-05-11 ENCOUNTER — Other Ambulatory Visit (HOSPITAL_COMMUNITY): Payer: Self-pay

## 2023-05-23 ENCOUNTER — Ambulatory Visit: Payer: Federal, State, Local not specified - PPO | Admitting: Family Medicine

## 2023-05-23 ENCOUNTER — Encounter: Payer: Self-pay | Admitting: Family Medicine

## 2023-05-23 DIAGNOSIS — Z1211 Encounter for screening for malignant neoplasm of colon: Secondary | ICD-10-CM | POA: Diagnosis not present

## 2023-05-23 DIAGNOSIS — Z0000 Encounter for general adult medical examination without abnormal findings: Secondary | ICD-10-CM | POA: Diagnosis not present

## 2023-05-23 DIAGNOSIS — G4733 Obstructive sleep apnea (adult) (pediatric): Secondary | ICD-10-CM | POA: Diagnosis not present

## 2023-05-23 NOTE — Patient Instructions (Addendum)
It was great to see you again today.  Talk with your insurance company to see for what conditions they will allow ozempic to be covered  Checking thyroid and A1c today  Ordered new sleep study, someone will call you about that Reordered cologuard  Be well, Dr. Pollie Meyer

## 2023-05-23 NOTE — Progress Notes (Unsigned)
  Date of Visit: 05/23/2023   SUBJECTIVE:   HPI:  [place] presents today for ***   OBJECTIVE:   BP 120/70   Pulse 66   Ht 5\' 9"  (1.753 m)   Wt 294 lb 9.6 oz (133.6 kg)   SpO2 99%   BMI 43.50 kg/m  Gen: *** HEENT: *** Heart: *** Lungs: *** Neuro: *** Ext: ***  ASSESSMENT/PLAN:   Assessment & Plan OSA (obstructive sleep apnea)  Screening for colon cancer  Morbid obesity (HCC)     FOLLOW UP: Follow up in *** for ***  [name], MD [facility]

## 2023-05-25 ENCOUNTER — Encounter: Payer: Self-pay | Admitting: Family Medicine

## 2023-05-25 LAB — TSH RFX ON ABNORMAL TO FREE T4: TSH: 0.889 u[IU]/mL (ref 0.450–4.500)

## 2023-06-13 ENCOUNTER — Ambulatory Visit: Payer: Federal, State, Local not specified - PPO | Admitting: Family Medicine

## 2023-06-16 ENCOUNTER — Ambulatory Visit (INDEPENDENT_AMBULATORY_CARE_PROVIDER_SITE_OTHER): Payer: Federal, State, Local not specified - PPO | Admitting: Family Medicine

## 2023-06-16 ENCOUNTER — Encounter: Payer: Self-pay | Admitting: Family Medicine

## 2023-06-16 DIAGNOSIS — R9089 Other abnormal findings on diagnostic imaging of central nervous system: Secondary | ICD-10-CM | POA: Diagnosis not present

## 2023-06-16 DIAGNOSIS — Z23 Encounter for immunization: Secondary | ICD-10-CM

## 2023-06-16 DIAGNOSIS — Z0000 Encounter for general adult medical examination without abnormal findings: Secondary | ICD-10-CM | POA: Diagnosis not present

## 2023-06-16 DIAGNOSIS — Z0289 Encounter for other administrative examinations: Secondary | ICD-10-CM

## 2023-06-16 MED ORDER — SEMAGLUTIDE(0.25 OR 0.5MG/DOS) 2 MG/1.5ML ~~LOC~~ SOPN: 0.2500 mg | PEN_INJECTOR | SUBCUTANEOUS | 0 refills | Status: DC

## 2023-06-16 NOTE — Telephone Encounter (Signed)
 PT came in today with Documentation to be filled out by Dr Anne Fu

## 2023-06-16 NOTE — Patient Instructions (Signed)
 It was great to see you again today.  Sent in ozempic to see if we can get it covered Referring to Dr. Sherryll Burger (ophthalmology) and Dr. Lucia Gaskins (neurology) Will complete work paperwork  Be well, Dr. Pollie Meyer

## 2023-06-16 NOTE — Progress Notes (Signed)
  Date of Visit: 06/16/2023   SUBJECTIVE:   HPI:  Angelica Brown presents today for follow-up.  Weight management: She spoke with her insurance and they will apparently cover Ozempic for certain medical conditions including cardiac issues.  Desires to try this again.  Work form: Needs a form completed so that she can continue working remotely.  She works for the [employer] and her office has recently been ordered to have everyone return to in person work.  This would cause her to be physically positioned very very closely to multiple other people, putting her at risk for infections.  Possible IIH: Had imaging findings back in the fall concerning for possible IIH.  Has not had a lumbar puncture to evaluate.  Had an appointment scheduled with [facility] for March, but she just found out that has been rescheduled to July.  Amenable to trying to see local neurologist.  Has not seen ophthalmology recently.  Has intermittent headaches, not worsening.  No vision changes.  OBJECTIVE:   BP 111/67   Pulse (!) 104   Ht 5\' 9"  (1.753 m)   Wt (!) 301 lb 12.8 oz (136.9 kg)   LMP 05/22/2023   SpO2 100%   BMI 44.57 kg/m  Gen: No acute distress, pleasant, cooperative, well-appearing HEENT: Normocephalic, atraumatic Heart: Regular rate and rhythm, no murmur Lungs: Clear bilaterally, normal effort Neuro: Grossly nonfocal, speech normal  ASSESSMENT/PLAN:   Assessment & Plan OBESITY, MORBID Would very much benefit from resumption of Ozempic.  Sent in 0.25 mg weekly, anticipate prior Auth will need to be completed but this will hopefully be covered due to her significant known cardiovascular disease. Routine adult health maintenance COVID booster given today Abnormal brain MRI Prior imaging concerning for possible IIH.  Referring to local neurology since do cannot get her in anytime soon.  Also referring back to ophthalmology for evaluation.  In the event she has papilledema, may warrant sooner evaluation and/or lumbar  puncture. Encounter for completion of form with patient Will complete work forms   [name], MD [facility]

## 2023-06-17 ENCOUNTER — Telehealth: Payer: Self-pay | Admitting: *Deleted

## 2023-06-17 NOTE — Telephone Encounter (Signed)
 Tester, Sol Blazing  ST   06/16/23 11:59 AM Note PT came in today with Documentation to be filled out by Dr Anne Fu      Received [facility] paperwork requesting medical diagnosis for Dr Anne Fu to complete and sign.  PW given to Dr Anne Fu.

## 2023-06-18 DIAGNOSIS — R9089 Other abnormal findings on diagnostic imaging of central nervous system: Secondary | ICD-10-CM | POA: Insufficient documentation

## 2023-06-18 NOTE — Addendum Note (Signed)
 Addended by: Latrelle Dodrill on: 06/18/2023 11:28 PM   Modules accepted: Orders

## 2023-06-18 NOTE — Assessment & Plan Note (Signed)
COVID booster given today

## 2023-06-18 NOTE — Assessment & Plan Note (Addendum)
 Would very much benefit from resumption of Ozempic.  Sent in 0.25 mg weekly, anticipate prior Auth will need to be completed but this will hopefully be covered due to her significant known cardiovascular disease.

## 2023-06-18 NOTE — Assessment & Plan Note (Signed)
 Prior imaging concerning for possible IIH.  Referring to local neurology since do cannot get her in anytime soon.  Also referring back to ophthalmology for evaluation.  In the event she has papilledema, may warrant sooner evaluation and/or lumbar puncture.

## 2023-06-20 ENCOUNTER — Telehealth: Payer: Self-pay

## 2023-06-20 NOTE — Telephone Encounter (Signed)
 Dr Anne Fu reviewed paperwork and requested pt's PCP complete the forms due to her request to work from home due to risk of contracting covid if having to work out in public.

## 2023-06-20 NOTE — Telephone Encounter (Signed)
 Pharmacy Patient Advocate Encounter   Received notification from CoverMyMeds that prior authorization for OZEMPIC 0.25MG  is required/requested.   Insurance verification completed.   The patient is insured through [facility] .  PA required; PA submitted to above mentioned insurance via CoverMyMeds Key/confirmation #/EOC [id number]. Status is pending   Submitted with dx of CHF

## 2023-06-20 NOTE — Telephone Encounter (Signed)
 Paperwork taken to medical records to be returned to patient.

## 2023-06-21 NOTE — Telephone Encounter (Signed)
 Pharmacy Patient Advocate Encounter  Received notification from [facility] FEP  that Prior Authorization for [facility] has been DENIED.  Full denial letter will be uploaded to the media tab. See denial reason below.  The information submitted did not meet the plan's criteria for medical necessity due to the following reason:  The use of this medication for any diagnosis other than type 2 diabetes mellitus (T2DM) is considered an experimental or investigational use for this drug or product. Experimental or investigational indications are those which further studies or clinical trials are necessary to determine the maximum tolerated dose, toxicity, safety, or efficacy as compared with the standard means of treatment.

## 2023-06-22 NOTE — Telephone Encounter (Signed)
 I left two messages on patient's phone regarding the paperwork/form left for Dr. Anne Fu.  I mailed the form back to the patient today and asked to to send a message through MyChart if she has any questions.

## 2023-06-23 ENCOUNTER — Telehealth: Payer: Self-pay | Admitting: Family Medicine

## 2023-06-23 ENCOUNTER — Other Ambulatory Visit (HOSPITAL_COMMUNITY): Payer: Self-pay

## 2023-06-23 NOTE — Telephone Encounter (Signed)
 Telephone Note Encounter  Patient brought in form during visit to be completed. Form type:  request for medical documentation/work form Patient wants faxed  and then to pick up a copy for herself Form completed. Placed in RN triage side wall pocket. Routing note to [facility] RN Pool.   Levert Feinstein, MD  [facility]

## 2023-06-23 NOTE — Telephone Encounter (Signed)
 Patient called, LVM and informed that forms are ready for pick up. Copy made and placed in batch scanning. Original placed at front desk for pick up.   Patient will need to sign ROI if she wants this paperwork to be faxed. Advised of this in VM.   Veronda Prude, RN

## 2023-06-24 ENCOUNTER — Other Ambulatory Visit: Payer: Self-pay | Admitting: Family Medicine

## 2023-06-27 MED ORDER — SEMAGLUTIDE(0.25 OR 0.5MG/DOS) 2 MG/1.5ML ~~LOC~~ SOPN: 0.2500 mg | PEN_INJECTOR | SUBCUTANEOUS | 1 refills | Status: DC

## 2023-07-08 ENCOUNTER — Telehealth: Payer: Self-pay

## 2023-07-08 NOTE — Telephone Encounter (Signed)
 Patient calls nurse line for the following reasons.   Patient reports that she contacted pharmacy regarding Ozempic. They advised that her insurance does not cover Ozempic or Wegovy. Insurance preferred agent is [place]. Ozempic appeal appears to be pending. She is requesting that prescription be sent to [facility] on [address] while we wait for insurance decision on appeal.  Patient is also requesting update on referral for neurology. Will forward to [place].   Veronda Prude, RN

## 2023-07-08 NOTE — Telephone Encounter (Signed)
 Patient calls nurse line in regards to forms.   Patient advised these are front waiting for pick up.   She asks we fax them for her. Advised she would need to fill out a ROI before we can fax.  Advised we did leave her a VM a few weeks ago detailing this.   She reports she will come by today to fill out ROI.

## 2023-07-12 ENCOUNTER — Telehealth: Payer: Self-pay | Admitting: Family Medicine

## 2023-07-12 MED ORDER — SAXENDA 18 MG/3ML ~~LOC~~ SOPN: PEN_INJECTOR | SUBCUTANEOUS | 0 refills | Status: DC

## 2023-07-12 NOTE — Telephone Encounter (Signed)
 See separate phone note, rx for saxenda sent in. Per patient, insurance will cover this. Ins will not cover ozempic as she does not have type 2 diabetes, and they do not cover wegovy for weight loss.  Latrelle Dodrill, MD

## 2023-07-12 NOTE — Telephone Encounter (Signed)
 Patient came in asking about the status of her weight loss medication. She states her insurance told her they would cover Saxenda, and wants to know if that can be prescribed instead. Please call with any updates.

## 2023-07-12 NOTE — Telephone Encounter (Signed)
 Called patient to clarify request She confirms that her ins will only cover ozempic if she has type 2 diabetes, which she does not have She says they will cover saxenda Rx sent in with ramp up instructions Patient appreciative  Latrelle Dodrill, MD

## 2023-07-14 ENCOUNTER — Telehealth: Payer: Self-pay

## 2023-07-14 ENCOUNTER — Other Ambulatory Visit (HOSPITAL_COMMUNITY): Payer: Self-pay

## 2023-07-14 NOTE — Telephone Encounter (Signed)
 Pharmacy Patient Advocate Encounter   Received notification from CoverMyMeds that prior authorization for [facility] is required/requested.   Insurance verification completed.   The patient is insured through [facility] .   PA required; PA submitted to above mentioned insurance via CoverMyMeds Key/confirmation #/EOC [facility]. Status is pending

## 2023-07-15 NOTE — Telephone Encounter (Signed)
 Pharmacy Patient Advocate Encounter  Received notification from [facility] FEP  that Prior Authorization for [facility] has been DENIED.  Full denial letter will be uploaded to the media tab. See denial reason below.  the use of this medication without a patient having participated in a comprehensive weight management program (e.g. [employer] or another weight loss program) does not establish medical necessity for this drug. Medical necessity is determined by adherence to generally accepted standards of medical practice in the [place], is clinically appropriate, in terms of type, frequency, extent, site, duration and considered effective for the patient's illness, injury, disease, or its symptoms.  Did she ever start with Healthy Weight [facility] Wellness?

## 2023-07-15 NOTE — Telephone Encounter (Signed)
 She did not start with healthy weight and wellness.  I as her primary care physician have been comprehensively managing her obesity medically since November 2022, when we first started ozempic.  Weight and GLP-1 agonist history: 03/03/21 - 355 lb, started ozempic 0.25mg  weekly 05/20/21 - 372 lb, increased ozempic to 0.5mg  weekly 07/16/21 - 342 lb, increased ozempic to 1mg  weekly 10/07/21 - 315 lb, continued ozempic 1mg  weekly 10/26/21 - 300 lb, continued ozempic 1mg  weekly 12/14/21 - 297 lb, increased ozempic to 2mg  weekly 03/22/22 - 279 lb, backed down to ozempic 1mg  weekly (possibly due to supply issues?) 06/07/22 - 289 lb, had changed to wegovy 1mg  due to insurance formulary switch 12/13/22 - 292 lb still on wegovy 1mg  weekly, increased wegovy to 1.7mg  weekly 01/13/23 - 290lb, never got 1.7mg  weekly of wegovy, continued on 1mg  weekly 05/23/23 - 294lb, had stopped wegovy as insurance stopped paying for it, attempted for prior auth 06/16/23 - 302lb, remained off GLP-1, attempted again to get authorized but denied  Throughout this time we have treated her with medications but also encouraged lifestyle modification (healthy eating and exercise). This comprehensive approach is evident in multiple progress notes over the years.  She continues to have significant cardiovascular disease (multiple prior MIs, OSA requiring bipap, antiphospholipid antibody syndrome) and previously demonstrated marked improvement in weight with GLP-1 therapy. Unfortunately GLP-1 therapies have been made inaccessible to her due to changes in her insurance's willingness to cover the medication. This medication is medically necessary to help sustain her weight loss and prevent worsening of her multiple severe comorbidities.  Latrelle Dodrill, MD

## 2023-07-18 ENCOUNTER — Telehealth: Payer: Self-pay | Admitting: Family Medicine

## 2023-07-18 NOTE — Telephone Encounter (Signed)
 Patient dropped off FMLA paperwork to be completed. Last DOS was 06/16/23. Placed in [employer].

## 2023-07-18 NOTE — Telephone Encounter (Signed)
 PA e-appeal submitted with notes from below.   Key [id number]

## 2023-07-19 NOTE — Telephone Encounter (Signed)
 Placed in MDs box to be filled out. Angelica Brown Bruna Potter, CMA

## 2023-07-19 NOTE — Telephone Encounter (Signed)
 Insurance faxed another denial letter after appeal submitted.   Per insurance: to further discuss the decision, physician can speak to a reviewer at [phone].

## 2023-07-21 ENCOUNTER — Ambulatory Visit: Admitting: Student

## 2023-07-21 VITALS — BP 115/70 | Temp 98.2°F | Ht 69.0 in | Wt 301.2 lb

## 2023-07-21 DIAGNOSIS — R21 Rash and other nonspecific skin eruption: Secondary | ICD-10-CM | POA: Diagnosis not present

## 2023-07-21 MED ORDER — DOXYCYCLINE HYCLATE 100 MG PO CAPS
100.0000 mg | ORAL_CAPSULE | Freq: Two times a day (BID) | ORAL | 0 refills | Status: DC
Start: 2023-07-21 — End: 2023-12-06

## 2023-07-21 NOTE — Assessment & Plan Note (Signed)
 Acute circular indurated painful rash started this morning. Discussed I do not think that there is a relationship between her left calf muscle cramping earlier this week and this rash.   Cellulitis, drug reaction, insect bite, tickborne disease considered.  The rash could be beginning of erythema migrans, would not expect it to be painful.  Would expect insect bite to be more itchy than painful.   Lack of systemic symptoms is reassuring.  Shared decision making used and decided to empirically treat with antibiotics versus watchful waiting. - Prescribed doxycycline for potential tick-borne illness and cellulitis.  - Advised monitoring for worsening symptoms or systemic symptoms such as fever.  - Instructed to avoid prolonged sun exposure while on doxycycline - Advised reporting new symptoms or lack of improvement.

## 2023-07-21 NOTE — Patient Instructions (Addendum)
 It was great to see you! Thank you for allowing me to participate in your care!  I recommend that you always bring your medications to each appointment as this makes it easy to ensure you are on the correct medications and helps [place] not miss when refills are needed.  Our plans for today:  - rash could be infectious versus a reaction to something - We decided to treat for infection with Doxycyline which has been sent to the pharmacy  - take with food to prevent stomach upset and avoid direct sun exposure while taking the medication    Take care and seek immediate care sooner if you develop any concerns.   Dr. Erick Alley, DO [facility]

## 2023-07-21 NOTE — Progress Notes (Signed)
    SUBJECTIVE:   CHIEF COMPLAINT / HPI:   The patient presents with sudden onset pain and rash of left anterior thigh.  She also describes having a charley horse (cramping of left calf muscle) 3 mornings ago which woke her from sleep and resolved within 5 minutes.  This morning, while at work she noticed a soreness in the thigh. Upon examination, the patient noticed a circular, warm, and tender rash on the left thigh. The patient has not been exposed to any new medications, has not been hiking or outdoors, and has not been around any animals. The patient works from home during the day and in an office at night.  She denies any fever or generally feeling unwell.  Feels like her normal self.  PERTINENT  PMH / PSH: Antiphospholipid antibody syndrome, Hx MI  OBJECTIVE:   BP 115/70   Temp 98.2 F (36.8 C)   Ht 5\' 9"  (1.753 m)   Wt (!) 301 lb 3.2 oz (136.6 kg)   SpO2 97%   BMI 44.48 kg/m    General: NAD, pleasant Cardiac: Well-perfused Respiratory: Breathing comfortably on room air Abdomen: Bowel sounds present, nontender, nondistended, no hepatosplenomegaly. Extremities: no tenderness, edema or erythema of BLEs aside from rash described below Skin: Well circumcised circular erythematous rash ~ 7 cm in diameter with circular indurated center ~ 4 cm in diameter.  Mildly warm to touch and exquisitely tender.  No drainage. Neuro: alert, no obvious focal deficits Psych: Normal affect and mood   ASSESSMENT/PLAN:   Rash Acute circular indurated painful rash started this morning. Discussed I do not think that there is a relationship between her left calf muscle cramping earlier this week and this rash.   Cellulitis, drug reaction, insect bite, tickborne disease considered.  The rash could be beginning of erythema migrans, would not expect it to be painful.  Would expect insect bite to be more itchy than painful.   Lack of systemic symptoms is reassuring.  Shared decision making used and  decided to empirically treat with antibiotics versus watchful waiting. - Prescribed doxycycline for potential tick-borne illness and cellulitis.  - Advised monitoring for worsening symptoms or systemic symptoms such as fever.  - Instructed to avoid prolonged sun exposure while on doxycycline - Advised reporting new symptoms or lack of improvement.      Dr. Erick Alley, DO [facility]    ,c

## 2023-07-26 ENCOUNTER — Encounter: Payer: Self-pay | Admitting: Family Medicine

## 2023-07-26 ENCOUNTER — Telehealth: Payer: Self-pay

## 2023-07-26 NOTE — Telephone Encounter (Signed)
 Called patient, discussed and handled issue. Completed peer to peer and got her rx approved. Patient appreciative  Latrelle Dodrill, MD

## 2023-07-26 NOTE — Telephone Encounter (Signed)
 Pt called asking who the office manager is and then asked if Dr Leveda Anna is still the Wellsite geologist. When I told her no she took a big sigh and said...have Dr Pollie Meyer give me a call.  I asked if I could help her she said no.The number to reach her is [phone].  Clemencia Course, CMA

## 2023-07-26 NOTE — Telephone Encounter (Addendum)
 We have been trying to get GLP-1 approved for months I have previously made phone calls about it and written many notes Multiple PAs and appeals.  Contacted patient. She was in fine spirits, just said one of the questions on recent PA that was submitted was incorrect. Called peer to peer # and got rx approved. Called patient & msged her to let her know about this.  Latrelle Dodrill, MD

## 2023-07-26 NOTE — Telephone Encounter (Signed)
 Patient calls nurse line very frustrated in regards to [place].   I explained we have done the original PA, which was denied. I then explained, it appeared the e- appeal was also submitted on behalf of the patient. It appears the next step is a peer to peer review with PCP and insurance company.   Patient stated, "that is not how it was supposed to be handled." Patient continued to raise her voice.  Unsure why patient was getting upset with me after multiple attempts to explain the process of PAs.   Sent to office managers VM, per patient request.

## 2023-07-29 NOTE — Telephone Encounter (Signed)
 Contacted patient to review FMLA paperwork & ensure I fill it out in a way that will fit her needs She just needs time protected to go to appts Completed paperwork, copy made and placed in batch scanning, original at front desk for her to pickup Patient appreciative  Latrelle Dodrill, MD

## 2023-08-11 LAB — COLOGUARD

## 2023-08-19 NOTE — Telephone Encounter (Signed)
 Patient calls nurse line in regards to Saxenda  prescription.   She reports even though the prescription was approved, her copay is still ~300 dollars.   She reports she has been in communication with [facility]. She reports she was told about a coupon card she could use.   I spoke with Aron Lard and was given coupon card link.   Will copy to patients mychart.

## 2023-09-13 ENCOUNTER — Ambulatory Visit (INDEPENDENT_AMBULATORY_CARE_PROVIDER_SITE_OTHER): Admitting: Student

## 2023-09-13 ENCOUNTER — Encounter: Payer: Self-pay | Admitting: Student

## 2023-09-13 ENCOUNTER — Telehealth: Payer: Self-pay | Admitting: Student

## 2023-09-13 VITALS — BP 136/82 | HR 60 | Ht 69.0 in | Wt 305.0 lb

## 2023-09-13 DIAGNOSIS — L723 Sebaceous cyst: Secondary | ICD-10-CM | POA: Diagnosis not present

## 2023-09-13 DIAGNOSIS — L089 Local infection of the skin and subcutaneous tissue, unspecified: Secondary | ICD-10-CM | POA: Diagnosis not present

## 2023-09-13 MED ORDER — SAXENDA 18 MG/3ML ~~LOC~~ SOPN: PEN_INJECTOR | SUBCUTANEOUS | 0 refills | Status: DC

## 2023-09-13 NOTE — Patient Instructions (Signed)
 Angelica Brown, It is lovely to see you again. I think you had what's called a sebaceous cyst that got infected. We drained it to relieve the pressure. This should feel much better. Keep the gauze on there for 24 hours or so and then wash your hair normally tomorrow.  If you start having fevers or notice the area getting warm and tender, let me know and I'll get you some antibiotics, but I don't think you need them right now.   Alexa Andrews, MD

## 2023-09-13 NOTE — Telephone Encounter (Signed)
 Patient in for appointment today with another physician, mentioned to CMA she has not heard anything back about saxenda  coupon card.  Advised she should check her mychart for info on the coupon card. Patient mentioned needing new rx, sent in.  Angelica Cha, MD

## 2023-09-13 NOTE — Telephone Encounter (Signed)
 Returned after hours page. Patient reports that she has an appointment scheduled today at 10:50 AM. Nothing further needed over the phone.   Clem Currier, DO [facility], PGY-2 09/13/23 8:51 AM

## 2023-09-13 NOTE — Addendum Note (Signed)
 Addended by: Candee Cha on: 09/13/2023 11:25 AM   Modules accepted: Orders

## 2023-09-13 NOTE — Progress Notes (Unsigned)
 I saw and examined the patient and have discussed with the resident. I agree with the resident's note. I supervised and was present for the key portions of the procedure.

## 2023-09-14 NOTE — Progress Notes (Signed)
    SUBJECTIVE:   CHIEF COMPLAINT / HPI:   Tender Lump on Scalp Approximately two weeks ago, she noticed a lump on the back of her head, initially the size of a pea, which has since increased in size. The lump is very tender and painful, with pain radiating past her ear to the front of her face and the back of her neck.  She has been applying hot compresses and ice to the area, describing the pain as severe. No fever is present.  She wears wigs and sometimes braids.  OBJECTIVE:   BP 136/82   Pulse 60   Ht 5\' 9"  (1.753 m)   Wt (!) 305 lb (138.3 kg)   SpO2 90%   BMI 45.04 kg/m   Gen: Well appearing, in good spirits and NAD  HENT: Scalp with ~2 cm fluctuant mass over the left occiput. The area directly over the lesion is red, warm, and denuded of hair. See photo   ASSESSMENT/PLAN:   Assessment & Plan Infected sebaceous cyst Diagnosis: infected epidermal inclusion cyst - Location: Left occipital scalp Procedure: Incision & drainage Informed consent:  Discussed risks ( infection, pain, bleeding, bruising, numbness, and recurrence of the condition) and benefits of the procedure, as well as the alternatives.  Informed consent was obtained. Anesthesia: 1% Lidocaine  with Epinephrine, 2mL The area was prepared and draped in a standard fashion. The lesion drained pus and white, chalky, cyst material. A large amount of fluid was drained. Pieces of cyst wall were extracted. Antibiotic ointment and a sterile pressure dressing were applied. The patient tolerated the procedure well. The patient was instructed on post-op care. No antibiotics necessary at this time      Alexa Andrews, MD [facility]

## 2023-09-25 ENCOUNTER — Ambulatory Visit (HOSPITAL_BASED_OUTPATIENT_CLINIC_OR_DEPARTMENT_OTHER): Admitting: Internal Medicine

## 2023-10-03 ENCOUNTER — Telehealth: Payer: Self-pay

## 2023-10-03 NOTE — Telephone Encounter (Signed)
 Patient calls nurse line in regards to weight loss medication.   She reports the coupon card offered last month for Saxenda  is no longer available. She reports the copay for the medication without the coupon is ~ 300 dollars.   She reports she would like to try to obtain Wegovy  again. Per chart review, PA was approved through 01/17/2024, however patients insurance changed.   Will forward to PCP and [facility].

## 2023-10-04 ENCOUNTER — Other Ambulatory Visit (HOSPITAL_COMMUNITY): Payer: Self-pay

## 2023-10-04 MED ORDER — WEGOVY 0.25 MG/0.5ML ~~LOC~~ SOAJ: 0.2500 mg | SUBCUTANEOUS | 1 refills | Status: DC

## 2023-10-04 NOTE — Telephone Encounter (Signed)
 Rx sent in for wegovy  Will need to see if PA is needed  Thanks Candee Cha, MD

## 2023-10-04 NOTE — Telephone Encounter (Signed)
 Patients insurance no longer covers Wegovy  in 2025 (informed patient via mychart on 05/11/23).

## 2023-10-07 ENCOUNTER — Ambulatory Visit (HOSPITAL_BASED_OUTPATIENT_CLINIC_OR_DEPARTMENT_OTHER): Attending: Family Medicine | Admitting: Internal Medicine

## 2023-10-07 DIAGNOSIS — G4733 Obstructive sleep apnea (adult) (pediatric): Secondary | ICD-10-CM | POA: Insufficient documentation

## 2023-10-15 NOTE — Procedures (Signed)
 [facility]ryle Law [facility] [address] [place], [place] [address] Tel: [phone]   Fax: [phone]  Split Night Interpretation  Patient Name:  Nataya, Bastedo Date:  10/07/2023 Referring Physician:  Laymon Legions MD  Indications for Polysomnography The patient is a 48 year old Female who is 5' 9 and weighs 303.0 lbs.  Her BMI equals 44.9.  A diagnostic polysomnogram was performed to evaluate for -OSA.  After 142.0 minutes of sleep time the patient exhibited sufficient respiratory events qualifying her for a CPAP trial which was then initiated.    Medication  No Data.   Polysomnogram Data A full night polysomnogram was performed recording the standard physiologic parameters including EEG, EOG, EMG, EKG, nasal and oral airflow.  Respiratory parameters of chest and abdominal movements are recorded with Pe[place]-Crystal motion transducers.  Oxygen saturation was recorded by pulse oximetry.    Sleep Architecture The total recording time of the diagnostic portion of the study was 222.3 minutes.  The total sleep time was 142.0 minutes.  During the diagnostic portion of the study, the patient spent 1.8% of total sleep time in Stage N1, 71.8% in Stage N2, 20.4% in Stages N3, and 6.0% in REM.   Sleep latency was 53.3 minutes.  REM latency was 117.0 minutes.  Sleep Efficiency was 63.9%.  Wake after Sleep Onset time was 27.0 minutes.   At 01:36:32 AM the patient was placed on PAP treatment and was titrated at pressures ranging from 5* cm/H20 with supplemental oxygen at - up to 16* cm/H20 with supplemental oxygen at -.  The total recording time of the treatment portion of the study was 163.4 minutes.  The total sleep time was 149.5 minutes.  During the treatment portion of the study, the patient spent 2.7% of total sleep time in Stage N1, 83.3% in Stage N2, 0.0% in Stages N3, and 14.0% in REM.   Sleep latency was 0.0 minutes.  REM latency was 73.0  minutes.  Sleep Efficiency was 91.5%.  Wake after Sleep Onset time was 14.0 minutes.  Respiratory Events During the diagnostic portion of the study, the polysomnogram revealed a presence of - obstructive, - central, and 22 mixed apneas resulting in an Apnea index of 9.3 events per hour.  There were 30 hypopneas (>=3% desaturation and/or arousal) resulting in an Apnea\Hypopnea Index (AHI >=3% desaturation and/or arousal) of 22.0 events per hour.  There were 18 hypopneas (>=4% desaturation) resulting in an Apnea\Hypopnea Index (AHI >=4% desaturation) of 16.9 events per hour.  There were - Respiratory Effort Related Arousals resulting in a RERA index of - events per hour. The Respiratory Disturbance Index is 22.0 events per hour.  The snore index was - events per hour.  Mean oxygen saturation was 91.0%.  The lowest oxygen saturation during sleep was 77.0%.  Time spent <=88% oxygen saturation was 24.9 minutes (12.2%).  During the treatment portion of the study, the polysomnogram revealed a presence of - obstructive, - central, and - mixed apneas resulting in an Apnea index of - events per hour.  There were - hypopneas (>=3% desaturation and/or arousal) resulting in an Apnea\Hypopnea Index (AHI >=3% desaturation and/or arousal) of - events per hour.  There were - hypopneas (>=4% desaturation) resulting in an Apnea\Hypopnea Index (AHI >=4% desaturation) of - events per hour.  There were - Respiratory Effort Related Arousals resulting in a RERA index of - events per hour. The Respiratory Disturbance Index is - events per hour.  The  snore index was - events per hour.  Mean oxygen saturation was 92.7%.  The lowest oxygen saturation during sleep was 83.0%.  Time spent <=88% oxygen saturation was 0.2 minutes (0.1%).  Limb Activity During the diagnostic portion of the study, there were - limb movements recorded.  Of this total, - were classified as PLMs.  Of the PLMs, - were associated with arousals.  The Limb Movement  index was - per hour while the PLM index was - per hour.  During the treatment portion of the study, there were - limb movements recorded.  Of this total, - were classified as PLMs.  Of the PLMs, - were associated with arousals.  The Limb Movement index was - per hour while the PLM index was - per hour.  Cardiac Summary During the diagnostic portion of the study, the average pulse rate was 52.0 bpm.  The minimum pulse rate was 28.0 bpm while the maximum pulse rate was 136.0 bpm. PVCs.  During the treatment portion of the study, the average pulse rate was 52.6 bpm.  The minimum pulse rate was 32.0 bpm while the maximum pulse rate was 66.0 bpm.   Comment: Moderate obstructive sleep apnea, AHI (3%) 22/hr. Snoring with oxygen desaturation to a nadir of 72%, mean 91%.  CPAP titration to 16 cwp, residual AHI (3%) 0/hr, minimum O2 saturtion 91%, mean 91.6%.  Diagnosis: Obstructive sleep apnea  Recommendations: Autopap 10-20 or fixed CPAP 16. Patient wore a medium Simplus full-face mask with heated humidification.   This study was personally reviewed and electronically signed by: Reggy Salt MD Accredited Board Certified in Sleep Medicine Date/Time: 10/15/23  12:27   Split Night Report  Patient Name: Kinzi, Frediani Study Date: 10/07/2023  Date of Birth: 1975-04-26 Study Type: Split Night  Age: 5 year MRN #: [id number]  Sex: Female Interpreting Physician: SALT REGGY, [id number]  Height: 5' 9 Referring Physician: Laymon Legions MD  Weight: 303.0 lbs Recording Tech: Prentice Coombe RPSGT RST  BMI: 44.9 Scoring Tech: Prentice Coombe RPSGT RST  ESS: 18 Neck Size: 15  Mask Type Simplus Full Face Mask Final Pressure: 16cmH20  Mask Size: Medium Supplemental O2: N/A   Study Overview  DIAGNOSTIC TREATMENT  Lights Off: 09:54:04 PM Lights Off: 01:36:19 AM  Lights On: 01:36:19 AM Lights On: 04:19:42 AM  Time in Bed: 222.3 min. Time in Bed: 163.4 min.  Total Sleep Time: 142.0 min. Total Sleep  Time: 149.5 min.  Sleep Efficiency: 63.9% Sleep Efficiency: 91.5%  Sleep Latency: 53.3 min. Sleep Latency: 0.0 min.  REM Latency from Sleep Onset: 117.0 min. REM Latency from Sleep Onset: 73.0 min.  Wake After Sleep Onset: 27.0 min. Wake After Sleep Onset: 14.0 min.   DIAGNOSTIC TREATMENT   Count Index  Count Index  Awakenings: 6 2.5 Awakenings: 6 2.4  Arousals: 31 13.1 Arousals: - -  AHI (>=3% Desat and/or Ar.): 52 22.0 AHI (>=3% Desat and/or Ar.): - -  AHI (>=4% Desat): 40 16.9 AHI (>=4% Desat): - -   Limb Movements: - - Limb Movements: - -  Snore: - - Snore: - -  Desaturations: 38 16.1 Desaturations: - -  Minimum SpO2 TST: 77.0% Minimum SpO2 TST: 83.0%    Sleep Architecture   DIAGNOSTIC TREATMENT ENTIRE NIGHT  Stages Time (mins) % Sleep Time Time (mins) % Sleep Time Time (mins) % Sleep Time  Wake 80.5  14.0  94.5   Stage N1 2.5 1.8% 4.0 2.7% 6.5 2.2%  Stage N2 102.0 71.8% 124.5 83.3% 226.5 77.7%  Stage N3 29.0 20.4% 0.0 0.0% 29.0 9.9%  REM 8.5 6.0% 21.0 14.0% 29.5 10.1%   Arousal Summary   DIAGNOSTIC TREATMENT   NREM REM TST Index NREM REM TST Index  Respiratory Ar. 11 10 21  8.9 - - - -  PLM Ar. - - - - - - - -  Isolated Limb Movement Ar. - - - - - - - -  Snore Ar. - - - - - - - -  Spontaneous Ar. 10 - 10 4.2 - - - -  Total Ar. 21 10 31  13.1 - - - -    Respiratory Summary  DIAGNOSTIC By Sleep Stage By Body Position Total   NREM REM Supine Non-Supine   Time (min) 133.5 8.5 37.0 105.0 142.0         Obstructive Apnea - - - - -  Mixed Apnea 8 14 22  - 22  Central Apnea - - - - -  Total Apneas 8 14 22  - 22  Total Apnea Index 3.6 98.8 35.7 - 9.3         Hypopneas (>=3% Desat and/or Ar.) 30 - 1 29 30   AHI (>=3% Desat and/or Ar.) 17.1 98.8 37.3 16.6 22.0         Hypopneas (>=4% Desat) 18 - - 18 18  AHI (>=4% Desat) 11.7 98.8 35.7 10.3 16.9          RERAs - - - - -  RERA Index - - - - -         RDI 17.1 98.8 37.3 16.6 22.0    TREATMENT By Sleep Stage By Body  Position Total   NREM REM Supine Non-Supine   Time (min) 128.5 21.0 149.5 - 149.5         Obstructive Apnea - - - - -  Mixed Apnea - - - - -  Central Apnea - - - - -  Total Apneas - - - - -  Total Apnea Index - - - - -         Hypopneas (>=3% Desat and/or Ar.) - - - - -  AHI (>=3% Desat and/or Ar.) - - - - -         Hypopneas (>=4% Desat) - - - - -  AHI (>=4% Desat) - - - - -          RERAs - - - - -  RERA Index - - - - -         RDI - - - - -    Respiratory Event Durations   DIAGNOSTIC TREATMENT  Apnea NREM REM NREM REM  Average (seconds) 20.0 22.7 - -  Maximum (seconds) 22.3 29.5 - -  Hypopnea      Average (seconds) 30.1 - - -  Maximum (seconds) 47.4 - - -    Limb Movement Summary   DIAGNOSTIC TREATMENT   Count Index Count Index  Isolated Limb Movements - - - -  Periodic Limb Movements (PLMs) - - - -  Total Limb Movements - - - -    Oxygen Saturation Summary   DIAGNOSTIC TREATMENT   Wake NREM REM TST Wake NREM REM TST  Average SpO2 93.4% 89.8% 89.5% 89.8% 92.9% 92.8% 91.7% 92.7%  Minimum SpO2 68.0% 77.0% 83.0% 77.0%  77.0% 83.0% 90.0% 83.0%   Maximum SpO2 98.0% 98.0% 94.0% 98.0%  98.0% 99.0% 94.0% 99.0%    DIAGNOSTIC Oxygen Saturation Distribution  Range (%) Time in range (min) Time in range (%)  90.0 - 100.0 100.4 49.2%  80.0 - 90.0 102.9 50.4%  70.0 - 80.0 0.8 0.4%  60.0 - 70.0 0.1 0.0%  50.0 - 60.0 - -  0.0 - 50.0 - -  Time Spent <=88% SpO2  Range (%) Time in range (min) Time in range (%)  0.0 - 88.0 24.9 12.2%      Count Index  Desaturations: 38 16.1   TREATMENT Oxygen Saturation Distribution  Range (%) Time in range (min) Time in range (%)   90.0 - 100.0 154.2 96.3%  80.0 - 90.0 5.8 3.6%  70.0 - 80.0 0.2 0.1%  60.0 - 70.0 - -  50.0 - 60.0 - -  0.0 - 50.0 - -  Time Spent <=88% SpO2  Range (%) Time in range (min) Time in range (%)  0.0 - 88.0 0.2 0.1%      Count Index  Desaturations: - -     Cardiac Summary   DIAGNOSTIC  TREATMENT   Wake NREM REM Total Wake NREM REM Total  Average Pulse Rate (BPM) 53.1 52.6 35.1 52.0 53.0 51.7 57.3 52.6  Minimum Pulse Rate (BPM) 28.0 30.0 28.0 28.0 32.0 37.0 39.0 32.0  Maximum Pulse Rate (BPM) 70.0 136.0 50.0 136.0 66.0 64.0 65.0 66.0   Pulse Rate Distribution   DIAGNOSTIC  Range (bpm) Time in range (min) Time in range (%)  0.0 - 40.0 11.3 5.5%  40.0 - 60.0 188.1 91.3%  60.0 - 80.0 6.5 3.2%  80.0 - 100.0 0.0 0.0%  100.0 - 120.0 0.0 0.0%  120.0 - 140.0 0.1 0.0%  140.0 - 200.0 - -   TREATMENT  Range (bpm) Time in range (min) Time in range (%)  0.0 - 40.0 0.2 0.1%  40.0 - 60.0 156.6 98.0%  60.0 - 80.0 3.1 1.9%  80.0 - 100.0 - -  100.0 - 120.0 - -  120.0 - 140.0 - -  140.0 - 200.0 - -    Titration Summary  PAP Device PAP Level O2 Level Time (min) Wake (min) NREM (min) REM (min) Sleep Eff% OA# CA# MA# Hyp# (>=3%) AHI (>=3%) Hyp# (>=4%) AHI (>=%4) RERA RDI OSat <=88% (min) Min [employer] Ar. Index  - Off - 222.5 80.5 133.5 8.5 63.8% - - 22 30 22.0 18  16.9 -  22.0  22.7 77.0 89.8 13.1  CPAP 5 - 4.0 0.0 4.0 0.0 100.0% - - - - - -  - -  -  0.0 92.0 92.9 -  CPAP 7 - 14.5 4.5 10.0 0.0 69.0% - - - - - -  - -  -  0.0 83.0 95.1 -  CPAP 9 - 26.0 2.0 24.0 0.0 92.3% - - - - - -  - -  -  0.0 90.0 93.5 -  CPAP 11 - 17.0 1.0 16.0 0.0 94.1% - - - - - -  - -  -  0.0 90.0 91.4 -  CPAP 13 - 75.5 1.0 53.5 21.0 98.7% - - - - - -  - -  -  0.0 90.0 92.5 -  CPAP 14 - 12.5 0.5 12.0 0.0 96.0% - - - - - -  - -  -  0.0 92.0 92.3 -  CPAP 15 - 9.5 5.0 4.5 0.0 47.4% - - - - - -  - -  -  0.0 91.0 92.0 -  CPAP 16 - 4.5 0.0 4.5 0.0 100.0% - - - - - -  - -  -  0.0 91.0 91.6 -  Hypnograms                           Technologist Comments  STUDY TYPE= SPLIT STUDY MASK= SIMPLUS FULL FACE MASK PRESSURE= 16cmH20 SIZE= MEDIUM  Patient Present to the lab for Split Night study. Tech Explained the Process of Hook up and Wires. Patient Expressed Understanding. Hook  up was done accordingly.  Tech Observed Mild to Moderate sleep disorder Breathing. Split was initiated.  Pressure was started on 5cmH20. Pressure was increased in order of demand. Optimal Pressure was reached at 16cmH20. Patient slept left, right and supine. Abnormal ECG were seen through the Epochs. See 479.  Simplus Full Face Mask was used for this  titration.                        Reggy Salt Diplomate, Biomedical engineer of Sleep Medicine  ELECTRONICALLY SIGNED ON:  10/15/2023, 12:16 PM [facility] PH: [phone]   FX: [phone] ACCREDITED BY THE AMERICAN ACADEMY OF SLEEP MEDICINE

## 2023-10-23 DIAGNOSIS — D6862 Lupus anticoagulant syndrome: Secondary | ICD-10-CM | POA: Diagnosis not present

## 2023-10-23 DIAGNOSIS — Z955 Presence of coronary angioplasty implant and graft: Secondary | ICD-10-CM | POA: Diagnosis not present

## 2023-10-23 DIAGNOSIS — M25511 Pain in right shoulder: Secondary | ICD-10-CM | POA: Diagnosis not present

## 2023-10-23 DIAGNOSIS — F17210 Nicotine dependence, cigarettes, uncomplicated: Secondary | ICD-10-CM | POA: Diagnosis not present

## 2023-10-23 DIAGNOSIS — I252 Old myocardial infarction: Secondary | ICD-10-CM | POA: Diagnosis not present

## 2023-10-23 DIAGNOSIS — I2510 Atherosclerotic heart disease of native coronary artery without angina pectoris: Secondary | ICD-10-CM | POA: Diagnosis not present

## 2023-10-23 DIAGNOSIS — Z880 Allergy status to penicillin: Secondary | ICD-10-CM | POA: Diagnosis not present

## 2023-10-24 DIAGNOSIS — M542 Cervicalgia: Secondary | ICD-10-CM | POA: Diagnosis not present

## 2023-10-24 DIAGNOSIS — M7531 Calcific tendinitis of right shoulder: Secondary | ICD-10-CM | POA: Diagnosis not present

## 2023-10-24 DIAGNOSIS — M5030 Other cervical disc degeneration, unspecified cervical region: Secondary | ICD-10-CM | POA: Diagnosis not present

## 2023-10-24 DIAGNOSIS — M5412 Radiculopathy, cervical region: Secondary | ICD-10-CM | POA: Diagnosis not present

## 2023-11-07 ENCOUNTER — Encounter: Payer: Self-pay | Admitting: Family Medicine

## 2023-11-24 ENCOUNTER — Telehealth: Payer: Self-pay

## 2023-11-24 NOTE — Telephone Encounter (Addendum)
 Patient calls nurse line for (2) concerns.   (1) she requests a copy of her FMLA and work accommodations form. She asks I place a copy up front. This has been done.   (2) she requests another prescription for Saxenda  and a coupon card. Advised patient she can discuss this at 8/19 OV as we will need to start the process over with insurance.   Patient agreed with plan.

## 2023-12-06 ENCOUNTER — Ambulatory Visit (INDEPENDENT_AMBULATORY_CARE_PROVIDER_SITE_OTHER): Admitting: Family Medicine

## 2023-12-06 ENCOUNTER — Encounter: Payer: Self-pay | Admitting: Family Medicine

## 2023-12-06 DIAGNOSIS — H7091 Unspecified mastoiditis, right ear: Secondary | ICD-10-CM

## 2023-12-06 DIAGNOSIS — I252 Old myocardial infarction: Secondary | ICD-10-CM

## 2023-12-06 DIAGNOSIS — I2699 Other pulmonary embolism without acute cor pulmonale: Secondary | ICD-10-CM

## 2023-12-06 DIAGNOSIS — M25511 Pain in right shoulder: Secondary | ICD-10-CM

## 2023-12-06 DIAGNOSIS — D6861 Antiphospholipid syndrome: Secondary | ICD-10-CM

## 2023-12-06 DIAGNOSIS — G4733 Obstructive sleep apnea (adult) (pediatric): Secondary | ICD-10-CM | POA: Diagnosis not present

## 2023-12-06 DIAGNOSIS — R9089 Other abnormal findings on diagnostic imaging of central nervous system: Secondary | ICD-10-CM

## 2023-12-06 DIAGNOSIS — H02829 Cysts of unspecified eye, unspecified eyelid: Secondary | ICD-10-CM

## 2023-12-06 DIAGNOSIS — Z0000 Encounter for general adult medical examination without abnormal findings: Secondary | ICD-10-CM

## 2023-12-06 MED ORDER — PRASUGREL HCL 10 MG PO TABS: 10.0000 mg | ORAL_TABLET | Freq: Every day | ORAL | 1 refills | Status: AC

## 2023-12-06 MED ORDER — DABIGATRAN ETEXILATE MESYLATE 150 MG PO CAPS: 150.0000 mg | ORAL_CAPSULE | Freq: Two times a day (BID) | ORAL | 1 refills | Status: AC

## 2023-12-06 MED ORDER — TOPIRAMATE 50 MG PO TABS: 50.0000 mg | ORAL_TABLET | Freq: Every day | ORAL | 1 refills | Status: AC

## 2023-12-06 MED ORDER — ROSUVASTATIN CALCIUM 40 MG PO TABS: 40.0000 mg | ORAL_TABLET | Freq: Every day | ORAL | 1 refills | Status: AC

## 2023-12-06 MED ORDER — SAXENDA 18 MG/3ML ~~LOC~~ SOPN: 0.6000 mg | PEN_INJECTOR | Freq: Every day | SUBCUTANEOUS | 2 refills | Status: AC

## 2023-12-06 MED ORDER — METOPROLOL TARTRATE 25 MG PO TABS: 12.5000 mg | ORAL_TABLET | Freq: Two times a day (BID) | ORAL | 1 refills | Status: AC

## 2023-12-06 NOTE — Patient Instructions (Addendum)
 It was great to see you again today.  Come back with your CPAP machine next week  Reordering cologuard  Resending saxenda  medication   [facility] [address] [place], [place] [address] Local: [phone] Fax: [phone]  [facility] Address: [address], [place], [place] [address] Phone: [phone]  Follow up with orthopedist for your shoulder  Be well, Dr. Donah

## 2023-12-06 NOTE — Progress Notes (Signed)
 "  Date of Visit: 12/06/2023   SUBJECTIVE:   HPI:  Discussed the use of AI scribe software for clinical note transcription with the patient, who gave verbal consent to proceed.  History of Present Illness Angelica Brown is a 48 year old female with antiphospholipid antibody syndrome who presents for follow-up on multiple health issues including shoulder pain, eye cysts, and fatigue. She is accompanied by her mother.  Shoulder pain and palpable mass - Ongoing shoulder pain with a palpable lump approximately the size of a gumball - Pain improved since emergency room visit on October 23, 2023, after treatment with prednisone , Zofran , and pain medication. She followed up with ortho PA the following day. 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 - Cysts previously removed but have regrown - Cysts are persistent but not painful  Fatigue and sleep disturbance - Significant fatigue despite adequate rest - History of obstructive sleep apnea - Owns a BiPAP machine but has not used it in over two years - Works two jobs, contributing to fatigue  Obesity - Experiencing issues with Saxenda  prescription, including insurance coverage and coupon application problems   OBJECTIVE:   BP 130/88   Pulse 60   Ht 5' 9 (1.753 m)   Wt 297 lb (134.7 kg)   SpO2 100%   BMI 43.86 kg/m  Gen: no acute distress, pleasant, cooperative HEENT: normocephalic, atraumatic  Heart: regular rate and rhythm, no murmur Lungs: clear to auscultation bilaterally, normal work of breathing  Neuro: alert, grossly nonfocal, speech normal Ext: No appreciable lower extremity edema bilaterally. Full active range of motion of R shoulder.  ASSESSMENT/PLAN:   Assessment & Plan OBESITY, MORBID Continued management of morbid obesity. Insurance does not cover obesity medications, and  she plans to change her insurance plan during open enrollment. Previous prescription for Saxenda  expired, and coupon issues were noted. - Sent new prescription for Saxenda  - Advised her to revisit the website for the coupon History of MI (myocardial infarction) Refilled effient , encouraged follow up with cardiology OSA (obstructive sleep apnea) Obstructive sleep apnea confirmed by sleep study. She has a BiPAP machine but has not used it in over two years. Discussion about the potential need for a CPAP or AutoPAP due to weight loss and changes in condition. She expressed interest in a surgically implanted device but was informed that it requires a sleep specialist and surgery, which she is not interested in pursuing. - Schedule appointment to review BiPAP machine and settings - Advise her to bring BiPAP machine and accessories to the next appointment - scheduled for next week Antiphospholipid antibody syndrome (HCC) Chronic anticoagulation management for antiphospholipid antibody syndrome. No bleeding issues reported. Long-term anticoagulation is necessary. - Continue current anticoagulation regimen Right shoulder pain, unspecified chronicity Prior severe pain and limited mobility in the right shoulder due to calcific bursitis and rotator cuff calcification. Recent improvement after treatment with steroids and pain management. - Advise follow-up with orthopedist for ongoing management Cyst of eyelid, unspecified laterality Recurrent tiny cysts/skin tags on bilateral eyelids. Previous excision noted, with new growths present. - Provided contact information for [facility] for follow-up Routine adult health maintenance - Plan for hep B surface antigen at appointment next week - reordering cologuard as prior sample inadequate Abnormal brain MRI Provided contact info for [facility] as this was where last referral was sent Unfortunately was  dismissed from [facility] due to missed appts    Chrysa Rampy J. Donah, MD [facility] "

## 2023-12-08 NOTE — Assessment & Plan Note (Signed)
 Continued management of morbid obesity. Insurance does not cover obesity medications, and she plans to change her insurance plan during open enrollment. Previous prescription for Saxenda  expired, and coupon issues were noted. - Sent new prescription for Saxenda  - Advised her to revisit the website for the coupon

## 2023-12-08 NOTE — Assessment & Plan Note (Signed)
 Obstructive sleep apnea confirmed by sleep study. She has a BiPAP machine but has not used it in over two years. Discussion about the potential need for a CPAP or AutoPAP due to weight loss and changes in condition. She expressed interest in a surgically implanted device but was informed that it requires a sleep specialist and surgery, which she is not interested in pursuing. - Schedule appointment to review BiPAP machine and settings - Advise her to bring BiPAP machine and accessories to the next appointment - scheduled for next week

## 2023-12-08 NOTE — Assessment & Plan Note (Signed)
 Chronic anticoagulation management for antiphospholipid antibody syndrome. No bleeding issues reported. Long-term anticoagulation is necessary. - Continue current anticoagulation regimen

## 2023-12-08 NOTE — Assessment & Plan Note (Signed)
 Provided contact info for [facility] as this was where last referral was sent Unfortunately was dismissed from [facility] due to missed appts

## 2023-12-08 NOTE — Assessment & Plan Note (Signed)
-   Plan for hep B surface antigen at appointment next week - reordering cologuard as prior sample inadequate

## 2023-12-08 NOTE — Assessment & Plan Note (Signed)
 Refilled effient , encouraged follow up with cardiology

## 2023-12-15 ENCOUNTER — Encounter: Payer: Self-pay | Admitting: Family Medicine

## 2023-12-15 ENCOUNTER — Ambulatory Visit (INDEPENDENT_AMBULATORY_CARE_PROVIDER_SITE_OTHER): Admitting: Family Medicine

## 2023-12-15 VITALS — BP 126/83 | HR 62 | Ht 69.0 in | Wt 290.4 lb

## 2023-12-15 DIAGNOSIS — L219 Seborrheic dermatitis, unspecified: Secondary | ICD-10-CM

## 2023-12-15 DIAGNOSIS — L729 Follicular cyst of the skin and subcutaneous tissue, unspecified: Secondary | ICD-10-CM

## 2023-12-15 DIAGNOSIS — G4733 Obstructive sleep apnea (adult) (pediatric): Secondary | ICD-10-CM | POA: Diagnosis not present

## 2023-12-15 NOTE — Progress Notes (Signed)
  Date of Visit: 12/15/2023   SUBJECTIVE:   HPI:  Discussed the use of AI scribe software for clinical note transcription with the patient, who gave verbal consent to proceed.  History of Present Illness Angelica Brown is a 48 year old female who presents for evaluation of her CPAP machine and a recurring cyst.  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 for approximately two years - Cyst intermittently becomes puffy and sore - Self-drains cyst with a needle, resulting in pinkish discharge, sometimes blood or pus - Cyst resolves temporarily after drainage but recurs - No prior dermatological evaluation  Facial dermatosis - Facial flaking present - A friend suggested the appearance may resemble a 'butterfly' rash - No prior dermatological evaluation for this concern, desires to see derm    OBJECTIVE:   BP 126/83   Pulse 62   Ht 5' 9 (1.753 m)   Wt 290 lb 6.4 oz (131.7 kg)   SpO2 97%   BMI 42.88 kg/m  Gen: no acute distress, pleasant cooperative well appearing HEENT: normocephalic, atraumatic  Heart: regular rate and rhythm, no murmur Lungs: clear to auscultation bilaterally, normal work of breathing  Neuro: alert speech normal grossly nonfocal Skin: small area of 1cm superficial skin cyst in R groin, no induration or fluctuance today, nontender to palpation  ASSESSMENT/PLAN:   Assessment & Plan OSA (obstructive sleep apnea) She experiences discomfort with BiPAP, affecting compliance. Weight loss may require BiPAP adjustment. Interested in alternative treatments, including implant - Refer to sleep specialist at [facility] Dr. Ole Muzzy  for further evaluation and management as patient now lives in [place] - Discuss potential alternative treatments, including the implant, with the sleep specialist. Seborrheic dermatitis Facial dermatitis with flakiness and dryness, suspect seborrheic dermatitis, managed with Vaseline. No dermatology consultation yet. - Refer to dermatology for evaluation and management. Skin cyst Recurrent cyst in groin, self-drained with needles, risking infection and scarring. - Advise against self-draining the cyst with needles due to risk of infection and scarring. - Recommend using hot compresses to aid drainage. - Seek medical attention if the cyst becomes significantly inflamed or painful.   [place] J. Donah, MD [facility]

## 2023-12-15 NOTE — Patient Instructions (Signed)
 It was great to see you again today.  Referring to sleep specialist & dermatology  Be well, Dr. Donah

## 2023-12-17 NOTE — Assessment & Plan Note (Signed)
 She experiences discomfort with BiPAP, affecting compliance. Weight loss may require BiPAP adjustment. Interested in alternative treatments, including implant - Refer to sleep specialist at [facility] Dr. Ole Muzzy for further evaluation and management as patient now lives in [place] - Discuss potential alternative treatments, including the implant, with the sleep specialist.

## 2023-12-17 NOTE — Assessment & Plan Note (Signed)
 Facial dermatitis with flakiness and dryness, suspect seborrheic dermatitis, managed with Vaseline. No dermatology consultation yet. - Refer to dermatology for evaluation and management.

## 2024-01-17 ENCOUNTER — Other Ambulatory Visit: Payer: Self-pay

## 2024-01-17 ENCOUNTER — Emergency Department (HOSPITAL_BASED_OUTPATIENT_CLINIC_OR_DEPARTMENT_OTHER): Admitting: Radiology

## 2024-01-17 ENCOUNTER — Emergency Department (HOSPITAL_BASED_OUTPATIENT_CLINIC_OR_DEPARTMENT_OTHER)
Admission: EM | Admit: 2024-01-17 | Discharge: 2024-01-17 | Disposition: A | Discharge status: Home / Self Care | Attending: Emergency Medicine | Admitting: Emergency Medicine

## 2024-01-17 DIAGNOSIS — J181 Lobar pneumonia, unspecified organism: Secondary | ICD-10-CM | POA: Diagnosis not present

## 2024-01-17 DIAGNOSIS — Z79899 Other long term (current) drug therapy: Secondary | ICD-10-CM | POA: Diagnosis not present

## 2024-01-17 DIAGNOSIS — I517 Cardiomegaly: Secondary | ICD-10-CM | POA: Diagnosis not present

## 2024-01-17 DIAGNOSIS — J189 Pneumonia, unspecified organism: Secondary | ICD-10-CM

## 2024-01-17 DIAGNOSIS — R0602 Shortness of breath: Secondary | ICD-10-CM | POA: Diagnosis not present

## 2024-01-17 DIAGNOSIS — F17210 Nicotine dependence, cigarettes, uncomplicated: Secondary | ICD-10-CM | POA: Diagnosis not present

## 2024-01-17 DIAGNOSIS — Z8616 Personal history of COVID-19: Secondary | ICD-10-CM | POA: Insufficient documentation

## 2024-01-17 DIAGNOSIS — R918 Other nonspecific abnormal finding of lung field: Secondary | ICD-10-CM | POA: Diagnosis not present

## 2024-01-17 DIAGNOSIS — I110 Hypertensive heart disease with heart failure: Secondary | ICD-10-CM | POA: Diagnosis not present

## 2024-01-17 DIAGNOSIS — I5022 Chronic systolic (congestive) heart failure: Secondary | ICD-10-CM | POA: Diagnosis not present

## 2024-01-17 LAB — CBC
HCT: 38.7 % (ref 36.0–46.0)
Hemoglobin: 13.8 g/dL (ref 12.0–15.0)
MCH: 29.6 pg (ref 26.0–34.0)
MCHC: 35.7 g/dL (ref 30.0–36.0)
MCV: 82.9 fL (ref 80.0–100.0)
Platelets: 326 K/uL (ref 150–400)
RBC: 4.67 MIL/uL (ref 3.87–5.11)
RDW: 14.7 % (ref 11.5–15.5)
WBC: 13.1 K/uL — ABNORMAL HIGH (ref 4.0–10.5)
nRBC: 0 % (ref 0.0–0.2)

## 2024-01-17 LAB — BASIC METABOLIC PANEL WITH GFR
Anion gap: 12 (ref 5–15)
BUN: 8 mg/dL (ref 6–20)
CO2: 24 mmol/L (ref 22–32)
Calcium: 9.7 mg/dL (ref 8.9–10.3)
Chloride: 102 mmol/L (ref 98–111)
Creatinine, Ser: 0.69 mg/dL (ref 0.44–1.00)
GFR, Estimated: >60 mL/min (ref >60)
Glucose, Bld: 89 mg/dL (ref 70–99)
Potassium: 3.5 mmol/L (ref 3.5–5.1)
Sodium: 137 mmol/L (ref 135–145)

## 2024-01-17 MED ORDER — DOXYCYCLINE HYCLATE 100 MG PO CAPS: 100.0000 mg | ORAL_CAPSULE | Freq: Two times a day (BID) | ORAL | 0 refills | Status: AC

## 2024-01-17 MED ORDER — DOXYCYCLINE HYCLATE 100 MG PO TABS
100.0000 mg | ORAL_TABLET | Freq: Once | ORAL | Status: AC
  Administered 2024-01-17: 100 mg via ORAL
  Filled 2024-01-17: qty 1

## 2024-01-17 MED ORDER — KETOROLAC TROMETHAMINE 15 MG/ML IJ SOLN
15.0000 mg | Freq: Once | INTRAMUSCULAR | Status: AC
  Administered 2024-01-17: 15 mg via INTRAVENOUS
  Filled 2024-01-17: qty 1

## 2024-01-17 NOTE — Discharge Instructions (Signed)
 While you are in the emergency room, had a chest x-ray, that showed that you could have a possible pneumonia.  Your white blood cell count was mildly elevated.  We are going to treat you with an antibiotic that should help take care of this.  You may take doxycycline  once in the morning and once in the evening for the next 10 days.  Follow-up with your primary care doctor.  Return to the emergency room if you develop difficulty with breathing, or feeling more short of breath.

## 2024-01-17 NOTE — ED Provider Notes (Signed)
 [place] EMERGENCY DEPARTMENT AT [facility] Provider Note  CSN: [id number] Arrival date & time: 01/17/24 1721  Chief Complaint(s) Shortness of Breath  HPI Angelica Brown is a 48 y.o. female with a history of heart failure who is here today for shortness of breath.  Patient was diagnosed with COVID 2 days ago.  States that she has been having sore throat, cough, myalgias.  Has been compliant with all medications.  She does take blood thinners.   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    acute dural venous sinus thrombosis and right mastoid effusion/notes 05/28/2016   GERD (gastroesophageal reflux disease)    Gestational diabetes    w/all 4 pregnancies   Heart murmur    Hypertension    Metrorrhagia    Migraine    at least twice/month (05/28/2016)   Nystagmus 06/30/2016   OBESITY, MORBID    POSTURAL LIGHTHEADEDNESS    Sickle cell trait    STEMI (ST elevation myocardial infarction) (HCC) 11/09/2017   in [place], [place]  - see records in Care Everywhere   SYNCOPE    Throat pain    TOBACCO DEPENDENCE    Patient Active Problem List   Diagnosis Date Noted   Rash 07/21/2023   Abnormal brain MRI 06/18/2023   Routine adult health maintenance 01/15/2023   Positive RPR test 12/14/2022   Bilateral knee pain 06/09/2022   Post-viral cough syndrome 02/02/2022   Stress 10/07/2021   GERD (gastroesophageal reflux disease) 07/19/2021   Plantar fasciitis of right foot 05/20/2021   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   Tonsillolith 12/09/2020   COVID 09/24/2020   Chronic anticoagulation 09/24/2020    Hypertension 05/27/2020   Lateral epicondylitis 04/30/2020   Prediabetes 11/27/2019   Pelvic pain 08/06/2019   OSA (obstructive sleep apnea) 08/06/2019   Left leg pain 03/22/2019   Acute left-sided low back pain without sciatica 11/21/2018   Left flank pain 11/14/2018   Hx of Inf MI (7/19 Tx with BMS to RCA; stent thrombosis 9/19 tx with DES to RCA; stent thrombosis 04/2018 tx with thrombectomy) 05/04/2018   Bradycardia 04/20/2018   History of MI (myocardial infarction) 04/20/2018   Antiphospholipid antibody syndrome 11/01/2016   Seborrheic dermatitis 10/27/2016   Nystagmus 06/30/2016   Chronic systolic CHF (congestive heart failure) (HCC) 06/13/2016   Acute stress reaction 06/09/2016   Mastoiditis of right side 05/29/2016   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 Authorizing Provider  dabigatran  (PRADAXA ) 150 MG CAPS capsule Take 1 capsule (150 mg total) by mouth every 12 (twelve) hours. 12/06/23   Donah Laymon PARAS, MD  Liraglutide  -Weight Management (SAXENDA ) 18 MG/3ML SOPN Inject 0.6 mg into the skin daily. 12/06/23   Donah Laymon PARAS, MD  metoprolol  tartrate (LOPRESSOR ) 25 MG tablet Take 0.5 tablets (12.5 mg total) by mouth 2 (two) times  daily. 12/06/23   Donah Laymon PARAS, MD  nitroGLYCERIN  (NITROSTAT ) 0.4 MG SL tablet Place 1 tablet (0.4 mg total) under the tongue every 5 (five) minutes x 3 doses as needed for chest pain. 10/26/21   Donah Laymon PARAS, MD  prasugrel  (EFFIENT ) 10 MG TABS tablet Take 1 tablet (10 mg total) by mouth daily. 12/06/23   Donah Laymon PARAS, MD  rosuvastatin  (CRESTOR ) 40 MG tablet Take 1 tablet (40 mg total) by mouth daily. 12/06/23   Donah Laymon PARAS, MD  topiramate  (TOPAMAX ) 50 MG tablet Take 1 tablet (50 mg total) by mouth daily. 12/06/23   Donah Laymon PARAS, MD                                                                                                                                    Past Surgical History Past Surgical History:  Procedure Laterality Date   BARTHOLIN GLAND CYST EXCISION Bilateral 2004   CORONARY THROMBECTOMY N/A 05/04/2018   Procedure: Coronary Thrombectomy;  Surgeon: Claudene Victory ORN, MD;  Location: [facility] INVASIVE CV LAB;  Service: Cardiovascular;  Laterality: N/A;   EXCISIONAL HEMORRHOIDECTOMY  2006   removed a blood clot from one   LEFT HEART CATH AND CORONARY ANGIOGRAPHY N/A 05/04/2018   Procedure: LEFT HEART CATH AND CORONARY ANGIOGRAPHY;  Surgeon: Claudene Victory ORN, MD;  Location: [facility] INVASIVE CV LAB;  Service: Cardiovascular;  Laterality: N/A;   TUBAL LIGATION  2007   WISDOM TOOTH EXTRACTION  1998   Family History Family History  Problem Relation Age of Onset   Breast cancer Mother    Cancer Father        MELANOMA   Diabetes Father    Diabetes Paternal Grandmother    Diabetes Paternal Grandfather    Breast cancer Maternal Aunt    Breast cancer Paternal Aunt    Colon cancer Neg Hx    Esophageal cancer Neg Hx     Social History Social History   Tobacco Use   Smoking status: Every Day    Current packs/day: 0.50    Average packs/day: 0.5 packs/day for 18.0 years (9.0 ttl pk-yrs)    Types: Cigarettes    Passive exposure: Current   Smokeless tobacco: Never   Tobacco comments:    Currently 10 cigs/day  Vaping Use   Vaping status: Never Used  Substance Use Topics   Alcohol use: No   Drug use: No   Allergies Ampicillin , Penicillins, Strawberry extract, Cephalexin, and Prednisone   Review of Systems Review of Systems  Physical Exam Vital Signs  I have reviewed the triage vital signs BP (!) 147/92 (BP Location: Right Arm)   Pulse (!) 56   Temp 97.9 F (36.6 C) (Oral)   Resp 19   Ht 5'  9 (1.753 m)   Wt 133.4 kg   SpO2 100%   BMI 43.42 kg/m   Physical Exam Vitals reviewed.  Constitutional:  General: She is not in acute distress. Pulmonary:     Effort: Pulmonary effort is normal.     Breath sounds: No decreased breath sounds or rales.  Musculoskeletal:     Cervical back: Normal range of motion.     Right lower leg: No edema.     Left lower leg: No edema.  Neurological:     Mental Status: She is alert.     ED Results and Treatments Labs (all labs ordered are listed, but only abnormal results are displayed) Labs Reviewed  CBC - Abnormal; Notable for the following components:      Result Value   WBC 13.1 (*)    All other components within normal limits  BASIC METABOLIC PANEL WITH GFR  PREGNANCY, URINE                                                                                                                          Radiology DG Chest 2 View Result Date: 01/17/2024 CLINICAL DATA:  Shortness of breath. Recent COVID positive diagnosis. EXAM: CHEST - 2 VIEW COMPARISON:  Chest radiograph 03/17/2021 FINDINGS: The heart is borderline enlarged. The cardiomediastinal contours are normal. Patchy opacity at the left lung base. No pleural effusion or pneumothorax. No acute osseous abnormalities are seen. IMPRESSION: Patchy opacity at the left lung base, may be atelectasis or pneumonia. Electronically Signed   By: Andrea Gasman M.D.   On: 01/17/2024 19:03    Pertinent labs & imaging results that were available during my care of the patient were reviewed by me and considered in my medical decision making (see MDM for details).  Medications Ordered in ED Medications  doxycycline  (VIBRA -TABS) tablet 100 mg (has no administration in time range)  ketorolac  (TORADOL ) 15 MG/ML injection 15 mg (15 mg Intravenous Given 01/17/24 1841)                                                                                                                                      Procedures Procedures  (including critical care time)  Medical Decision Making / ED Course   This patient presents to the ED for concern of shortness of breath and recent COVID diagnosis, this involves an extensive number of treatment options, and is a complaint that carries with it a high risk of complications and morbidity.  The differential diagnosis includes COVID-19, viral syndrome, less likely pneumonia, less  likely heart failure exacerbation, less likely ACS.  MDM: Patient with symptoms consistent with COVID-19.  Was seen at an outpatient office and recommended to come here for a chest x-ray.  Given the patient is on 2 blood thinners, I have low suspicion for PE.  Patient does not appear fluid overloaded.  Do not believe she requires a BNP.  Will provide patient with some Toradol  for her symptoms.  Reassessment 7:24 PM-patient's chest x-ray shows possible patchy consolidation in the left lower lobe.  Patient also has a minor white count 13.1.  Given the white count, and questionable x-ray findings, I believe it is prudent to start the patient on antibiotics.  Will send her prescription for doxycycline .  She will follow-up with her PCP.   Additional history obtained:  -External records from outside source obtained and reviewed including: Chart review including previous notes, labs, imaging, consultation notes   Lab Tests: -I ordered, reviewed, and interpreted labs.   The pertinent results include:   Labs Reviewed  CBC - Abnormal; Notable for the following components:      Result Value   WBC 13.1 (*)    All other components within normal limits  BASIC METABOLIC PANEL WITH GFR  PREGNANCY, URINE      EKG my independent review of the patient's EKG shows no ST segment depressions or elevations, no T wave inversions, no evidence of acute ischemia.  EKG Interpretation Date/Time:  Tuesday January 17 2024 17:35:01 EDT Ventricular Rate:  54 PR Interval:  166 QRS  Duration:  80 QT Interval:  444 QTC Calculation: 421 R Axis:   69  Text Interpretation: Sinus rhythm Consider right atrial enlargement Borderline T abnormalities, anterior leads Confirmed by Mannie Pac [phone]) on 01/17/2024 6:13:34 PM         Imaging Studies ordered: I ordered imaging studies including chest x-ray I independently visualized and interpreted imaging. I agree with the radiologist interpretation   Medicines ordered and prescription drug management: Meds ordered this encounter  Medications   ketorolac  (TORADOL ) 15 MG/ML injection 15 mg   doxycycline  (VIBRA -TABS) tablet 100 mg    -I have reviewed the patients home medicines and have made adjustments as needed   Cardiac Monitoring: The patient was maintained on a cardiac monitor.  I personally viewed and interpreted the cardiac monitored which showed an underlying rhythm of: Normal sinus rhythm  Social Determinants of Health:  Factors impacting patients care include: Lack of access to primary care   Reevaluation: After the interventions noted above, I reevaluated the patient and found that they have :improved  Co morbidities that complicate the patient evaluation  Past Medical History:  Diagnosis Date   ACS (acute coronary syndrome) (HCC) 05/04/2018   CHOLELITHIASIS    Chronic systolic CHF (congestive heart failure) (HCC) 06/13/2016   Ischemic CM (inf MI 05/2018) >> Echo 05/04/2018 - Mod LVH, EF 40-45, inf-lat HK, PASP 47 (mild pul HTN)   Coronary artery disease    Dermatophytosis of nail    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    acute dural venous sinus thrombosis and right mastoid effusion/notes 05/28/2016   GERD (gastroesophageal reflux disease)    Gestational diabetes    w/all 4 pregnancies   Heart murmur    Hypertension    Metrorrhagia    Migraine    at least twice/month (05/28/2016)   Nystagmus 06/30/2016   OBESITY, MORBID    POSTURAL LIGHTHEADEDNESS    Sickle cell trait    STEMI (ST  elevation myocardial infarction) (  HCC) 11/09/2017   in [place], [place]  - see records in Care Everywhere   SYNCOPE    Throat pain    TOBACCO DEPENDENCE       Dispostion: I considered admission for this patient, however she is appropriate for outpatient follow-up.     Final Clinical Impression(s) / ED Diagnoses Final diagnoses:  Community acquired pneumonia of left lower lobe of lung     @PCDICTATION @    Mannie Pac T, DO 01/17/24 1928

## 2024-01-17 NOTE — ED Notes (Signed)
Pt unable to provide a sample at this time.

## 2024-01-17 NOTE — ED Triage Notes (Signed)
 Pt POV reporting increased SOB, dx covid 2 days ago. Hx CHF

## 2024-02-10 DIAGNOSIS — R270 Ataxia, unspecified: Secondary | ICD-10-CM | POA: Diagnosis not present

## 2024-02-10 DIAGNOSIS — F17210 Nicotine dependence, cigarettes, uncomplicated: Secondary | ICD-10-CM | POA: Diagnosis not present

## 2024-02-10 DIAGNOSIS — I252 Old myocardial infarction: Secondary | ICD-10-CM | POA: Diagnosis not present

## 2024-02-10 DIAGNOSIS — Z20822 Contact with and (suspected) exposure to covid-19: Secondary | ICD-10-CM | POA: Diagnosis not present

## 2024-02-10 DIAGNOSIS — Z880 Allergy status to penicillin: Secondary | ICD-10-CM | POA: Diagnosis not present

## 2024-02-10 DIAGNOSIS — H938X1 Other specified disorders of right ear: Secondary | ICD-10-CM | POA: Diagnosis not present

## 2024-02-10 DIAGNOSIS — R519 Headache, unspecified: Secondary | ICD-10-CM | POA: Diagnosis not present

## 2024-02-10 DIAGNOSIS — Z955 Presence of coronary angioplasty implant and graft: Secondary | ICD-10-CM | POA: Diagnosis not present

## 2024-02-10 DIAGNOSIS — Z91018 Allergy to other foods: Secondary | ICD-10-CM | POA: Diagnosis not present

## 2024-02-10 DIAGNOSIS — Z86718 Personal history of other venous thrombosis and embolism: Secondary | ICD-10-CM | POA: Diagnosis not present

## 2024-05-28 ENCOUNTER — Ambulatory Visit: Admitting: Family Medicine
